# Patient Record
Sex: Female | Born: 1960 | Race: White | Hispanic: No | Smoking: Former smoker
Health system: Southern US, Community
[De-identification: ages and names within clinical notes are randomized; demographics above are authoritative.]

## PROBLEM LIST (undated history)

## (undated) DIAGNOSIS — M797 Fibromyalgia: Secondary | ICD-10-CM

## (undated) DIAGNOSIS — G4730 Sleep apnea, unspecified: Secondary | ICD-10-CM

## (undated) DIAGNOSIS — D649 Anemia, unspecified: Secondary | ICD-10-CM

## (undated) DIAGNOSIS — I2510 Atherosclerotic heart disease of native coronary artery without angina pectoris: Secondary | ICD-10-CM

## (undated) DIAGNOSIS — F419 Anxiety disorder, unspecified: Secondary | ICD-10-CM

## (undated) DIAGNOSIS — M1990 Unspecified osteoarthritis, unspecified site: Secondary | ICD-10-CM

## (undated) DIAGNOSIS — I10 Essential (primary) hypertension: Secondary | ICD-10-CM

## (undated) DIAGNOSIS — I509 Heart failure, unspecified: Secondary | ICD-10-CM

## (undated) DIAGNOSIS — K219 Gastro-esophageal reflux disease without esophagitis: Secondary | ICD-10-CM

## (undated) DIAGNOSIS — J449 Chronic obstructive pulmonary disease, unspecified: Secondary | ICD-10-CM

## (undated) DIAGNOSIS — E119 Type 2 diabetes mellitus without complications: Secondary | ICD-10-CM

## (undated) DIAGNOSIS — I639 Cerebral infarction, unspecified: Secondary | ICD-10-CM

## (undated) DIAGNOSIS — I739 Peripheral vascular disease, unspecified: Secondary | ICD-10-CM

## (undated) HISTORY — DX: Chronic obstructive pulmonary disease, unspecified: J44.9

## (undated) HISTORY — DX: Atherosclerotic heart disease of native coronary artery without angina pectoris: I25.10

## (undated) HISTORY — DX: Unspecified osteoarthritis, unspecified site: M19.90

## (undated) HISTORY — DX: Heart failure, unspecified: I50.9

## (undated) HISTORY — PX: DILATION AND CURETTAGE OF UTERUS: SHX78

## (undated) HISTORY — DX: Gastro-esophageal reflux disease without esophagitis: K21.9

## (undated) HISTORY — DX: Anxiety disorder, unspecified: F41.9

## (undated) HISTORY — DX: Cerebral infarction, unspecified: I63.9

## (undated) HISTORY — DX: Fibromyalgia: M79.7

## (undated) HISTORY — DX: Sleep apnea, unspecified: G47.30

## (undated) HISTORY — DX: Anemia, unspecified: D64.9

## (undated) HISTORY — DX: Essential (primary) hypertension: I10

## (undated) HISTORY — DX: Type 2 diabetes mellitus without complications: E11.9

## (undated) HISTORY — PX: TUBAL LIGATION: SHX77

## (undated) HISTORY — PX: CORONARY ANGIOPLASTY WITH STENT PLACEMENT: SHX49

## (undated) HISTORY — DX: Peripheral vascular disease, unspecified: I73.9

---

## 2004-08-08 ENCOUNTER — Other Ambulatory Visit: Payer: Self-pay

## 2004-08-08 ENCOUNTER — Inpatient Hospital Stay: Payer: Self-pay | Admitting: Internal Medicine

## 2004-08-09 ENCOUNTER — Other Ambulatory Visit: Payer: Self-pay

## 2004-11-10 ENCOUNTER — Ambulatory Visit: Payer: Self-pay

## 2007-02-10 ENCOUNTER — Emergency Department: Payer: Self-pay | Admitting: Emergency Medicine

## 2007-02-10 ENCOUNTER — Other Ambulatory Visit: Payer: Self-pay

## 2008-03-10 ENCOUNTER — Ambulatory Visit: Payer: Self-pay | Admitting: Gastroenterology

## 2008-08-13 ENCOUNTER — Emergency Department: Payer: Self-pay | Admitting: Internal Medicine

## 2008-10-06 ENCOUNTER — Inpatient Hospital Stay: Payer: Self-pay | Admitting: Internal Medicine

## 2008-12-04 ENCOUNTER — Ambulatory Visit: Payer: Self-pay | Admitting: Family Medicine

## 2010-07-21 ENCOUNTER — Ambulatory Visit: Payer: Self-pay | Admitting: Family Medicine

## 2014-01-24 ENCOUNTER — Emergency Department: Payer: Self-pay | Admitting: Emergency Medicine

## 2014-01-24 LAB — COMPREHENSIVE METABOLIC PANEL
ALT: 27 U/L (ref 12–78)
Albumin: 3.3 g/dL — ABNORMAL LOW (ref 3.4–5.0)
Alkaline Phosphatase: 94 U/L
Anion Gap: 9 (ref 7–16)
BUN: 14 mg/dL (ref 7–18)
Bilirubin,Total: 0.2 mg/dL (ref 0.2–1.0)
CO2: 27 mmol/L (ref 21–32)
Calcium, Total: 9.1 mg/dL (ref 8.5–10.1)
Chloride: 102 mmol/L (ref 98–107)
Creatinine: 0.71 mg/dL (ref 0.60–1.30)
EGFR (African American): >60
Glucose: 224 mg/dL — ABNORMAL HIGH (ref 65–99)
Osmolality: 283 (ref 275–301)
POTASSIUM: 3.9 mmol/L (ref 3.5–5.1)
SGOT(AST): 14 U/L — ABNORMAL LOW (ref 15–37)
Sodium: 138 mmol/L (ref 136–145)
TOTAL PROTEIN: 7.8 g/dL (ref 6.4–8.2)

## 2014-01-24 LAB — URINALYSIS, COMPLETE
BACTERIA: NONE SEEN
Bilirubin,UR: NEGATIVE
Blood: NEGATIVE
Glucose,UR: NEGATIVE mg/dL (ref 0–75)
Ketone: NEGATIVE
Leukocyte Esterase: NEGATIVE
Nitrite: NEGATIVE
Ph: 5 (ref 4.5–8.0)
Protein: NEGATIVE
RBC,UR: <1 /HPF (ref 0–5)
Specific Gravity: 1.006 (ref 1.003–1.030)
Squamous Epithelial: 1
WBC UR: 1 /HPF (ref 0–5)

## 2014-01-24 LAB — CBC
HCT: 36.8 % (ref 35.0–47.0)
HGB: 11.9 g/dL — ABNORMAL LOW (ref 12.0–16.0)
MCH: 26.3 pg (ref 26.0–34.0)
MCHC: 32.3 g/dL (ref 32.0–36.0)
MCV: 82 fL (ref 80–100)
Platelet: 180 10*3/uL (ref 150–440)
RBC: 4.5 10*6/uL (ref 3.80–5.20)
RDW: 16.4 % — ABNORMAL HIGH (ref 11.5–14.5)
WBC: 10.2 10*3/uL (ref 3.6–11.0)

## 2014-02-18 ENCOUNTER — Ambulatory Visit: Payer: Self-pay | Admitting: Primary Care

## 2014-03-17 ENCOUNTER — Ambulatory Visit: Payer: Self-pay | Admitting: Vascular Surgery

## 2014-03-17 LAB — BASIC METABOLIC PANEL
Anion Gap: 6 — ABNORMAL LOW (ref 7–16)
BUN: 32 mg/dL — AB (ref 7–18)
CALCIUM: 9.6 mg/dL (ref 8.5–10.1)
CHLORIDE: 103 mmol/L (ref 98–107)
Co2: 30 mmol/L (ref 21–32)
Creatinine: 1.61 mg/dL — ABNORMAL HIGH (ref 0.60–1.30)
EGFR (African American): 42 — ABNORMAL LOW
GFR CALC NON AF AMER: 36 — AB
Glucose: 202 mg/dL — ABNORMAL HIGH (ref 65–99)
Osmolality: 290 (ref 275–301)
Potassium: 4.1 mmol/L (ref 3.5–5.1)
Sodium: 139 mmol/L (ref 136–145)

## 2014-03-19 ENCOUNTER — Ambulatory Visit: Payer: Self-pay | Admitting: Vascular Surgery

## 2014-08-08 ENCOUNTER — Inpatient Hospital Stay: Payer: Self-pay | Admitting: Internal Medicine

## 2014-08-08 LAB — COMPREHENSIVE METABOLIC PANEL
ALBUMIN: 3.1 g/dL — AB (ref 3.4–5.0)
ALK PHOS: 87 U/L
ALT: 27 U/L
Anion Gap: 11 (ref 7–16)
BUN: 24 mg/dL — ABNORMAL HIGH (ref 7–18)
Bilirubin,Total: 0.2 mg/dL (ref 0.2–1.0)
CO2: 26 mmol/L (ref 21–32)
CREATININE: 1.22 mg/dL (ref 0.60–1.30)
Calcium, Total: 8.1 mg/dL — ABNORMAL LOW (ref 8.5–10.1)
Chloride: 100 mmol/L (ref 98–107)
EGFR (African American): 59 — ABNORMAL LOW
GFR CALC NON AF AMER: 49 — AB
GLUCOSE: 358 mg/dL — AB (ref 65–99)
Osmolality: 292 (ref 275–301)
POTASSIUM: 4.3 mmol/L (ref 3.5–5.1)
SGOT(AST): 21 U/L (ref 15–37)
SODIUM: 137 mmol/L (ref 136–145)
Total Protein: 6.9 g/dL (ref 6.4–8.2)

## 2014-08-08 LAB — PROTIME-INR
INR: 1.1
Prothrombin Time: 13.9 secs (ref 11.5–14.7)

## 2014-08-08 LAB — CBC
HCT: 33.6 % — AB (ref 35.0–47.0)
HGB: 10.8 g/dL — ABNORMAL LOW (ref 12.0–16.0)
MCH: 26.8 pg (ref 26.0–34.0)
MCHC: 32.1 g/dL (ref 32.0–36.0)
MCV: 84 fL (ref 80–100)
PLATELETS: 171 10*3/uL (ref 150–440)
RBC: 4.02 10*6/uL (ref 3.80–5.20)
RDW: 15.7 % — ABNORMAL HIGH (ref 11.5–14.5)
WBC: 9.7 10*3/uL (ref 3.6–11.0)

## 2014-08-09 LAB — BASIC METABOLIC PANEL
Anion Gap: 6 — ABNORMAL LOW (ref 7–16)
BUN: 18 mg/dL (ref 7–18)
CHLORIDE: 105 mmol/L (ref 98–107)
CO2: 29 mmol/L (ref 21–32)
CREATININE: 1.14 mg/dL (ref 0.60–1.30)
Calcium, Total: 8 mg/dL — ABNORMAL LOW (ref 8.5–10.1)
EGFR (Non-African Amer.): 53 — ABNORMAL LOW
GLUCOSE: 138 mg/dL — AB (ref 65–99)
Osmolality: 283 (ref 275–301)
Potassium: 3.7 mmol/L (ref 3.5–5.1)
SODIUM: 140 mmol/L (ref 136–145)

## 2014-08-09 LAB — HEMOGLOBIN
HGB: 10.6 g/dL — ABNORMAL LOW (ref 12.0–16.0)
HGB: 10.8 g/dL — AB (ref 12.0–16.0)

## 2014-08-09 LAB — MAGNESIUM: Magnesium: 1.2 mg/dL — ABNORMAL LOW

## 2014-12-01 ENCOUNTER — Ambulatory Visit: Payer: Self-pay | Admitting: Primary Care

## 2014-12-11 ENCOUNTER — Ambulatory Visit: Payer: Self-pay | Admitting: Primary Care

## 2014-12-29 ENCOUNTER — Ambulatory Visit
Admit: 2014-12-29 | Disposition: A | Discharge status: Home / Self Care | Payer: Self-pay | Attending: Vascular Surgery | Admitting: Vascular Surgery

## 2014-12-30 DIAGNOSIS — G4733 Obstructive sleep apnea (adult) (pediatric): Secondary | ICD-10-CM

## 2015-01-17 NOTE — Discharge Summary (Signed)
PATIENT NAME:  [name], VOORHIES MR#:  [id number] DATE OF BIRTH:  1960-10-13  DATE OF ADMISSION:  08/08/2014 DATE OF DISCHARGE:  08/09/2014  ADMITTING PHYSICIAN: Dr. Bridgett Larsson  DISCHARGING PHYSICIAN: Gladstone Lighter, MD  PRIMARY CARE PHYSICIAN: Ms. Freddy Finner  DISCHARGE DIAGNOSES:  1.  Rectal bleed, likely hemorrhoidal. 2.  Hypertension. 3.  Diabetes.  4.  Obstructive sleep apnea, on CPAP.  5.  Morbid obesity. 6.  Hypomagnesemia.  DISCHARGE HOME MEDICATIONS: 1.  Januvia 100 mg p.o. daily.  2.  Aspirin 81 mg p.o. daily.  3.  Lasix 40 mg p.o. b.i.d.  4.  Metformin 1000 mg p.o. b.i.d.  5.  Plavix 75 mg p.o. daily. 6.  Cetirizine 10 mg p.o. daily.  7.  Vitamin D3 1,000 international units 1 capsule daily. 8.  Enalapril 20 mg p.o. daily.  9.  Protonix 40 mg daily.  10.  Lantus 80 units subcutaneous once a day in the evening. 11.  Glipizide 10 mg p.o. b.i.d.  12.  Simvastatin 40 mg p.o. daily.  13.  Coreg 12.5 mg p.o. b.i.d.  14.  Sertraline 100 mg 1-1/2 tablets daily.  15.  Flexeril 10 mg p.o. at bedtime as needed for back pain.  16.  Trazodone 50 mg 1 to 2 tablets orally at bedtime as needed for sleep.  17.  Fluconazole 150 mg once a week on Tuesdays for 8 weeks. 18.  Colace 100 mg p.o. b.i.d. for constipation.   DISCHARGE DIET: Low-sodium, ADA 1800 calorie.  DISCHARGE ACTIVITY: As tolerated.    FOLLOWUP INSTRUCTIONS: 1.  Follow up with GI, Dr. Allen Norris, in 1 week.  2.  PCP follow-up in 1 to 2 weeks.   DIAGNOSTIC DATA: Prior to discharge:  Hemoglobin has remained stable at 10.8, hematocrit 33.6, WBC 9.7, platelet count 171,000. Sodium 140, potassium 3.7, chloride 105, bicarb 29, BUN 18, creatinine 1.14, glucose 158, and calcium 8.   ALT 27, AST 21, alk phos 87, total bili 0.2, albumin 3.1. INR 1.1.   BRIEF HOSPITAL COURSE:  1.  [name] is a 54 year old obese Caucasian female with past medical history significant for hypertension, diabetes, and sleep apnea who came to the  hospital secondary to 2 days of history of rectal bleeding. The patient says she noticed blood on the toilet paper and also blood around the stool, not mixed inside the stool. Quantifies the  amount as only small amounts of blood. Her last colonoscopy was in 2009 which showed colitis at the time and was treated. The patient was admitted. She has not had further bloody bowel movements. She also complains of constipation recently. She is on stool softeners at this time in the hospital. Discussed the case with Dr. Allen Norris who would like to see the patient as an outpatient in 1 week. The patient really wanted to go home as well too. Hemoglobin has remained stable so likely could have been hemorrhoidal bleed or diverticulosis, but since bleeding stopped, since hemoglobin is stable, she is being discharged with outpatient follow-up. Stool softener is recommended at the time of discharge. 2.  Diabetes mellitus. The patient is on Januvia. The patient is on Lantus and glipizide which will be continued without any changes.   3.  Hypertension. The patient is on Lasix, on enalapril, and Coreg. 4.  Fibromyalgia. Continued her on her home pain medications.  Her course has been otherwise uneventful in the hospital.   DISCHARGE CONDITION: Stable.   DISCHARGE DISPOSITION: Home.   TIME SPENT ON DISCHARGE: 40 minutes.  ____________________________ Gladstone Lighter, MD rk:sb D: 08/11/2014 12:35:57 ET T: 08/11/2014 13:51:09 ET JOB#: [id number]  cc: Gladstone Lighter, MD, <Dictator> Lucilla Lame, MD Freddy Finner, NP  Gladstone Lighter MD ELECTRONICALLY SIGNED 08/16/2014 15:02

## 2015-01-17 NOTE — H&P (Signed)
PATIENT NAME:  [name], [name] MR#:  [id number] DATE OF BIRTH:  12-May-1961  DATE OF ADMISSION:  08/08/2014  PRIMARY CARE PHYSICIAN: Dr. Seward Carolubio.   CHIEF COMPLAINT: Rectal bleeding for 7 to 10 days.   HISTORY OF PRESENT ILLNESS: A 54 year old Caucasian female with a history of hypertension, diabetes, obesity, came to ED for rectal bleeding for the past 7 to 10 days. The patient is alert, awake and oriented, in no acute distress. The patient said that for the past 7 to 10 days she has had bright bloody stool on and off. She denies any melena or hematuria. The patient feels weak, but denies any chest pain, palpitation. The patient is taking aspirin, Plavix, but denies any NSAID intake.   PAST MEDICAL HISTORY: Hypertension, diabetes, OSA on CPAP, morbid obesity.   SOCIAL HISTORY: Quit smoking this June. No alcohol drinking. No illicit drugs.   FAMILY HISTORY: Hypertension, diabetes.   ALLERGIES: None.   HOME MEDICATIONS: Aspirin 81 mg p.o. daily, Coreg 12.5 mg p.o. b.i.d., cetirizine 10 mg p.o. daily, Plavix 75 mg p.o. daily, cyclobenzaprine 10 mg p.o. at bedtime p.r.n. for back pain, diclofenac sodium 75 mg p.o. twice a day p.r.n. for pain, enalapril 20 mg p.o. daily, fluconazole 150 mg p.o. once a week on Tuesday for 8 weeks, Lasix 40 mg p.o. b.i.d., glipizide 10 mg p.o. b.i.d., Januvia 100 mg p.o. once a day, Lantus 80 units subcutaneous in the evening, metformin 1000 mg p.o. b.i.d., Protonix 40 mg p.o. daily, sertraline 100 mg p.o. 1.5 tablets once a day, Zocor 40 mg p.o. at bedtime, trazodone 50 mg p.o. 1 to 2 tablets at bedtime p.r.n. for sleep, vitamin D3 at 1000 international units 1 cap once a day.   REVIEW OF SYSTEMS:  CONSTITUTIONAL: The patient denies any fever or chills. No headache. No dizziness, but has generalized weakness.  EYES: No double vision or blurred vision.  ENT: No potential drip, slurred speech or dysphagia.  CARDIOVASCULAR: No chest pain, palpitation, orthopnea, nocturnal  dyspnea. No leg edema.  PULMONARY: No cough, sputum, shortness of breath, or hemoptysis.  GASTROINTESTINAL: No abdominal pain, nausea, vomiting, but has diarrhea and bloody stool. Denies any melena.  GENITOURINARY: No dysuria, hematuria, or incontinence.  SKIN: No rash or jaundice.  NEUROLOGIC: No syncope, loss of consciousness, or seizure.  ENDOCRINOLOGY: No polyuria, polydipsia, heat or cold intolerance.  HEMATOLOGY: No easy bruising or bleeding, but has rectal bleeding.    PHYSICAL EXAMINATION: VITAL SIGNS: Temperature 98.1, blood pressure 110/74, pulse 88, oxygen saturation 100% on room air.  GENERAL: The patient is alert, awake, oriented, in no acute distress, obese.  HEENT: Pupils round, equal, and reactive to light and accommodation.  NECK: Supple. No JVD or carotid bruit. No lymphadenopathy. No thyromegaly.  CARDIOVASCULAR: S1, S2 regular rate and rhythm. No murmurs or gallops.  PULMONARY: Bilateral air entry. No wheezing or rales. No use of accessory muscle to breathe.  ABDOMEN: Soft. No distention or tenderness. No organomegaly. Bowel sounds present. Obese.  EXTREMITIES: No edema, clubbing or cyanosis. No calf tenderness. Bilateral pedal pulses present.  SKIN: No rash or jaundice.  NEUROLOGIC: A and O x 3. No focal deficit. Power 5/5. Sensory intact.   LABORATORY DATA: WBC 9.7, hemoglobin 10.8 and was 11.9 in May 2015, platelets 171,000. Glucose 358, BUN 24, creatinine 1.22. Electrolytes normal. INR 1.1.   IMPRESSIONS:  1.  Rectal bleeding.  2.  Anemia.  3.  Hypertension.  4.  Diabetes.  5.  Morbid obesity.  6.  Obstructive sleep apnea, on CPAP.   PLAN OF TREATMENT:  1.  The patient will be admitted to the medical floor. We will hold aspirin, Plavix, and diclofenac. Monitor hemoglobin every 8 hours. We will start Protonix IV b.i.d. and follow up a GI consult for possible endoscopy.  2.  We will keep n.p.o. except the medications.  3.  For hypertension, continue the  patient's home p.o. medication.  4.  For diabetes, we will hold glipizide and Januvia, start a sliding scale, and give Levemir 40 units subcutaneous in the evening, which is a half dose of the patient's home Lantus dose. Will monitor blood sugar closely and start hypoglycemia protocol.  5.  IV fluid support.  6.  I discussed the patient's condition and plan of treatment with the patient.   TIME SPENT: About 32 minutes.    ____________________________ Shaune Pollack, MD qc:at D: 08/08/2014 20:39:05 ET T: 08/08/2014 21:12:44 ET JOB#: [id number]  cc: Shaune Pollack, MD, <Dictator> Shaune Pollack MD ELECTRONICALLY SIGNED 08/11/2014 23:35

## 2015-01-17 NOTE — Op Note (Signed)
PATIENT NAME:  [name], [name] MR#:  [id number] DATE OF BIRTH:  19-Feb-1961  DATE OF PROCEDURE:  03/19/2014  PREOPERATIVE DIAGNOSES:  1. Bilateral carotid artery stenosis.  2. 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  3. Chronic kidney disease.  4. Morbid obesity.  5. Hypertension.  6. Diabetes.   POSTOPERATIVE DIAGNOSES:  1. Bilateral carotid artery stenosis.  2. 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  3. Chronic kidney disease.  4. Morbid obesity.  5. Hypertension.  6. Diabetes.   PROCEDURES:  1. Ultrasound guidance for vascular access to right femoral artery.  2. Catheter placement into right subclavian artery, right common carotid artery, and left common carotid artery from right femoral approach.  3. Thoracic aortogram.  4. Selective left cervical and cerebral carotid angiogram.  5. Selective right cervical carotid angiogram.  6. Right subclavian angiogram.  7. Percutaneous transluminal angioplasty of proximal right subclavian artery for high-grade stenosis with 6, 7, and 8 mm diameter angioplasty balloons.  8. StarClose closure of  right femoral artery.   SURGEON: Annice Needy, MD    ANESTHESIA: Local with moderate conscious sedation.   BLOOD LOSS: 25 mL.   CONTRAST USED: 100 mL Visipaque.   FLUOROSCOPY TIME: 11.4 minutes.   INDICATION FOR PROCEDURE: This is a 54 year old female with the above-mentioned issues. She was referred with an ultrasound.  She has a CKD which precludes CT angiogram, and given her size and the subclavian lesion, this would be difficult to assess with CT angiogram. Catheter-based angiogram is performed for further evaluation. Risks and benefits were discussed. Informed consent was obtained.   DESCRIPTION OF PROCEDURE: The patient is brought to the vascular suite. Groins were shaved and prepped and a sterile surgical field was created. The right femoral artery was visualized with ultrasound. The right  femoral head was localized with fluoroscopy. I accessed the right femoral artery under direct ultrasound guidance without difficulty with a Seldinger needle and a permanent image was recorded. A 5-French sheath was then placed and the patient was given 4000 units of intravenous heparin. An additional 2000 units were given later in the procedure. Pigtail catheter was placed in the ascending aorta and an LAO projection thoracic aortogram was performed. This showed normal left subclavian artery with a small vertebral artery. Normal origin of the left common carotid artery. The innominate artery showed brisk flow into the carotid artery, but the right subclavian artery filled late and appeared to be through a retrograde filling of a small vertebral artery. I  started by cannulating the left common carotid artery with a Headhunter catheter and advancing into the mid left common carotid artery. Cervical and cerebral carotid angiogram was then performed. Multiple views were performed to help delineate the degree of stenosis and about a 70% to 75% left ICA stenosis just beyond its origin was seen. The intracranial filling was brisk to both the anterior and middle cerebral arteries without significant deficits. I then cannulated the innominate artery with Headhunter catheter. I advanced into the right common carotid artery and selective imaging was performed of the cervical carotid artery, which showed only about a 25% to 30% stenosis of the cervical internal carotid artery. The catheter was pulled back in the innominate artery. We were able to identify very high-grade stenosis of the right subclavian artery at its origin, just beyond the origin of the common carotid artery. This was greater than 90%. I was able to cross the lesion with a mild amount of difficulty with  a stiff-angled Glidewire and the Headhunter catheter and confirm intraluminal flow in the subclavian and axillary artery distal to the lesion.  I performed  pressure measurements of the right subclavian artery.  It had pressures only in the 50s to 60s systolic over 30 diastolic.  The pressures, when pulled back into the innominate artery and then when measured off a sheath after its placement, were in the 130s systolic over 60 diastolic range. There was a 60 mm to 70 mm mercury gradient. At this point, a 6-French 70 cm long sheath was placed into the innominate artery and a Terumo Advantage wire had been left across the lesion after catheter placement and removal. Through this sheath, I treated the lesion initially with a 6 mm diameter angioplasty balloon. There was still significant residual stenosis.  This was in a location that was suboptimal for stent placement. The lesion was just beyond the origin of the carotid artery and there was only about 2 cm until the origin of the vertebral artery. There was also poststenotic dilatation in the subclavian artery making size match difficult.  I proceeded to perform angioplasty with a 7 mm and then an 8 mm diameter angioplasty balloon, and at this point, there was markedly improved flow with only about a 30% residual stenosis seen. I was pleased with this result and elected not to place a stent. The sheath was then pulled back to the ipsilateral external iliac artery. An oblique arteriogram was performed. StarClose closure device was deployed in the usual fashion with excellent hemostatic result. The patient tolerated the procedure well and was taken to the recovery room in stable condition.    ____________________________ Annice NeedyJason S. Dew, MD jsd:dd/am D: 03/19/2014 15:34:09 ET T: 03/20/2014 02:05:07 ET JOB#: [id number]  cc: Annice NeedyJason S. Dew, MD, <Dictator> Sandrea HughsJessica Rubio, NP  Annice NeedyJASON S DEW MD ELECTRONICALLY SIGNED 03/31/2014 15:07

## 2015-01-22 ENCOUNTER — Ambulatory Visit: Payer: Self-pay

## 2015-01-22 ENCOUNTER — Ambulatory Visit
Admit: 2015-01-22 | Disposition: A | Discharge status: Home / Self Care | Payer: Self-pay | Attending: Vascular Surgery | Admitting: Vascular Surgery

## 2015-01-22 LAB — BASIC METABOLIC PANEL
Anion Gap: 7 (ref 7–16)
BUN: 12 mg/dL
CHLORIDE: 101 mmol/L
Calcium, Total: 9.2 mg/dL
Co2: 29 mmol/L
Creatinine: 0.81 mg/dL
EGFR (Non-African Amer.): >60
Glucose: 171 mg/dL — ABNORMAL HIGH
Potassium: 4.2 mmol/L
SODIUM: 137 mmol/L

## 2015-01-22 LAB — CBC
HCT: 31.9 % — ABNORMAL LOW (ref 35.0–47.0)
HGB: 10.1 g/dL — ABNORMAL LOW (ref 12.0–16.0)
MCH: 24.3 pg — AB (ref 26.0–34.0)
MCHC: 31.7 g/dL — ABNORMAL LOW (ref 32.0–36.0)
MCV: 77 fL — ABNORMAL LOW (ref 80–100)
PLATELETS: 191 10*3/uL (ref 150–440)
RBC: 4.16 10*6/uL (ref 3.80–5.20)
RDW: 16.1 % — ABNORMAL HIGH (ref 11.5–14.5)
WBC: 10.2 10*3/uL (ref 3.6–11.0)

## 2015-01-22 LAB — PROTIME-INR
INR: 1.1
Prothrombin Time: 13.9 secs

## 2015-01-22 LAB — APTT: Activated PTT: 27.6 secs (ref 23.6–35.9)

## 2015-01-23 ENCOUNTER — Other Ambulatory Visit: Payer: Self-pay

## 2015-01-29 ENCOUNTER — Inpatient Hospital Stay
Admission: RE | Admit: 2015-01-29 | Discharge: 2015-01-30 | DRG: 039 | Disposition: A | Discharge status: Home / Self Care | Payer: Medicaid Other | Source: Ambulatory Visit | Attending: Vascular Surgery | Admitting: Vascular Surgery

## 2015-01-29 ENCOUNTER — Other Ambulatory Visit: Payer: Self-pay | Admitting: Vascular Surgery

## 2015-01-29 ENCOUNTER — Inpatient Hospital Stay: Payer: Medicaid Other | Admitting: Anesthesiology

## 2015-01-29 ENCOUNTER — Encounter
Admission: RE | Disposition: A | Discharge status: Home / Self Care | Payer: Self-pay | Source: Ambulatory Visit | Attending: Vascular Surgery

## 2015-01-29 DIAGNOSIS — G4730 Sleep apnea, unspecified: Secondary | ICD-10-CM | POA: Diagnosis present

## 2015-01-29 DIAGNOSIS — Z9851 Tubal ligation status: Secondary | ICD-10-CM | POA: Diagnosis not present

## 2015-01-29 DIAGNOSIS — K219 Gastro-esophageal reflux disease without esophagitis: Secondary | ICD-10-CM | POA: Diagnosis present

## 2015-01-29 DIAGNOSIS — M797 Fibromyalgia: Secondary | ICD-10-CM | POA: Diagnosis present

## 2015-01-29 DIAGNOSIS — E669 Obesity, unspecified: Secondary | ICD-10-CM | POA: Diagnosis present

## 2015-01-29 DIAGNOSIS — J449 Chronic obstructive pulmonary disease, unspecified: Secondary | ICD-10-CM | POA: Diagnosis present

## 2015-01-29 DIAGNOSIS — I739 Peripheral vascular disease, unspecified: Secondary | ICD-10-CM | POA: Diagnosis present

## 2015-01-29 DIAGNOSIS — Z955 Presence of coronary angioplasty implant and graft: Secondary | ICD-10-CM | POA: Diagnosis not present

## 2015-01-29 DIAGNOSIS — I10 Essential (primary) hypertension: Secondary | ICD-10-CM | POA: Diagnosis present

## 2015-01-29 DIAGNOSIS — I509 Heart failure, unspecified: Secondary | ICD-10-CM | POA: Diagnosis present

## 2015-01-29 DIAGNOSIS — I2510 Atherosclerotic heart disease of native coronary artery without angina pectoris: Secondary | ICD-10-CM | POA: Diagnosis present

## 2015-01-29 DIAGNOSIS — Z8673 Personal history of transient ischemic attack (TIA), and cerebral infarction without residual deficits: Secondary | ICD-10-CM | POA: Diagnosis not present

## 2015-01-29 DIAGNOSIS — I6522 Occlusion and stenosis of left carotid artery: Principal | ICD-10-CM | POA: Diagnosis present

## 2015-01-29 DIAGNOSIS — E119 Type 2 diabetes mellitus without complications: Secondary | ICD-10-CM | POA: Diagnosis present

## 2015-01-29 DIAGNOSIS — M1990 Unspecified osteoarthritis, unspecified site: Secondary | ICD-10-CM | POA: Diagnosis present

## 2015-01-29 DIAGNOSIS — I6529 Occlusion and stenosis of unspecified carotid artery: Secondary | ICD-10-CM | POA: Diagnosis present

## 2015-01-29 HISTORY — PX: ENDARTERECTOMY: SHX5162

## 2015-01-29 LAB — GLUCOSE, CAPILLARY: GLUCOSE-CAPILLARY: 224 mg/dL — AB (ref 70–99)

## 2015-01-29 SURGERY — ENDARTERECTOMY, CAROTID
Anesthesia: General | Site: Neck | Laterality: Left | Wound class: Clean

## 2015-01-29 MED ORDER — ASPIRIN EC 81 MG PO TBEC
81.0000 mg | DELAYED_RELEASE_TABLET | Freq: Every day | ORAL | Status: DC
  Administered 2015-01-29 – 2015-01-30 (×2): 81 mg via ORAL
  Filled 2015-01-29 (×2): qty 1

## 2015-01-29 MED ORDER — FENTANYL CITRATE (PF) 100 MCG/2ML IJ SOLN
INTRAMUSCULAR | Status: DC | PRN
  Administered 2015-01-29: 100 ug via INTRAVENOUS

## 2015-01-29 MED ORDER — SODIUM CHLORIDE 0.9 % IV SOLN
INTRAVENOUS | Status: DC | PRN
  Administered 2015-01-29: 50 mL

## 2015-01-29 MED ORDER — FENTANYL CITRATE (PF) 100 MCG/2ML IJ SOLN
INTRAMUSCULAR | Status: AC
Start: 2015-01-29 — End: 2015-01-29
  Administered 2015-01-29: 25 ug via INTRAVENOUS
  Filled 2015-01-29: qty 2

## 2015-01-29 MED ORDER — LIDOCAINE HCL 1 % IJ SOLN
INTRAMUSCULAR | Status: DC | PRN
  Administered 2015-01-29: 10 mL via INTRADERMAL

## 2015-01-29 MED ORDER — DEXAMETHASONE SODIUM PHOSPHATE 4 MG/ML IJ SOLN
INTRAMUSCULAR | Status: DC | PRN
  Administered 2015-01-29: 5 mg via INTRAVENOUS

## 2015-01-29 MED ORDER — CEFAZOLIN SODIUM-DEXTROSE 2-3 GM-% IV SOLR
2.0000 g | Freq: Once | INTRAVENOUS | Status: AC
  Administered 2015-01-29: 2 g via INTRAVENOUS

## 2015-01-29 MED ORDER — FENTANYL CITRATE (PF) 100 MCG/2ML IJ SOLN
25.0000 ug | Freq: Once | INTRAMUSCULAR | Status: AC
  Administered 2015-01-29: 25 ug via INTRAVENOUS

## 2015-01-29 MED ORDER — LIDOCAINE HCL (CARDIAC) 20 MG/ML IV SOLN
INTRAVENOUS | Status: DC | PRN
  Administered 2015-01-29: 50 mg via INTRAVENOUS

## 2015-01-29 MED ORDER — SODIUM CHLORIDE 0.9 % IV SOLN
1000.0000 ug | INTRAVENOUS | Status: DC | PRN
  Administered 2015-01-29: 0.0125 ug/kg/min via INTRAVENOUS

## 2015-01-29 MED ORDER — SODIUM CHLORIDE 0.9 % IV SOLN: 500.0000 mL | Freq: Once | INTRAVENOUS | Status: AC | PRN

## 2015-01-29 MED ORDER — ROCURONIUM BROMIDE 100 MG/10ML IV SOLN
INTRAVENOUS | Status: DC | PRN
  Administered 2015-01-29: 20 mg via INTRAVENOUS
  Administered 2015-01-29: 10 mg via INTRAVENOUS
  Administered 2015-01-29: 20 mg via INTRAVENOUS

## 2015-01-29 MED ORDER — OXYCODONE-ACETAMINOPHEN 5-325 MG PO TABS
1.0000 | ORAL_TABLET | ORAL | Status: DC | PRN
  Administered 2015-01-29 (×2): 1 via ORAL
  Filled 2015-01-29 (×2): qty 1

## 2015-01-29 MED ORDER — HEPARIN SODIUM (PORCINE) 1000 UNIT/ML IJ SOLN
INTRAMUSCULAR | Status: AC
  Administered 2015-01-29: 6000 [IU] via INTRAVENOUS
  Filled 2015-01-29: qty 1

## 2015-01-29 MED ORDER — NEOSTIGMINE METHYLSULFATE 10 MG/10ML IV SOLN
INTRAVENOUS | Status: DC | PRN
  Administered 2015-01-29: 4 mg via INTRAVENOUS

## 2015-01-29 MED ORDER — PROPOFOL 10 MG/ML IV BOLUS
INTRAVENOUS | Status: DC | PRN
  Administered 2015-01-29: 200 mg via INTRAVENOUS

## 2015-01-29 MED ORDER — HYDRALAZINE HCL 20 MG/ML IJ SOLN: 5.0000 mg | INTRAMUSCULAR | Status: DC | PRN

## 2015-01-29 MED ORDER — MORPHINE SULFATE 2 MG/ML IJ SOLN
2.0000 mg | INTRAMUSCULAR | Status: DC | PRN
  Administered 2015-01-29 – 2015-01-30 (×2): 2 mg via INTRAVENOUS
  Filled 2015-01-29 (×2): qty 1

## 2015-01-29 MED ORDER — METOPROLOL TARTRATE 1 MG/ML IV SOLN: 2.0000 mg | INTRAVENOUS | Status: DC | PRN

## 2015-01-29 MED ORDER — ALUM & MAG HYDROXIDE-SIMETH 200-200-20 MG/5ML PO SUSP: 15.0000 mL | ORAL | Status: DC | PRN

## 2015-01-29 MED ORDER — SUCCINYLCHOLINE CHLORIDE 20 MG/ML IJ SOLN
INTRAMUSCULAR | Status: DC | PRN
  Administered 2015-01-29: 100 mg via INTRAVENOUS

## 2015-01-29 MED ORDER — MAGNESIUM SULFATE 2 GM/50ML IV SOLN
2.0000 g | Freq: Every day | INTRAVENOUS | Status: DC | PRN
  Filled 2015-01-29: qty 50

## 2015-01-29 MED ORDER — NITROGLYCERIN IN D5W 200-5 MCG/ML-% IV SOLN
INTRAVENOUS | Status: AC
  Filled 2015-01-29: qty 250

## 2015-01-29 MED ORDER — ESMOLOL HCL-SODIUM CHLORIDE 2500 MG/250ML IV SOLN
25.0000 ug/kg/min | INTRAVENOUS | Status: DC
  Filled 2015-01-29: qty 250

## 2015-01-29 MED ORDER — PHENOL 1.4 % MT LIQD
1.0000 | OROMUCOSAL | Status: DC | PRN
  Filled 2015-01-29: qty 177

## 2015-01-29 MED ORDER — ACETAMINOPHEN 325 MG PO TABS: 325.0000 mg | ORAL_TABLET | ORAL | Status: DC | PRN

## 2015-01-29 MED ORDER — ESMOLOL HCL-SODIUM CHLORIDE 2000 MG/100ML IV SOLN
25.0000 ug/kg/min | INTRAVENOUS | Status: DC
  Filled 2015-01-29: qty 100

## 2015-01-29 MED ORDER — GLYCOPYRROLATE 0.2 MG/ML IJ SOLN
INTRAMUSCULAR | Status: DC | PRN
  Administered 2015-01-29: 0.2 mg via INTRAVENOUS
  Administered 2015-01-29: 0.6 mg via INTRAVENOUS

## 2015-01-29 MED ORDER — EPHEDRINE SULFATE 50 MG/ML IJ SOLN
INTRAMUSCULAR | Status: DC | PRN
  Administered 2015-01-29: 5 mg via INTRAVENOUS
  Administered 2015-01-29 (×2): 10 mg via INTRAVENOUS

## 2015-01-29 MED ORDER — SODIUM CHLORIDE 0.9 % IV SOLN
INTRAVENOUS | Status: DC
  Administered 2015-01-29 (×2): via INTRAVENOUS

## 2015-01-29 MED ORDER — CLOPIDOGREL BISULFATE 75 MG PO TABS
75.0000 mg | ORAL_TABLET | Freq: Every day | ORAL | Status: DC
  Administered 2015-01-30: 75 mg via ORAL
  Filled 2015-01-29: qty 1

## 2015-01-29 MED ORDER — CEFAZOLIN SODIUM 1-5 GM-% IV SOLN
1.0000 g | Freq: Four times a day (QID) | INTRAVENOUS | Status: AC
  Administered 2015-01-29 – 2015-01-30 (×2): 1 g via INTRAVENOUS
  Filled 2015-01-29 (×2): qty 50

## 2015-01-29 MED ORDER — ONDANSETRON HCL 4 MG/2ML IJ SOLN: 4.0000 mg | Freq: Four times a day (QID) | INTRAMUSCULAR | Status: DC | PRN

## 2015-01-29 MED ORDER — NITROGLYCERIN IN D5W 200-5 MCG/ML-% IV SOLN: 5.0000 ug/min | INTRAVENOUS | Status: DC

## 2015-01-29 MED ORDER — ONDANSETRON HCL 4 MG/2ML IJ SOLN: 4.0000 mg | Freq: Once | INTRAMUSCULAR | Status: DC | PRN

## 2015-01-29 MED ORDER — LABETALOL HCL 5 MG/ML IV SOLN: 10.0000 mg | INTRAVENOUS | Status: DC | PRN

## 2015-01-29 MED ORDER — FENTANYL CITRATE (PF) 100 MCG/2ML IJ SOLN
25.0000 ug | INTRAMUSCULAR | Status: DC | PRN
  Administered 2015-01-29 (×2): 25 ug via INTRAVENOUS

## 2015-01-29 MED ORDER — FENTANYL CITRATE (PF) 100 MCG/2ML IJ SOLN: 25.0000 ug | INTRAMUSCULAR | Status: DC

## 2015-01-29 MED ORDER — SODIUM CHLORIDE 0.9 % IV SOLN
INTRAVENOUS | Status: DC
  Administered 2015-01-29 (×2): via INTRAVENOUS

## 2015-01-29 MED ORDER — DOCUSATE SODIUM 100 MG PO CAPS
100.0000 mg | ORAL_CAPSULE | Freq: Every day | ORAL | Status: DC
  Administered 2015-01-30: 100 mg via ORAL
  Filled 2015-01-29: qty 1

## 2015-01-29 MED ORDER — FENTANYL CITRATE (PF) 100 MCG/2ML IJ SOLN
25.0000 ug | INTRAMUSCULAR | Status: AC | PRN
  Administered 2015-01-29 (×4): 25 ug via INTRAVENOUS

## 2015-01-29 MED ORDER — GUAIFENESIN-DM 100-10 MG/5ML PO SYRP: 15.0000 mL | ORAL_SOLUTION | ORAL | Status: DC | PRN

## 2015-01-29 MED ORDER — POTASSIUM CHLORIDE CRYS ER 20 MEQ PO TBCR
20.0000 meq | EXTENDED_RELEASE_TABLET | Freq: Every day | ORAL | Status: DC | PRN
Start: 2015-01-29 — End: 2015-01-30

## 2015-01-29 MED ORDER — CEFAZOLIN SODIUM-DEXTROSE 2-3 GM-% IV SOLR
INTRAVENOUS | Status: AC
  Filled 2015-01-29: qty 50

## 2015-01-29 MED ORDER — EVICEL 2 ML EX KIT
PACK | CUTANEOUS | Status: DC | PRN
  Administered 2015-01-29 (×2): 2 mL via TOPICAL

## 2015-01-29 MED ORDER — CEFAZOLIN SODIUM 1 G IJ SOLR
INTRAMUSCULAR | Status: AC
  Filled 2015-01-29: qty 10

## 2015-01-29 MED ORDER — PANTOPRAZOLE SODIUM 40 MG PO TBEC
40.0000 mg | DELAYED_RELEASE_TABLET | Freq: Every day | ORAL | Status: DC
  Administered 2015-01-29 – 2015-01-30 (×2): 40 mg via ORAL
  Filled 2015-01-29 (×2): qty 1

## 2015-01-29 MED ORDER — LIDOCAINE HCL (PF) 1 % IJ SOLN
INTRAMUSCULAR | Status: AC
  Filled 2015-01-29: qty 30

## 2015-01-29 MED ORDER — LACTATED RINGERS IV SOLN: INTRAVENOUS | Status: DC | PRN

## 2015-01-29 MED ORDER — ACETAMINOPHEN 650 MG RE SUPP: 325.0000 mg | RECTAL | Status: DC | PRN

## 2015-01-29 MED ORDER — ONDANSETRON HCL 4 MG/2ML IJ SOLN
INTRAMUSCULAR | Status: DC | PRN
  Administered 2015-01-29: 4 mg via INTRAVENOUS

## 2015-01-29 SURGICAL SUPPLY — 59 items
BAG DECANTER STRL (MISCELLANEOUS) ×3 IMPLANT
BLADE SURG 15 STRL LF DISP TIS (BLADE) ×1 IMPLANT
BLADE SURG 15 STRL SS (BLADE) ×2
BLADE SURG SZ11 CARB STEEL (BLADE) ×3 IMPLANT
BOOT SUTURE AID YELLOW STND (SUTURE) ×3 IMPLANT
BRUSH SCRUB 4% CHG (MISCELLANEOUS) ×3 IMPLANT
CANISTER SUCT 1200ML W/VALVE (MISCELLANEOUS) ×3 IMPLANT
CATH TRAY 16F METER LATEX (MISCELLANEOUS) ×3 IMPLANT
DRAPE INCISE IOBAN 66X45 STRL (DRAPES) ×3 IMPLANT
DRAPE PED LAPAROTOMY (DRAPES) ×3 IMPLANT
DRAPE SHEET LG 3/4 BI-LAMINATE (DRAPES) IMPLANT
DRSG TEGADERM 4X4.75 (GAUZE/BANDAGES/DRESSINGS) IMPLANT
DRSG TELFA 3X8 NADH (GAUZE/BANDAGES/DRESSINGS) IMPLANT
DURAPREP 26ML APPLICATOR (WOUND CARE) ×3 IMPLANT
ELECT CAUTERY BLADE 6.4 (BLADE) ×3 IMPLANT
EVICEL 5ML SEALNT HUMAN B (Miscellaneous) ×3 IMPLANT
GLOVE BIO SURGEON STRL SZ7 (GLOVE) ×21 IMPLANT
GOWN STRL REUS W/ TWL LRG LVL3 (GOWN DISPOSABLE) ×3 IMPLANT
GOWN STRL REUS W/ TWL XL LVL3 (GOWN DISPOSABLE) ×1 IMPLANT
GOWN STRL REUS W/TWL LRG LVL3 (GOWN DISPOSABLE) ×6
GOWN STRL REUS W/TWL XL LVL3 (GOWN DISPOSABLE) ×2
HEMOSTAT SURGICEL 2X3 (HEMOSTASIS) ×3 IMPLANT
IV NS 250ML (IV SOLUTION) ×2
IV NS 250ML BAXH (IV SOLUTION) ×1 IMPLANT
KIT RM TURNOVER STRD PROC AR (KITS) ×3 IMPLANT
LABEL OR SOLS (LABEL) IMPLANT
LIQUID BAND (GAUZE/BANDAGES/DRESSINGS) ×3 IMPLANT
LOOP RED MAXI  1X406MM (MISCELLANEOUS) ×4
LOOP VESSEL MAXI 1X406 RED (MISCELLANEOUS) ×2 IMPLANT
LOOP VESSEL MINI 0.8X406 BLUE (MISCELLANEOUS) ×1 IMPLANT
LOOPS BLUE MINI 0.8X406MM (MISCELLANEOUS) ×2
NDL SAFETY 25GX1.5 (NEEDLE) ×3 IMPLANT
NEEDLE FILTER BLUNT 18X 1/2SAF (NEEDLE) ×2
NEEDLE FILTER BLUNT 18X1 1/2 (NEEDLE) ×1 IMPLANT
NS IRRIG 1000ML POUR BTL (IV SOLUTION) ×3 IMPLANT
PACK BASIN MAJOR ARMC (MISCELLANEOUS) ×3 IMPLANT
PAD GROUND ADULT SPLIT (MISCELLANEOUS) ×3 IMPLANT
PATCH CAROTID ECM VASC 1X10 (Prosthesis & Implant Heart) ×6 IMPLANT
PENCIL ELECTRO HAND CTR (MISCELLANEOUS) IMPLANT
SHUNT CAROTID PRUITT F3 T3103A (SHUNT) ×3 IMPLANT
SUT MNCRL 4-0 (SUTURE) ×2
SUT MNCRL 4-0 27XMFL (SUTURE) ×1
SUT PROLENE 6 0 BV (SUTURE) ×15 IMPLANT
SUT PROLENE BV 1 BLUE 7-0 30IN (SUTURE) ×6 IMPLANT
SUT SILK 2 0 (SUTURE) ×2
SUT SILK 2-0 18XBRD TIE 12 (SUTURE) ×1 IMPLANT
SUT SILK 3 0 (SUTURE) ×2
SUT SILK 3-0 18XBRD TIE 12 (SUTURE) ×1 IMPLANT
SUT SILK 4 0 (SUTURE) ×2
SUT SILK 4-0 18XBRD TIE 12 (SUTURE) ×1 IMPLANT
SUT VIC AB 3-0 SH 27 (SUTURE) ×4
SUT VIC AB 3-0 SH 27X BRD (SUTURE) ×2 IMPLANT
SUTURE MNCRL 4-0 27XMF (SUTURE) ×1 IMPLANT
SYR 20CC LL (SYRINGE) ×3 IMPLANT
SYR 3ML LL SCALE MARK (SYRINGE) ×3 IMPLANT
SYRINGE 10CC LL (SYRINGE) ×3 IMPLANT
TOWEL OR 17X26 4PK STRL BLUE (TOWEL DISPOSABLE) IMPLANT
TUBING CONNECTING 10 (TUBING) IMPLANT
TUBING CONNECTING 10' (TUBING)

## 2015-01-29 NOTE — OR Nursing (Signed)
Strong equal hand and foot strength bilaterally in preop.

## 2015-01-29 NOTE — Addendum Note (Signed)
Addendum  created 01/29/15 1604 by Mathews ArgyleBenjamin Yanelli Zapanta, CRNA   Modules edited: Charges VN

## 2015-01-29 NOTE — Transfer of Care (Signed)
Immediate Anesthesia Transfer of Care Note  Patient: [name]  Procedure(s) Performed: Procedure(s): ENDARTERECTOMY CAROTID (Left)  Patient Location: PACU  Anesthesia Type:General  Level of Consciousness: awake, patient cooperative and responds to stimulation  Airway & Oxygen Therapy: Patient Spontanous Breathing and Patient connected to face mask oxygen  Post-op Assessment: Report given to RN, Post -op Vital signs reviewed and stable, Patient moving all extremities X 4 and Patient able to stick tongue midline  Post vital signs: Reviewed and stable  Last Vitals:  Filed Vitals:   01/29/15 1502  BP: 114/90  Pulse: 83  Temp: 37.6 C  Resp: 17    Complications: No apparent anesthesia complications

## 2015-01-29 NOTE — Op Note (Signed)
VEIN AND VASCULAR SURGERY   OPERATIVE NOTE  PROCEDURE:   1.  left carotid endarterectomy with CorMatrix arterial patch reconstruction  PRE-OPERATIVE DIAGNOSIS: 1.  Symptomatic left carotid stenosis 2.diabetes 3. HTN 4. obesity  POST-OPERATIVE DIAGNOSIS: same as above   SURGEON: Festus BarrenJason Lark Langenfeld, MD  ASSISTANT(S): Raul DelKim Stegmayer, PA-C  ANESTHESIA: general  ESTIMATED BLOOD LOSS: 50 cc  FINDING(S): 1.  left carotid plaque.  SPECIMEN(S):  Carotid plaque (sent to Pathology)  INDICATIONS:   [name] is a 54 y.o. female who presents with a left carotid stenosis of 80%. She had right arm symptoms worrisome for TIA symptoms. I discussed with the patient the risks, benefits, and alternatives to carotid endarterectomy.  I discussed the differences between carotid stenting and carotid endarterectomy. I discussed the procedural details of carotid endarterectomy with the patient.  The patient is aware that the risks of carotid endarterectomy include but are not limited to: bleeding, infection, stroke, myocardial infarction, death, cranial nerve injuries both temporary and permanent, neck hematoma, possible airway compromise, labile blood pressure post-operatively, cerebral hyperperfusion syndrome, and possible need for additional interventions in the future. The patient is aware of the risks and agrees to proceed forward with the procedure.  DESCRIPTION: After full informed written consent was obtained from the patient, the patient was brought back to the operating room and placed supine upon the operating table.  Prior to induction, the patient received IV antibiotics.  After obtaining adequate anesthesia, the patient was placed into a modified beach chair position with a shoulder roll in place and the patient's neck slightly hyperextended and rotated away from the surgical site.  The patient was prepped in the standard fashion for a carotid endarterectomy.  I made an incision anterior to  the sternocleidomastoid muscle and dissected down through the subcutaneous tissue.  The platysmas was opened with electrocautery.  Then I dissected down to the internal jugular vein and facial vein. Given her very large size and deep neck, the dissection was tedious. The facial vein is ligated and divided between 2-0 silk ties.  This was dissected posteriorly until I obtained visualization of the common carotid artery.  This was dissected out and then a vessel loop was placed around the common carotid artery.  I then dissected in a periadventitial fashion along the common carotid artery up to the bifurcation.  I then identified the external carotid artery and the superior thyroid artery.  I placed a vessel loop around the superior thyroid artery, and I also dissected out the external carotid artery and placed a vessel loop around it. In the process of this dissection, the hypoglossal nerve was identified and protected from harm.  I then dissected out the internal carotid artery until I identified an area in the internal carotid artery clearly above the stenosis.  I dissected slightly distal to this area, and placed a vessel loop around the artery.  At this point, we gave the patient 6000 units of intravenous heparin.  After this was allowed to circulate for several minutes, I pulled up control on the vessel loops to clamp the internal carotid artery, external carotid artery, superior thyroid artery, and then the common carotid artery.  I then made an arteriotomy in the common carotid artery with a 11 blade, and extended the arteriotomy with a Potts scissor down into the common carotid artery, then I carried the arteriotomy through the bifurcation into the internal carotid artery until I reached an area that was not diseased.  At this point,  I took the Pru[place] shunt that previously been prepared and I inserted it into the internal carotid artery first, and then into the common carotid artery taking care to  flush and de-air prior to release of control. At this point, I started the endarterectomy in the common carotid artery with a Penfield elevator and carried this dissection down into the common carotid artery circumferentially.  Then I transected the plaque at a segment where it was adherent and transected the plaque with Potts scissors.  I then carried this dissection up into the external carotid artery.  The plaque was extracted by unclamping the external carotid artery and performing an eversion endarterectomy.  The dissection was then carried into the internal carotid artery where a nice feathered end point was created with gentle traction.  I passed the plaque off the field as a specimen. At this point I removed all loose flecks and remaining disease possible.  At this point, I was satisfied that the minimal remaining disease was densely adherent to the wall and wall integrity was intact. The distal endpoint was tacked down with three 7-0 Prolene sutures.  I then fashioned a CorMatrix arterial patch for the artery and sewed it in place with two running stitch of 6-0 Prolene.  I started at the distal endpoint and ran one half the length of the arteriotomy.  I then cut and beveled the patch to an appropriate length to match the arteriotomy.  I started the second 6-0 Prolene at the proximal end point.  The medial suture line was completed and the lateral suture line was run approximately one quarter the length of the arteriotomy.  Prior to completing this patch angioplasty, I removed the shunt first from the internal carotid artery, from which there was excellent backbleeding, and clamped it.  Then I removed the shunt from the common carotid artery, from which there was excellent antegrade bleeding, and then clamped it.  At this point, I allowed the external carotid artery to backbleed, which was excellent.  Then I instilled heparinized saline in this patched artery and then completed the patch angioplasty in the  usual fashion.  First, I released the clamp on the external carotid artery, then I released it on the common carotid artery.  After waiting a few seconds, I then released it on the internal carotid artery. Several minutes of pressure were held and 6-0 Prolene patch sutures were used as need for hemostasis.  At this point, I placed Surgicel and Evicel topical hemostatic agents.  There was no more active bleeding in the surgical site.  The sternocleidomastoid space was closed with three interrupted 3-0 Vicryl sutures. I then reapproximated the platysma muscle with a running stitch of 3-0 Vicryl.  The skin was then closed with a running subcuticular 4-0 Monocryl.  The skin was then cleaned, dried and Dermabond was used to reinforce the skin closure.  The patient awakened and was taken to the recovery room in stable condition, following commands and moving all four extremities without any apparent deficits.    COMPLICATIONS: none  CONDITION: stable  [name]  01/29/2015, 2:27 PM

## 2015-01-29 NOTE — Anesthesia Procedure Notes (Signed)
Procedure Name: Intubation Date/Time: 01/29/2015 12:31 PM Performed by: Mathews ArgyleLOGAN, [name] Pre-anesthesia Checklist: Patient identified, Emergency Drugs available, Suction available, Patient being monitored and Timeout performed Patient Re-evaluated:Patient Re-evaluated prior to inductionOxygen Delivery Method: Ambu bag and Circle system utilized Preoxygenation: Pre-oxygenation with 100% oxygen Intubation Type: IV induction Ventilation: Mask ventilation without difficulty Laryngoscope Size: Miller and 2 Grade View: Grade I Tube type: Oral Tube size: 7.0 mm Number of attempts: 1 Airway Equipment and Method: Patient positioned with wedge pillow Placement Confirmation: ETT inserted through vocal cords under direct vision,  positive ETCO2 and breath sounds checked- equal and bilateral Secured at: 20 cm Tube secured with: Tape Dental Injury: Teeth and Oropharynx as per pre-operative assessment

## 2015-01-29 NOTE — Progress Notes (Signed)
Clarified with Dr. Wyn Quakerew about patient diet. Patient able to start clear liquids now.

## 2015-01-29 NOTE — H&P (Signed)
[facility] History & Physical Update  The patient was interviewed and re-examined.  The patient's previous History and Physical has been reviewed and is unchanged.  There is no change in the plan of care.  DEW,JASON, MD  01/29/2015, 11:14 AM

## 2015-01-29 NOTE — Anesthesia Preprocedure Evaluation (Addendum)
Anesthesia Evaluation  Patient identified by MRN, date of birth, ID band Patient awake    History of Anesthesia Complications Negative for: history of anesthetic complications  Airway Mallampati: III       Dental  (+) Edentulous Upper, Edentulous Lower   Pulmonary sleep apnea , COPDCurrent Smoker,  + rhonchi   + decreased breath sounds      Cardiovascular hypertension, + CAD and + Peripheral Vascular Disease Normal cardiovascular examII+ Carotid Bruit    Neuro/Psych Anxiety TIA   GI/Hepatic Neg liver ROS, GERD-  ,  Endo/Other  diabetes, Poorly Controlled, Type obesity  Renal/GU negative Renal ROS  negative genitourinary   Musculoskeletal  (+) Arthritis -, Osteoarthritis,    Abdominal (+) + obese,   Peds negative pediatric ROS (+)  Hematology  (+) anemia ,   Anesthesia Other Findings   Reproductive/Obstetrics negative OB ROS                            Anesthesia Physical Anesthesia Plan  ASA: IV  Anesthesia Plan: General   Post-op Pain Management:    Induction: Intravenous  Airway Management Planned: Oral ETT  Additional Equipment: Arterial line  Intra-op Plan:   Post-operative Plan: Extubation in OR  Informed Consent: I have reviewed the patients History and Physical, chart, labs and discussed the procedure including the risks, benefits and alternatives for the proposed anesthesia with the patient or authorized representative who has indicated his/her understanding and acceptance.     Plan Discussed with: CRNA and Surgeon  Anesthesia Plan Comments:         Anesthesia Quick Evaluation

## 2015-01-29 NOTE — OR Nursing (Signed)
Clamp times: Clamped @ 1332 Opened @ 1334  Shunt in @ 1334 Shunt out @ 1405  Clamp times: Clamped @ 1405 Opened @ 1407

## 2015-01-29 NOTE — Anesthesia Postprocedure Evaluation (Signed)
  Anesthesia Post-op Note  Patient: [name]  Procedure(s) Performed: Procedure(s): ENDARTERECTOMY CAROTID (Left)  Anesthesia type:General  Patient location: PACU  Post pain: Pain level controlled  Post assessment: Post-op Vital signs reviewed, Patient's Cardiovascular Status Stable, Respiratory Function Stable, Patent Airway and No signs of Nausea or vomiting  Post vital signs: Reviewed and stable  Last Vitals:  Filed Vitals:   01/29/15 1507  BP:   Pulse: 90  Temp:   Resp: 15    Level of consciousness: awake, alert  and patient cooperative  Complications: No apparent anesthesia complications

## 2015-01-30 LAB — BASIC METABOLIC PANEL
Anion gap: 7 (ref 5–15)
BUN: 11 mg/dL (ref 6–20)
CO2: 27 mmol/L (ref 22–32)
Calcium: 8.5 mg/dL — ABNORMAL LOW (ref 8.9–10.3)
Chloride: 104 mmol/L (ref 101–111)
Creatinine, Ser: 0.67 mg/dL (ref 0.44–1.00)
GLUCOSE: 181 mg/dL — AB (ref 65–99)
POTASSIUM: 4.1 mmol/L (ref 3.5–5.1)
SODIUM: 138 mmol/L (ref 135–145)

## 2015-01-30 LAB — CBC
HEMATOCRIT: 28.3 % — AB (ref 35.0–47.0)
HEMOGLOBIN: 9 g/dL — AB (ref 12.0–16.0)
MCH: 24.2 pg — ABNORMAL LOW (ref 26.0–34.0)
MCHC: 31.7 g/dL — ABNORMAL LOW (ref 32.0–36.0)
MCV: 76.3 fL — AB (ref 80.0–100.0)
Platelets: 176 10*3/uL (ref 150–440)
RBC: 3.71 MIL/uL — ABNORMAL LOW (ref 3.80–5.20)
RDW: 15.6 % — ABNORMAL HIGH (ref 11.5–14.5)
WBC: 11.6 10*3/uL — AB (ref 3.6–11.0)

## 2015-01-30 LAB — GLUCOSE, CAPILLARY: Glucose-Capillary: 170 mg/dL — ABNORMAL HIGH (ref 70–99)

## 2015-01-30 NOTE — Progress Notes (Signed)
Patient doing well No events overnight Neuro status normal AF/VSS Minimal neck swelling Labs ok D/C home today

## 2015-01-30 NOTE — Progress Notes (Signed)
Discharged to home per wheelchair. Daughter and mother attending.

## 2015-01-30 NOTE — Progress Notes (Signed)
Pt verbalizes understanding of home meds, incision care, activity limits, return appt and symptoms to report.  Up in room without problems.  Appetite good.  Voids without problems

## 2015-02-02 LAB — SURGICAL PATHOLOGY

## 2015-02-03 ENCOUNTER — Encounter: Payer: Self-pay | Admitting: Vascular Surgery

## 2015-02-04 ENCOUNTER — Encounter: Payer: Self-pay | Admitting: Vascular Surgery

## 2015-02-04 NOTE — Op Note (Signed)
NAMMacie Burows:  Minetti, Gilberto                 ACCOUNT NO.:  [id number]  MEDICAL RECORD NO.:  [id number]  LOCATION:  IC07A                        FACILITY:  [facility]  PHYSICIAN:  Annice NeedyJason S Brinton Brandel, MD        DATE OF BIRTH:  1961/08/10  DATE OF PROCEDURE:  01/29/2015 DATE OF DISCHARGE:  01/30/2015                              OPERATIVE REPORT   PREOPERATIVE DIAGNOSIS: 1. High-grade left carotid artery stenosis. 2. Diabetes. 3. Hypertension. 4. Morbid obesity. 5. Need for invasive arterial monitoring during procedure.  POSTOPERATIVE DIAGNOSIS: 1. High-grade left carotid artery stenosis. 2. Diabetes. 3. Hypertension. 4. Morbid obesity. 5. Need for invasive arterial monitoring during procedure.  PROCEDURE PERFORMED: 1. Ultrasound guidance for vascular access to right radial artery. 2. Placement of right radial arterial line.  SURGEON:  Annice NeedyJason S Kaylyne Axton, MD  ANESTHESIA:  None.  BLOOD LOSS:  Minimal.  INDICATION FOR PROCEDURE:  This is a morbidly-obese female who we are getting ready to perform a carotid endarterectomy on.  Anesthesia has tried to place an arterial line without success and I am asked to place this.  She needs an arterial line for invasive hemodynamic monitoring during the surgery.  DESCRIPTION OF PROCEDURE:  The right wrist was sterilely prepped and draped.  The right radial artery was found to be patent.  It was visualized under ultrasound guidance and accessed with only a mild amount of difficulty with a micropuncture needle.  A micropuncture wire and sheath were then placed, and a micropuncture sheath was hooked up to monitoring for an arterial line.  It was flushed with heparinized saline and a sterile dressing was placed.          ______________________________ Annice NeedyJason S Samyria Rudie, MD     JSD/MEDQ  D:  02/04/2015  T:  02/04/2015  Job:  [id number]

## 2015-02-06 ENCOUNTER — Encounter: Payer: Self-pay | Admitting: Vascular Surgery

## 2015-05-26 ENCOUNTER — Other Ambulatory Visit: Payer: Self-pay | Admitting: Vascular Surgery

## 2015-05-27 ENCOUNTER — Ambulatory Visit
Admission: RE | Admit: 2015-05-27 | Discharge: 2015-05-27 | Disposition: A | Discharge status: Home / Self Care | Payer: Medicaid Other | Source: Ambulatory Visit | Attending: Vascular Surgery | Admitting: Vascular Surgery

## 2015-05-27 ENCOUNTER — Encounter: Payer: Self-pay | Admitting: *Deleted

## 2015-05-27 ENCOUNTER — Encounter
Admission: RE | Disposition: A | Discharge status: Home / Self Care | Payer: Self-pay | Source: Ambulatory Visit | Attending: Vascular Surgery

## 2015-05-27 DIAGNOSIS — J449 Chronic obstructive pulmonary disease, unspecified: Secondary | ICD-10-CM | POA: Insufficient documentation

## 2015-05-27 DIAGNOSIS — J439 Emphysema, unspecified: Secondary | ICD-10-CM | POA: Diagnosis not present

## 2015-05-27 DIAGNOSIS — I70213 Atherosclerosis of native arteries of extremities with intermittent claudication, bilateral legs: Secondary | ICD-10-CM | POA: Diagnosis present

## 2015-05-27 DIAGNOSIS — Z7982 Long term (current) use of aspirin: Secondary | ICD-10-CM | POA: Insufficient documentation

## 2015-05-27 DIAGNOSIS — I6529 Occlusion and stenosis of unspecified carotid artery: Secondary | ICD-10-CM | POA: Insufficient documentation

## 2015-05-27 DIAGNOSIS — I509 Heart failure, unspecified: Secondary | ICD-10-CM | POA: Insufficient documentation

## 2015-05-27 DIAGNOSIS — E119 Type 2 diabetes mellitus without complications: Secondary | ICD-10-CM | POA: Insufficient documentation

## 2015-05-27 DIAGNOSIS — I771 Stricture of artery: Secondary | ICD-10-CM | POA: Diagnosis not present

## 2015-05-27 DIAGNOSIS — G458 Other transient cerebral ischemic attacks and related syndromes: Secondary | ICD-10-CM | POA: Insufficient documentation

## 2015-05-27 DIAGNOSIS — I10 Essential (primary) hypertension: Secondary | ICD-10-CM | POA: Insufficient documentation

## 2015-05-27 DIAGNOSIS — E785 Hyperlipidemia, unspecified: Secondary | ICD-10-CM | POA: Diagnosis not present

## 2015-05-27 DIAGNOSIS — Z79899 Other long term (current) drug therapy: Secondary | ICD-10-CM | POA: Diagnosis not present

## 2015-05-27 HISTORY — PX: PERIPHERAL VASCULAR CATHETERIZATION: SHX172C

## 2015-05-27 LAB — GLUCOSE, CAPILLARY: Glucose-Capillary: 192 mg/dL — ABNORMAL HIGH (ref 65–99)

## 2015-05-27 LAB — CREATININE, SERUM
CREATININE: 0.74 mg/dL (ref 0.44–1.00)
GFR calc Af Amer: >60 mL/min (ref >60)

## 2015-05-27 LAB — BUN: BUN: 13 mg/dL (ref 6–20)

## 2015-05-27 SURGERY — LOWER EXTREMITY ANGIOGRAPHY
Anesthesia: Moderate Sedation | Laterality: Right

## 2015-05-27 MED ORDER — DIPHENHYDRAMINE HCL 50 MG/ML IJ SOLN
INTRAMUSCULAR | Status: AC
  Filled 2015-05-27: qty 1

## 2015-05-27 MED ORDER — ACETAMINOPHEN 325 MG RE SUPP: 325.0000 mg | RECTAL | Status: DC | PRN

## 2015-05-27 MED ORDER — FENTANYL CITRATE (PF) 100 MCG/2ML IJ SOLN
INTRAMUSCULAR | Status: AC
  Filled 2015-05-27: qty 2

## 2015-05-27 MED ORDER — DEXTROSE 5 % IV SOLN
1.5000 g | INTRAVENOUS | Status: DC
  Administered 2015-05-27: 1.5 g via INTRAVENOUS
  Filled 2015-05-27: qty 1.5

## 2015-05-27 MED ORDER — LIDOCAINE-EPINEPHRINE (PF) 1 %-1:200000 IJ SOLN
INTRAMUSCULAR | Status: DC | PRN
  Administered 2015-05-27: 10 mL via INTRADERMAL

## 2015-05-27 MED ORDER — HYDROMORPHONE HCL 1 MG/ML IJ SOLN: 0.5000 mg | INTRAMUSCULAR | Status: DC | PRN

## 2015-05-27 MED ORDER — OXYCODONE-ACETAMINOPHEN 5-325 MG PO TABS: 1.0000 | ORAL_TABLET | ORAL | Status: DC | PRN

## 2015-05-27 MED ORDER — FENTANYL CITRATE (PF) 100 MCG/2ML IJ SOLN
INTRAMUSCULAR | Status: DC | PRN
  Administered 2015-05-27: 100 ug via INTRAVENOUS
  Administered 2015-05-27: 50 ug via INTRAVENOUS

## 2015-05-27 MED ORDER — HEPARIN SODIUM (PORCINE) 1000 UNIT/ML IJ SOLN
INTRAMUSCULAR | Status: DC | PRN
  Administered 2015-05-27: 5000 [IU] via INTRAVENOUS

## 2015-05-27 MED ORDER — GUAIFENESIN-DM 100-10 MG/5ML PO SYRP
15.0000 mL | ORAL_SOLUTION | ORAL | Status: DC | PRN
Start: 2015-05-27 — End: 2015-05-27

## 2015-05-27 MED ORDER — HYDRALAZINE HCL 20 MG/ML IJ SOLN: 5.0000 mg | INTRAMUSCULAR | Status: DC | PRN

## 2015-05-27 MED ORDER — MIDAZOLAM HCL 5 MG/5ML IJ SOLN
INTRAMUSCULAR | Status: AC
  Filled 2015-05-27: qty 5

## 2015-05-27 MED ORDER — MIDAZOLAM HCL 2 MG/2ML IJ SOLN
INTRAMUSCULAR | Status: DC | PRN
Start: 2015-05-27 — End: 2015-05-27
  Administered 2015-05-27: 3 mg via INTRAVENOUS

## 2015-05-27 MED ORDER — ONDANSETRON HCL 4 MG/2ML IJ SOLN
4.0000 mg | Freq: Four times a day (QID) | INTRAMUSCULAR | Status: DC | PRN
Start: 2015-05-27 — End: 2015-05-27

## 2015-05-27 MED ORDER — INSULIN ASPART 100 UNIT/ML ~~LOC~~ SOLN: 0.0000 [IU] | SUBCUTANEOUS | Status: DC

## 2015-05-27 MED ORDER — CHLORHEXIDINE GLUCONATE CLOTH 2 % EX PADS: 6.0000 | MEDICATED_PAD | Freq: Once | CUTANEOUS | Status: DC

## 2015-05-27 MED ORDER — HEPARIN SODIUM (PORCINE) 1000 UNIT/ML IJ SOLN
INTRAMUSCULAR | Status: AC
  Filled 2015-05-27: qty 1

## 2015-05-27 MED ORDER — METOPROLOL TARTRATE 1 MG/ML IV SOLN: 2.0000 mg | INTRAVENOUS | Status: DC | PRN

## 2015-05-27 MED ORDER — CEFAZOLIN SODIUM 1-5 GM-% IV SOLN
1.0000 g | Freq: Once | INTRAVENOUS | Status: AC
Start: 2015-05-27 — End: 2015-05-27
  Administered 2015-05-27: 1 g via INTRAVENOUS

## 2015-05-27 MED ORDER — HEPARIN (PORCINE) IN NACL 2-0.9 UNIT/ML-% IJ SOLN
INTRAMUSCULAR | Status: AC
  Filled 2015-05-27: qty 1000

## 2015-05-27 MED ORDER — LABETALOL HCL 5 MG/ML IV SOLN: 10.0000 mg | INTRAVENOUS | Status: DC | PRN

## 2015-05-27 MED ORDER — ACETAMINOPHEN 325 MG PO TABS: 325.0000 mg | ORAL_TABLET | ORAL | Status: DC | PRN

## 2015-05-27 MED ORDER — CEFAZOLIN SODIUM 1-5 GM-% IV SOLN
INTRAVENOUS | Status: AC
  Filled 2015-05-27: qty 50

## 2015-05-27 MED ORDER — LIDOCAINE-EPINEPHRINE (PF) 1 %-1:200000 IJ SOLN
INTRAMUSCULAR | Status: AC
  Filled 2015-05-27: qty 30

## 2015-05-27 MED ORDER — SODIUM CHLORIDE 0.9 % IV SOLN
INTRAVENOUS | Status: DC
  Administered 2015-05-27 (×2): via INTRAVENOUS

## 2015-05-27 MED ORDER — IOHEXOL 300 MG/ML  SOLN
INTRAMUSCULAR | Status: DC | PRN
Start: 2015-05-27 — End: 2015-05-27
  Administered 2015-05-27: 55 mL via INTRA_ARTERIAL

## 2015-05-27 MED ORDER — DIPHENHYDRAMINE HCL 50 MG/ML IJ SOLN
INTRAMUSCULAR | Status: DC | PRN
  Administered 2015-05-27: 50 mg via INTRAVENOUS

## 2015-05-27 MED ORDER — PHENOL 1.4 % MT LIQD: 1.0000 | OROMUCOSAL | Status: DC | PRN

## 2015-05-27 SURGICAL SUPPLY — 14 items
BALLN LUTONIX 6X150X130 (BALLOONS) ×8
BALLOON LUTONIX 6X150X130 (BALLOONS) ×4 IMPLANT
CATH PIG 70CM (CATHETERS) ×4 IMPLANT
CATH VERT 100CM (CATHETERS) ×4 IMPLANT
DEVICE PRESTO INFLATION (MISCELLANEOUS) ×4 IMPLANT
DEVICE STARCLOSE SE CLOSURE (Vascular Products) ×4 IMPLANT
GLIDEWIRE ADV .035X260CM (WIRE) ×4 IMPLANT
LIFESTENT SOLO 6X200X135 (Permanent Stent) ×4 IMPLANT
PACK ANGIOGRAPHY (CUSTOM PROCEDURE TRAY) ×4 IMPLANT
SHEATH ANL2 6FRX45 HC (SHEATH) ×4 IMPLANT
SHEATH BRITE TIP 5FRX11 (SHEATH) ×4 IMPLANT
SYR MEDRAD MARK V 150ML (SYRINGE) ×4 IMPLANT
TUBING CONTRAST HIGH PRESS 72 (TUBING) ×4 IMPLANT
WIRE J 3MM .035X145CM (WIRE) ×4 IMPLANT

## 2015-05-27 NOTE — H&P (Signed)
  [facility] History & Physical Update  The patient was interviewed and re-examined.  The patient's previous History and Physical has been reviewed and is unchanged.  There is no change in the plan of care. We plan to proceed with the scheduled procedure.  DEW,JASON, MD  05/27/2015, 9:37 AM

## 2015-05-27 NOTE — Op Note (Signed)
[facility] Percutaneous Study/Intervention Procedural Note   Date of Surgery: 05/27/2015  Surgeon(s):Christino Mcglinchey   Assistants:none  Pre-operative Diagnosis: PAD with claudication bilateral lower extremities right greater than left  Post-operative diagnosis: Same  Procedure(s) Performed: 1. Ultrasound guidance for vascular access left femoral artery 2. Catheter placement into right popliteal artery from left femoral approach 3. Aortogram and selective right lower extremity angiogram 4. Percutaneous transluminal angioplasty of proximal and mid SFA with 6 mm diameter by 15 cm length Lutonix drug-coated angioplasty balloon 5. Percutaneous transluminal angioplasty of distal SFA and above-knee popliteal artery with 6 mm diameter by 15 cm length Lutonix drug-coated angioplasty balloon  6.  Self-expanding stent placement to the mid to distal SFA and proximal popliteal artery with 6 mm diameter by 20 cm length life stent for greater than 50% residual stenosis and dissection after angioplasty 7. StarClose closure device left femoral artery  EBL: Minimal  Indications: Patient is a 54 year old white female with claudication symptoms in the lower extremities, worse on the right. The patient has noninvasive study showing severely reduced right ABI of 0.59 and mild reduction in the left ABI. The patient is brought in for angiography for further evaluation and potential treatment. Risks and benefits are discussed and informed consent is obtained  Procedure: The patient was identified and appropriate procedural time out was performed. The patient was then placed supine on the table and prepped and draped in the usual sterile fashion. Ultrasound was used to evaluate the left common femoral artery. It was patent . A digital ultrasound image was acquired. A Seldinger needle was used to  access the left common femoral artery under direct ultrasound guidance and a permanent image was performed. A 0.035 J wire was advanced without resistance and a 5Fr sheath was placed. Pigtail catheter was placed into the aorta and an AP aortogram was performed. This demonstrated normal renal arteries although the right renal artery was much higher than the left and normal aorta and iliac segments without significant stenosis. I then crossed the aortic bifurcation and advanced to the right femoral head and into the proximal superficial femoral artery. Selective right lower extremity angiogram was then performed. This demonstrated a diffusely diseased right SFA with a 70% stenosis proximally, multiple areas of >60% stenosis in the proximal and mid SFA.  There was a near occlusive stenosis in the distal SFA.  The AK popliteal artery was then moderately diseased in the 60-70% range.  She then had a normal below knee popliteal artery and two vessel runoff distally. The patient was systemically heparinized and a 6 [place] Ansell sheath was then placed over the [employer] wire. I then used a Kumpe catheter and the advantage wire to navigate through the SFA and popliteal lesions and confirm intraluminal flow in the below-knee popliteal artery. At this point I elected to treat the lesions using two 6 mm diameter by 15 cm length Lutonix drug coated balloons. The first was started in the mid above-knee popliteal artery and taken up to the mid superficial femoral artery. The second was taken from the mid superficial femoral artery to the proximal superficial femoral artery. Each inflation was to about 6-8 atm and held for 1 minute. Completion angiogram showed significant residual stenosis of greater than 60% in the mid to distal SFA with a long segment dissection. I elected to cover this lesion with a self-expanding stent and selected a 6 mm diameter by 20 cm length self-expanding stent that was started in the mid SFA  and terminated in the above-knee popliteal artery treating the significant residual disease in the mid and distal SFA and the proximal portion of the popliteal artery. This was postdilated with a 6 mm balloon with an excellent angiographic completion result and less than 20% residual stenosis. At this point I felt we had improved circulation as much as possible. I elected to terminate the procedure. The sheath was removed and StarClose closure device was deployed in the left femoral artery with excellent hemostatic result. The patient was taken to the recovery room in stable condition having tolerated the procedure well.  Findings:  Aortogram: Normal aorta and iliac segments. Renal arteries patent bilaterally with right renal artery being much higher than the left. Right Lower Extremity: multiple areas of >60% stenosis throughout the SFA and nearly occlusive stenosis in the distal SFA with moderate disease in the AK popliteal artery.  Two vessel runoff distally   Disposition: Patient was taken to the recovery room in stable condition having tolerated the procedure well.  Complications: None  Eilyn Polack 05/27/2015 11:42 AM

## 2015-05-28 ENCOUNTER — Encounter: Payer: Self-pay | Admitting: Vascular Surgery

## 2015-12-14 IMAGING — US US BREAST*R* LIMITED INC AXILLA
1 series · 6 of 6 positions shown · non-contrast
Comparison: Previous mammograms.

CLINICAL DATA: Screening recall for a possible mass within the
lower slightly outer right breast.

EXAM:
DIGITAL DIAGNOSTIC RIGHT MAMMOGRAM WITH 3D TOMOSYNTHESIS WITH CAD
ULTRASOUND RIGHT BREAST

[Series 1: us breast*right* limited inc axilla · 0.08mm/px · 6 of 6 slices shown]
[im 1/6]
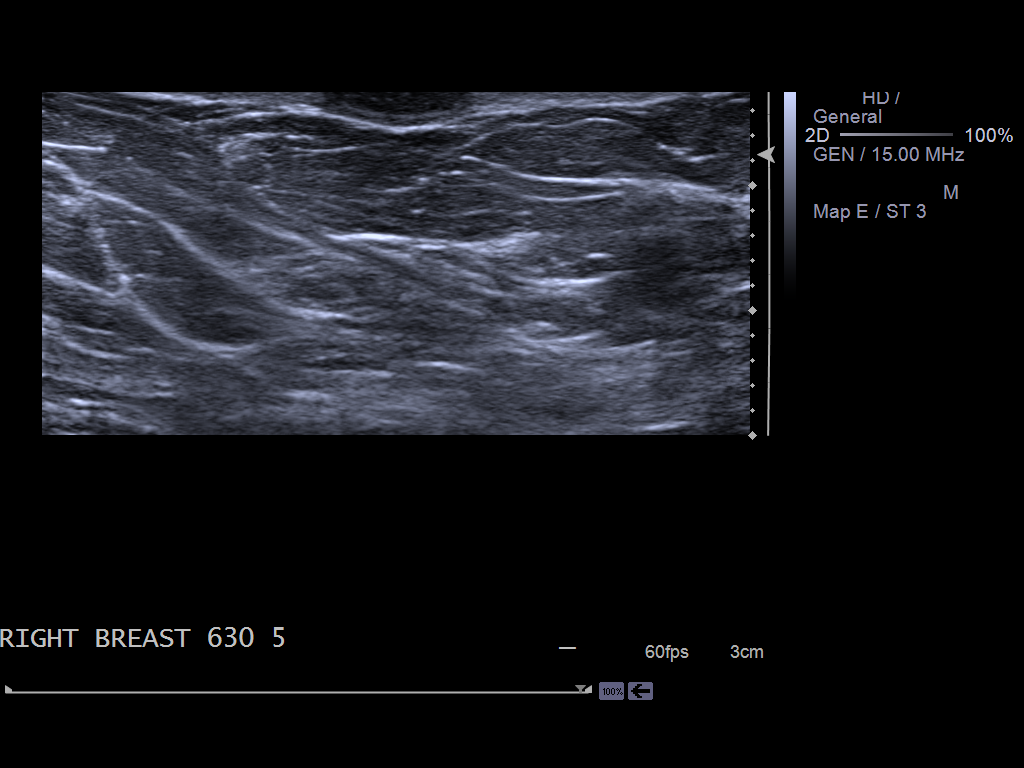
[im 2/6]
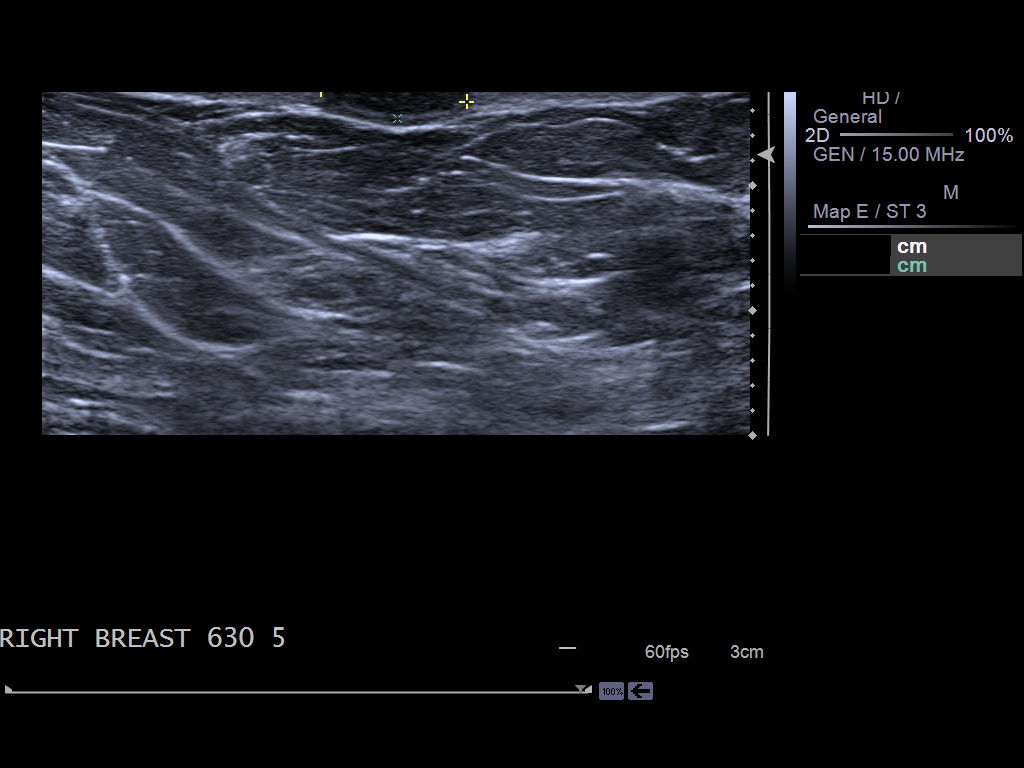
[im 3/6]
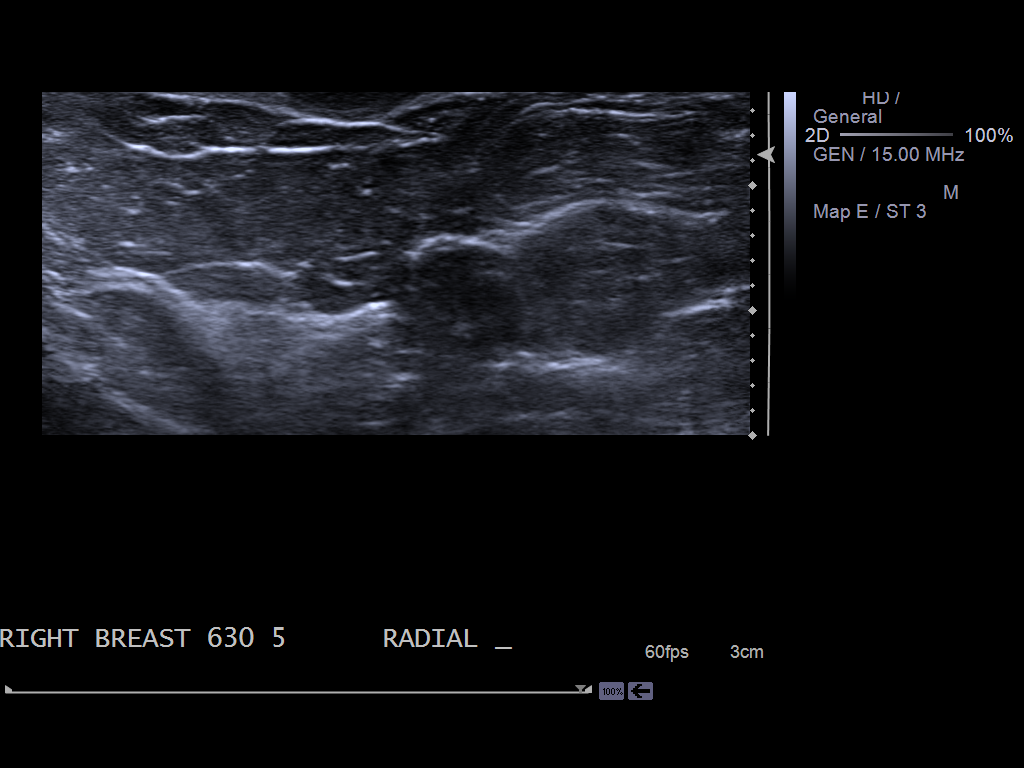
[im 4/6]
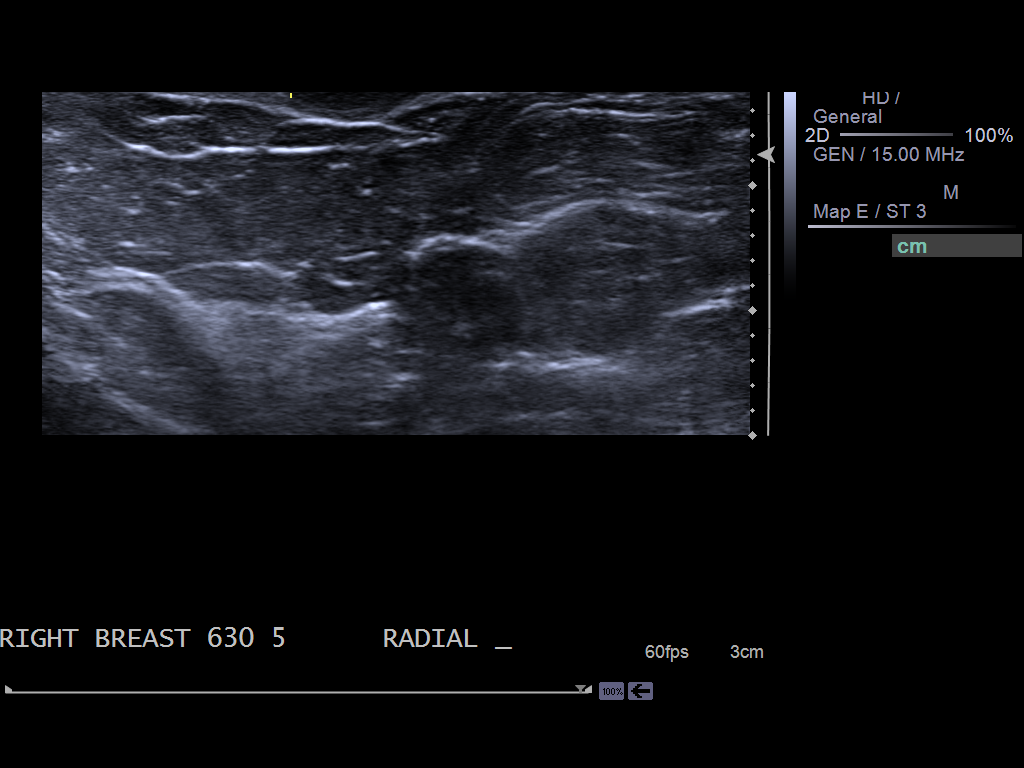
[im 5/6]
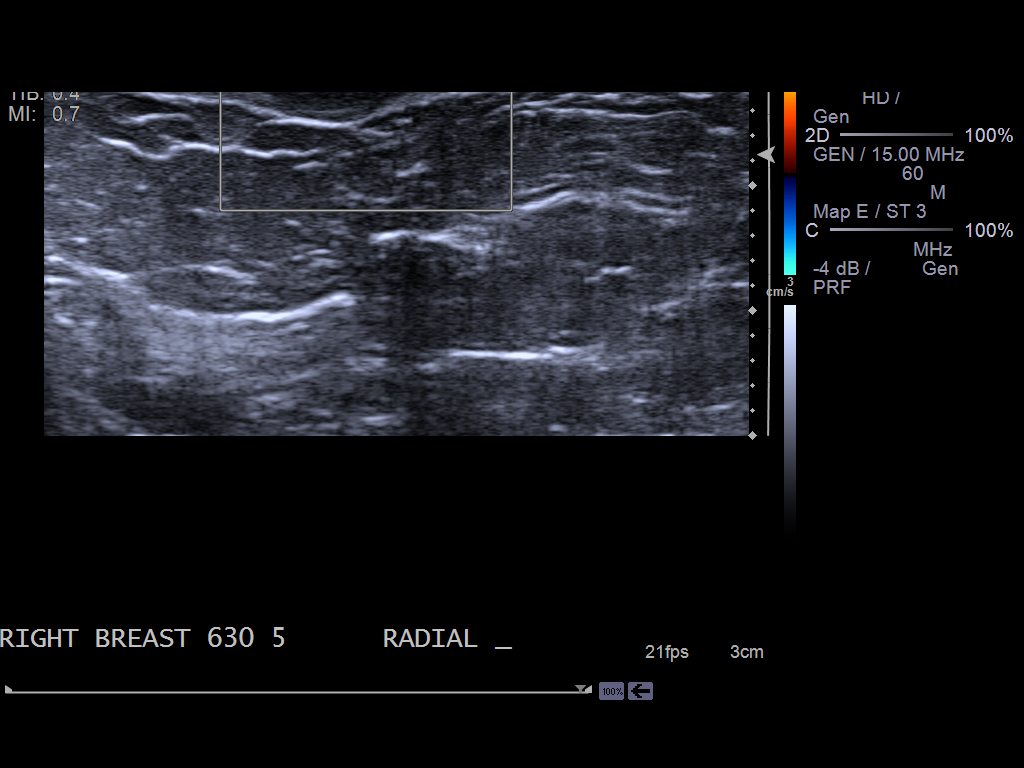
[im 6/6]
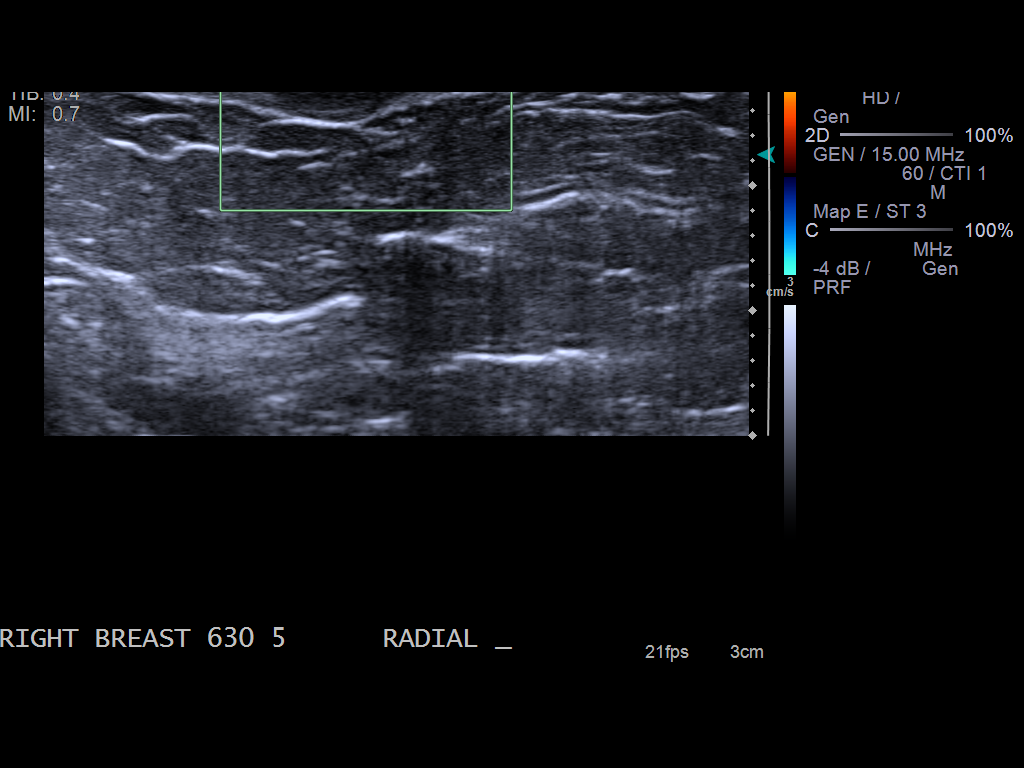

[6 of 6 positions shown; findings below may reference images not displayed]

ACR Breast Density Category b: There are scattered areas of
fibroglandular density.
FINDINGS: CC and MLO tomosynthesis views were performed of the right breast
with the initially questioned possible mass no longer visualized.
This is felt to represent a mass associated with the skin.

Mammographic images were processed with CAD.

Physical examination of the lower slightly outer right breast
reveals a firm nodule within the skin containing a tiny central pore
compatible with a sebaceous cyst.

Targeted ultrasound of the right breast was performed demonstrating
an oval well-circumscribed hypoechoic mass within the skin
compatible with a sebaceous cyst at 630 5 cm from the nipple
measuring 1.2 x 0.4 x 0.9 cm. This corresponds with mammography
findings.
IMPRESSION: Right breast sebaceous cyst. There is no mammographic evidence of
malignancy in the right breast.

RECOMMENDATION:
Screening mammogram in one year.(Code:68-V-X8W)

I have discussed the findings and recommendations with the patient.
Results were also provided in writing at the conclusion of the
visit. If applicable, a reminder letter will be sent to the patient
regarding the next appointment.

BI-RADS CATEGORY  2: Benign.

## 2016-02-26 ENCOUNTER — Encounter: Payer: Self-pay | Admitting: Vascular Surgery

## 2016-03-14 ENCOUNTER — Ambulatory Visit: Payer: Medicaid Other | Admitting: Dietician

## 2016-03-15 ENCOUNTER — Encounter: Payer: Self-pay | Admitting: Dietician

## 2016-03-15 ENCOUNTER — Encounter: Payer: Self-pay | Admitting: Primary Care

## 2016-07-15 ENCOUNTER — Other Ambulatory Visit: Payer: Self-pay | Admitting: Psychiatry

## 2016-08-02 ENCOUNTER — Emergency Department: Payer: Medicaid Other

## 2016-08-02 ENCOUNTER — Emergency Department
Admission: EM | Admit: 2016-08-02 | Discharge: 2016-08-02 | Disposition: A | Discharge status: Home / Self Care | Payer: Medicaid Other | Attending: Emergency Medicine | Admitting: Emergency Medicine

## 2016-08-02 DIAGNOSIS — Z794 Long term (current) use of insulin: Secondary | ICD-10-CM | POA: Insufficient documentation

## 2016-08-02 DIAGNOSIS — I509 Heart failure, unspecified: Secondary | ICD-10-CM | POA: Diagnosis not present

## 2016-08-02 DIAGNOSIS — I110 Hypertensive heart disease with heart failure: Secondary | ICD-10-CM | POA: Diagnosis not present

## 2016-08-02 DIAGNOSIS — Z7982 Long term (current) use of aspirin: Secondary | ICD-10-CM | POA: Insufficient documentation

## 2016-08-02 DIAGNOSIS — X58XXXD Exposure to other specified factors, subsequent encounter: Secondary | ICD-10-CM | POA: Diagnosis not present

## 2016-08-02 DIAGNOSIS — S81801D Unspecified open wound, right lower leg, subsequent encounter: Secondary | ICD-10-CM | POA: Insufficient documentation

## 2016-08-02 DIAGNOSIS — Z79899 Other long term (current) drug therapy: Secondary | ICD-10-CM | POA: Insufficient documentation

## 2016-08-02 DIAGNOSIS — M7989 Other specified soft tissue disorders: Secondary | ICD-10-CM | POA: Diagnosis present

## 2016-08-02 DIAGNOSIS — I2510 Atherosclerotic heart disease of native coronary artery without angina pectoris: Secondary | ICD-10-CM | POA: Diagnosis not present

## 2016-08-02 DIAGNOSIS — F17210 Nicotine dependence, cigarettes, uncomplicated: Secondary | ICD-10-CM | POA: Diagnosis not present

## 2016-08-02 DIAGNOSIS — E119 Type 2 diabetes mellitus without complications: Secondary | ICD-10-CM | POA: Diagnosis not present

## 2016-08-02 DIAGNOSIS — L089 Local infection of the skin and subcutaneous tissue, unspecified: Secondary | ICD-10-CM

## 2016-08-02 DIAGNOSIS — L0390 Cellulitis, unspecified: Secondary | ICD-10-CM

## 2016-08-02 DIAGNOSIS — J449 Chronic obstructive pulmonary disease, unspecified: Secondary | ICD-10-CM | POA: Insufficient documentation

## 2016-08-02 DIAGNOSIS — L03115 Cellulitis of right lower limb: Secondary | ICD-10-CM | POA: Diagnosis not present

## 2016-08-02 DIAGNOSIS — T148XXA Other injury of unspecified body region, initial encounter: Secondary | ICD-10-CM

## 2016-08-02 LAB — CBC WITH DIFFERENTIAL/PLATELET
Basophils Absolute: 0 10*3/uL (ref 0–0.1)
Basophils Relative: 0 %
Eosinophils Absolute: 0.3 10*3/uL (ref 0–0.7)
Eosinophils Relative: 3 %
HEMATOCRIT: 35.9 % (ref 35.0–47.0)
HEMOGLOBIN: 11.1 g/dL — AB (ref 12.0–16.0)
LYMPHS ABS: 1.1 10*3/uL (ref 1.0–3.6)
Lymphocytes Relative: 11 %
MCH: 23.8 pg — AB (ref 26.0–34.0)
MCHC: 31 g/dL — AB (ref 32.0–36.0)
MCV: 76.6 fL — AB (ref 80.0–100.0)
MONO ABS: 0.6 10*3/uL (ref 0.2–0.9)
MONOS PCT: 6 %
NEUTROS ABS: 7.8 10*3/uL — AB (ref 1.4–6.5)
NEUTROS PCT: 80 %
Platelets: 167 10*3/uL (ref 150–440)
RBC: 4.68 MIL/uL (ref 3.80–5.20)
RDW: 17.2 % — ABNORMAL HIGH (ref 11.5–14.5)
WBC: 9.8 10*3/uL (ref 3.6–11.0)

## 2016-08-02 LAB — COMPREHENSIVE METABOLIC PANEL
ALBUMIN: 3.7 g/dL (ref 3.5–5.0)
ALT: 25 U/L (ref 14–54)
ANION GAP: 9 (ref 5–15)
AST: 25 U/L (ref 15–41)
Alkaline Phosphatase: 93 U/L (ref 38–126)
BUN: 18 mg/dL (ref 6–20)
CHLORIDE: 102 mmol/L (ref 101–111)
CO2: 29 mmol/L (ref 22–32)
CREATININE: 1.01 mg/dL — AB (ref 0.44–1.00)
Calcium: 9.4 mg/dL (ref 8.9–10.3)
GFR calc non Af Amer: >60 mL/min (ref >60)
GLUCOSE: 219 mg/dL — AB (ref 65–99)
Potassium: 4 mmol/L (ref 3.5–5.1)
SODIUM: 140 mmol/L (ref 135–145)
Total Bilirubin: <0.1 mg/dL — ABNORMAL LOW (ref 0.3–1.2)
Total Protein: 7.5 g/dL (ref 6.5–8.1)

## 2016-08-02 MED ORDER — TRAMADOL HCL 50 MG PO TABS: 50.0000 mg | ORAL_TABLET | Freq: Four times a day (QID) | ORAL | 0 refills | Status: DC | PRN

## 2016-08-02 MED ORDER — CLINDAMYCIN PHOSPHATE 600 MG/50ML IV SOLN
600.0000 mg | Freq: Once | INTRAVENOUS | Status: AC
  Administered 2016-08-02: 600 mg via INTRAVENOUS
  Filled 2016-08-02: qty 50

## 2016-08-02 MED ORDER — CLINDAMYCIN HCL 300 MG PO CAPS: 300.0000 mg | ORAL_CAPSULE | Freq: Four times a day (QID) | ORAL | 0 refills | Status: DC

## 2016-08-02 MED ORDER — IOPAMIDOL (ISOVUE-300) INJECTION 61%
100.0000 mL | Freq: Once | INTRAVENOUS | Status: AC | PRN
  Administered 2016-08-02: 100 mL via INTRAVENOUS
  Filled 2016-08-02: qty 100

## 2016-08-02 MED ORDER — MORPHINE SULFATE (PF) 4 MG/ML IV SOLN
4.0000 mg | Freq: Once | INTRAVENOUS | Status: AC
  Administered 2016-08-02: 4 mg via INTRAVENOUS
  Filled 2016-08-02: qty 1

## 2016-08-02 MED ORDER — SODIUM CHLORIDE 0.9 % IV BOLUS (SEPSIS)
1000.0000 mL | Freq: Once | INTRAVENOUS | Status: AC
  Administered 2016-08-02: 1000 mL via INTRAVENOUS

## 2016-08-02 MED ORDER — ONDANSETRON HCL 4 MG/2ML IJ SOLN
4.0000 mg | Freq: Once | INTRAMUSCULAR | Status: AC
  Administered 2016-08-02: 4 mg via INTRAVENOUS
  Filled 2016-08-02: qty 2

## 2016-08-02 NOTE — ED Notes (Signed)
Pt with abscess posterior right calf. Area red, painful, with dime size open area. No drainage noted.

## 2016-08-02 NOTE — ED Provider Notes (Signed)
[facility] Emergency Department Provider Note  ____________________________________________  Time seen: Approximately 3:54 PM  I have reviewed the triage vital signs and the nursing notes.   HISTORY  Chief Complaint Abscess    HPI [name] is a 55 y.o. female who presents emergency department complaining of pain to the right posterior calf. Patient states that she has a chronic wound has been present 3 months. Patient states that it started as a "boil" her family tried to treat it at home but it worsened. She saw her primary care and has had an open wound to the posterior calf 3 months. Patient states that she has been on several rounds of antibiotics. She was supposed to follow up with primary care today but states that she was unable to make that appointment. Patient states that she has had increasing pain, redness, swelling to wound 3 days. Patient denies any fevers or chills, abdominal pain, nausea or vomiting. No recent antibiotic use. Patient denies any numbness or tingling distally. No chest pain or shortness of breath. Patient does have a significant history of CHF, COPD, CAD, diabetes, PVD.   Past Medical History:  Diagnosis Date  . Anemia   . Anxiety   . Arthritis   . CHF (congestive heart failure) (HCC)   . COPD (chronic obstructive pulmonary disease) (HCC)   . Coronary artery disease   . Diabetes mellitus without complication (HCC)   . Fibromyalgia   . GERD (gastroesophageal reflux disease)   . Hypertension   . Peripheral vascular disease (HCC)   . Sleep apnea   . Stroke [facility](HCC)     Patient Active Problem List   Diagnosis Date Noted  . Carotid artery obstruction 01/29/2015    Past Surgical History:  Procedure Laterality Date  . CESAREAN SECTION    . CORONARY ANGIOPLASTY WITH STENT PLACEMENT Left   . DILATION AND CURETTAGE OF UTERUS    . ENDARTERECTOMY Left 01/29/2015   Procedure: ENDARTERECTOMY CAROTID;  Surgeon: Annice NeedyJason S Dew, MD;   Location: [facility] ORS;  Service: Vascular;  Laterality: Left;  . PERIPHERAL VASCULAR CATHETERIZATION Right 05/27/2015   Procedure: Lower Extremity Angiography;  Surgeon: Annice NeedyJason S Dew, MD;  Location: [facility] INVASIVE CV LAB;  Service: Cardiovascular;  Laterality: Right;  . PERIPHERAL VASCULAR CATHETERIZATION  05/27/2015   Procedure: Lower Extremity Intervention;  Surgeon: Annice NeedyJason S Dew, MD;  Location: [facility] INVASIVE CV LAB;  Service: Cardiovascular;;  . TUBAL LIGATION      Prior to Admission medications   Medication Sig Start Date End Date Taking? Authorizing Provider  aspirin EC 81 MG tablet Take 81 mg by mouth daily.    Historical Provider, MD  carvedilol (COREG) 12.5 MG tablet Take 12.5 mg by mouth 2 (two) times daily with a meal.    Historical Provider, MD  cetirizine (ZYRTEC) 10 MG tablet Take 10 mg by mouth daily.    Historical Provider, MD  cholecalciferol (VITAMIN D) 1000 UNITS tablet Take 1,000 Units by mouth daily.    Historical Provider, MD  clindamycin (CLEOCIN) 300 MG capsule Take 1 capsule (300 mg total) by mouth 4 (four) times daily. 08/02/16   Delorise RoyalsJonathan D Cuthriell, PA-C  clopidogrel (PLAVIX) 75 MG tablet Take 75 mg by mouth daily.    Historical Provider, MD  cyclobenzaprine (FLEXERIL) 10 MG tablet Take 10 mg by mouth at bedtime.    Historical Provider, MD  docusate sodium (COLACE) 100 MG capsule Take 100 mg by mouth 2 (two) times daily.    Historical  Provider, MD  enalapril (VASOTEC) 20 MG tablet Take 20 mg by mouth daily.    Historical Provider, MD  furosemide (LASIX) 40 MG tablet Take 40 mg by mouth daily.    Historical Provider, MD  gabapentin (NEURONTIN) 300 MG capsule Take 300 mg by mouth daily as needed (for nerve pain).     Historical Provider, MD  glipiZIDE (GLUCOTROL) 10 MG tablet Take 10 mg by mouth 2 (two) times daily before a meal.    Historical Provider, MD  insulin glargine (LANTUS) 100 UNIT/ML injection Inject 50 Units into the skin daily.    Historical Provider, MD  metFORMIN  (GLUCOPHAGE) 1000 MG tablet Take 1,000 mg by mouth 2 (two) times daily with a meal.    Historical Provider, MD  pantoprazole (PROTONIX) 40 MG tablet Take 40 mg by mouth daily.    Historical Provider, MD  sertraline (ZOLOFT) 100 MG tablet Take 150 mg by mouth daily.     Historical Provider, MD  simvastatin (ZOCOR) 40 MG tablet Take 40 mg by mouth daily.     Historical Provider, MD  sitaGLIPtin (JANUVIA) 100 MG tablet Take 100 mg by mouth daily.    Historical Provider, MD  traMADol (ULTRAM) 50 MG tablet Take 1 tablet (50 mg total) by mouth every 6 (six) hours as needed. 08/02/16   Delorise Royals Cuthriell, PA-C  traZODone (DESYREL) 50 MG tablet Take 50 mg by mouth at bedtime.    Historical Provider, MD    Allergies Niacin and related  No family history on file.  Social History Social History  Substance Use Topics  . Smoking status: Current Some Day Smoker    Packs/day: 0.25    Years: 38.00    Types: Cigarettes  . Smokeless tobacco: Current User  . Alcohol use No     Review of Systems  Constitutional: No fever/chills Cardiovascular: no chest pain. Respiratory: no cough. No SOB. Gastrointestinal: No abdominal pain.  No nausea, no vomiting.  Musculoskeletal: Negative for musculoskeletal pain. Skin: Positive for increased pain, redness, swelling to chronic wound. Neurological: Negative for headaches, focal weakness or numbness. 10-point ROS otherwise negative.  ____________________________________________   PHYSICAL EXAM:  VITAL SIGNS: ED Triage Vitals  Enc Vitals Group     BP 08/02/16 1534 120/67     Pulse Rate 08/02/16 1532 95     Resp 08/02/16 1532 18     Temp 08/02/16 1532 98.4 F (36.9 C)     Temp Source 08/02/16 1532 Oral     SpO2 08/02/16 1532 97 %     Weight 08/02/16 1533 276 lb (125.2 kg)     Height 08/02/16 1533 5\' 5"  (1.651 m)     Head Circumference --      Peak Flow --      Pain Score 08/02/16 1533 10     Pain Loc --      Pain Edu? --      Excl. in GC? --       Constitutional: Alert and oriented. Well appearing and in no acute distress. Eyes: Conjunctivae are normal. PERRL. EOMI. Head: Atraumatic. Neck: No stridor.    Cardiovascular: Normal rate, regular rhythm. Normal S1 and S2.  Good peripheral circulation. Respiratory: Normal respiratory effort without tachypnea or retractions. Lungs CTAB. Good air entry to the bases with no decreased or absent breath sounds. Musculoskeletal: Full range of motion to all extremities. No gross deformities appreciated. Neurologic:  Normal speech and language. No gross focal neurologic deficits are appreciated.  Skin:  Skin is warm, dry and intact. No rash noted.Patient has open wound to posterior right calf. This is approximately 2.5 cm in diameter. Eschar-like tissue in the center of lesion. No drainage. Surrounding tissue is erythematous and edematous. This measures approximately 15 cm in diameter. Area is extremely tender to palpation. No induration or fluctuance is appreciated. Sensation and pulses intact distal to the wound. Psychiatric: Mood and affect are normal. Speech and behavior are normal. Patient exhibits appropriate insight and judgement.   ____________________________________________   LABS (all labs ordered are listed, but only abnormal results are displayed)  Labs Reviewed  COMPREHENSIVE METABOLIC PANEL - Abnormal; Notable for the following:       Result Value   Glucose, Bld 219 (*)    Creatinine, Ser 1.01 (*)    Total Bilirubin <0.1 (*)    All other components within normal limits  CBC WITH DIFFERENTIAL/PLATELET - Abnormal; Notable for the following:    Hemoglobin 11.1 (*)    MCV 76.6 (*)    MCH 23.8 (*)    MCHC 31.0 (*)    RDW 17.2 (*)    Neutro Abs 7.8 (*)    All other components within normal limits  CULTURE, BLOOD (ROUTINE X 2)  CULTURE, BLOOD (ROUTINE X 2)   ____________________________________________  EKG   ____________________________________________  RADIOLOGY Festus BarrenI,  Jonathan D Cuthriell, personally viewed and evaluated these images as part of my medical decision making, as well as reviewing the written report by the radiologist.  Ct Tibia Fibula Right W Contrast  Result Date: 08/02/2016 CLINICAL DATA:  Open wound for 3 months. Pain, swelling and redness. EXAM: CT OF THE RIGHT TIBIA AND FIBULA WITH CONTRAST TECHNIQUE: Standard CT imaging of the right lower extremity was performed after the infusion of contrast material. Coronal and sagittal reformatted images were performed. CONTRAST:  100mL ISOVUE-300 IOPAMIDOL (ISOVUE-300) INJECTION 61% COMPARISON:  None FINDINGS: Moderate degenerative changes are noted at the knee joint but no acute bony findings or joint effusion. A small Baker's cyst is noted. The ankle joint is maintained. No findings for septic arthritis. The bony structures are intact. No findings suspicious for osteomyelitis. There is subcutaneous soft tissue swelling/ edema/ fluid noted mainly posteriorly and laterally which is likely cellulitis. No focal rim enhancing fluid collection to suggest an abscess. No findings for myofasciitis or pyomyositis. No gas is seen in the soft tissues. Extensive subcutaneous calcifications possibly reflecting a collagen vascular disease such as dermatomyositis. IMPRESSION: Cellulitis without discrete drainable abscess. No findings for myofasciitis or pyomyositis. No findings for septic arthritis or osteomyelitis. Electronically Signed   By: Rudie MeyerP.  Gallerani M.D.   On: 08/02/2016 17:51    ____________________________________________    PROCEDURES  Procedure(s) performed:    Procedures    Medications  sodium chloride 0.9 % bolus 1,000 mL (1,000 mLs Intravenous New Bag/Given 08/02/16 1637)  clindamycin (CLEOCIN) IVPB 600 mg (0 mg Intravenous Stopped 08/02/16 1724)  ondansetron (ZOFRAN) injection 4 mg (4 mg Intravenous Given 08/02/16 1638)  morphine 4 MG/ML injection 4 mg (4 mg Intravenous Given 08/02/16 1638)   iopamidol (ISOVUE-300) 61 % injection 100 mL (100 mLs Intravenous Contrast Given 08/02/16 1720)     ____________________________________________   INITIAL IMPRESSION / ASSESSMENT AND PLAN / ED COURSE  Pertinent labs & imaging results that were available during my care of the patient were reviewed by me and considered in my medical decision making (see chart for details).  Review of the [place] CSRS was performed in accordance of the [facility] prior  to dispensing any controlled drugs.  Clinical Course     Patient's diagnosis is consistent with Cellulitis to the right lower leg. Patient presented to the emergency department with an open wound to the posterior right calf. According to the patient, she has had this for greater than 3 months. She has been seen by her primary care multiple times and has had multiple rounds of antibiotics without improvement. Patient reports that she has not been referred to the wound care center. She has significant comorbidities. Patient did have increased redness and swelling directly adjacent to open wound. As such, labs, imaging were undertaken. These returned with reassuring results for no acute abscess. Patient is given IV clindamycin in the emergency department. She will be discharged home with oral clindamycin, limited pain medication. Patient is to follow-up with wound care center and referral has been placed at this time..  Patient is given ED precautions to return to the ED for any worsening or new symptoms.     ____________________________________________  FINAL CLINICAL IMPRESSION(S) / ED DIAGNOSES  Final diagnoses:  Cellulitis  Infected open wound      NEW MEDICATIONS STARTED DURING THIS VISIT:  New Prescriptions   CLINDAMYCIN (CLEOCIN) 300 MG CAPSULE    Take 1 capsule (300 mg total) by mouth 4 (four) times daily.   TRAMADOL (ULTRAM) 50 MG TABLET    Take 1 tablet (50 mg total) by mouth every 6 (six) hours as needed.        This chart was  dictated using voice recognition software/Dragon. Despite best efforts to proofread, errors can occur which can change the meaning. Any change was purely unintentional.    Racheal Patches, PA-C 08/02/16 1906    Nita Sickle, MD 08/02/16 [id number]

## 2016-08-02 NOTE — ED Triage Notes (Signed)
Pt has been getting tx for small wound to the posterior right calf for the past 3 months, states she missed her apt today and they told her to come to the ed for eval due to the increased pain..Marland Kitchen

## 2016-08-07 LAB — CULTURE, BLOOD (ROUTINE X 2)
CULTURE: NO GROWTH
Culture: NO GROWTH

## 2016-08-09 ENCOUNTER — Ambulatory Visit: Payer: Medicaid Other | Admitting: Internal Medicine

## 2016-08-25 ENCOUNTER — Ambulatory Visit: Payer: Medicaid Other | Admitting: Nurse Practitioner

## 2016-09-05 ENCOUNTER — Encounter: Payer: Medicaid Other | Attending: Surgery | Admitting: Surgery

## 2016-09-05 DIAGNOSIS — I110 Hypertensive heart disease with heart failure: Secondary | ICD-10-CM | POA: Insufficient documentation

## 2016-09-05 DIAGNOSIS — E11622 Type 2 diabetes mellitus with other skin ulcer: Secondary | ICD-10-CM | POA: Diagnosis present

## 2016-09-05 DIAGNOSIS — Z8673 Personal history of transient ischemic attack (TIA), and cerebral infarction without residual deficits: Secondary | ICD-10-CM | POA: Insufficient documentation

## 2016-09-05 DIAGNOSIS — I509 Heart failure, unspecified: Secondary | ICD-10-CM | POA: Insufficient documentation

## 2016-09-05 DIAGNOSIS — Z6841 Body Mass Index (BMI) 40.0 and over, adult: Secondary | ICD-10-CM | POA: Diagnosis not present

## 2016-09-05 DIAGNOSIS — Z79899 Other long term (current) drug therapy: Secondary | ICD-10-CM | POA: Diagnosis not present

## 2016-09-05 DIAGNOSIS — F17218 Nicotine dependence, cigarettes, with other nicotine-induced disorders: Secondary | ICD-10-CM | POA: Diagnosis not present

## 2016-09-05 DIAGNOSIS — M797 Fibromyalgia: Secondary | ICD-10-CM | POA: Insufficient documentation

## 2016-09-05 DIAGNOSIS — I70232 Atherosclerosis of native arteries of right leg with ulceration of calf: Secondary | ICD-10-CM | POA: Diagnosis not present

## 2016-09-05 DIAGNOSIS — D649 Anemia, unspecified: Secondary | ICD-10-CM | POA: Diagnosis not present

## 2016-09-05 DIAGNOSIS — K219 Gastro-esophageal reflux disease without esophagitis: Secondary | ICD-10-CM | POA: Diagnosis not present

## 2016-09-05 DIAGNOSIS — G4730 Sleep apnea, unspecified: Secondary | ICD-10-CM | POA: Insufficient documentation

## 2016-09-05 DIAGNOSIS — M1990 Unspecified osteoarthritis, unspecified site: Secondary | ICD-10-CM | POA: Diagnosis not present

## 2016-09-05 DIAGNOSIS — J449 Chronic obstructive pulmonary disease, unspecified: Secondary | ICD-10-CM | POA: Diagnosis not present

## 2016-09-05 DIAGNOSIS — L97212 Non-pressure chronic ulcer of right calf with fat layer exposed: Secondary | ICD-10-CM | POA: Insufficient documentation

## 2016-09-05 DIAGNOSIS — E1140 Type 2 diabetes mellitus with diabetic neuropathy, unspecified: Secondary | ICD-10-CM | POA: Insufficient documentation

## 2016-09-05 DIAGNOSIS — Z794 Long term (current) use of insulin: Secondary | ICD-10-CM | POA: Insufficient documentation

## 2016-09-05 NOTE — Progress Notes (Addendum)
[name], Cynthia C. ([id number]) Visit Report for 09/05/2016 Chief Complaint Document Details Patient Name: [name], Cynthia C. Date of Service: 09/05/2016 9:30 AM Medical Record Number: [id number] Patient Account Number: [id number] Date of Birth/Sex: 01/26/1961 (55 y.o. 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 ([id number]) -------------------------------------------------------------------------------- HPI Details Patient Name: [name], Cynthia C. Date of Service: 09/05/2016 9:30 AM Medical Record Number: [id number] Patient Account Number: [id number] Date of Birth/Sex: 09/29/1960 ([age] y.o. Female) Treating RN: Ashok CordiaPinkerton, Debi Primary Care Physician: Sandrea HughsUBIO, JESSICA Other Clinician: Referring Physician: Nita SickleVERONESE, [place] Treating Physician/Extender: Rudene ReBritto, Schuyler Olden Weeks in Treatment: 0 History of Present Illness Location: right posterior calf Quality: Patient reports experiencing a sharp pain to affected area(s). Severity: Patient states wound are getting worse. Duration: Patient has had the wound for > 8 months prior to seeking treatment at the wound center Timing: Pain in wound is constant (hurts all the time) Context: The wound appeared gradually over time Modifying Factors: Other treatment(s) tried include:several courses of antibiotics have been tried including at the ER recently Associated Signs and Symptoms: Patient reports having increase swelling. HPI Description:  55 year old patient was seen about a month ago in the ER with a chronic wound to her right posterior calf which she had for 3 months. after workup a CT was also done and this showed cellulitis without a discrete drainable abscess and no evidence of mild fasciitis or pyomyositis. There Was also no septic arthritis or osteomyelitis. She was given injectable clindamycin and discharged home on oral clindamycin at that stage. Past medical history of diabetes mellitus, anemia, CHF, COPD, peripheral vascular disease and sleep apnea. She is known to have a carotid artery obstruction. she is status post coronary angioplasty with stents, carotid endarterectomy in 2016 on the left side by Dr. dew, right lower extremity angiography by Dr. dew in August 2016 with intervention. On review of the notes from her 05/27/2015 procedure by Dr. dew showed that she had peripheral artery disease with claudication bilateral lower extremity right greater than left. At that stage he did a right lower extremity angiogram with percutaneous angioplasty of the proximal and mid SFA, distal SFA and above- knee popliteal artery and placement of a stent in the mid to distal SFA and proximal popliteal artery. She smokes and has been doing this for several years. Electronic Signature(s) Signed: 09/05/2016 10:29:02 AM By: Evlyn KannerBritto, Ermel Verne MD, FACS Previous Signature: 09/05/2016 9:38:05 AM Version By: Evlyn KannerBritto, Geoffery Aultman MD, FACS Previous Signature: 09/05/2016 9:31:49 AM Version By: Evlyn KannerBritto, Ameliah Baskins MD, FACS Entered By: Evlyn KannerBritto, Varsha Knock on 09/05/2016 10:29:01 [name], Cynthia C. ([id number]) -------------------------------------------------------------------------------- Physical Exam Details Patient Name: [name], Cynthia C. Date of Service: 09/05/2016 9:30 AM Medical Record Number: [id number] Patient Account Number: [id number] Date of Birth/Sex: 07/28/1961 (55 y.o. Female) Treating RN: Ashok CordiaPinkerton, Debi Primary Care Physician: Sandrea HughsUBIO, JESSICA Other  Clinician: Referring Physician: Nita SickleVERONESE,  Treating Physician/Extender: Rudene ReBritto, Nazli Penn Weeks in Treatment: 0 Constitutional . Pulse regular. Respirations normal and unlabored. Afebrile. . Eyes Nonicteric. Reactive to light. Ears, Nose, Mouth, and Throat Lips, teeth, and gums WNL.Marland Kitchen. Moist mucosa without lesions. Neck supple and nontender. No palpable supraclavicular or  cervical adenopathy. Normal sized without goiter. Respiratory WNL. No retractions.. Cardiovascular Pedal Pulses WNL. ABI on the left was 1.0 on the right was 0.71. No clubbing, cyanosis or edema. Gastrointestinal (GI) Abdomen without masses or tenderness.. No liver or spleen enlargement or tenderness.. Lymphatic No adneopathy. No adenopathy. No adenopathy. Musculoskeletal Adexa without tenderness or enlargement.. Digits and nails w/o clubbing, cyanosis, infection, petechiae, ischemia, or inflammatory conditions.. Integumentary (Hair, Skin) No suspicious lesions. No crepitus or fluctuance. No peri-wound warmth or erythema. No masses.Marland Kitchen. Psychiatric Judgement and insight Intact.. No evidence of depression, anxiety, or agitation.. Notes she has got a area on the back of her right calf which is covered with slough and is extremely tender but there is no fluctuation or abscess. Debridement was not possible due to the tenderness Electronic Signature(s) Signed: 09/05/2016 10:47:57 AM By: Evlyn KannerBritto, Michaelah Credeur MD, FACS Entered By: Evlyn KannerBritto, Sylis Ketchum on 09/05/2016 10:47:56 Tedeschi, Barrie FolkVERNA C. ([id number]) -------------------------------------------------------------------------------- Physician Orders Details Patient Name: [name], Cynthia C. Date of Service: 09/05/2016 9:30 AM Medical Record Number: [id number] Patient Account Number: [id number] Date of Birth/Sex: 06/29/1961 (55 y.o. Female) Treating RN: Ashok CordiaPinkerton, Debi Primary Care Physician: Sandrea HughsUBIO, JESSICA Other Clinician: Referring Physician: Nita SickleVERONESE, [place] Treating  Physician/Extender: Rudene ReBritto, Cigi Bega Weeks in Treatment: 0 Verbal / Phone Orders: Yes Clinician: Pinkerton, Debi Read Back and Verified: Yes Diagnosis Coding Wound Cleansing Wound #1 Right,Posterior Lower Leg o Clean wound with Normal Saline. o Cleanse wound with mild soap and water Anesthetic Wound #1 Right,Posterior Lower Leg o Topical Lidocaine 4% cream applied to wound bed prior to debridement - for clinic use Skin Barriers/Peri-Wound Care Wound #1 Right,Posterior Lower Leg o Skin Prep Primary Wound Dressing Wound #1 Right,Posterior Lower Leg o Santyl Ointment o Medihoney gel - if pt is unable to get Santyl may use medihoney or manukahoney Secondary Dressing Wound #1 Right,Posterior Lower Leg o Dry Gauze o Non-adherent pad - telfa island Follow-up Appointments Wound #1 Right,Posterior Lower Leg o Return Appointment in 1 week. Edema Control Wound #1 Right,Posterior Lower Leg o Elevate legs to the level of the heart and pump ankles as often as possible Additional Orders / Instructions Wound #1 Right,Posterior Lower Leg o Stop Smoking o Increase protein intake. [name], Monserrath C. ([id number]) Medications-please add to medication list. Wound #1 Right,Posterior Lower Leg o Santyl Enzymatic Ointment o Other: - Vitamin C, Zinc, Multivitamin Consults o Vascular - Dr. Wyn Quakerew Patient Medications Allergies: niacin Notifications Medication Indication Start End Santyl 09/05/2016 DOSE topical 250 unit/gram ointment - ointment topical as directed Electronic Signature(s) Signed: 09/05/2016 3:08:15 PM By: Evlyn KannerBritto, Adelaido Nicklaus MD, FACS Signed: 09/06/2016 5:23:32 PM By: Alejandro MullingPinkerton, Debra Previous Signature: 09/05/2016 10:25:28 AM Version By: Evlyn KannerBritto, Rogue Rafalski MD, FACS Entered By: Alejandro MullingPinkerton, Debra on 09/05/2016 11:23:25 Stranahan, Barrie FolkVERNA C. ([id number]) -------------------------------------------------------------------------------- Problem List Details Patient Name: [name],  Cynthia C. Date of Service: 09/05/2016 9:30 AM Medical Record Number: [id number] Patient Account Number: [id number] Date of Birth/Sex: 08/14/1961 (55 y.o. 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 Date of Service: 09/05/2016 9:30 AM Medical Record Number: [id number] Patient Account Number: [id number] Date of Birth/Sex: 05-Jul-1961 (55 y.o. 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 Severity: Patient states wound are getting worse. Duration: Patient has had the wound for > 8 months prior to seeking treatment at the wound center Timing: Pain in wound is constant (hurts all the time) Context: The wound appeared gradually over time Modifying Factors: Other treatment(s) tried include:several courses of antibiotics have been tried including at the ER recently Associated Signs and Symptoms: Patient reports having increase swelling. 55 year old patient was seen about a month ago in the ER with a chronic wound to her right posterior calf which she had for 3 months. after workup a CT was also done and this showed cellulitis without a discrete drainable abscess and no evidence of mild fasciitis or pyomyositis. There Was also no septic arthritis or osteomyelitis. She was given injectable clindamycin and discharged home on oral clindamycin at that stage. Past medical history of diabetes mellitus, anemia, CHF, COPD, peripheral vascular disease and sleep apnea. She is known to have a carotid artery obstruction. she is status post coronary angioplasty with stents, carotid endarterectomy in 2016 on the left side by Dr. dew, right lower extremity angiography by Dr. dew in August 2016 with intervention. On review of the notes from her 05/27/2015 procedure by Dr. dew showed that she had peripheral artery disease with claudication bilateral lower extremity right greater than left. At that stage he did a right lower extremity angiogram with percutaneous angioplasty of the proximal and mid SFA, distal SFA and above- knee popliteal artery and placement of a stent in the mid to distal SFA and proximal popliteal artery. She smokes and has been doing this for several years. Wound History Patient presents with 1 open wound that has been present for approximately 8 months. Patient has been treating wound in the following manner: neopsorin. Laboratory  tests have not been performed in the last Julius, Urijah C. ([id number]) month. Patient reportedly has not tested positive for an antibiotic resistant organism. Patient reportedly has not tested positive for osteomyelitis. Patient reportedly has had testing performed to evaluate circulation in the legs. Patient experiences the following problems associated with their wounds: infection, swelling. Patient History Information obtained from Patient. Allergies niacin Family History Cancer - Paternal Grandparents, Maternal Grandparents, Diabetes - Father, Heart Disease - Father, Hypertension - Father, Lung Disease - Father, No family history of Hereditary Spherocytosis, Kidney Disease, Seizures, Stroke, Thyroid Problems, Tuberculosis. Social History Current every day smoker - 3/4 pack a day, Marital Status - Divorced, Alcohol Use - Never, Drug Use - No History, Caffeine Use - Daily. Medical History Hematologic/Lymphatic Patient has history of Anemia Respiratory Patient has history of Chronic Obstructive Pulmonary Disease (COPD), Sleep Apnea Cardiovascular Patient has history of Congestive Heart Failure, Coronary Artery Disease, Hypertension, Peripheral Venous Disease Endocrine Patient has  history of Type II Diabetes Musculoskeletal Patient has history of Osteoarthritis Neurologic Patient has history of Neuropathy Patient is treated with Insulin, Oral Agents. Blood sugar is not tested. Review of Systems (ROS) Constitutional Symptoms (General Health) The patient has no complaints or symptoms. Eyes Complains or has symptoms of Glasses / Contacts. Ear/Nose/Mouth/Throat The patient has no complaints or symptoms. Cardiovascular stroke Gastrointestinal GERD Genitourinary The patient has no complaints or symptoms. KELCY, LAIBLE ([id number]) Immunological The patient has no complaints or symptoms. Integumentary (Skin) Complains or has symptoms of  Wounds. Musculoskeletal fibromyalgia Oncologic The patient has no complaints or symptoms. Psychiatric Complains or has symptoms of Anxiety. Medications Santyl 250 unit/gram topical ointment topical ointment topical as directed Her medication lists includes carvedilol, insulin, enalapril, Clopidogrel, Lasix, Protonix, Januvia, metformin, glipizide, aspirin, trazodone, gabapentin, Colace, iron. Objective Constitutional Pulse regular. Respirations normal and unlabored. Afebrile. Vitals Time Taken: 9:26 AM, Height: 62 in, Source: Stated, Weight: 276 lbs, Source: Measured, BMI: 50.5, Temperature: 98.2 F, Pulse: 79 bpm, Respiratory Rate: 18 breaths/min, Blood Pressure: 119/62 mmHg. Eyes Nonicteric. Reactive to light. Ears, Nose, Mouth, and Throat Lips, teeth, and gums WNL.Marland Kitchen Moist mucosa without lesions. Neck supple and nontender. No palpable supraclavicular or cervical adenopathy. Normal sized without goiter. Respiratory WNL. No retractions.. Cardiovascular Pedal Pulses WNL. ABI on the left was 1.0 on the right was 0.71. No clubbing, cyanosis or edema. Gastrointestinal (GI) Polanco, [place] C. ([id number]) Abdomen without masses or tenderness.. No liver or spleen enlargement or tenderness.. Lymphatic No adneopathy. No adenopathy. No adenopathy. Musculoskeletal Adexa without tenderness or enlargement.. Digits and nails w/o clubbing, cyanosis, infection, petechiae, ischemia, or inflammatory conditions.Marland Kitchen Psychiatric Judgement and insight Intact.. No evidence of depression, anxiety, or agitation.. General Notes: she has got a area on the back of her right calf which is covered with slough and is extremely tender but there is no fluctuation or abscess. Debridement was not possible due to the tenderness Integumentary (Hair, Skin) No suspicious lesions. No crepitus or fluctuance. No peri-wound warmth or erythema. No masses.. Wound #1 status is Open. Original cause of wound was Gradually  Appeared. The wound is located on the Right,Posterior Lower Leg. The wound measures 0.9cm length x 1.5cm width x 0.1cm depth; 1.06cm^2 area and 0.106cm^3 volume. The wound is limited to skin breakdown. There is no tunneling or undermining noted. There is a large amount of serous drainage noted. The wound margin is distinct with the outline attached to the wound base. There is no granulation within the wound bed. There is a large (67-100%) amount of necrotic tissue within the wound bed including Adherent Slough. The periwound skin appearance exhibited: Localized Edema, Moist, Erythema. The surrounding wound skin color is noted with erythema which is circumferential. Periwound temperature was noted as No Abnormality. The periwound has tenderness on palpation. 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 ([id number]) 55 year old morbidly obese diabetic who is a smoker and has peripheral vascular disease and is rather noncompliant with her healthcare comes with a nonhealing wound of her right calf she's had for 8 months and has not seen her vascular surgeon in the near past. After review have recommended: 1. Santyl ointment to be applied to the wound with dressing  changes daily. 2. review by her vascular surgeons as soon as appointment is available 3. Good control of her diabetes mellitus with a recent A1c to be done 4. Spent 3 minutes discussing with her the need to completely give up smoking and I discussed the risks benefits and alternatives and methodology 5. regular visits the wound center Plan Wound Cleansing: Wound #1 Right,Posterior  Lower Leg: Clean wound with Normal Saline. Cleanse wound with mild soap and water Anesthetic: Wound #1 Right,Posterior Lower Leg: Topical Lidocaine 4% cream applied to wound bed prior to debridement - for clinic use Skin Barriers/Peri-Wound Care: Wound #1 Right,Posterior Lower Leg: Skin Prep Primary Wound Dressing: Wound #1 Right,Posterior Lower Leg: Santyl Ointment Medihoney gel - if pt is unable to get Santyl may use medihoney or manukahoney Secondary Dressing: Wound #1 Right,Posterior Lower Leg: Dry Gauze Non-adherent pad - telfa island Follow-up Appointments: Wound #1 Right,Posterior Lower Leg: Return Appointment in 1 week. Edema Control: Wound #1 Right,Posterior Lower Leg: Elevate legs to the level of the heart and pump ankles as often as possible Additional Orders / Instructions: Wound #1 Right,Posterior Lower Leg: Stop Smoking Increase protein intake. Medications-please add to medication list.: Wound #1 Right,Posterior Lower Leg: Santyl Enzymatic Ointment Other: - Vitamin C, Zinc, Multivitamin Consults ordered were: Vascular - Dr. Wyn Quaker The following medication(s) was prescribed: Santyl topical 250 unit/gram ointment ointment topical as directed starting 09/05/2016 CARDELIA, SASSANO ([id number]) 55 year old morbidly obese diabetic who is a smoker and has peripheral vascular disease and is rather noncompliant with her healthcare comes with a nonhealing wound of her right calf she's had for 8 months and has not seen her vascular surgeon in the near past. After review have recommended: 1. Santyl ointment to be applied to the wound with dressing changes daily. 2. review by her vascular surgeons as soon as appointment is available 3. Good control of her diabetes mellitus with a recent A1c to be done 4. Spent 3 minutes discussing with her the need to completely give up smoking and I discussed the risks benefits and alternatives and methodology 5. regular visits the wound  center Electronic Signature(s) Signed: 09/05/2016 3:10:13 PM By: Evlyn Kanner MD, FACS Previous Signature: 09/05/2016 10:51:09 AM Version By: Evlyn Kanner MD, FACS Entered By: Evlyn Kanner on 09/05/2016 15:10:13 Hedberg, Barrie Folk ([id number]) -------------------------------------------------------------------------------- ROS/PFSH Details Patient Name: [name] C. Date of Service: 09/05/2016 9:30 AM Medical Record Number: [id number] Patient Account Number: [id number] Date of Birth/Sex: 08-14-1961 (55 y.o. Female) Treating RN: Ashok Cordia, Debi Primary Care Physician: Sandrea Hughs Other Clinician: Referring Physician: Nita Sickle Treating Physician/Extender: Rudene Re in Treatment: 0 Information Obtained From Patient Wound History Do you currently have one or more open woundso Yes How many open wounds do you currently haveo 1 Approximately how long have you had your woundso 8 months How have you been treating your wound(s) until nowo neopsorin Has your wound(s) ever healed and then re-openedo No Have you had any lab work done in the past montho No Have you tested positive for an antibiotic resistant organism (MRSA, VRE)o No Have you tested positive for osteomyelitis (bone infection)o No Have you had any tests for circulation on your legso Yes Who ordered the testo Dr. Wyn Quaker Where was the test doneo [facility] Have you had other problems associated with your woundso Infection, Swelling Eyes Complaints and Symptoms: Positive for: Glasses / Contacts Integumentary (Skin) Complaints and Symptoms: Positive for: Wounds Psychiatric Complaints and Symptoms: Positive for: Anxiety Constitutional Symptoms (General Health) Complaints and Symptoms: No  Complaints or Symptoms Ear/Nose/Mouth/Throat Complaints and Symptoms: No Complaints or Symptoms Hematologic/Lymphatic Hatcher, Francine C. ([id number]) Medical History: Positive for: Anemia Respiratory Medical  History: Positive for: Chronic Obstructive Pulmonary Disease (COPD); Sleep Apnea Cardiovascular Complaints and Symptoms: Review of System Notes: stroke Medical History: Positive for: Congestive Heart Failure; Coronary Artery Disease; Hypertension; Peripheral Venous Disease Gastrointestinal Complaints and Symptoms: Review of System Notes: GERD Endocrine Medical History: Positive for: Type II Diabetes Time with diabetes: 15 years Treated with: Insulin, Oral agents Blood sugar tested every day: No Genitourinary Complaints and Symptoms: No Complaints or Symptoms Immunological Complaints and Symptoms: No Complaints or Symptoms Musculoskeletal Complaints and Symptoms: Review of System Notes: fibromyalgia Medical History: Positive for: Osteoarthritis [place], Pamella C. ([id number]) Neurologic Medical History: Positive for: Neuropathy Oncologic Complaints and Symptoms: No Complaints or Symptoms Immunizations Pneumococcal Vaccine: Received Pneumococcal Vaccination: No Family and Social History Cancer: Yes - Paternal Grandparents, Maternal Grandparents; Diabetes: Yes - Father; Heart Disease: Yes - Father; Hereditary Spherocytosis: No; Hypertension: Yes - Father; Kidney Disease: No; Lung Disease: Yes - Father; Seizures: No; Stroke: No; Thyroid Problems: No; Tuberculosis: No; Current every day smoker - 3/4 pack a day; Marital Status - Divorced; Alcohol Use: Never; Drug Use: No History; Caffeine Use: Daily; Financial Concerns: No; Food, Clothing or Shelter Needs: No; Support System Lacking: No; Transportation Concerns: No; Advanced Directives: No; Patient does not want information on Advanced Directives; Do not resuscitate: No; Living Will: No; Medical Power of Attorney: No Physician Affirmation I have reviewed and agree with the above information. Electronic Signature(s) Signed: 09/05/2016 3:08:15 PM By: Evlyn Kanner MD, FACS Signed: 09/06/2016 5:23:32 PM By: Alejandro Mulling Entered By: Evlyn Kanner on 09/05/2016 10:13:33 [place], Barrie Folk ([id number]) -------------------------------------------------------------------------------- SuperBill Details Patient Name: [name] C. Date of Service: 09/05/2016 Medical Record Number: [id number] Patient Account Number: [id number] Date of Birth/Sex: October 18, 1960 (55 y.o. 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 of calf F17.218 Nicotine dependence, cigarettes, with other nicotine-induced disorders E66.01 Morbid (severe) obesity due to excess calories Facility Procedures CPT4 Code Description: 62952841 99214 - WOUND CARE VISIT-LEV 4 EST PT Modifier: Quantity: 1 CPT4 Code Description: 32440102 99406-SMOKING CESSATION 3-10MINS ICD-10 Description Diagnosis E11.622 Type 2 diabetes mellitus with other skin ulcer L97.212 Non-pressure chronic ulcer of right calf with fat layer ex V25.366 Nicotine dependence,  cigarettes, with other nicotine-induc Modifier: posed ed disorder Quantity: 1 s Physician Procedures CPT4 Code Description: 4403474 99204 - WC PHYS LEVEL 4 - NEW PT ICD-10 Description Diagnosis E11.622 Type 2 diabetes mellitus with other skin ulcer L97.212 Non-pressure chronic ulcer of right calf with fat laye I70.232 Atherosclerosis of native arteries  of right leg with u E66.01 Morbid (severe) obesity due to excess calories Modifier: r exposed lceration of ca Quantity: 1 lf CPT4 Code Description: 99406 99406- SMOKING CESSATION 3-10 MINS ICD-10 Description Diagnosis E11.622 Type 2 diabetes mellitus with other skin  ulcer L97.212 Non-pressure chronic ulcer of right calf with fat laye Rasnic, Sanika C. ([id number]) Modifier: r exposed Quantity: 1 Electronic Signature(s) Signed: 09/05/2016 3:08:15 PM By: Evlyn Kanner MD, FACS Signed: 09/06/2016 5:23:32 PM By: Alejandro Mulling Previous Signature: 09/05/2016 10:51:37 AM Version By: Evlyn Kanner MD, FACS Entered By: Alejandro Mulling on 09/05/2016 11:29:31

## 2016-09-07 NOTE — Progress Notes (Signed)
[name], Cynthia C. ([id number]) Visit Report for 09/05/2016 Abuse/Suicide Risk Screen Details Patient Name: [name], Cynthia C. Date of Service: 09/05/2016 9:30 AM Medical Record Number: [id number] Patient Account Number: [id number] Date of Birth/Sex: 09/23/1961 (55 y.o. Female) Treating RN: Ashok CordiaPinkerton, Debi Primary Care Physician: Sandrea HughsUBIO, JESSICA Other Clinician: Referring Physician: Nita SickleVERONESE, [place] Treating Physician/Extender: Rudene ReBritto, Errol Weeks in Treatment: 0 Abuse/Suicide Risk Screen Items Answer ABUSE/SUICIDE RISK SCREEN: Has anyone close to you tried to hurt or harm you recentlyo No Do you feel uncomfortable with anyone in your familyo No Has anyone forced you do things that you didnot want to doo No Do you have any thoughts of harming yourselfo No Patient displays signs or symptoms of abuse and/or neglect. No Electronic Signature(s) Signed: 09/06/2016 5:23:32 PM By: Alejandro MullingPinkerton, Debra Entered By: Alejandro MullingPinkerton, Debra on 09/05/2016 09:47:03 Vester, Barrie FolkVERNA C. ([id number]) -------------------------------------------------------------------------------- Activities of Daily Living Details Patient Name: [name], [name] Date of Service: 09/05/2016 9:30 AM Medical Record Number: [id number] Patient Account Number: [id number] Date of Birth/Sex: 12/12/1960 (55 y.o. Female) Treating RN: Ashok CordiaPinkerton, Debi Primary Care Physician: Sandrea HughsUBIO, JESSICA Other Clinician: Referring Physician: Nita SickleVERONESE, Craig Treating Physician/Extender: Rudene ReBritto, Errol Weeks in Treatment: 0 Activities of Daily Living Items Answer Activities of Daily Living (Please select one for each item) Drive Automobile Completely Able Take Medications Completely Able Use Telephone Completely Able Care for Appearance Completely Able Use Toilet Completely Able Bath / Shower Completely Able Dress Self Completely Able Feed Self Completely Able Walk Completely Able Get In / Out Bed Completely Able Housework Completely Able Prepare Meals  Completely Able Handle Money Completely Able Shop for Self Completely Able Electronic Signature(s) Signed: 09/06/2016 5:23:32 PM By: Alejandro MullingPinkerton, Debra Entered By: Alejandro MullingPinkerton, Debra on 09/05/2016 09:47:22 Vuolo, Barrie FolkVERNA C. ([id number]) -------------------------------------------------------------------------------- Education Assessment Details Patient Name: Cynthia HeySUGGS, [name] Date of Service: 09/05/2016 9:30 AM Medical Record Number: [id number] Patient Account Number: [id number] Date of Birth/Sex: 05/04/1961 (55 y.o. Female) Treating RN: Ashok CordiaPinkerton, Debi Primary Care Physician: Sandrea HughsUBIO, JESSICA Other Clinician: Referring Physician: Nita SickleVERONESE, [place] Treating Physician/Extender: Rudene ReBritto, Errol Weeks in Treatment: 0 Primary Learner Assessed: Patient Learning Preferences/Education Level/Primary Language Learning Preference: Explanation, Printed Material Highest Education Level: High School Preferred Language: English Cognitive Barrier Assessment/Beliefs Language Barrier: No Translator Needed: No Memory Deficit: No Emotional Barrier: No Cultural/Religious Beliefs Affecting Medical No Care: Physical Barrier Assessment Impaired Vision: Yes Glasses Impaired Hearing: No Decreased Hand dexterity: No Knowledge/Comprehension Assessment Knowledge Level: High Comprehension Level: High Ability to understand written High instructions: Motivation Assessment Anxiety Level: Calm Cooperation: Cooperative Education Importance: Acknowledges Need Interest in Health Problems: Asks Questions Perception: Coherent Willingness to Engage in Self- High Management Activities: Readiness to Engage in Self- High Management Activities: Electronic Signature(s) Signed: 09/06/2016 5:23:32 PM By: San MorellePinkerton, Debra Uehara, Barrie FolkVERNA C. ([id number]) Entered By: Alejandro MullingPinkerton, Debra on 09/05/2016 09:47:43 Santillana, Barrie FolkVERNA C. ([id number]) -------------------------------------------------------------------------------- Fall Risk  Assessment Details Patient Name: [name], Cynthia C. Date of Service: 09/05/2016 9:30 AM Medical Record Number: [id number] Patient Account Number: [id number] Date of Birth/Sex: 09/03/1961 (55 y.o. Female) Treating RN: Ashok CordiaPinkerton, Debi Primary Care Physician: Sandrea HughsUBIO, JESSICA Other Clinician: Referring Physician: Nita SickleVERONESE,  Treating Physician/Extender: Rudene ReBritto, Errol Weeks in Treatment: 0 Fall Risk Assessment Items Have you had 2 or more falls in the last 12 monthso 0 No Have you had any fall that resulted in injury in the last 12 monthso 0 No FALL RISK ASSESSMENT: History of falling - immediate or within 3 months 25 Yes Secondary diagnosis 15 Yes Ambulatory aid None/bed rest/wheelchair/nurse 0 No Crutches/cane/walker 15 Yes Furniture 0 No IV Access/Saline Lock  0 No Gait/Training Normal/bed rest/immobile 0 No Weak 10 Yes Impaired 0 No Mental Status Oriented to own ability 0 Yes Electronic Signature(s) Signed: 09/06/2016 5:23:32 PM By: Alejandro MullingPinkerton, Debra Entered By: Alejandro MullingPinkerton, Debra on 09/05/2016 09:48:35 Gallaway, Barrie FolkVERNA C. ([id number]) -------------------------------------------------------------------------------- Foot Assessment Details Patient Name: Cynthia HockSUGGS, [name] Date of Service: 09/05/2016 9:30 AM Medical Record Number: [id number] Patient Account Number: [id number] Date of Birth/Sex: 08/05/1961 (55 y.o. Female) Treating RN: Ashok CordiaPinkerton, Debi Primary Care Physician: Sandrea HughsUBIO, JESSICA Other Clinician: Referring Physician: Nita SickleVERONESE, [place] Treating Physician/Extender: Rudene ReBritto, Errol Weeks in Treatment: 0 Foot Assessment Items Site Locations + = Sensation present, - = Sensation absent, C = Callus, U = Ulcer R = Redness, W = Warmth, M = Maceration, PU = Pre-ulcerative lesion F = Fissure, S = Swelling, D = Dryness Assessment Right: Left: Other Deformity: No No Prior Foot Ulcer: No No Prior Amputation: No No Charcot Joint: No No Ambulatory Status: Ambulatory With Help Assistance  Device: Cane Gait: Steady Electronic Signature(s) Signed: 09/06/2016 5:23:32 PM By: Alejandro MullingPinkerton, Debra Entered By: Alejandro MullingPinkerton, Debra on 09/05/2016 09:51:44 [place], Allessandra Salena Saner. ([id number]) -------------------------------------------------------------------------------- Nutrition Risk Assessment Details Patient Name: Holstein, Britteney C. Date of Service: 09/05/2016 9:30 AM Medical Record Number: [id number] Patient Account Number: [id number] Date of Birth/Sex: 09/13/1961 (55 y.o. Female) Treating RN: Ashok CordiaPinkerton, Debi Primary Care Physician: Sandrea HughsUBIO, JESSICA Other Clinician: Referring Physician: Nita SickleVERONESE, [place] Treating Physician/Extender: Rudene ReBritto, Errol Weeks in Treatment: 0 Height (in): 62 Weight (lbs): 276 Body Mass Index (BMI): 50.5 Nutrition Risk Assessment Items NUTRITION RISK SCREEN: I have an illness or condition that made me change the kind and/or 0 No amount of food I eat I eat fewer than two meals per day 0 No I eat few fruits and vegetables, or milk products 0 No I have three or more drinks of beer, liquor or wine almost every day 0 No I have tooth or mouth problems that make it hard for me to eat 0 No I don't always have enough money to buy the food I need 0 No I eat alone most of the time 0 No I take three or more different prescribed or over-the-counter drugs a 1 Yes day Without wanting to, I have lost or gained 10 pounds in the last six 2 Yes months I am not always physically able to shop, cook and/or feed myself 0 No Nutrition Protocols Good Risk Protocol Moderate Risk Protocol Electronic Signature(s) Signed: 09/06/2016 5:23:32 PM By: Alejandro MullingPinkerton, Debra Entered By: Alejandro MullingPinkerton, Debra on 09/05/2016 09:48:55

## 2016-09-07 NOTE — Progress Notes (Signed)
[name], [name] ([id number]) Visit Report for 09/05/2016 Allergy List Details Patient Name: [name], [name]. Date of Service: 09/05/2016 9:30 AM Medical Record Number: [id number] Patient Account Number: [id number] Date of Birth/Sex: 15-Jun-1961 (55 y.o. Female) Treating RN: Carolyne Fiscal, Debi Primary Care Physician: Freddy Finner Other Clinician: Referring Physician: Rudene Re Treating Physician/Extender: Frann Rider in Treatment: 0 Allergies Active Allergies niacin Allergy Notes Electronic Signature(s) Signed: 09/06/2016 5:23:32 PM By: Alric Quan Entered By: Alric Quan on 09/05/2016 09:30:36 [name], [name] ([id number]) -------------------------------------------------------------------------------- Arrival Information Details Patient Name: [name]. Date of Service: 09/05/2016 9:30 AM Medical Record Number: [id number] Patient Account Number: [id number] Date of Birth/Sex: November 29, 1960 (55 y.o. Female) Treating RN: Carolyne Fiscal, Debi Primary Care Physician: Freddy Finner Other Clinician: Referring Physician: Rudene Re Treating Physician/Extender: Frann Rider in Treatment: 0 Visit Information Patient Arrived: Wheel Chair Arrival Time: 09:24 Accompanied By: daughter Transfer Assistance: EasyPivot Patient Lift Patient Identification Verified: Yes Secondary Verification Process Yes Completed: Patient Requires Transmission- No Based Precautions: Patient Has Alerts: Yes Patient Alerts: Patient on Blood Thinner DM II Plavix Electronic Signature(s) Signed: 09/06/2016 5:23:32 PM By: Alric Quan Entered By: Alric Quan on 09/05/2016 09:25:44 [name], [name] ([id number]) -------------------------------------------------------------------------------- Clinic Level of Care Assessment Details Patient Name: [name], [name]. Date of Service: 09/05/2016 9:30 AM Medical Record Number: [id number] Patient Account Number: [id number] Date  of Birth/Sex: 1961-02-11 (55 y.o. Female) Treating RN: Carolyne Fiscal, Debi Primary Care Physician: Freddy Finner Other Clinician: Referring Physician: Rudene Re Treating Physician/Extender: Frann Rider in Treatment: 0 Clinic Level of Care Assessment Items TOOL 2 Quantity Score X - Use when only an EandM is performed on the INITIAL visit 1 0 ASSESSMENTS - Nursing Assessment / Reassessment X - General Physical Exam (combine w/ comprehensive assessment (listed just 1 20 below) when performed on new pt. evals) X - Comprehensive Assessment (HX, ROS, Risk Assessments, Wounds Hx, etc.) 1 25 ASSESSMENTS - Wound and Skin Assessment / Reassessment X - Simple Wound Assessment / Reassessment - one wound 1 5 []  - Complex Wound Assessment / Reassessment - multiple wounds 0 []  - Dermatologic / Skin Assessment (not related to wound area) 0 ASSESSMENTS - Ostomy and/or Continence Assessment and Care []  - Incontinence Assessment and Management 0 []  - Ostomy Care Assessment and Management (repouching, etc.) 0 PROCESS - Coordination of Care []  - Simple Patient / Family Education for ongoing care 0 X - Complex (extensive) Patient / Family Education for ongoing care 1 20 X - Staff obtains Programmer, systems, Records, Test Results / Process Orders 1 10 []  - Staff telephones HHA, Nursing Homes / Clarify orders / etc 0 []  - Routine Transfer to another Facility (non-emergent condition) 0 []  - Routine Hospital Admission (non-emergent condition) 0 X - New Admissions / Biomedical engineer / Ordering NPWT, Apligraf, etc. 1 15 []  - Emergency Hospital Admission (emergent condition) 0 X - Simple Discharge Coordination 1 10 [name], [name]. ([id number]) []  - Complex (extensive) Discharge Coordination 0 PROCESS - Special Needs []  - Pediatric / Minor Patient Management 0 []  - Isolation Patient Management 0 []  - Hearing / Language / Visual special needs 0 []  - Assessment of Community assistance (transportation,  D/Gray planning, etc.) 0 []  - Additional assistance / Altered mentation 0 []  - Support Surface(s) Assessment (bed, cushion, seat, etc.) 0 INTERVENTIONS - Wound Cleansing / Measurement X - Wound Imaging (photographs - any number of wounds) 1 5 []  - Wound Tracing (instead of photographs) 0 X - Simple Wound Measurement - one wound 1 5 []  -  Complex Wound Measurement - multiple wounds 0 X - Simple Wound Cleansing - one wound 1 5 []  - Complex Wound Cleansing - multiple wounds 0 INTERVENTIONS - Wound Dressings X - Small Wound Dressing one or multiple wounds 1 10 []  - Medium Wound Dressing one or multiple wounds 0 []  - Large Wound Dressing one or multiple wounds 0 []  - Application of Medications - injection 0 INTERVENTIONS - Miscellaneous []  - External ear exam 0 []  - Specimen Collection (cultures, biopsies, blood, body fluids, etc.) 0 []  - Specimen(s) / Culture(s) sent or taken to Lab for analysis 0 []  - Patient Transfer (multiple staff / Harrel Lemon Lift / Similar devices) 0 []  - Simple Staple / Suture removal (25 or less) 0 []  - Complex Staple / Suture removal (26 or more) 0 [name], [name]. ([id number]) []  - Hypo / Hyperglycemic Management (close monitor of Blood Glucose) 0 X - Ankle / Brachial Index (ABI) - do not check if billed separately 1 15 Has the patient been seen at the hospital within the last three years: Yes Total Score: 145 Level Of Care: New/Established - Level 4 Electronic Signature(s) Signed: 09/06/2016 5:23:32 PM By: Alric Quan Entered By: Alric Quan on 09/05/2016 11:29:17 [name], [name] ([id number]) -------------------------------------------------------------------------------- Encounter Discharge Information Details Patient Name: [name] Gray. Date of Service: 09/05/2016 9:30 AM Medical Record Number: [id number] Patient Account Number: [id number] Date of Birth/Sex: [birth year] (55 y.o. Female) Treating RN: Carolyne Fiscal, Debi Primary Care Physician: Freddy Finner Other Clinician: Referring Physician: Rudene Re Treating Physician/Extender: Frann Rider in Treatment: 0 Encounter Discharge Information Items Discharge Pain Level: 0 Discharge Condition: Stable Ambulatory Status: Wheelchair Discharge Destination: Home Transportation: Private Auto Accompanied By: daughter Schedule Follow-up Appointment: Yes Medication Reconciliation completed and provided to Patient/Care No Yulissa Needham: Provided on Clinical Summary of Care: 09/05/2016 Form Type Recipient Paper Patient VS Electronic Signature(s) Signed: 09/05/2016 10:37:02 AM By: Ruthine Dose Entered By: Ruthine Dose on 09/05/2016 10:37:02 [name], [name] ([id number]) -------------------------------------------------------------------------------- Lower Extremity Assessment Details Patient Name: [name], [name]. Date of Service: 09/05/2016 9:30 AM Medical Record Number: [id number] Patient Account Number: [id number] Date of Birth/Sex: [birth year] (55 y.o. Female) Treating RN: Carolyne Fiscal, Debi Primary Care Physician: Freddy Finner Other Clinician: Referring Physician: Rudene Re Treating Physician/Extender: Frann Rider in Treatment: 0 Edema Assessment Assessed: [Left: No] [Right: No] Edema: [Left: Yes] [Right: Yes] Calf Left: Right: Point of Measurement: 28 cm From Medial Instep 43.6 cm 44 cm Ankle Left: Right: Point of Measurement: 11 cm From Medial Instep 24.6 cm 24.4 cm Vascular Assessment Pulses: Posterior Tibial Palpable: [Left:No] [Right:No] Doppler: [Left:Multiphasic] [Right:Monophasic] Dorsalis Pedis Palpable: [Left:No] [Right:No] Doppler: [Left:Multiphasic] [Right:Monophasic] Extremity colors, hair growth, and conditions: Extremity Color: [Left:Normal] [Right:Normal] Temperature of Extremity: [Left:Warm] [Right:Warm] Capillary Refill: [Left:< 3 seconds] [Right:< 3 seconds] Blood Pressure: Brachial: [Left:102] [Right:102] Dorsalis  Pedis: 100 [Left:Dorsalis Pedis: 70] Ankle: Posterior Tibial: 102 [Left:Posterior Tibial: 72 1.00] [Right:0.71] Toe Nail Assessment Left: Right: Thick: Yes Yes Discolored: Yes Yes Deformed: Yes Yes Improper Length and Hygiene: No No Zimmers, Wylie Gray. ([id number]) Electronic Signature(s) Signed: 09/06/2016 5:23:32 PM By: Alric Quan Entered By: Alric Quan on 09/05/2016 10:04:46 Kemp, Amiri Gray. ([id number]) -------------------------------------------------------------------------------- Multi Wound Chart Details Patient Name: [name] Gray. Date of Service: 09/05/2016 9:30 AM Medical Record Number: [id number] Patient Account Number: [id number] Date of Birth/Sex: May 08, 1961 (55 y.o. Female) Treating RN: Carolyne Fiscal, Debi Primary Care Physician: Freddy Finner Other Clinician: Referring Physician: Rudene Re Treating Physician/Extender: Frann Rider in Treatment: 0 Vital Signs Height(in): 62 Pulse(bpm): 79  Weight(lbs): 276 Blood Pressure 119/62 (mmHg): Body Mass Index(BMI): 50 Temperature(F): 98.2 Respiratory Rate 18 (breaths/min): Photos: [1:No Photos] [N/A:N/A] Wound Location: [1:Right Lower Leg - Posterior] [N/A:N/A] Wounding Event: [1:Gradually Appeared] [N/A:N/A] Primary Etiology: [1:Diabetic Wound/Ulcer of N/A the Lower Extremity] Comorbid History: [1:Anemia, Chronic Obstructive Pulmonary Disease (COPD), Sleep Apnea, Congestive Heart Failure, Coronary Artery Disease, Hypertension, Peripheral Venous Disease, Type II Diabetes, Osteoarthritis, Neuropathy] [N/A:N/A] Date Acquired: [1:01/05/2016] [N/A:N/A] Weeks of Treatment: [1:0] [N/A:N/A] Wound Status: [1:Open] [N/A:N/A] Measurements L x W x D 0.9x1.5x0.1 [N/A:N/A] (cm) Area (cm) : [1:1.06] [N/A:N/A] Volume (cm) : [1:0.106] [N/A:N/A] % Reduction in Area: [1:0.00%] [N/A:N/A] % Reduction in Volume: 0.00% [N/A:N/A] Classification: [1:Grade 1] [N/A:N/A] Exudate Amount: [1:Large]  [N/A:N/A] Exudate Type: [1:Serous] [N/A:N/A] Exudate Color: [1:amber] [N/A:N/A] Wound Margin: [1:Distinct, outline attached N/A] Granulation Amount: [1:None Present (0%)] [N/A:N/A] Necrotic Amount: Large (67-100%) N/A N/A Exposed Structures: Fascia: No N/A N/A Fat: No Tendon: No Muscle: No Joint: No Bone: No Limited to Skin Breakdown Epithelialization: None N/A N/A Periwound Skin Texture: Edema: Yes N/A N/A Periwound Skin Moist: Yes N/A N/A Moisture: Periwound Skin Color: Erythema: Yes N/A N/A Erythema Location: Circumferential N/A N/A Temperature: No Abnormality N/A N/A Tenderness on Yes N/A N/A Palpation: Wound Preparation: Ulcer Cleansing: N/A N/A Rinsed/Irrigated with Saline Topical Anesthetic Applied: Other: lidocaine 4% Treatment Notes Electronic Signature(s) Signed: 09/06/2016 5:23:32 PM By: Alric Quan Entered By: Alric Quan on 09/05/2016 10:19:25 [name] ([id number]) -------------------------------------------------------------------------------- [place] Details Patient Name: [name]. Date of Service: 09/05/2016 9:30 AM Medical Record Number: [id number] Patient Account Number: [id number] Date of Birth/Sex: 1961-01-14 (55 y.o. Female) Treating RN: Carolyne Fiscal, Debi Primary Care Physician: Freddy Finner Other Clinician: Referring Physician: Rudene Re Treating Physician/Extender: Frann Rider in Treatment: 0 Active Inactive Abuse / Safety / Falls / Self Care Management Nursing Diagnoses: Potential for falls Goals: Patient will remain injury free Date Initiated: 09/05/2016 Goal Status: Active Interventions: Assess fall risk on admission and as needed Assess impairment of mobility on admission and as needed per policy Notes: Nutrition Nursing Diagnoses: Imbalanced nutrition Impaired glucose control: actual or potential Goals: Patient/caregiver agrees to and verbalizes understanding of need  to use nutritional supplements and/or vitamins as prescribed Date Initiated: 09/05/2016 Goal Status: Active Patient/caregiver verbalizes understanding of need to maintain therapeutic glucose control per primary care physician Date Initiated: 09/05/2016 Goal Status: Active Patient/caregiver will maintain therapeutic glucose control Date Initiated: 09/05/2016 Goal Status: Active Interventions: Assess patient nutrition upon admission and as needed per policy Mckendree, Lessie Gray. ([id number]) Provide education on elevated blood sugars and impact on wound healing Provide education on nutrition Notes: Orientation to the Wound Care Program Nursing Diagnoses: Knowledge deficit related to the wound healing center program Goals: Patient/caregiver will verbalize understanding of the [place] Program Date Initiated: 09/05/2016 Goal Status: Active Interventions: Provide education on orientation to the wound center Notes: Pain, Acute or Chronic Nursing Diagnoses: Pain, acute or chronic: actual or potential Potential alteration in comfort, pain Goals: Patient will verbalize adequate pain control and receive pain control interventions during procedures as needed Date Initiated: 09/05/2016 Goal Status: Active Patient/caregiver will verbalize adequate pain control between visits Date Initiated: 09/05/2016 Goal Status: Active Patient/caregiver will verbalize comfort level met Date Initiated: 09/05/2016 Goal Status: Active Interventions: Assess comfort goal upon admission Complete pain assessment as per visit requirements Notes: Wound/Skin Impairment Nursing Diagnoses: [name], FELLENZ. ([id number]) Impaired tissue integrity Knowledge deficit related to smoking impact on wound healing Knowledge deficit related to ulceration/compromised skin integrity Goals: Ulcer/skin breakdown  will have a volume reduction of 30% by week 4 Date Initiated: 09/05/2016 Goal Status:  Active Ulcer/skin breakdown will have a volume reduction of 50% by week 8 Date Initiated: 09/05/2016 Goal Status: Active Ulcer/skin breakdown will have a volume reduction of 80% by week 12 Date Initiated: 09/05/2016 Goal Status: Active Interventions: Assess patient/caregiver ability to perform ulcer/skin care regimen upon admission and as needed Assess ulceration(s) every visit Provide education on smoking Notes: Electronic Signature(s) Signed: 09/06/2016 5:23:32 PM By: Alric Quan Entered By: Alric Quan on 09/05/2016 10:18:44 Seder, [name] ([id number]) -------------------------------------------------------------------------------- Pain Assessment Details Patient Name: [name] Gray. Date of Service: 09/05/2016 9:30 AM Medical Record Number: [id number] Patient Account Number: [id number] Date of Birth/Sex: Jul 27, 1961 (55 y.o. Female) Treating RN: Carolyne Fiscal, Debi Primary Care Physician: Freddy Finner Other Clinician: Referring Physician: Rudene Re Treating Physician/Extender: Frann Rider in Treatment: 0 Active Problems Location of Pain Severity and Description of Pain Patient Has Paino Yes Site Locations Pain Location: Pain in Ulcers With Dressing Change: Yes Duration of the Pain. Constant / Intermittento Constant Rate the pain. Current Pain Level: 5 Worst Pain Level: 8 Least Pain Level: 2 Character of Pain Describe the Pain: Burning, Tender Pain Management and Medication Current Pain Management: Electronic Signature(s) Signed: 09/06/2016 5:23:32 PM By: Alric Quan Entered By: Alric Quan on 09/05/2016 09:26:35 Dehart, [name] ([id number]) -------------------------------------------------------------------------------- Patient/Caregiver Education Details Patient Name: [name]. Date of Service: 09/05/2016 9:30 AM Medical Record Number: [id number] Patient Account Number: [id number] Date of Birth/Gender: 1960/11/09 (55 y.o.  Female) Treating RN: Carolyne Fiscal, Debi Primary Care Physician: Freddy Finner Other Clinician: Referring Physician: Rudene Re Treating Physician/Extender: Frann Rider in Treatment: 0 Education Assessment Education Provided To: Patient Education Topics Provided Elevated Blood Sugar/ Impact on Healing: Handouts: Elevated Blood Sugars: How Do They Affect Wound Healing Methods: Explain/Verbal Responses: State content correctly Nutrition: Handouts: Elevated Blood Sugars: How Do They Affect Wound Healing Methods: Explain/Verbal Responses: State content correctly Smoking and Wound Healing: Handouts: Smoking and Wound Healing Methods: Explain/Verbal, Printed Responses: State content correctly Welcome To The [place]: Handouts: Welcome To The [place] Methods: Explain/Verbal, Printed Responses: State content correctly Wound/Skin Impairment: Handouts: Other: change dressing as ordered Methods: Demonstration, Explain/Verbal Responses: State content correctly Electronic Signature(s) Signed: 09/06/2016 5:23:32 PM By: Alric Quan Entered By: Alric Quan on 09/05/2016 10:20:35 Groner, [name] ([id number]) -------------------------------------------------------------------------------- Wound Assessment Details Patient Name: [name], [name]. Date of Service: 09/05/2016 9:30 AM Medical Record Number: [id number] Patient Account Number: [id number] Date of Birth/Sex: 16-Nov-1960 (55 y.o. Female) Treating RN: Carolyne Fiscal, Debi Primary Care Physician: Freddy Finner Other Clinician: Referring Physician: Rudene Re Treating Physician/Extender: Frann Rider in Treatment: 0 Wound Status Wound Number: 1 Primary Diabetic Wound/Ulcer of the Lower Etiology: Extremity Wound Location: Right Lower Leg - Posterior Wound Open Wounding Event: Gradually Appeared Status: Date Acquired: 01/05/2016 Comorbid Anemia, Chronic Obstructive  Pulmonary Weeks Of Treatment: 0 History: Disease (COPD), Sleep Apnea, Clustered Wound: No Congestive Heart Failure, Coronary Artery Disease, Hypertension, Peripheral Venous Disease, Type II Diabetes, Osteoarthritis, Neuropathy Photos Photo Uploaded By: Alric Quan on 09/05/2016 10:39:50 Wound Measurements Length: (cm) 0.9 Width: (cm) 1.5 Depth: (cm) 0.1 Area: (cm) 1.06 Volume: (cm) 0.106 % Reduction in Area: 0% % Reduction in Volume: 0% Epithelialization: None Tunneling: No Undermining: No Wound Description Classification: Grade 1 Foul Odor Aft Wound Margin: Distinct, outline attached Exudate Amount: Large Exudate Type: Serous Exudate Color: amber er Cleansing: No Wound Bed Granulation Amount: None Present (0%) Exposed Structure Koskela, Analyse Gray. (  [id number]) Necrotic Amount: Large (67-100%) Fascia Exposed: No Necrotic Quality: Adherent Slough Fat Layer Exposed: No Tendon Exposed: No Muscle Exposed: No Joint Exposed: No Bone Exposed: No Limited to Skin Breakdown Periwound Skin Texture Texture Color No Abnormalities Noted: No No Abnormalities Noted: No Localized Edema: Yes Erythema: Yes Erythema Location: Circumferential Moisture No Abnormalities Noted: No Temperature / Pain Moist: Yes Temperature: No Abnormality Tenderness on Palpation: Yes Wound Preparation Ulcer Cleansing: Rinsed/Irrigated with Saline Topical Anesthetic Applied: Other: lidocaine 4%, Treatment Notes Wound #1 (Right, Posterior Lower Leg) 1. Cleansed with: Clean wound with Normal Saline 2. Anesthetic Topical Lidocaine 4% cream to wound bed prior to debridement 4. Dressing Applied: Santyl Ointment 5. Secondary Dressing Applied Dry [place] Signature(s) Signed: 09/06/2016 5:23:32 PM By: Alric Quan Entered By: Alric Quan on 09/05/2016 10:10:20 Renwick, [name]  ([id number]) -------------------------------------------------------------------------------- [place] Details Patient Name: [name] Gray. Date of Service: 09/05/2016 9:30 AM Medical Record Number: [id number] Patient Account Number: [id number] Date of Birth/Sex: August 06, 1961 (55 y.o. Female) Treating RN: Carolyne Fiscal, Debi Primary Care Physician: Freddy Finner Other Clinician: Referring Physician: Rudene Re Treating Physician/Extender: Frann Rider in Treatment: 0 Vital Signs Time Taken: 09:26 Temperature (F): 98.2 Height (in): 62 Pulse (bpm): 79 Source: Stated Respiratory Rate (breaths/min): 18 Weight (lbs): 276 Blood Pressure (mmHg): 119/62 Source: Measured Reference Range: 80 - 120 mg / dl Body Mass Index (BMI): 50.5 Electronic Signature(s) Signed: 09/06/2016 5:23:32 PM By: Alric Quan Entered By: Alric Quan on 09/05/2016 18:36:72

## 2016-09-09 ENCOUNTER — Encounter (INDEPENDENT_AMBULATORY_CARE_PROVIDER_SITE_OTHER): Payer: Self-pay | Admitting: Vascular Surgery

## 2016-09-09 ENCOUNTER — Ambulatory Visit (INDEPENDENT_AMBULATORY_CARE_PROVIDER_SITE_OTHER): Payer: Medicaid Other | Admitting: Vascular Surgery

## 2016-09-09 VITALS — BP 109/72 | HR 84 | Resp 17 | Ht 62.0 in | Wt 268.0 lb

## 2016-09-09 DIAGNOSIS — E118 Type 2 diabetes mellitus with unspecified complications: Secondary | ICD-10-CM | POA: Diagnosis not present

## 2016-09-09 DIAGNOSIS — F17200 Nicotine dependence, unspecified, uncomplicated: Secondary | ICD-10-CM | POA: Diagnosis not present

## 2016-09-09 DIAGNOSIS — I6523 Occlusion and stenosis of bilateral carotid arteries: Secondary | ICD-10-CM | POA: Diagnosis not present

## 2016-09-09 DIAGNOSIS — I10 Essential (primary) hypertension: Secondary | ICD-10-CM

## 2016-09-09 DIAGNOSIS — I7025 Atherosclerosis of native arteries of other extremities with ulceration: Secondary | ICD-10-CM

## 2016-09-12 ENCOUNTER — Other Ambulatory Visit (INDEPENDENT_AMBULATORY_CARE_PROVIDER_SITE_OTHER): Payer: Self-pay | Admitting: Vascular Surgery

## 2016-09-12 ENCOUNTER — Encounter: Payer: Medicaid Other | Admitting: Surgery

## 2016-09-12 DIAGNOSIS — E11622 Type 2 diabetes mellitus with other skin ulcer: Secondary | ICD-10-CM | POA: Diagnosis not present

## 2016-09-12 DIAGNOSIS — I739 Peripheral vascular disease, unspecified: Secondary | ICD-10-CM

## 2016-09-12 NOTE — Progress Notes (Signed)
Cynthia, Gray ([id number]) Visit Report for 09/12/2016 Arrival Information Details Patient Name: [name], [name]. Date of Service: 09/12/2016 12:30 PM Medical Record Number: [id number] Patient Account Number: [id number] Date of Birth/Sex: Dec 08, 1960 (55 y.o. Female) Treating RN: Carolyne Fiscal, Debi Primary Care Physician: Freddy Finner Other Clinician: Referring Physician: Freddy Finner Treating Physician/Extender: Frann Rider in Treatment: 1 Visit Information History Since Last Visit All ordered tests and consults were completed: No Patient Arrived: Wheel Chair Added or deleted any medications: No Arrival Time: 12:42 Any new allergies or adverse reactions: No Accompanied By: daughter Had a fall or experienced change in No Transfer Assistance: EasyPivot Patient activities of daily living that may affect Lift risk of falls: Patient Identification Verified: Yes Signs or symptoms of abuse/neglect since last No Secondary Verification Process Yes visito Completed: Hospitalized since last visit: No Patient Requires Transmission- No Has Dressing in Place as Prescribed: Yes Based Precautions: Pain Present Now: No Patient Has Alerts: Yes Patient Alerts: Patient on Blood Thinner DM II Plavix Electronic Signature(s) Signed: 09/12/2016 4:07:29 PM By: Alric Quan Entered By: Alric Quan on 09/12/2016 12:43:46 Mcdowell, Damita Dunnings ([id number]) -------------------------------------------------------------------------------- Encounter Discharge Information Details Patient Name: Cynthia Aquas C. Date of Service: 09/12/2016 12:30 PM Medical Record Number: [id number] Patient Account Number: [id number] Date of Birth/Sex: August 19, 1961 (55 y.o. Female) Treating RN: Carolyne Fiscal, Debi Primary Care Physician: Freddy Finner Other Clinician: Referring Physician: Freddy Finner Treating Physician/Extender: Frann Rider in Treatment: 1 Encounter Discharge Information  Items Discharge Pain Level: 0 Discharge Condition: Stable Ambulatory Status: Wheelchair Discharge Destination: Home Transportation: Private Auto Accompanied By: daughter Schedule Follow-up Appointment: Yes Medication Reconciliation completed and provided to Patient/Care Yes Vidal Lampkins: Provided on Clinical Summary of Care: 09/12/2016 Form Type Recipient Paper Patient VS Electronic Signature(s) Signed: 09/12/2016 1:16:02 PM By: Ruthine Dose Entered By: Ruthine Dose on 09/12/2016 13:16:02 Bailey, Damita Dunnings ([id number]) -------------------------------------------------------------------------------- Lower Extremity Assessment Details Patient Name: Cynthia Doe, [name] Date of Service: 09/12/2016 12:30 PM Medical Record Number: [id number] Patient Account Number: [id number] Date of Birth/Sex: 1961/03/21 (55 y.o. Female) Treating RN: Carolyne Fiscal, Debi Primary Care Physician: Freddy Finner Other Clinician: Referring Physician: Freddy Finner Treating Physician/Extender: Frann Rider in Treatment: 1 Vascular Assessment Pulses: Dorsalis Pedis Palpable: [Right:No] Posterior Tibial Extremity colors, hair growth, and conditions: Extremity Color: [Right:Normal] Temperature of Extremity: [Right:Warm] Capillary Refill: [Right:< 3 seconds] Toe Nail Assessment Left: Right: Thick: Yes Discolored: Yes Deformed: Yes Improper Length and Hygiene: No Electronic Signature(s) Signed: 09/12/2016 4:07:29 PM By: Alric Quan Entered By: Alric Quan on 09/12/2016 12:47:21 Catanzaro, Casee C. ([id number]) -------------------------------------------------------------------------------- Multi Wound Chart Details Patient Name: Cynthia Aquas C. Date of Service: 09/12/2016 12:30 PM Medical Record Number: [id number] Patient Account Number: [id number] Date of Birth/Sex: [birth year] (55 y.o. Female) Treating RN: Carolyne Fiscal, Debi Primary Care Physician: Freddy Finner Other Clinician: Referring  Physician: Freddy Finner Treating Physician/Extender: Frann Rider in Treatment: 1 Vital Signs Height(in): 62 Pulse(bpm): 86 Weight(lbs): 276 Blood Pressure 113/72 (mmHg): Body Mass Index(BMI): 50 Temperature(F): 98.3 Respiratory Rate 18 (breaths/min): Photos: [1:No Photos] [N/A:N/A] Wound Location: [1:Right Lower Leg - Posterior] [N/A:N/A] Wounding Event: [1:Gradually Appeared] [N/A:N/A] Primary Etiology: [1:Diabetic Wound/Ulcer of N/A the Lower Extremity] Comorbid History: [1:Anemia, Chronic Obstructive Pulmonary Disease (COPD), Sleep Apnea, Congestive Heart Failure, Coronary Artery Disease, Hypertension, Peripheral Venous Disease, Type II Diabetes, Osteoarthritis, Neuropathy] [N/A:N/A] Date Acquired: [1:01/05/2016] [N/A:N/A] Weeks of Treatment: [1:1] [N/A:N/A] Wound Status: [1:Open] [N/A:N/A] Measurements L x W x D 0.8x1.4x0.2 [N/A:N/A] (cm) Area (cm) : [1:0.88] [N/A:N/A] Volume (cm) : [1:0.176] [N/A:N/A] % Reduction in Area: [1:17.00%] [  N/A:N/A] % Reduction in Volume: -66.00% [N/A:N/A] Classification: [1:Grade 1] [N/A:N/A] Exudate Amount: [1:Large] [N/A:N/A] Exudate Type: [1:Serous] [N/A:N/A] Exudate Color: [1:amber] [N/A:N/A] Wound Margin: [1:Distinct, outline attached N/A] Granulation Amount: [1:None Present (0%)] [N/A:N/A] Necrotic Amount: Large (67-100%) N/A N/A Exposed Structures: Fascia: No N/A N/A Fat: No Tendon: No Muscle: No Joint: No Bone: No Limited to Skin Breakdown Epithelialization: None N/A N/A Periwound Skin Texture: Edema: Yes N/A N/A Periwound Skin Moist: Yes N/A N/A Moisture: Periwound Skin Color: Erythema: Yes N/A N/A Erythema Location: Circumferential N/A N/A Temperature: No Abnormality N/A N/A Tenderness on Yes N/A N/A Palpation: Wound Preparation: Ulcer Cleansing: N/A N/A Rinsed/Irrigated with Saline Topical Anesthetic Applied: Other: lidocaine 4% Treatment Notes Electronic Signature(s) Signed: 09/12/2016 4:07:29 PM  By: Alric Quan Entered By: Alric Quan on 09/12/2016 12:53:20 Mazzocco, Damita Dunnings ([id number]) -------------------------------------------------------------------------------- [place] Details Patient Name: [name]. Date of Service: 09/12/2016 12:30 PM Medical Record Number: [id number] Patient Account Number: [id number] Date of Birth/Sex: 1960-12-02 (55 y.o. Female) Treating RN: Carolyne Fiscal, Debi Primary Care Physician: Freddy Finner Other Clinician: Referring Physician: Freddy Finner Treating Physician/Extender: Frann Rider in Treatment: 1 Active Inactive Abuse / Safety / Falls / Self Care Management Nursing Diagnoses: Potential for falls Goals: Patient will remain injury free Date Initiated: 09/05/2016 Goal Status: Active Interventions: Assess fall risk on admission and as needed Assess impairment of mobility on admission and as needed per policy Notes: Nutrition Nursing Diagnoses: Imbalanced nutrition Impaired glucose control: actual or potential Goals: Patient/caregiver agrees to and verbalizes understanding of need to use nutritional supplements and/or vitamins as prescribed Date Initiated: 09/05/2016 Goal Status: Active Patient/caregiver verbalizes understanding of need to maintain therapeutic glucose control per primary care physician Date Initiated: 09/05/2016 Goal Status: Active Patient/caregiver will maintain therapeutic glucose control Date Initiated: 09/05/2016 Goal Status: Active Interventions: Assess patient nutrition upon admission and as needed per policy Grant, Naziah C. ([id number]) Provide education on elevated blood sugars and impact on wound healing Provide education on nutrition Treatment Activities: Education provided on Nutrition : 09/05/2016 Notes: Orientation to the Wound Care Program Nursing Diagnoses: Knowledge deficit related to the wound healing center program Goals: Patient/caregiver will  verbalize understanding of the [place] Program Date Initiated: 09/05/2016 Goal Status: Active Interventions: Provide education on orientation to the wound center Notes: Pain, Acute or Chronic Nursing Diagnoses: Pain, acute or chronic: actual or potential Potential alteration in comfort, pain Goals: Patient will verbalize adequate pain control and receive pain control interventions during procedures as needed Date Initiated: 09/05/2016 Goal Status: Active Patient/caregiver will verbalize adequate pain control between visits Date Initiated: 09/05/2016 Goal Status: Active Patient/caregiver will verbalize comfort level met Date Initiated: 09/05/2016 Goal Status: Active Interventions: Assess comfort goal upon admission Complete pain assessment as per visit requirements Notes: MELENI, DELAHUNT ([id number]) Wound/Skin Impairment Nursing Diagnoses: Impaired tissue integrity Knowledge deficit related to smoking impact on wound healing Knowledge deficit related to ulceration/compromised skin integrity Goals: Ulcer/skin breakdown will have a volume reduction of 30% by week 4 Date Initiated: 09/05/2016 Goal Status: Active Ulcer/skin breakdown will have a volume reduction of 50% by week 8 Date Initiated: 09/05/2016 Goal Status: Active Ulcer/skin breakdown will have a volume reduction of 80% by week 12 Date Initiated: 09/05/2016 Goal Status: Active Interventions: Assess patient/caregiver ability to perform ulcer/skin care regimen upon admission and as needed Assess ulceration(s) every visit Provide education on smoking Notes: Electronic Signature(s) Signed: 09/12/2016 4:07:29 PM By: Alric Quan Entered By: Alric Quan on 09/12/2016 12:52:44 Sievers, Letonia C. ([id number]) -------------------------------------------------------------------------------- Pain  Assessment Details Patient Name: DENORA, WYSOCKI. Date of Service: 09/12/2016 12:30 PM Medical Record  Number: [id number] Patient Account Number: [id number] Date of Birth/Sex: [birth year] (55 y.o. Female) Treating RN: Carolyne Fiscal, Debi Primary Care Physician: Freddy Finner Other Clinician: Referring Physician: Freddy Finner Treating Physician/Extender: Frann Rider in Treatment: 1 Active Problems Location of Pain Severity and Description of Pain Patient Has Paino No Site Locations With Dressing Change: No Pain Management and Medication Current Pain Management: Electronic Signature(s) Signed: 09/12/2016 4:07:29 PM By: Alric Quan Entered By: Alric Quan on 09/12/2016 12:44:06 Shinall, Damita Dunnings ([id number]) -------------------------------------------------------------------------------- Patient/Caregiver Education Details Patient Name: [name]. Date of Service: 09/12/2016 12:30 PM Medical Record Number: [id number] Patient Account Number: [id number] Date of Birth/Gender: 02/18/1961 (55 y.o. Female) Treating RN: Carolyne Fiscal, Debi Primary Care Physician: Freddy Finner Other Clinician: Referring Physician: Freddy Finner Treating Physician/Extender: Frann Rider in Treatment: 1 Education Assessment Education Provided To: Patient Education Topics Provided Wound/Skin Impairment: Handouts: Other: change dressing as ordered Methods: Demonstration, Explain/Verbal Responses: State content correctly Electronic Signature(s) Signed: 09/12/2016 4:07:29 PM By: Alric Quan Entered By: Alric Quan on 09/12/2016 13:12:48 Beigel, Mashayla Loletha Grayer ([id number]) -------------------------------------------------------------------------------- Wound Assessment Details Patient Name: Cynthia Doe, Aleyda C. Date of Service: 09/12/2016 12:30 PM Medical Record Number: [id number] Patient Account Number: [id number] Date of Birth/Sex: 08/06/1961 (55 y.o. Female) Treating RN: Carolyne Fiscal, Debi Primary Care Physician: Freddy Finner Other Clinician: Referring Physician: Freddy Finner Treating Physician/Extender: Frann Rider in Treatment: 1 Wound Status Wound Number: 1 Primary Diabetic Wound/Ulcer of the Lower Etiology: Extremity Wound Location: Right Lower Leg - Posterior Wound Open Wounding Event: Gradually Appeared Status: Date Acquired: 01/05/2016 Comorbid Anemia, Chronic Obstructive Pulmonary Weeks Of Treatment: 1 History: Disease (COPD), Sleep Apnea, Clustered Wound: No Congestive Heart Failure, Coronary Artery Disease, Hypertension, Peripheral Venous Disease, Type II Diabetes, Osteoarthritis, Neuropathy Photos Photo Uploaded By: Alric Quan on 09/12/2016 12:57:26 Wound Measurements Length: (cm) 0.8 Width: (cm) 1.4 Depth: (cm) 0.2 Area: (cm) 0.88 Volume: (cm) 0.176 % Reduction in Area: 17% % Reduction in Volume: -66% Epithelialization: None Tunneling: No Undermining: No Wound Description Classification: Grade 1 Foul Odor Aft Wound Margin: Distinct, outline attached Exudate Amount: Large Exudate Type: Serous Exudate Color: amber er Cleansing: No Wound Bed Granulation Amount: None Present (0%) Exposed Structure Tutton, Elinore C. ([id number]) Necrotic Amount: Large (67-100%) Fascia Exposed: No Necrotic Quality: Adherent Slough Fat Layer Exposed: No Tendon Exposed: No Muscle Exposed: No Joint Exposed: No Bone Exposed: No Limited to Skin Breakdown Periwound Skin Texture Texture Color No Abnormalities Noted: No No Abnormalities Noted: No Localized Edema: Yes Erythema: Yes Erythema Location: Circumferential Moisture No Abnormalities Noted: No Temperature / Pain Moist: Yes Temperature: No Abnormality Tenderness on Palpation: Yes Wound Preparation Ulcer Cleansing: Rinsed/Irrigated with Saline Topical Anesthetic Applied: Other: lidocaine 4%, Treatment Notes Wound #1 (Right, Posterior Lower Leg) 1. Cleansed with: Clean wound with Normal Saline 2. Anesthetic Topical Lidocaine 4% cream to wound bed prior to  debridement 4. Dressing Applied: Santyl Ointment 5. Secondary Dressing Applied ABD Pad Dry Gauze Kerlix/Conform 7. Secured with Tape Notes netting Electronic Signature(s) Signed: 09/12/2016 4:07:29 PM By: Alric Quan Entered By: Alric Quan on 09/12/2016 12:50:38 Kohan, Damita Dunnings ([id number]) -------------------------------------------------------------------------------- [place] Details Patient Name: Cynthia Aquas C. Date of Service: 09/12/2016 12:30 PM Medical Record Number: [id number] Patient Account Number: [id number] Date of Birth/Sex: 03-13-1961 (55 y.o. Female) Treating RN: Carolyne Fiscal, Debi Primary Care Physician: Freddy Finner Other Clinician: Referring Physician: Freddy Finner Treating Physician/Extender: Frann Rider in Treatment: 1 Vital Signs Time Taken: 12:44  Temperature (F): 98.3 Height (in): 62 Pulse (bpm): 86 Weight (lbs): 276 Respiratory Rate (breaths/min): 18 Body Mass Index (BMI): 50.5 Blood Pressure (mmHg): 113/72 Reference Range: 80 - 120 mg / dl Electronic Signature(s) Signed: 09/12/2016 4:07:29 PM By: Alric Quan Entered By: Alric Quan on 09/12/2016 12:44:47

## 2016-09-12 NOTE — Progress Notes (Addendum)
[name] ([id number]) Visit Report for 09/12/2016 Chief Complaint Document Details Patient Name: [name] 09/12/2016 12:30 Date of Service: PM Medical Record [id number] Number: Patient Account Number: [id number] 09-15-1961 (55 y.o. 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 09/12/2016 12:30 Date of Service: PM Medical Record [id number] Number: Patient Account Number: [id number] 1961/06/28 (55 y.o. Treating RN: Phillis Haggis Date of Birth/Sex: Female) Other Clinician: Primary Care Physician: RUBIO, JESSICA Treating Lynnet Hefley Referring Physician: Sandrea Hughs Physician/Extender: Tania Ade in Treatment: 1 Debridement Performed for Wound #1 Right,Posterior Lower Leg Assessment: Performed By: Physician Evlyn Kanner, MD Debridement: Debridement Pre-procedure Yes - 12:58 Verification/Time Out Taken: Start Time: 12:59 Pain Control: Lidocaine 4% Topical Solution Level: Skin/Subcutaneous Tissue Total Area Debrided (L x 0.8 (cm) x 1.4 (cm) = 1.12 (cm) W): Tissue and other Viable, Non-Viable, Exudate, Fibrin/Slough, Subcutaneous material debrided: Instrument: Curette Bleeding: Minimum Hemostasis Achieved: Pressure End Time: 13:02 Procedural Pain: 0 Post Procedural Pain: 3 Response to  Treatment: Procedure was tolerated well Post Debridement Measurements of Total Wound Length: (cm) 0.8 Width: (cm) 1.4 Depth: (cm) 0.2 Volume: (cm) 0.176 Character of Wound/Ulcer Post Requires Further Debridement Debridement: Severity of Tissue Post Debridement: Fat layer exposed Post Procedure Diagnosis Same as Pre-procedure Electronic Signature(s) Signed: 09/12/2016 1:05:51 PM By: Evlyn Kanner MD, FACS Signed: 09/12/2016 4:07:29 PM By: Ricky Stabs ([id number]) Previous Signature: 09/12/2016 1:05:42 PM Version By: Evlyn Kanner MD, FACS Entered By: Evlyn Kanner on 09/12/2016 13:05:51 [name] ([id number]) -------------------------------------------------------------------------------- HPI Details Patient Name: [name]. 09/12/2016 12:30 Date of Service: PM Medical Record [id number] Number: Patient Account Number: [id number] 24-Jan-1961 (55 y.o. Treating RN: Phillis Haggis Date of Birth/Sex: Female) Other Clinician: Primary Care Physician: RUBIO, JESSICA Treating Amariana Mirando Referring Physician: Sandrea Hughs Physician/Extender: Weeks in Treatment: 1 History of Present Illness Location: right posterior calf Quality: Patient reports experiencing a sharp pain to affected area(s). Severity: Patient states wound are getting worse. Duration: Patient has had the wound for > 8 months prior to seeking treatment at the wound center Timing: Pain in wound is constant (hurts all the time) Context: The wound appeared gradually over time Modifying Factors: Other treatment(s) tried include:several courses of antibiotics have been tried including at the ER recently Associated Signs and Symptoms: Patient reports having increase swelling. HPI Description: 55 year old patient was seen about a month ago in the ER with a chronic wound to her right posterior calf which she had for 3 months. after workup a CT was also done and this showed cellulitis without  a discrete drainable abscess and no evidence of mild fasciitis or pyomyositis. There Was also no septic arthritis or osteomyelitis. She was given injectable clindamycin and discharged home on oral clindamycin at that stage. Past medical history of diabetes mellitus, anemia, CHF, COPD, peripheral vascular disease and sleep apnea. She is known to have a carotid artery obstruction. she is status post coronary angioplasty with stents, carotid endarterectomy in 2016 on the left side by Dr. dew, right lower extremity angiography by  Dr. dew in August 2016 with intervention. On review of the notes from her 05/27/2015 procedure by Dr. dew showed that she had peripheral artery disease with claudication bilateral lower extremity right greater than left. At that stage he did a right lower extremity angiogram with percutaneous angioplasty of the proximal and mid SFA, distal SFA and above- knee popliteal artery and placement of a stent in the mid to distal SFA and proximal popliteal artery. She smokes and has been doing this for several years. Electronic Signature(s) Signed: 09/12/2016 1:06:01 PM By: Evlyn Kanner MD, FACS Entered By: Evlyn Kanner on 09/12/2016 13:06:00 [name] ([id number]) -------------------------------------------------------------------------------- Physical Exam Details Patient Name: [name] 09/12/2016 12:30 Date of Service: PM Medical Record [id number] Number: Patient Account Number: [id number] 1961-06-21 (55 y.o. Treating RN: Phillis Haggis Date of Birth/Sex: Female) Other Clinician: Primary Care Physician: RUBIO, JESSICA Treating Micco Bourbeau Referring Physician: Sandrea Hughs Physician/Extender: Weeks in Treatment: 1 Constitutional . Pulse regular. Respirations normal and unlabored. Afebrile. . Eyes Nonicteric. Reactive to light. Ears, Nose, Mouth, and Throat Lips, teeth, and gums WNL.Marland Kitchen Moist mucosa without lesions. Neck supple and nontender. No  palpable supraclavicular or cervical adenopathy. Normal sized without goiter. Respiratory WNL. No retractions.. Cardiovascular Pedal Pulses WNL. No clubbing, cyanosis or edema. Lymphatic No adneopathy. No adenopathy. No adenopathy. Musculoskeletal Adexa without tenderness or enlargement.. Digits and nails w/o clubbing, cyanosis, infection, petechiae, ischemia, or inflammatory conditions.. Integumentary (Hair, Skin) No suspicious lesions. No crepitus or fluctuance. No peri-wound warmth or erythema. No masses.Marland Kitchen Psychiatric Judgement and insight Intact.. No evidence of depression, anxiety, or agitation.. Notes the wound continues to have some subcutaneous debris and using a #3 curet I have sharply remove some of it and bleeding controlled with pressure Electronic Signature(s) Signed: 09/12/2016 1:07:58 PM By: Evlyn Kanner MD, FACS Entered By: Evlyn Kanner on 09/12/2016 13:07:58 [name] ([id number]) -------------------------------------------------------------------------------- Physician Orders Details Patient Name: [name]. 09/12/2016 12:30 Date of Service: PM Medical Record [id number] Number: Patient Account Number: [id number] [birth year] (55 y.o. Treating RN: Phillis Haggis Date of Birth/Sex: Female) Other Clinician: Primary Care Physician: RUBIO, JESSICA Treating Labib Cwynar Referring Physician: Sandrea Hughs Physician/Extender: Tania Ade in Treatment: 1 Verbal / Phone Orders: Yes Clinician: Pinkerton, Debi Read Back and Verified: Yes Diagnosis Coding Wound Cleansing Wound #1 Right,Posterior Lower Leg o Clean wound with Normal Saline. o Cleanse wound with mild soap and water Anesthetic Wound #1 Right,Posterior Lower Leg o Topical Lidocaine 4% cream applied to wound bed prior to debridement - for clinic use Skin Barriers/Peri-Wound Care Wound #1 Right,Posterior Lower Leg o Skin Prep Primary Wound Dressing Wound #1 Right,Posterior Lower Leg o  Santyl Ointment o Medihoney gel - if pt is unable to get Santyl may use medihoney or manukahoney Secondary Dressing Wound #1 Right,Posterior Lower Leg o ABD pad o Dry Gauze o Kerlix and Coban o Other - stretch netting #5 Follow-up Appointments Wound #1 Right,Posterior Lower Leg o Return Appointment in 1 week. Edema Control Wound #1 Right,Posterior Lower Leg o Elevate legs to the level of the heart and pump ankles as often as possible Morford, Mahrosh C. ([id number]) Additional Orders / Instructions Wound #1 Right,Posterior Lower Leg o Stop Smoking o Increase protein intake. Medications-please add to medication list. Wound #1 Right,Posterior Lower Leg o Santyl Enzymatic Ointment o Other: - Vitamin C, Zinc, Multivitamin Electronic Signature(s) Signed: 09/12/2016 4:07:29 PM By: Alejandro Mulling Signed: 09/12/2016 4:08:26 PM By: Evlyn Kanner MD, FACS Entered By: Alejandro Mulling on 09/12/2016 13:12:04 Cayabyab, Keasia C. ([id number]) --------------------------------------------------------------------------------  Problem List Details Patient Name: [name], Cynthia C. 09/12/2016 12:30 Date of Service: PM Medical Record [id number] Number: Patient Account Number: [id number] 02/07/1961 (55 y.o. 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 ([id number]) -------------------------------------------------------------------------------- Progress Note Details Patient Name: [name], Cynthia C. 09/12/2016 12:30 Date of Service: PM Medical Record [id number] Number: Patient Account Number: [id number] 06/08/1961 (55 y.o. 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 Severity: Patient states wound are getting worse. Duration: Patient has had the wound for > 8 months prior to seeking treatment at the wound center Timing: Pain in wound is constant (hurts all the time) Context: The wound appeared gradually over time Modifying Factors: Other treatment(s) tried include:several courses of antibiotics have been tried including at the ER recently Associated Signs and Symptoms: Patient reports having increase swelling. 55 year old patient was seen about a month ago in the ER with a chronic wound to her right posterior calf which she had for 3 months. after workup a CT was also done and this showed cellulitis without a discrete drainable abscess and no evidence of mild fasciitis or pyomyositis. There Was also no septic arthritis  or osteomyelitis. She was given injectable clindamycin and discharged home on oral clindamycin at that stage. Past medical history of diabetes mellitus, anemia, CHF, COPD, peripheral vascular disease and sleep apnea. She is known to have a carotid artery obstruction. she is status post coronary angioplasty with stents, carotid endarterectomy in 2016 on the left side by Dr. dew, right lower extremity angiography by Dr. dew in August 2016 with intervention. On review of the notes from her 05/27/2015 procedure by Dr. dew showed that she had peripheral artery disease with claudication bilateral lower extremity right greater than left. At that stage he did a right lower extremity angiogram with percutaneous angioplasty of the proximal and mid SFA, distal SFA and above- knee popliteal artery and placement of a stent in the mid to distal SFA and proximal popliteal artery. She smokes and has been doing this for several years. Beals, Yolandra C. ([id number]) Objective Constitutional Pulse regular. Respirations normal and unlabored. Afebrile. Vitals Time Taken: 12:44 PM, Height: 62 in, Weight: 276 lbs, BMI: 50.5, Temperature: 98.3 F, Pulse: 86 bpm, Respiratory Rate: 18 breaths/min, Blood Pressure: 113/72 mmHg. Eyes Nonicteric. Reactive to light. Ears, Nose, Mouth, and Throat Lips, teeth, and gums WNL.Marland Kitchen. Moist mucosa without lesions. Neck  supple and nontender. No palpable supraclavicular or cervical adenopathy. Normal sized without goiter. Respiratory WNL. No retractions.. Cardiovascular Pedal Pulses WNL. No clubbing, cyanosis or edema. Lymphatic No adneopathy. No adenopathy. No adenopathy. Musculoskeletal Adexa without tenderness or enlargement.. Digits and nails w/o clubbing, cyanosis, infection, petechiae, ischemia, or inflammatory conditions.Marland Kitchen Psychiatric Judgement and insight Intact.. No evidence of depression, anxiety, or agitation.. General Notes: the wound continues to have some  subcutaneous debris and using a #3 curet I have sharply remove some of it and bleeding controlled with pressure Integumentary (Hair, Skin) No suspicious lesions. No crepitus or fluctuance. No peri-wound warmth or erythema. No masses.. Wound #1 status is Open. Original cause of wound was Gradually Appeared. The wound is located on the Right,Posterior Lower Leg. The wound measures 0.8cm length x 1.4cm width x 0.2cm depth; 0.88cm^2 area and 0.176cm^3 volume. The wound is limited to skin breakdown. There is no tunneling or undermining noted. There is a large amount of serous drainage noted. The wound margin is distinct with the outline attached to the wound base. There is no granulation within the wound bed. There is a large (67-100%) amount of necrotic tissue within the wound bed including Adherent Slough. The periwound skin appearance [name], Cynthia C. ([id number]) exhibited: Localized Edema, Moist, Erythema. The surrounding wound skin color is noted with erythema which is circumferential. Periwound temperature was noted as No Abnormality. The periwound has tenderness on palpation. 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 There was a Skin/Subcutaneous Tissue Debridement ([id number]) debridement with total area of 1.12 sq cm performed by Evlyn Kanner, MD. with the following instrument(s): Curette to remove Viable and Non- Viable tissue/material including Exudate, Fibrin/Slough, and Subcutaneous after achieving pain control using  Lidocaine 4% Topical Solution. A time out was conducted at 12:58, prior to the start of the procedure. A Minimum amount of bleeding was controlled with Pressure. The procedure was tolerated well with a pain level of 0 throughout and a pain level of 3 following the procedure. Post Debridement Measurements: 0.8cm length x 1.4cm width x 0.2cm depth; 0.176cm^3 volume. Character of Wound/Ulcer Post Debridement requires further debridement. Severity of Tissue Post Debridement is: Fat layer exposed. Post procedure Diagnosis Wound #1: Same as Pre-Procedure Plan Wound Cleansing: Wound #1 Right,Posterior Lower Leg: Clean wound with Normal Saline. Cleanse wound with mild soap and water Prestwood, Cynthia C. ([id number]) Anesthetic: Wound #1 Right,Posterior Lower Leg: Topical Lidocaine 4% cream applied to wound bed prior to debridement - for clinic use Skin Barriers/Peri-Wound Care: Wound #1 Right,Posterior Lower Leg: Skin Prep Primary Wound Dressing: Wound #1 Right,Posterior Lower Leg: Santyl Ointment Medihoney gel - if pt is unable to get Santyl may use medihoney or manukahoney Secondary Dressing: Wound #1 Right,Posterior Lower Leg: ABD pad Dry Gauze Kerlix and Coban Other - stretch netting #5 Follow-up Appointments: Wound #1 Right,Posterior Lower Leg: Return Appointment in 1 week. Edema Control: Wound #1 Right,Posterior Lower Leg: Elevate legs to the level of the heart and pump ankles as often as possible Additional Orders / Instructions: Wound #1 Right,Posterior Lower Leg: Stop Smoking Increase protein intake. Medications-please add to medication  list.: Wound #1 Right,Posterior Lower Leg: Santyl Enzymatic Ointment Other: - Vitamin C, Zinc, Multivitamin After review have recommended: 1. Santyl ointment to be applied to the wound with dressing changes daily. 2. review by her vascular surgeons as soon as appointment is available -- pending 3. Good control of her diabetes mellitus with a  recent A1c to be done 4. her the need to completely give up smoking and I discussed the risks benefits and alternatives and methodology -- she has given up smoking 5. regular visits the wound center Electronic Signature(s) Signed: 09/12/2016 4:10:55 PM By: Evlyn KannerBritto, Gracey Tolle MD, FACS Previous Signature: 09/12/2016 1:09:08 PM Version By: Evlyn KannerBritto, Shriley Joffe MD, FACS Entered By: Evlyn KannerBritto, Edmon Magid on 09/12/2016 16:10:55 [name], Cynthia C. ([id number]) Sturges, Barrie FolkVERNA C. ([id number]) -------------------------------------------------------------------------------- SuperBill Details Patient Name: [name], Emree C. Date of Service: 09/12/2016 Medical Record Number: [id number] Patient Account Number: [id number] Date of Birth/Sex: 03/12/1961 (55 y.o. 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 F17.218 Nicotine dependence, cigarettes, with other nicotine-induced disorders E66.01 Morbid (severe) obesity due to excess calories Facility Procedures CPT4 Code Description: [id number] 11042 - DEB SUBQ TISSUE 20 SQ CM/< ICD-10 Description Diagnosis E11.622 Type 2 diabetes mellitus with other skin ulcer L97.212 Non-pressure chronic ulcer of right calf with fat lay I70.232 Atherosclerosis of native  arteries of right leg with E66.01 Morbid (severe) obesity due to excess calories Modifier: er exposed ulceration of c Quantity: 1 alf Physician Procedures CPT4 Code Description: [id number] 11042 - WC PHYS SUBQ TISS 20 SQ CM ICD-10 Description Diagnosis E11.622 Type 2 diabetes mellitus with other skin ulcer L97.212 Non-pressure chronic ulcer  of right calf with fat lay I70.232 Atherosclerosis of native arteries  of right leg with E66.01 Morbid (severe) obesity due to excess calories Modifier: er exposed ulceration of c Quantity: 1 alf Electronic Signature(s) Signed: 09/12/2016 4:11:02 PM By: Evlyn KannerBritto, Nyazia Canevari MD, FACS Previous Signature: 09/12/2016 1:09:18 PM Version By: Evlyn KannerBritto, Kasper Mudrick MD, FACS Entered By: Evlyn KannerBritto, Reed Dady on 09/12/2016 16:11:02

## 2016-09-13 ENCOUNTER — Encounter (INDEPENDENT_AMBULATORY_CARE_PROVIDER_SITE_OTHER): Payer: Self-pay | Admitting: Vascular Surgery

## 2016-09-13 ENCOUNTER — Other Ambulatory Visit (INDEPENDENT_AMBULATORY_CARE_PROVIDER_SITE_OTHER): Payer: Self-pay | Admitting: Vascular Surgery

## 2016-09-13 ENCOUNTER — Ambulatory Visit (INDEPENDENT_AMBULATORY_CARE_PROVIDER_SITE_OTHER): Payer: Medicaid Other | Admitting: Vascular Surgery

## 2016-09-13 ENCOUNTER — Ambulatory Visit (INDEPENDENT_AMBULATORY_CARE_PROVIDER_SITE_OTHER): Payer: Medicaid Other

## 2016-09-13 ENCOUNTER — Encounter (INDEPENDENT_AMBULATORY_CARE_PROVIDER_SITE_OTHER): Payer: Self-pay

## 2016-09-13 VITALS — BP 114/78 | HR 82 | Resp 16 | Wt 267.0 lb

## 2016-09-13 DIAGNOSIS — I7025 Atherosclerosis of native arteries of other extremities with ulceration: Secondary | ICD-10-CM | POA: Diagnosis not present

## 2016-09-13 DIAGNOSIS — I739 Peripheral vascular disease, unspecified: Secondary | ICD-10-CM | POA: Diagnosis not present

## 2016-09-13 DIAGNOSIS — F17200 Nicotine dependence, unspecified, uncomplicated: Secondary | ICD-10-CM | POA: Insufficient documentation

## 2016-09-13 DIAGNOSIS — E118 Type 2 diabetes mellitus with unspecified complications: Secondary | ICD-10-CM | POA: Diagnosis not present

## 2016-09-13 DIAGNOSIS — I10 Essential (primary) hypertension: Secondary | ICD-10-CM | POA: Insufficient documentation

## 2016-09-13 DIAGNOSIS — E119 Type 2 diabetes mellitus without complications: Secondary | ICD-10-CM | POA: Insufficient documentation

## 2016-09-13 NOTE — Assessment & Plan Note (Signed)
The patient has a nonhealing Ulceration on the right lower extremity with a known history of peripheral arterial disease. She is massively obese, has diabetes, hypertension, and a litany of vascular risk factors. At this point, I think proceeding with an angiogram would be the most prudent option in her care. She reports the wound center seeing her and she is getting excellent local wound care but the wound persists. I discussed the risks and benefits of angiography with the patient. She is agreeable to proceed with this.

## 2016-09-13 NOTE — Progress Notes (Signed)
MRN : [id number]  [name] is a 55 y.o. ([birth year]) female who presents with chief complaint of  Chief Complaint  Patient presents with  . Re-evaluation    Non healing wound, right leg  .  History of Present Illness: Patient returns today On request from the wound care center for evaluation of a nonhealing ulceration in the right leg. This is now been present for about 8 months. She has a long vascular history and has previously undergone carotid endarterectomy for high-grade stenosis. She is also undergone lower extremity revascularization in the past. There is no clear inciting event or causative factor that started the wound. It has not healed despite optimal wound care. She denies fever or chills or signs of systemic infection. She has a litany of vascular risk factors as described below history of previous vascular issues as described as well.  Current Outpatient Prescriptions  Medication Sig Dispense Refill  . aspirin EC 81 MG tablet Take 81 mg by mouth daily.    Marland Kitchen buPROPion (WELLBUTRIN SR) 150 MG 12 hr tablet TAKE 1 TABLET BY MOUTH 3 TIMES A DAY FOR 3 DAYS THEN 1 TABLET TWICE A DAY AFTER  6  . BYDUREON 2 MG PEN INJECT SUBCUTANEOUSLY ONCE WEEKLY FOR HIGH BLOOD SUGAR  6  . carvedilol (COREG) 12.5 MG tablet Take 12.5 mg by mouth 2 (two) times daily with a meal.    . cetirizine (ZYRTEC) 10 MG tablet Take 10 mg by mouth daily.    . cholecalciferol (VITAMIN D) 1000 UNITS tablet Take 1,000 Units by mouth daily.    . clindamycin (CLEOCIN) 300 MG capsule Take 1 capsule (300 mg total) by mouth 4 (four) times daily. 28 capsule 0  . clopidogrel (PLAVIX) 75 MG tablet Take 75 mg by mouth daily.    . cyclobenzaprine (FLEXERIL) 10 MG tablet Take 10 mg by mouth at bedtime.    . docusate sodium (COLACE) 100 MG capsule Take 100 mg by mouth 2 (two) times daily.    . enalapril (VASOTEC) 10 MG tablet 1 BY MOUTH DAILY FOR HIGH BLOOD PRESSURE    . enalapril (VASOTEC) 20 MG tablet Take 20 mg by mouth  daily.    . fexofenadine (ALLEGRA) 180 MG tablet Take by mouth.    . furosemide (LASIX) 40 MG tablet Take 40 mg by mouth daily.    Marland Kitchen gabapentin (NEURONTIN) 300 MG capsule Take 300 mg by mouth daily as needed (for nerve pain).     Marland Kitchen glipiZIDE (GLUCOTROL) 10 MG tablet Take 10 mg by mouth 2 (two) times daily before a meal.    . insulin glargine (LANTUS) 100 UNIT/ML injection Inject 50 Units into the skin daily.    . metFORMIN (GLUCOPHAGE) 1000 MG tablet Take 1,000 mg by mouth 2 (two) times daily with a meal.    . pantoprazole (PROTONIX) 40 MG tablet Take 40 mg by mouth daily.    . sertraline (ZOLOFT) 100 MG tablet Take 150 mg by mouth daily.     . simvastatin (ZOCOR) 40 MG tablet Take 40 mg by mouth daily.     . sitaGLIPtin (JANUVIA) 100 MG tablet Take 100 mg by mouth daily.    . traMADol (ULTRAM) 50 MG tablet Take 1 tablet (50 mg total) by mouth every 6 (six) hours as needed. 10 tablet 0  . traZODone (DESYREL) 50 MG tablet Take 50 mg by mouth at bedtime.     No current facility-administered medications for this visit.  Past Medical History:  Diagnosis Date  . Anemia   . Anxiety   . Arthritis   . CHF (congestive heart failure) ([place])   . COPD (chronic obstructive pulmonary disease) ([place])   . Coronary artery disease   . Diabetes mellitus without complication ([place])   . Fibromyalgia   . GERD (gastroesophageal reflux disease)   . Hypertension   . Peripheral vascular disease ([place])   . Sleep apnea   . Stroke [facility])     Past Surgical History:  Procedure Laterality Date  . CESAREAN SECTION    . CORONARY ANGIOPLASTY WITH STENT PLACEMENT Left   . DILATION AND CURETTAGE OF UTERUS    . ENDARTERECTOMY Left 01/29/2015   Procedure: ENDARTERECTOMY CAROTID;  Surgeon: Algernon Huxley, MD;  Location: [facility] ORS;  Service: Vascular;  Laterality: Left;  . PERIPHERAL VASCULAR CATHETERIZATION Right 05/27/2015   Procedure: Lower Extremity Angiography;  Surgeon: Algernon Huxley, MD;  Location: [place] [facility];  Service: Cardiovascular;  Laterality: Right;  . PERIPHERAL VASCULAR CATHETERIZATION  05/27/2015   Procedure: Lower Extremity Intervention;  Surgeon: Algernon Huxley, MD;  Location: [place] [facility];  Service: Cardiovascular;;  . TUBAL LIGATION      Social History Social History  Substance Use Topics  . Smoking status: Current Some Day Smoker    Packs/day: 0.25    Years: 38.00    Types: Cigarettes  . Smokeless tobacco: Current User  . Alcohol use No  No IV drug use  Family History No bleeding disorders, clotting disorders, autoimmune diseases, or aneurysms  Allergies  Allergen Reactions  . Niacin And Related Nausea And Vomiting     REVIEW OF SYSTEMS (Negative unless checked)  Constitutional: [] Weight loss  [] Fever  [] Chills Cardiac: [] Chest pain   [] Chest pressure   [] Palpitations   [] Shortness of breath when laying flat   [] Shortness of breath at rest   [x] Shortness of breath with exertion. Vascular:  [] Pain in legs with walking   [] Pain in legs at rest   [] Pain in legs when laying flat   [] Claudication   [] Pain in feet when walking  [] Pain in feet at rest  [] Pain in feet when laying flat   [] History of DVT   [] Phlebitis   [x] Swelling in legs   [] Varicose veins   [x] Non-healing ulcers Pulmonary:   [] Uses home oxygen   [] Productive cough   [] Hemoptysis   [] Wheeze  [x] COPD   [] Asthma Neurologic:  [] Dizziness  [] Blackouts   [] Seizures   [] History of stroke   [] History of TIA  [] Aphasia   [] Temporary blindness   [] Dysphagia   [] Weakness or numbness in arms   [] Weakness or numbness in legs Musculoskeletal:  [x] Arthritis   [] Joint swelling   [x] Joint pain   [] Low back pain Hematologic:  [] Easy bruising  [] Easy bleeding   [] Hypercoagulable state   [] Anemic   Gastrointestinal:  [] Blood in stool   [] Vomiting blood  [] Gastroesophageal reflux/heartburn   [] Abdominal pain Genitourinary:  [] Chronic kidney disease   [] Difficult urination  [] Frequent urination  [] Burning with urination    [] Hematuria Skin:  [] Rashes   [x] Ulcers   [x] Wounds Psychological:  [] History of anxiety   []  History of major depression.  Physical Examination  BP 109/72 (BP Location: Right Arm)   Pulse 84   Resp 17   Ht 5' 2"  (1.575 m)   Wt 268 lb (121.6 kg)   BMI 49.02 kg/m  Gen:  WD/WN, massive obese WF. Head: [place]/AT, No temporalis wasting. Ear/Nose/Throat: Hearing  grossly intact, nares w/o erythema or drainage, trachea midline Eyes: Conjunctiva clear. Sclera non-icteric Neck: Supple.  No JVD.  Pulmonary:  Good air movement, no use of accessory muscles.  Cardiac: RRR, normal S1, S2 Vascular:  Vessel Right Left  Radial Palpable Palpable  Ulnar Palpable Palpable  Brachial Palpable Palpable  Carotid Palpable, without bruit Palpable, without bruit  Aorta Not palpable N/A  Femoral Palpable Palpable  Popliteal Not Palpable 1+ Palpable  PT Not Palpable Not Palpable  DP Trace Palpable 1+ Palpable   Gastrointestinal: soft, non-tender/non-distended. No guarding/reflex.  Musculoskeletal: M/S 5/5 throughout.  No deformity or atrophy. Pale ulceration on the right posterior calf with poor granulation tissue and some fibrinous exudate. Sensation grossly intact in extremities.  Symmetrical.  Speech is fluent.  Psychiatric: Judgment intact, Mood & affect appropriate for pt's clinical situation. Dermatologic: Right calf ulceration as described above Lymphatic : No Cervical, Axillary, or Inguinal lymphadenopathy.     Labs Recent Results (from the past 2160 hour(s))  Comprehensive metabolic panel     Status: Abnormal   Collection Time: 08/02/16  4:21 PM  Result Value Ref Range   Sodium 140 135 - 145 mmol/L   Potassium 4.0 3.5 - 5.1 mmol/L   Chloride 102 101 - 111 mmol/L   CO2 29 22 - 32 mmol/L   Glucose, Bld 219 (H) 65 - 99 mg/dL   BUN 18 6 - 20 mg/dL   Creatinine, Ser 1.01 (H) 0.44 - 1.00 mg/dL   Calcium 9.4 8.9 - 10.3 mg/dL   Total Protein 7.5 6.5 - 8.1 g/dL   Albumin 3.7 3.5 - 5.0 g/dL    AST 25 15 - 41 U/L   ALT 25 14 - 54 U/L   Alkaline Phosphatase 93 38 - 126 U/L   Total Bilirubin <0.1 (L) 0.3 - 1.2 mg/dL   GFR calc non Af Amer >60 >60 mL/min   GFR calc Af Amer >60 >60 mL/min    Comment: (NOTE) The eGFR has been calculated using the CKD EPI equation. This calculation has not been validated in all clinical situations. eGFR's persistently <60 mL/min signify possible Chronic Kidney Disease.    Anion gap 9 5 - 15  CBC with Differential     Status: Abnormal   Collection Time: 08/02/16  4:21 PM  Result Value Ref Range   WBC 9.8 3.6 - 11.0 K/uL   RBC 4.68 3.80 - 5.20 MIL/uL   Hemoglobin 11.1 (L) 12.0 - 16.0 g/dL   HCT 35.9 35.0 - 47.0 %   MCV 76.6 (L) 80.0 - 100.0 fL   MCH 23.8 (L) 26.0 - 34.0 pg   MCHC 31.0 (L) 32.0 - 36.0 g/dL   RDW 17.2 (H) 11.5 - 14.5 %   Platelets 167 150 - 440 K/uL   Neutrophils Relative % 80 %   Neutro Abs 7.8 (H) 1.4 - 6.5 K/uL   Lymphocytes Relative 11 %   Lymphs Abs 1.1 1.0 - 3.6 K/uL   Monocytes Relative 6 %   Monocytes Absolute 0.6 0.2 - 0.9 K/uL   Eosinophils Relative 3 %   Eosinophils Absolute 0.3 0 - 0.7 K/uL   Basophils Relative 0 %   Basophils Absolute 0.0 0 - 0.1 K/uL  Culture, blood (routine x 2)     Status: None   Collection Time: 08/02/16  4:22 PM  Result Value Ref Range   Specimen Description BLOOD RIGHT HAND    Special Requests BOTTLES DRAWN AEROBIC AND ANAEROBIC [place]    Culture  NO GROWTH 5 DAYS    Report Status 08/07/2016 FINAL   Culture, blood (routine x 2)     Status: None   Collection Time: 08/02/16  4:22 PM  Result Value Ref Range   Specimen Description BLOOD LH    Special Requests      BOTTLES DRAWN AEROBIC AND ANAEROBIC AER 10CC ANA 5CC   Culture NO GROWTH 5 DAYS    Report Status 08/07/2016 FINAL     Radiology No results found.   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 Checked earlier this year and doing well. To be checked again next year.  Diabetes  ([place]) blood glucose control important in reducing the progression of atherosclerotic disease. Also, involved in wound healing. On appropriate medications.   Essential hypertension, benign blood pressure control important in reducing the progression of atherosclerotic disease. On appropriate oral medications.   Atherosclerosis of native arteries of the extremities with ulceration ([place]) The patient has a nonhealing Ulceration on the right lower extremity with a known history of peripheral arterial disease. She is massively obese, has diabetes, hypertension, and a litany of vascular risk factors. At this point, I think proceeding with an angiogram would be the most prudent option in her care. She reports the wound center seeing her and she is getting excellent local wound care but the wound persists. I discussed the risks and benefits of angiography with the patient. She is agreeable to proceed with this.  Tobacco use disorder We had a discussion for approximately 3-4 minutes regarding the absolute need for smoking cessation due to the deleterious nature of tobacco on the vascular system. We discussed the tobacco use would diminish patency of any intervention, and likely significantly worsen progressio of disease. We discussed multiple agents for quitting including replacement therapy or medications to reduce cravings such as Chantix. The patient voices their understanding of the importance of smoking cessation.     Leotis Pain, MD  09/13/2016 1:26 PM    This note was created with Dragon medical transcription system.  Any errors from dictation are purely unintentional

## 2016-09-13 NOTE — Assessment & Plan Note (Signed)
blood glucose control important in reducing the progression of atherosclerotic disease. Also, involved in wound healing. On appropriate medications.  

## 2016-09-13 NOTE — Assessment & Plan Note (Signed)
We had a discussion for approximately 3-4 minutes regarding the absolute need for smoking cessation due to the deleterious nature of tobacco on the vascular system. We discussed the tobacco use would diminish patency of any intervention, and likely significantly worsen progressio of disease. We discussed multiple agents for quitting including replacement therapy or medications to reduce cravings such as Chantix. The patient voices their understanding of the importance of smoking cessation.  

## 2016-09-13 NOTE — Progress Notes (Signed)
Subjective:    Patient ID: [name], female    DOB: [birth year], 55 y.o.   MRN: [id number] Chief Complaint  Patient presents with  . Follow-up   HPI Patient present to review vascular studies. She was last seen on 09/09/16 referred by the wound care center for evaluation of a nonhealing ulceration in the right leg. This is now been present for about 8 months. She has a long vascular history and has previously undergone carotid endarterectomy for high-grade stenosis. She is also undergone lower extremity revascularization in the past. There is no clear inciting event or causative factor that started the wound. It has not healed despite optimal wound care. She denies fever or chills or signs of systemic infection. She has a litany of vascular risk factors as described below history of previous vascular issues as described as well. The patient underwent an ABI which showed Right ABI: 1.02 and Left 1.05 (on 10/20/15, Right ABI: 1.09 and Left 1.13).  Review of Systems Constitutional: [] Weight loss  [] Fever  [] Chills Cardiac: [] Chest pain   [] Chest pressure   [] Palpitations   [] Shortness of breath when laying flat   [] Shortness of breath at rest   [x] Shortness of breath with exertion. Vascular:  [] Pain in legs with walking   [] Pain in legs at rest   [] Pain in legs when laying flat   [] Claudication   [] Pain in feet when walking  [] Pain in feet at rest  [] Pain in feet when laying flat   [] History of DVT   [] Phlebitis   [x] Swelling in legs   [] Varicose veins   [x] Non-healing ulcers Pulmonary:   [] Uses home oxygen   [] Productive cough   [] Hemoptysis   [] Wheeze  [x] COPD   [] Asthma Neurologic:  [] Dizziness  [] Blackouts   [] Seizures   [] History of stroke   [] History of TIA  [] Aphasia   [] Temporary blindness   [] Dysphagia   [] Weakness or numbness in arms   [] Weakness or numbness in legs Musculoskeletal:  [x] Arthritis   [] Joint swelling   [x] Joint pain   [] Low back pain Hematologic:  [] Easy bruising  [] Easy  bleeding   [] Hypercoagulable state   [] Anemic   Gastrointestinal:  [] Blood in stool   [] Vomiting blood  [] Gastroesophageal reflux/heartburn   [] Abdominal pain Genitourinary:  [] Chronic kidney disease   [] Difficult urination  [] Frequent urination  [] Burning with urination   [] Hematuria Skin:  [] Rashes   [x] Ulcers   [x] Wounds Psychological:  [] History of anxiety   []  History of major depression.    Objective:   Physical Exam Gen:  WD/WN, Morbidly Obese. Head: [place]/AT, No temporalis wasting. Ear/Nose/Throat: Hearing grossly intact, nares w/o erythema or drainage, trachea midline Eyes: Conjunctiva clear. Sclera non-icteric Neck: Supple.  No JVD.  Pulmonary:  Good air movement, no use of accessory muscles.  Cardiac: RRR, normal S1, S2 Vascular:  Vessel Right Left  Radial Palpable Palpable  Ulnar Palpable Palpable  Brachial Palpable Palpable  Carotid Palpable, without bruit Palpable, without bruit  Aorta Not palpable N/A  Femoral Palpable Palpable  Popliteal Not Palpable 1+ Palpable  PT Not Palpable Not Palpable  DP Trace Palpable 1+ Palpable   Gastrointestinal: soft, non-tender/non-distended. No guarding/reflex.  Musculoskeletal: M/S 5/5 throughout.  No deformity or atrophy. Pale ulceration on the right posterior calf with poor granulation tissue and some fibrinous exudate. Sensation grossly intact in extremities.  Symmetrical.  Speech is fluent.  Psychiatric: Judgment intact, Mood & affect appropriate for pt's clinical situation. Dermatologic: Right calf ulceration as described above Lymphatic :  No Cervical, Axillary, or Inguinal lymphadenopathy.  BP 114/78   Pulse 82   Resp 16   Wt 267 lb (121.1 kg)   BMI 48.83 kg/m   Past Medical History:  Diagnosis Date  . Anemia   . Anxiety   . Arthritis   . CHF (congestive heart failure) ([facility])   . COPD (chronic obstructive pulmonary disease) ([facility])   . Coronary artery disease   . Diabetes mellitus without complication ([facility])   .  Fibromyalgia   . GERD (gastroesophageal reflux disease)   . Hypertension   . Peripheral vascular disease ([facility])   . Sleep apnea   . Stroke [facility])    Social History   Social History  . Marital status: Divorced    Spouse name: N/A  . Number of children: N/A  . Years of education: N/A   Occupational History  . Not on file.   Social History Main Topics  . Smoking status: Current Some Day Smoker    Packs/day: 0.25    Years: 38.00    Types: Cigarettes  . Smokeless tobacco: Current User  . Alcohol use No  . Drug use: No  . Sexual activity: Not on file   Other Topics Concern  . Not on file   Social History Narrative  . No narrative on file   Past Surgical History:  Procedure Laterality Date  . CESAREAN SECTION    . CORONARY ANGIOPLASTY WITH STENT PLACEMENT Left   . DILATION AND CURETTAGE OF UTERUS    . ENDARTERECTOMY Left 01/29/2015   Procedure: ENDARTERECTOMY CAROTID;  Surgeon: Annice NeedyJason S Dew, MD;  Location: [facility] ORS;  Service: Vascular;  Laterality: Left;  . PERIPHERAL VASCULAR CATHETERIZATION Right 05/27/2015   Procedure: Lower Extremity Angiography;  Surgeon: Annice NeedyJason S Dew, MD;  Location: [facility] IN[facility];  Service: Cardiovascular;  Laterality: Right;  . PERIPHERAL VASCULAR CATHETERIZATION  05/27/2015   Procedure: Lower Extremity Intervention;  Surgeon: Annice NeedyJason S Dew, MD;  Location: [facility] IN[facility];  Service: Cardiovascular;;  . TUBAL LIGATION     No family history on file.  Allergies  Allergen Reactions  . Niacin And Related Nausea And Vomiting       Assessment & Plan:  Patient present to review vascular studies. She was last seen on 09/09/16 referred by the wound care center for evaluation of a nonhealing ulceration in the right leg. This is now been present for about 8 months. She has a long vascular history and has previously undergone carotid endarterectomy for high-grade stenosis. She is also undergone lower extremity revascularization in the past. There is no  clear inciting event or causative factor that started the wound. It has not healed despite optimal wound care. She denies fever or chills or signs of systemic infection. She has a litany of vascular risk factors as described below history of previous vascular issues as described as well. The patient underwent an ABI which showed Right ABI: 1.02 and Left 1.05 (on 10/20/15, Right ABI: 1.09 and Left 1.13).  1. Atherosclerosis of native arteries of the extremities with ulceration (HCC) Patient with normal ABI's however non-healing ulceration present for eight months despite wound care. Recommend right lower extremity angiogram to assess for any contributing PAD. Procedure, risks and benefits explained to patient. All questions answered. Patient wishes to proceed.   2. Type 2 diabetes mellitus with complication, unspecified long term insulin use status (HCC) - Stable Encouraged good control as its slows the progression of atherosclerotic disease  3. Tobacco use disorder -  Stable I have discussed (approximately 5 minutes) with the patient the role of tobacco in the pathogenesis of atherosclerosis and its effect on the progression of the disease, impact on the durability of interventions and its limitations on the formation of collateral pathways. I have recommended absolute tobacco cessation. I have discussed various options available for assistance with tobacco cessation including over the counter methods (Nicotine gum, patch and lozenges). We also discussed prescription options (Chantix, Nicotine Inhaler / Nasal Spray). The patient is not interested in pursuing any prescription tobacco cessation options at this time. The patient voices their understanding.   Current Outpatient Prescriptions on File Prior to Visit  Medication Sig Dispense Refill  . aspirin EC 81 MG tablet Take 81 mg by mouth daily.    Marland Kitchen. buPROPion (WELLBUTRIN SR) 150 MG 12 hr tablet TAKE 1 TABLET BY MOUTH 3 TIMES A DAY FOR 3 DAYS THEN 1  TABLET TWICE A DAY AFTER  6  . BYDUREON 2 MG PEN INJECT SUBCUTANEOUSLY ONCE WEEKLY FOR HIGH BLOOD SUGAR  6  . carvedilol (COREG) 12.5 MG tablet Take 12.5 mg by mouth 2 (two) times daily with a meal.    . cetirizine (ZYRTEC) 10 MG tablet Take 10 mg by mouth daily.    . cholecalciferol (VITAMIN D) 1000 UNITS tablet Take 1,000 Units by mouth daily.    . clindamycin (CLEOCIN) 300 MG capsule Take 1 capsule (300 mg total) by mouth 4 (four) times daily. 28 capsule 0  . clopidogrel (PLAVIX) 75 MG tablet Take 75 mg by mouth daily.    . cyclobenzaprine (FLEXERIL) 10 MG tablet Take 10 mg by mouth at bedtime.    . docusate sodium (COLACE) 100 MG capsule Take 100 mg by mouth 2 (two) times daily.    . enalapril (VASOTEC) 10 MG tablet 1 BY MOUTH DAILY FOR HIGH BLOOD PRESSURE    . enalapril (VASOTEC) 20 MG tablet Take 20 mg by mouth daily.    . fexofenadine (ALLEGRA) 180 MG tablet Take by mouth.    . furosemide (LASIX) 40 MG tablet Take 40 mg by mouth daily.    Marland Kitchen. gabapentin (NEURONTIN) 300 MG capsule Take 300 mg by mouth daily as needed (for nerve pain).     Marland Kitchen. glipiZIDE (GLUCOTROL) 10 MG tablet Take 10 mg by mouth 2 (two) times daily before a meal.    . insulin glargine (LANTUS) 100 UNIT/ML injection Inject 50 Units into the skin daily.    . metFORMIN (GLUCOPHAGE) 1000 MG tablet Take 1,000 mg by mouth 2 (two) times daily with a meal.    . pantoprazole (PROTONIX) 40 MG tablet Take 40 mg by mouth daily.    . sertraline (ZOLOFT) 100 MG tablet Take 150 mg by mouth daily.     . simvastatin (ZOCOR) 40 MG tablet Take 40 mg by mouth daily.     . sitaGLIPtin (JANUVIA) 100 MG tablet Take 100 mg by mouth daily.    . traMADol (ULTRAM) 50 MG tablet Take 1 tablet (50 mg total) by mouth every 6 (six) hours as needed. 10 tablet 0  . traZODone (DESYREL) 50 MG tablet Take 50 mg by mouth at bedtime.     No current facility-administered medications on file prior to visit.     There are no Patient Instructions on file for  this visit. No Follow-up on file.   Millenia Waldvogel A Fusaye Wachtel, PA-C

## 2016-09-13 NOTE — Assessment & Plan Note (Signed)
blood pressure control important in reducing the progression of atherosclerotic disease. On appropriate oral medications.  

## 2016-09-13 NOTE — Assessment & Plan Note (Signed)
Status post left CEA previously. Checked earlier this year and doing well. To be checked again next year.

## 2016-09-13 NOTE — Patient Instructions (Signed)

## 2016-09-20 ENCOUNTER — Encounter
Admission: RE | Admit: 2016-09-20 | Discharge: 2016-09-20 | Disposition: A | Discharge status: Home / Self Care | Payer: Medicaid Other | Source: Ambulatory Visit | Attending: Vascular Surgery | Admitting: Vascular Surgery

## 2016-09-20 DIAGNOSIS — Z01812 Encounter for preprocedural laboratory examination: Secondary | ICD-10-CM | POA: Diagnosis present

## 2016-09-20 LAB — CREATININE, SERUM
CREATININE: 0.88 mg/dL (ref 0.44–1.00)
GFR calc Af Amer: >60 mL/min (ref >60)

## 2016-09-20 LAB — BUN: BUN: 18 mg/dL (ref 6–20)

## 2016-09-21 MED ORDER — DEXTROSE 5 % IV SOLN
1.5000 g | INTRAVENOUS | Status: DC
Start: 2016-09-21 — End: 2016-09-22

## 2016-09-22 ENCOUNTER — Ambulatory Visit
Admission: RE | Admit: 2016-09-22 | Discharge: 2016-09-22 | Disposition: A | Discharge status: Home / Self Care | Payer: Medicaid Other | Source: Ambulatory Visit | Attending: Vascular Surgery | Admitting: Vascular Surgery

## 2016-09-22 ENCOUNTER — Ambulatory Visit: Payer: Medicaid Other | Admitting: Surgery

## 2016-09-22 ENCOUNTER — Encounter
Admission: RE | Disposition: A | Discharge status: Home / Self Care | Payer: Self-pay | Source: Ambulatory Visit | Attending: Vascular Surgery

## 2016-09-22 DIAGNOSIS — I70238 Atherosclerosis of native arteries of right leg with ulceration of other part of lower right leg: Secondary | ICD-10-CM | POA: Diagnosis not present

## 2016-09-22 DIAGNOSIS — I509 Heart failure, unspecified: Secondary | ICD-10-CM | POA: Diagnosis not present

## 2016-09-22 DIAGNOSIS — L97819 Non-pressure chronic ulcer of other part of right lower leg with unspecified severity: Secondary | ICD-10-CM | POA: Diagnosis not present

## 2016-09-22 DIAGNOSIS — Z79899 Other long term (current) drug therapy: Secondary | ICD-10-CM | POA: Insufficient documentation

## 2016-09-22 DIAGNOSIS — Z7982 Long term (current) use of aspirin: Secondary | ICD-10-CM | POA: Diagnosis not present

## 2016-09-22 DIAGNOSIS — I6529 Occlusion and stenosis of unspecified carotid artery: Secondary | ICD-10-CM | POA: Insufficient documentation

## 2016-09-22 DIAGNOSIS — Z794 Long term (current) use of insulin: Secondary | ICD-10-CM | POA: Insufficient documentation

## 2016-09-22 DIAGNOSIS — F17210 Nicotine dependence, cigarettes, uncomplicated: Secondary | ICD-10-CM | POA: Diagnosis not present

## 2016-09-22 DIAGNOSIS — E119 Type 2 diabetes mellitus without complications: Secondary | ICD-10-CM | POA: Diagnosis not present

## 2016-09-22 DIAGNOSIS — K219 Gastro-esophageal reflux disease without esophagitis: Secondary | ICD-10-CM | POA: Insufficient documentation

## 2016-09-22 DIAGNOSIS — I110 Hypertensive heart disease with heart failure: Secondary | ICD-10-CM | POA: Diagnosis not present

## 2016-09-22 DIAGNOSIS — Z9889 Other specified postprocedural states: Secondary | ICD-10-CM | POA: Insufficient documentation

## 2016-09-22 HISTORY — PX: PERIPHERAL VASCULAR CATHETERIZATION: SHX172C

## 2016-09-22 LAB — GLUCOSE, CAPILLARY: GLUCOSE-CAPILLARY: 216 mg/dL — AB (ref 65–99)

## 2016-09-22 SURGERY — LOWER EXTREMITY ANGIOGRAPHY
Anesthesia: Moderate Sedation | Site: Leg Lower | Laterality: Right

## 2016-09-22 MED ORDER — HYDRALAZINE HCL 20 MG/ML IJ SOLN: 5.0000 mg | INTRAMUSCULAR | Status: DC | PRN

## 2016-09-22 MED ORDER — SODIUM CHLORIDE 0.9 % IV SOLN
INTRAVENOUS | Status: DC
  Administered 2016-09-22: 50 mL/h via INTRAVENOUS

## 2016-09-22 MED ORDER — MIDAZOLAM HCL 2 MG/2ML IJ SOLN
INTRAMUSCULAR | Status: DC | PRN
Start: 2016-09-22 — End: 2016-09-22
  Administered 2016-09-22 (×2): 2 mg via INTRAVENOUS

## 2016-09-22 MED ORDER — LABETALOL HCL 5 MG/ML IV SOLN: 10.0000 mg | INTRAVENOUS | Status: DC | PRN

## 2016-09-22 MED ORDER — HEPARIN SODIUM (PORCINE) 1000 UNIT/ML IJ SOLN
INTRAMUSCULAR | Status: AC
  Filled 2016-09-22: qty 1

## 2016-09-22 MED ORDER — ONDANSETRON HCL 4 MG/2ML IJ SOLN: 4.0000 mg | Freq: Four times a day (QID) | INTRAMUSCULAR | Status: DC | PRN

## 2016-09-22 MED ORDER — LIDOCAINE HCL (PF) 1 % IJ SOLN
INTRAMUSCULAR | Status: AC
  Filled 2016-09-22: qty 30

## 2016-09-22 MED ORDER — FENTANYL CITRATE (PF) 100 MCG/2ML IJ SOLN
INTRAMUSCULAR | Status: DC | PRN
  Administered 2016-09-22 (×2): 50 ug via INTRAVENOUS

## 2016-09-22 MED ORDER — HEPARIN (PORCINE) IN NACL 2-0.9 UNIT/ML-% IJ SOLN
INTRAMUSCULAR | Status: AC
  Filled 2016-09-22: qty 1000

## 2016-09-22 MED ORDER — HEPARIN SODIUM (PORCINE) 1000 UNIT/ML IJ SOLN
INTRAMUSCULAR | Status: DC | PRN
  Administered 2016-09-22: 5000 [IU] via INTRAVENOUS

## 2016-09-22 MED ORDER — IOPAMIDOL (ISOVUE-300) INJECTION 61%
INTRAVENOUS | Status: DC | PRN
  Administered 2016-09-22: 55 mL via INTRA_ARTERIAL

## 2016-09-22 MED ORDER — SODIUM CHLORIDE 0.9 % IV SOLN: 500.0000 mL | Freq: Once | INTRAVENOUS | Status: DC | PRN

## 2016-09-22 MED ORDER — HYDROMORPHONE HCL 1 MG/ML IJ SOLN: 1.0000 mg | Freq: Once | INTRAMUSCULAR | Status: DC

## 2016-09-22 MED ORDER — GUAIFENESIN-DM 100-10 MG/5ML PO SYRP: 15.0000 mL | ORAL_SOLUTION | ORAL | Status: DC | PRN

## 2016-09-22 MED ORDER — FENTANYL CITRATE (PF) 100 MCG/2ML IJ SOLN
INTRAMUSCULAR | Status: AC
  Filled 2016-09-22: qty 2

## 2016-09-22 MED ORDER — OXYCODONE-ACETAMINOPHEN 5-325 MG PO TABS: 1.0000 | ORAL_TABLET | ORAL | Status: DC | PRN

## 2016-09-22 MED ORDER — METOPROLOL TARTRATE 5 MG/5ML IV SOLN: 2.0000 mg | INTRAVENOUS | Status: DC | PRN

## 2016-09-22 MED ORDER — METHYLPREDNISOLONE SODIUM SUCC 125 MG IJ SOLR: 125.0000 mg | INTRAMUSCULAR | Status: DC | PRN

## 2016-09-22 MED ORDER — CEFAZOLIN IN D5W 1 GM/50ML IV SOLN
1.0000 g | Freq: Once | INTRAVENOUS | Status: AC
  Administered 2016-09-22: 1 g via INTRAVENOUS

## 2016-09-22 MED ORDER — FAMOTIDINE 20 MG PO TABS: 40.0000 mg | ORAL_TABLET | ORAL | Status: DC | PRN

## 2016-09-22 MED ORDER — HYDROMORPHONE HCL 1 MG/ML IJ SOLN: 0.5000 mg | INTRAMUSCULAR | Status: DC | PRN

## 2016-09-22 MED ORDER — ACETAMINOPHEN 325 MG PO TABS: 325.0000 mg | ORAL_TABLET | ORAL | Status: DC | PRN

## 2016-09-22 MED ORDER — MIDAZOLAM HCL 2 MG/2ML IJ SOLN
INTRAMUSCULAR | Status: AC
  Filled 2016-09-22: qty 4

## 2016-09-22 MED ORDER — MIDAZOLAM HCL 2 MG/2ML IJ SOLN
INTRAMUSCULAR | Status: AC
  Filled 2016-09-22: qty 2

## 2016-09-22 MED ORDER — PHENOL 1.4 % MT LIQD
1.0000 | OROMUCOSAL | Status: DC | PRN
  Filled 2016-09-22: qty 177

## 2016-09-22 MED ORDER — ACETAMINOPHEN 325 MG RE SUPP: 325.0000 mg | RECTAL | Status: DC | PRN

## 2016-09-22 SURGICAL SUPPLY — 16 items
BALLN LUTONIX 6X150X130 (BALLOONS) ×6
BALLOON LUTONIX 6X150X130 (BALLOONS) ×2 IMPLANT
CANNULA 5F STIFF (CANNULA) ×3 IMPLANT
CATH PIG 70CM (CATHETERS) ×3 IMPLANT
CATH VERT 100CM (CATHETERS) ×3 IMPLANT
DEVICE PRESTO INFLATION (MISCELLANEOUS) ×3 IMPLANT
DEVICE STARCLOSE SE CLOSURE (Vascular Products) ×3 IMPLANT
GLIDEWIRE ADV .035X260CM (WIRE) ×3 IMPLANT
GUIDEWIRE SUPER STIFF .035X180 (WIRE) ×3 IMPLANT
PACK ANGIOGRAPHY (CUSTOM PROCEDURE TRAY) ×3 IMPLANT
SHEATH ANL2 6FRX45 HC (SHEATH) ×3 IMPLANT
SHEATH BRITE TIP 4FRX11 (SHEATH) ×3 IMPLANT
SHEATH BRITE TIP 5FRX11 (SHEATH) ×3 IMPLANT
SYR MEDRAD MARK V 150ML (SYRINGE) ×3 IMPLANT
TUBING CONTRAST HIGH PRESS 72 (TUBING) ×3 IMPLANT
WIRE J 3MM .035X145CM (WIRE) ×3 IMPLANT

## 2016-09-22 NOTE — Op Note (Signed)
[facility] Percutaneous Study/Intervention Procedural Note   Date of Surgery: 09/22/2016  Surgeon(s):Parsa Rickett   Assistants:none  Pre-operative Diagnosis: PAD with ulceration right lower extremity  Post-operative diagnosis: Same  Procedure(s) Performed: 1. Ultrasound guidance for vascular access left femoral artery 2. Catheter placement into right popliteal artery from left femoral approach 3. Aortogram and selective right lower extremity angiogram 4. Percutaneous transluminal angioplasty of the majority of the superficial femoral artery with in and just above and below the previously placed stents with two 6 mm diameter by 15 cm length Lutonix drug-coated angioplasty balloons 5. StarClose closure device left femoral artery  EBL: 30 cc  Contrast: 55 cc  Fluoro Time: 5 minutes  Moderate Conscious Sedation Time: approximately 30 minutes using 4 mg of Versed and 100 mcg of Fentanyl  Indications: Patient is a 55 y.o.female with peripheral arterial disease history and a previous intervention now with a nonhealing ulceration on the leg with previous intervention. The patient is brought in for angiography for further evaluation and potential treatment. Risks and benefits are discussed and informed consent is obtained  Procedure: The patient was identified and appropriate procedural time out was performed. The patient was then placed supine on the table and prepped and draped in the usual sterile fashion.Moderate conscious sedation was administered during a face to face encounter with the patient throughout the procedure with my supervision of the RN administering medicines and monitoring the patient's vital signs, pulse oximetry, telemetry and mental status throughout from the start of the procedure until the patient was taken to the recovery room. Ultrasound was used to evaluate the  left common femoral artery. It was patent . A digital ultrasound image was acquired. A Seldinger needle was used to access the left common femoral artery under direct ultrasound guidance and a permanent image was performed. A 0.035 J wire was advanced without resistance and a 5Fr sheath was placed. Pigtail catheter was placed into the aorta and an AP aortogram was performed. This demonstrated normal renal arteries and normal aorta and iliac segments without significant stenosis. I then crossed the aortic bifurcation and advanced to the right femoral head. Selective right lower extremity angiogram was then performed. This demonstrated diffuse intimal hyperplasia within the previously placed SFA stent with an area in the mid segment that was nearly occlusive in its stenosis as well as multiple areas of greater than 50% stenosis with and and just beyond just proximal to the previously placed stent. Popliteal artery was mildly irregular but had less than 30% stenosis. There was then two-vessel runoff distally with an anterior tibial artery and peroneal artery without focal stenosis. The patient was systemically heparinized and a 6 [place] Ansell sheath was then placed over the [employer] wire. I then used a Kumpe catheter and the advantage wire to navigate down the SFA through the stenoses and confirm intraluminal flow in the popliteal artery. I then replaced the wire and proceeded with treatment. The area was long enough that I needed two 15 cm length Lutonix drug-coated angioplasty balloons to treat the entire area. I selected 6 mm balloons for both. Each inflation was from 8-10 atm for 1 minute. Completion angiogram showed markedly improved flow with no greater than 20% residual stenosis identified in any of the areas treated. I elected to terminate the procedure. The sheath was removed and StarClose closure device was deployed in the left femoral artery with excellent hemostatic result. The patient was  taken to the recovery room in stable condition having  tolerated the procedure well.  Findings:  Aortogram: normal renal arteries, normal aorta and iliac arteries Right Lower Extremity: There was diffuse intimal hyperplasia within the previously placed SFA stent with an area in the mid segment that was nearly occlusive in its stenosis as well as multiple areas of greater than 50% stenosis with and and just beyond just proximal to the previously placed stent. Popliteal artery was mildly irregular but had less than 30% stenosis. There was then two-vessel runoff distally with an anterior tibial artery and peroneal artery without focal stenosis.   Disposition: Patient was taken to the recovery room in stable condition having tolerated the procedure well.  Complications: None  Leotis Pain 09/22/2016 10:12 AM   This note was created with Dragon Medical transcription system. Any errors in dictation are purely unintentional.

## 2016-09-22 NOTE — H&P (Signed)
[facility] History & Physical Update  The patient was interviewed and re-examined.  The patient's previous History and Physical has been reviewed and is unchanged.  There is no change in the plan of care. We plan to proceed with the scheduled procedure.  Festus BarrenJason Dew, MD  09/22/2016, 9:16 AM

## 2016-09-23 ENCOUNTER — Encounter: Payer: Self-pay | Admitting: Vascular Surgery

## 2016-09-29 ENCOUNTER — Ambulatory Visit: Payer: Medicaid Other | Admitting: Surgery

## 2016-10-03 ENCOUNTER — Encounter: Payer: Medicaid Other | Attending: Surgery | Admitting: Surgery

## 2016-10-03 DIAGNOSIS — D649 Anemia, unspecified: Secondary | ICD-10-CM | POA: Insufficient documentation

## 2016-10-03 DIAGNOSIS — I70232 Atherosclerosis of native arteries of right leg with ulceration of calf: Secondary | ICD-10-CM | POA: Diagnosis not present

## 2016-10-03 DIAGNOSIS — Z6841 Body Mass Index (BMI) 40.0 and over, adult: Secondary | ICD-10-CM | POA: Diagnosis not present

## 2016-10-03 DIAGNOSIS — F17218 Nicotine dependence, cigarettes, with other nicotine-induced disorders: Secondary | ICD-10-CM | POA: Diagnosis not present

## 2016-10-03 DIAGNOSIS — K219 Gastro-esophageal reflux disease without esophagitis: Secondary | ICD-10-CM | POA: Insufficient documentation

## 2016-10-03 DIAGNOSIS — M1990 Unspecified osteoarthritis, unspecified site: Secondary | ICD-10-CM | POA: Insufficient documentation

## 2016-10-03 DIAGNOSIS — E1140 Type 2 diabetes mellitus with diabetic neuropathy, unspecified: Secondary | ICD-10-CM | POA: Insufficient documentation

## 2016-10-03 DIAGNOSIS — E11622 Type 2 diabetes mellitus with other skin ulcer: Secondary | ICD-10-CM | POA: Insufficient documentation

## 2016-10-03 DIAGNOSIS — I509 Heart failure, unspecified: Secondary | ICD-10-CM | POA: Insufficient documentation

## 2016-10-03 DIAGNOSIS — M797 Fibromyalgia: Secondary | ICD-10-CM | POA: Diagnosis not present

## 2016-10-03 DIAGNOSIS — J449 Chronic obstructive pulmonary disease, unspecified: Secondary | ICD-10-CM | POA: Diagnosis not present

## 2016-10-03 DIAGNOSIS — L97212 Non-pressure chronic ulcer of right calf with fat layer exposed: Secondary | ICD-10-CM | POA: Insufficient documentation

## 2016-10-03 DIAGNOSIS — I110 Hypertensive heart disease with heart failure: Secondary | ICD-10-CM | POA: Diagnosis not present

## 2016-10-03 DIAGNOSIS — Z794 Long term (current) use of insulin: Secondary | ICD-10-CM | POA: Diagnosis not present

## 2016-10-03 DIAGNOSIS — Z79899 Other long term (current) drug therapy: Secondary | ICD-10-CM | POA: Insufficient documentation

## 2016-10-03 DIAGNOSIS — G4730 Sleep apnea, unspecified: Secondary | ICD-10-CM | POA: Diagnosis not present

## 2016-10-03 DIAGNOSIS — Z8673 Personal history of transient ischemic attack (TIA), and cerebral infarction without residual deficits: Secondary | ICD-10-CM | POA: Diagnosis not present

## 2016-10-03 NOTE — Progress Notes (Signed)
SHAMIR, SEDLAR ([id number]) Visit Report for 10/03/2016 Arrival Information Details Patient Name: [name], [name]. Date of Service: 10/03/2016 1:15 PM Medical Record Number: [id number] Patient Account Number: [id number] Date of Birth/Sex: 1960/11/17 (56 y.o. Female) Treating RN: Montey Hora Primary Care Physician: Freddy Finner Other Clinician: Referring Physician: Freddy Finner Treating Physician/Extender: Frann Rider in Treatment: 4 Visit Information History Since Last Visit Added or deleted any medications: No Patient Arrived: Wheel Chair Any new allergies or adverse reactions: No Arrival Time: 13:32 Had a fall or experienced change in No Accompanied By: dtr activities of daily living that may affect Transfer Assistance: None risk of falls: Patient Identification Verified: Yes Signs or symptoms of abuse/neglect since last No Secondary Verification Process Yes visito Completed: Hospitalized since last visit: No Patient Requires Transmission- No Has Dressing in Place as Prescribed: No Based Precautions: Pain Present Now: Yes Patient Has Alerts: Yes Patient Alerts: Patient on Blood Thinner DM II Plavix Electronic Signature(s) Signed: 10/03/2016 2:43:43 PM By: Montey Hora Entered By: Montey Hora on 10/03/2016 13:34:08 [place], [name] ([id number]) -------------------------------------------------------------------------------- Clinic Level of Care Assessment Details Patient Name: [name]. Date of Service: 10/03/2016 1:15 PM Medical Record Number: [id number] Patient Account Number: [id number] Date of Birth/Sex: 05-Jun-1961 (56 y.o. Female) Treating RN: Montey Hora Primary Care Physician: Freddy Finner Other Clinician: Referring Physician: Freddy Finner Treating Physician/Extender: Frann Rider in Treatment: 4 Clinic Level of Care Assessment Items TOOL 4 Quantity Score []  - Use when only an EandM is performed on FOLLOW-UP visit 0 ASSESSMENTS -  Nursing Assessment / Reassessment X - Reassessment of Co-morbidities (includes updates in patient status) 1 10 X - Reassessment of Adherence to Treatment Plan 1 5 ASSESSMENTS - Wound and Skin Assessment / Reassessment X - Simple Wound Assessment / Reassessment - one wound 1 5 []  - Complex Wound Assessment / Reassessment - multiple wounds 0 []  - Dermatologic / Skin Assessment (not related to wound area) 0 ASSESSMENTS - Focused Assessment []  - Circumferential Edema Measurements - multi extremities 0 []  - Nutritional Assessment / Counseling / Intervention 0 X - Lower Extremity Assessment (monofilament, tuning fork, pulses) 1 5 []  - Peripheral Arterial Disease Assessment (using hand held doppler) 0 ASSESSMENTS - Ostomy and/or Continence Assessment and Care []  - Incontinence Assessment and Management 0 []  - Ostomy Care Assessment and Management (repouching, etc.) 0 PROCESS - Coordination of Care X - Simple Patient / Family Education for ongoing care 1 15 []  - Complex (extensive) Patient / Family Education for ongoing care 0 []  - Staff obtains Programmer, systems, Records, Test Results / Process Orders 0 []  - Staff telephones HHA, Nursing Homes / Clarify orders / etc 0 []  - Routine Transfer to another Facility (non-emergent condition) 0 [name], [name]. ([id number]) []  - Routine Hospital Admission (non-emergent condition) 0 []  - New Admissions / Biomedical engineer / Ordering NPWT, Apligraf, etc. 0 []  - Emergency Hospital Admission (emergent condition) 0 X - Simple Discharge Coordination 1 10 []  - Complex (extensive) Discharge Coordination 0 PROCESS - Special Needs []  - Pediatric / Minor Patient Management 0 []  - Isolation Patient Management 0 []  - Hearing / Language / Visual special needs 0 []  - Assessment of Community assistance (transportation, D/Gray planning, etc.) 0 []  - Additional assistance / Altered mentation 0 []  - Support Surface(s) Assessment (bed, cushion, seat, etc.) 0 INTERVENTIONS -  Wound Cleansing / Measurement X - Simple Wound Cleansing - one wound 1 5 []  - Complex Wound Cleansing - multiple wounds 0 X - Wound  Imaging (photographs - any number of wounds) 1 5 []  - Wound Tracing (instead of photographs) 0 X - Simple Wound Measurement - one wound 1 5 []  - Complex Wound Measurement - multiple wounds 0 INTERVENTIONS - Wound Dressings X - Small Wound Dressing one or multiple wounds 1 10 []  - Medium Wound Dressing one or multiple wounds 0 []  - Large Wound Dressing one or multiple wounds 0 X - Application of Medications - topical 1 5 []  - Application of Medications - injection 0 INTERVENTIONS - Miscellaneous []  - External ear exam 0 Foot, [place] Gray. ([id number]) []  - Specimen Collection (cultures, biopsies, blood, body fluids, etc.) 0 []  - Specimen(s) / Culture(s) sent or taken to Lab for analysis 0 []  - Patient Transfer (multiple staff / Harrel Lemon Lift / Similar devices) 0 []  - Simple Staple / Suture removal (25 or less) 0 []  - Complex Staple / Suture removal (26 or more) 0 []  - Hypo / Hyperglycemic Management (close monitor of Blood Glucose) 0 []  - Ankle / Brachial Index (ABI) - do not check if billed separately 0 X - Vital Signs 1 5 Has the patient been seen at the hospital within the last three years: Yes Total Score: 85 Level Of Care: New/Established - Level 3 Electronic Signature(s) Signed: 10/03/2016 2:43:43 PM By: Montey Hora Entered By: Montey Hora on 10/03/2016 14:37:43 [name], [name] ([id number]) -------------------------------------------------------------------------------- Encounter Discharge Information Details Patient Name: [name]. Date of Service: 10/03/2016 1:15 PM Medical Record Number: [id number] Patient Account Number: [id number] Date of Birth/Sex: [birth year] (56 y.o. Female) Treating RN: Montey Hora Primary Care Physician: Freddy Finner Other Clinician: Referring Physician: Freddy Finner Treating Physician/Extender: Frann Rider in Treatment: 4 Encounter Discharge Information Items Discharge Pain Level: 0 Discharge Condition: Stable Ambulatory Status: Wheelchair Discharge Destination: Home Transportation: Private Auto Accompanied By: dtr Schedule Follow-up Appointment: Yes Medication Reconciliation completed No and provided to Patient/Care Glenyce Randle: Provided on Clinical Summary of Care: 10/03/2016 Form Type Recipient Paper Patient VS Electronic Signature(s) Signed: 10/03/2016 2:09:57 PM By: Ruthine Dose Entered By: Ruthine Dose on 10/03/2016 14:09:57 Gens, Caira Loletha Gray ([id number]) -------------------------------------------------------------------------------- Lower Extremity Assessment Details Patient Name: [name] Doe, Ahniyah Gray. Date of Service: 10/03/2016 1:15 PM Medical Record Number: [id number] Patient Account Number: [id number] Date of Birth/Sex: 1961/03/06 (56 y.o. Female) Treating RN: Montey Hora Primary Care Physician: Freddy Finner Other Clinician: Referring Physician: Freddy Finner Treating Physician/Extender: Frann Rider in Treatment: 4 Edema Assessment Assessed: [Left: No] [Right: No] Edema: [Left: Ye] [Right: s] Vascular Assessment Pulses: Dorsalis Pedis Palpable: [Right:Yes] Posterior Tibial Extremity colors, hair growth, and conditions: Extremity Color: [Right:Normal] Hair Growth on Extremity: [Right:No] Temperature of Extremity: [Right:Warm] Capillary Refill: [Right:< 3 seconds] Electronic Signature(s) Signed: 10/03/2016 2:43:43 PM By: Montey Hora Entered By: Montey Hora on 10/03/2016 13:42:17 Janvier, Cyndee CMarland Kitchen ([id number]) -------------------------------------------------------------------------------- Multi Wound Chart Details Patient Name: [name]. Date of Service: 10/03/2016 1:15 PM Medical Record Number: [id number] Patient Account Number: [id number] Date of Birth/Sex: Oct 26, 1960 (56 y.o. Female) Treating RN: Montey Hora Primary Care Physician:  Freddy Finner Other Clinician: Referring Physician: Freddy Finner Treating Physician/Extender: Frann Rider in Treatment: 4 Vital Signs Height(in): 62 Pulse(bpm): 92 Weight(lbs): 276 Blood Pressure 118/61 (mmHg): Body Mass Index(BMI): 50 Temperature(F): 98.3 Respiratory Rate 18 (breaths/min): Photos: [N/A:N/A] Wound Location: Right Lower Leg - N/A N/A Posterior Wounding Event: Gradually Appeared N/A N/A Primary Etiology: Diabetic Wound/Ulcer of N/A N/A the Lower Extremity Comorbid History: Anemia, Chronic N/A N/A Obstructive Pulmonary Disease (COPD), Sleep Apnea, Congestive Heart Failure, Coronary Artery  Disease, Hypertension, Peripheral Venous Disease, Type II Diabetes, Osteoarthritis, Neuropathy Date Acquired: 01/05/2016 N/A N/A Weeks of Treatment: 4 N/A N/A Wound Status: Open N/A N/A Measurements L x W x D 0.9x1.6x0.2 N/A N/A (cm) Area (cm) : 1.131 N/A N/A Volume (cm) : 0.226 N/A N/A % Reduction in Area: -6.70% N/A N/A % Reduction in Volume: -113.20% N/A N/A [name], [name]. ([id number]) Classification: Grade 1 N/A N/A Exudate Amount: Large N/A N/A Exudate Type: Serous N/A N/A Exudate Color: amber N/A N/A Wound Margin: Distinct, outline attached N/A N/A Granulation Amount: None Present (0%) N/A N/A Necrotic Amount: Large (67-100%) N/A N/A Exposed Structures: Fascia: No N/A N/A Fat: No Tendon: No Muscle: No Joint: No Bone: No Limited to Skin Breakdown Epithelialization: None N/A N/A Periwound Skin Texture: Edema: Yes N/A N/A Periwound Skin Moist: Yes N/A N/A Moisture: Periwound Skin Color: Erythema: Yes N/A N/A Erythema Location: Circumferential N/A N/A Temperature: No Abnormality N/A N/A Tenderness on Yes N/A N/A Palpation: Wound Preparation: Ulcer Cleansing: N/A N/A Rinsed/Irrigated with Saline Topical Anesthetic Applied: Other: lidocaine 4% Treatment Notes Electronic Signature(s) Signed: 10/03/2016 1:58:39 PM By: Christin Fudge  MD, FACS Entered By: Christin Fudge on 10/03/2016 13:58:39 [name], [name] ([id number]) -------------------------------------------------------------------------------- [place] Details Patient Name: [name]. Date of Service: 10/03/2016 1:15 PM Medical Record Number: [id number] Patient Account Number: [id number] Date of Birth/Sex: 01/09/1961 (56 y.o. Female) Treating RN: Montey Hora Primary Care Physician: Freddy Finner Other Clinician: Referring Physician: Freddy Finner Treating Physician/Extender: Frann Rider in Treatment: 4 Active Inactive Abuse / Safety / Falls / Self Care Management Nursing Diagnoses: Potential for falls Goals: Patient will remain injury free Date Initiated: 09/05/2016 Goal Status: Active Interventions: Assess fall risk on admission and as needed Assess impairment of mobility on admission and as needed per policy Notes: Nutrition Nursing Diagnoses: Imbalanced nutrition Impaired glucose control: actual or potential Goals: Patient/caregiver agrees to and verbalizes understanding of need to use nutritional supplements and/or vitamins as prescribed Date Initiated: 09/05/2016 Goal Status: Active Patient/caregiver verbalizes understanding of need to maintain therapeutic glucose control per primary care physician Date Initiated: 09/05/2016 Goal Status: Active Patient/caregiver will maintain therapeutic glucose control Date Initiated: 09/05/2016 Goal Status: Active Interventions: Assess patient nutrition upon admission and as needed per policy [name], [name]. ([id number]) Provide education on elevated blood sugars and impact on wound healing Provide education on nutrition Treatment Activities: Education provided on Nutrition : 09/05/2016 Notes: Orientation to the Wound Care Program Nursing Diagnoses: Knowledge deficit related to the wound healing center program Goals: Patient/caregiver will verbalize understanding of  the [place] Program Date Initiated: 09/05/2016 Goal Status: Active Interventions: Provide education on orientation to the wound center Notes: Pain, Acute or Chronic Nursing Diagnoses: Pain, acute or chronic: actual or potential Potential alteration in comfort, pain Goals: Patient will verbalize adequate pain control and receive pain control interventions during procedures as needed Date Initiated: 09/05/2016 Goal Status: Active Patient/caregiver will verbalize adequate pain control between visits Date Initiated: 09/05/2016 Goal Status: Active Patient/caregiver will verbalize comfort level met Date Initiated: 09/05/2016 Goal Status: Active Interventions: Assess comfort goal upon admission Complete pain assessment as per visit requirements Notes: [name], MATTERA. ([id number]) Wound/Skin Impairment Nursing Diagnoses: Impaired tissue integrity Knowledge deficit related to smoking impact on wound healing Knowledge deficit related to ulceration/compromised skin integrity Goals: Ulcer/skin breakdown will have a volume reduction of 30% by week 4 Date Initiated: 09/05/2016 Goal Status: Active Ulcer/skin breakdown will have a volume reduction of 50% by week 8 Date Initiated:  09/05/2016 Goal Status: Active Ulcer/skin breakdown will have a volume reduction of 80% by week 12 Date Initiated: 09/05/2016 Goal Status: Active Interventions: Assess patient/caregiver ability to perform ulcer/skin care regimen upon admission and as needed Assess ulceration(s) every visit Provide education on smoking Notes: Electronic Signature(s) Signed: 10/03/2016 2:43:43 PM By: Montey Hora Entered By: Montey Hora on 10/03/2016 13:42:39 [name], [name] ([id number]) -------------------------------------------------------------------------------- Pain Assessment Details Patient Name: [name]. Date of Service: 10/03/2016 1:15 PM Medical Record Number: [id number] Patient Account  Number: [id number] Date of Birth/Sex: 10-11-1960 (56 y.o. Female) Treating RN: Montey Hora Primary Care Physician: Freddy Finner Other Clinician: Referring Physician: Freddy Finner Treating Physician/Extender: Frann Rider in Treatment: 4 Active Problems Location of Pain Severity and Description of Pain Patient Has Paino Yes Site Locations Pain Location: Pain in Ulcers With Dressing Change: Yes Duration of the Pain. Constant / Intermittento Constant Pain Management and Medication Current Pain Management: Notes Topical or injectable lidocaine is offered to patient for acute pain when surgical debridement is performed. If needed, Patient is instructed to use over the counter pain medication for the following 24-48 hours after debridement. Wound care MDs do not prescribed pain medications. Patient has chronic pain or uncontrolled pain. Patient has been instructed to make an appointment with their Primary Care Physician for pain management. Electronic Signature(s) Signed: 10/03/2016 2:43:43 PM By: Montey Hora Entered By: Montey Hora on 10/03/2016 13:35:22 [name], [name] ([id number]) -------------------------------------------------------------------------------- Patient/Caregiver Education Details Patient Name: [name] Date of Service: 10/03/2016 1:15 PM Medical Record Number: [id number] Patient Account Number: [id number] Date of Birth/Gender: 1961-02-08 (56 y.o. Female) Treating RN: Montey Hora Primary Care Physician: Freddy Finner Other Clinician: Referring Physician: Freddy Finner Treating Physician/Extender: Frann Rider in Treatment: 4 Education Assessment Education Provided To: Patient and Caregiver Education Topics Provided Wound/Skin Impairment: Handouts: Other: wound care as ordered Methods: Demonstration, Explain/Verbal Responses: State content correctly Electronic Signature(s) Signed: 10/03/2016 2:43:43 PM By: Montey Hora Entered  By: Montey Hora on 10/03/2016 14:38:49 [name], [name] ([id number]) -------------------------------------------------------------------------------- Wound Assessment Details Patient Name: [name] Doe, Nevin Gray. Date of Service: 10/03/2016 1:15 PM Medical Record Number: [id number] Patient Account Number: [id number] Date of Birth/Sex: Jan 27, 1961 (56 y.o. Female) Treating RN: Montey Hora Primary Care Physician: Freddy Finner Other Clinician: Referring Physician: Freddy Finner Treating Physician/Extender: Frann Rider in Treatment: 4 Wound Status Wound Number: 1 Primary Diabetic Wound/Ulcer of the Lower Etiology: Extremity Wound Location: Right Lower Leg - Posterior Wound Open Wounding Event: Gradually Appeared Status: Date Acquired: 01/05/2016 Comorbid Anemia, Chronic Obstructive Pulmonary Weeks Of Treatment: 4 History: Disease (COPD), Sleep Apnea, Clustered Wound: No Congestive Heart Failure, Coronary Artery Disease, Hypertension, Peripheral Venous Disease, Type II Diabetes, Osteoarthritis, Neuropathy Photos Wound Measurements Length: (cm) 0.9 Width: (cm) 1.6 Depth: (cm) 0.2 Area: (cm) 1.131 Volume: (cm) 0.226 % Reduction in Area: -6.7% % Reduction in Volume: -113.2% Epithelialization: None Tunneling: No Undermining: No Wound Description Classification: Grade 1 Foul Odor Aft Wound Margin: Distinct, outline attached Exudate Amount: Large Exudate Type: Serous Exudate Color: amber er Cleansing: No Wound Bed Granulation Amount: None Present (0%) Exposed Structure Necrotic Amount: Large (67-100%) Fascia Exposed: No Lorenzen, Raghad Gray. ([id number]) Necrotic Quality: Adherent Slough Fat Layer Exposed: No Tendon Exposed: No Muscle Exposed: No Joint Exposed: No Bone Exposed: No Limited to Skin Breakdown Periwound Skin Texture Texture Color No Abnormalities Noted: No No Abnormalities Noted: No Localized Edema: Yes Erythema: Yes Erythema Location:  Circumferential Moisture No Abnormalities Noted: No Temperature / Pain Moist: Yes Temperature: No Abnormality Tenderness  on Palpation: Yes Wound Preparation Ulcer Cleansing: Rinsed/Irrigated with Saline Topical Anesthetic Applied: Other: lidocaine 4%, Treatment Notes Wound #1 (Right, Posterior Lower Leg) 1. Cleansed with: Clean wound with Normal Saline 2. Anesthetic Topical Lidocaine 4% cream to wound bed prior to debridement 4. Dressing Applied: Santyl Ointment 5. Secondary Dressing Applied Dry [place] Signature(s) Signed: 10/03/2016 2:43:43 PM By: Montey Hora Entered By: Montey Hora on 10/03/2016 13:41:48 [place], [name] ([id number]) -------------------------------------------------------------------------------- [place] Details Patient Name: [name]. Date of Service: 10/03/2016 1:15 PM Medical Record Number: [id number] Patient Account Number: [id number] Date of Birth/Sex: 25-Jul-1961 (56 y.o. Female) Treating RN: Montey Hora Primary Care Physician: Freddy Finner Other Clinician: Referring Physician: Freddy Finner Treating Physician/Extender: Frann Rider in Treatment: 4 Vital Signs Time Taken: 13:35 Temperature (F): 98.3 Height (in): 62 Pulse (bpm): 92 Weight (lbs): 276 Respiratory Rate (breaths/min): 18 Body Mass Index (BMI): 50.5 Blood Pressure (mmHg): 118/61 Reference Range: 80 - 120 mg / dl Electronic Signature(s) Signed: 10/03/2016 2:43:43 PM By: Montey Hora Entered By: Montey Hora on 10/03/2016 13:35:45

## 2016-10-03 NOTE — Progress Notes (Addendum)
Cynthia, Gray ([id number]) Visit Report for 10/03/2016 Chief Complaint Document Details Patient Name: [name], [name]. Date of Service: 10/03/2016 1:15 PM Medical Record Number: [id number] Patient Account Number: [id number] Date of Birth/Sex: 1961/07/28 (56 y.o. 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 Date of Service: 10/03/2016 1:15 PM Medical Record Number: [id number] Patient Account Number: [id number] Date of Birth/Sex: Jul 12, 1961 (56 y.o. Female) Treating RN: Curtis Sites Primary Care Physician: Sandrea Hughs Other Clinician: Referring Physician: Sandrea Hughs Treating Physician/Extender: Rudene Re in Treatment: 4 History of Present Illness Location: right posterior calf Quality: Patient reports experiencing a sharp pain to affected area(s). Severity: Patient states wound are getting worse. Duration: Patient has had the wound for > 8 months prior to seeking treatment at the wound center Timing: Pain in wound is constant (hurts all the time) Context: The wound appeared gradually over time Modifying Factors: Other treatment(s) tried include:several courses of antibiotics have been tried including at the ER recently Associated Signs and Symptoms: Patient reports having increase swelling. HPI Description: 56 year old patient was  seen about a month ago in the ER with a chronic wound to her right posterior calf which she had for 3 months. after workup a CT was also done and this showed cellulitis without a discrete drainable abscess and no evidence of mild fasciitis or pyomyositis. There Was also no septic arthritis or osteomyelitis. She was given injectable clindamycin and discharged home on oral clindamycin at that stage. Past medical history of diabetes mellitus, anemia, CHF, COPD, peripheral vascular disease and sleep apnea. She is known to have a carotid artery obstruction. she is status post coronary angioplasty with stents, carotid endarterectomy in 2016 on the left side by Dr. dew, right lower extremity angiography by Dr. dew in August 2016 with intervention. On review of the notes from her 05/27/2015 procedure by Dr. dew showed that she had peripheral artery disease with claudication bilateral lower extremity right greater than left. At that stage he did a right lower extremity angiogram with percutaneous angioplasty of the proximal and mid SFA, distal SFA and above- knee popliteal artery and placement of a stent in the mid to distal SFA and proximal popliteal artery. She smokes and has been doing this for several years. 10/03/2016 -- on 09/22/2016 and she had a procedure done by Dr. Wyn Quaker with aortogram and selective right lower extremity angiogram and placement of a stent after an angioplasty of the superficial femoral artery. There was near occlusion of the stenosis in the previously placed SFA stent. The popliteal artery is less than 30% stenosis. There was a two-vessel runoff distally with an anterior tibial artery and peroneal artery without stenosis. Electronic Signature(s) Signed: 10/03/2016 1:59:08 PM By: Evlyn Kanner MD, FACS Previous Signature: 10/03/2016 1:57:59 PM Version By: Evlyn Kanner MD, FACS Previous Signature: 10/03/2016 1:50:39 PM Version By: Evlyn Kanner MD, FACS Entered By: Evlyn Kanner on  10/03/2016 13:59:08 MAKHAYLA, MCMURRY ([id number]) Bennison, Barrie Folk ([id number]) -------------------------------------------------------------------------------- Physical Exam Details Patient Name: [name], [name] Date of Service: 10/03/2016 1:15 PM  Medical Record Number: [id number] Patient Account Number: [id number] Date of Birth/Sex: 03/16/1961 (56 y.o. Female) Treating RN: Curtis Sitesorthy, Joanna Primary Care Physician: Sandrea HughsUBIO, JESSICA Other Clinician: Referring Physician: Sandrea HughsUBIO, JESSICA Treating Physician/Extender: Rudene ReBritto, Caroll Cunnington Weeks in Treatment: 4 Constitutional . Pulse regular. Respirations normal and unlabored. Afebrile. . Eyes Nonicteric. Reactive to light. Ears, Nose, Mouth, and Throat Lips, teeth, and gums WNL.Marland Kitchen. Moist mucosa without lesions. Neck supple and nontender. No palpable supraclavicular or cervical adenopathy. Normal sized without goiter. Respiratory WNL. No retractions.. Breath sounds WNL, No rubs, rales, rhonchi, or wheeze.. Cardiovascular Heart rhythm and rate regular, no murmur or gallop.. Pedal Pulses WNL. No clubbing, cyanosis or edema. Chest Breasts symmetical and no nipple discharge.. Breast tissue WNL, no masses, lumps, or tenderness.. Lymphatic No adneopathy. No adenopathy. No adenopathy. Musculoskeletal Adexa without tenderness or enlargement.. Digits and nails w/o clubbing, cyanosis, infection, petechiae, ischemia, or inflammatory conditions.. Integumentary (Hair, Skin) No suspicious lesions. No crepitus or fluctuance. No peri-wound warmth or erythema. No masses.Marland Kitchen. Psychiatric Judgement and insight Intact.. No evidence of depression, anxiety, or agitation.. Notes the right calf wound continues to have a lot of slough and this cannot be debrided today as it is very tender. Will continue with Santyl ointment locally Electronic Signature(s) Signed: 10/03/2016 2:00:00 PM By: Evlyn KannerBritto, Karston Hyland MD, FACS Entered By: Evlyn KannerBritto, Izmael Duross on 10/03/2016 13:59:58 Treasa SchoolSUGGS, Eiliana C.  ([id number]) -------------------------------------------------------------------------------- Physician Orders Details Patient Name: Cynthia HeySUGGS, Kenda C. Date of Service: 10/03/2016 1:15 PM Medical Record Number: [id number] Patient Account Number: [id number] Date of Birth/Sex: 05/04/1961 (56 y.o. 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 o Cleanse wound with mild soap and water Anesthetic Wound #1 Right,Posterior Lower Leg o Topical Lidocaine 4% cream applied to wound bed prior to debridement - for clinic use Skin Barriers/Peri-Wound Care Wound #1 Right,Posterior Lower Leg o Skin Prep Primary Wound Dressing Wound #1 Right,Posterior Lower Leg o Santyl Ointment Secondary Dressing Wound #1 Right,Posterior Lower Leg o Dry Gauze o Other - [place] Follow-up Appointments Wound #1 Right,Posterior Lower Leg o Return Appointment in 1 week. Treasa SchoolSUGGS, Loralee C. ([id number]) Edema Control Wound #1 Right,Posterior Lower Leg o Elevate legs to the level of the heart and pump ankles as often as possible Additional Orders / Instructions Wound #1 Right,Posterior Lower Leg o Stop Smoking o Increase protein intake. Medications-please add to medication list. Wound #1 Right,Posterior  Lower Leg o Santyl Enzymatic Ointment o Other: - Vitamin C, Zinc, Multivitamin Electronic Signature(s) Signed: 10/03/2016 2:35:57 PM By: Evlyn KannerBritto, Eriberto Felch MD, FACS Signed: 10/03/2016 2:43:43 PM By: Curtis Sitesorthy, Joanna Entered By: Curtis Sitesorthy, Joanna on 10/03/2016 14:08:27 Falwell, Barrie FolkVERNA C. ([id number]) -------------------------------------------------------------------------------- Problem List Details Patient Name: Brede, Delsy C. Date of Service: 10/03/2016 1:15 PM Medical Record Number: [id number] Patient Account Number: [id number] Date of Birth/Sex: 05/14/1961 (56 y.o. Female) Treating RN: Curtis Sitesorthy, Joanna Primary Care Physician: Sandrea HughsUBIO, JESSICA Other Clinician: Referring Physician: Sandrea HughsUBIO, JESSICA Treating Physician/Extender: Rudene ReBritto, Morganna Styles Weeks in Treatment: 4 Active Problems ICD-10 Encounter Code Description Active  Date Diagnosis E11.622 Type 2 diabetes mellitus with other skin ulcer 09/05/2016 Yes L97.212 Non-pressure chronic ulcer of right calf with fat layer 09/05/2016 Yes exposed I70.232 Atherosclerosis of native arteries of right leg with 09/05/2016 Yes ulceration of calf F17.218 Nicotine dependence, cigarettes, with other nicotine- 09/05/2016 Yes induced disorders E66.01 Morbid (severe) obesity due to excess calories 09/05/2016 Yes Inactive Problems Resolved Problems Electronic Signature(s) Signed: 10/03/2016 1:58:35 PM By: Evlyn Kanner MD, FACS Entered By: Evlyn Kanner on 10/03/2016 13:58:35 Pandya, Barrie Folk ([id number]) -------------------------------------------------------------------------------- Progress Note Details Patient Name: Carin Hock, Louiza C. Date of Service: 10/03/2016 1:15 PM Medical Record Number: [id number] Patient Account Number: [id number] Date of Birth/Sex: October 18, 1960 (56 y.o. 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 Severity: Patient states wound are getting worse. Duration: Patient has had the wound for > 8 months prior to seeking treatment at the wound center Timing: Pain in wound is constant (hurts all the time) Context: The wound appeared gradually over time Modifying Factors: Other treatment(s) tried include:several courses of antibiotics have been tried including at the ER recently Associated Signs and Symptoms: Patient reports having increase swelling. 56 year old patient was seen about a month ago in the ER with a chronic wound to her right posterior calf which she had for 3 months. after workup a CT was also done and this showed cellulitis without a discrete drainable abscess and no evidence of mild fasciitis or pyomyositis. There Was also no septic arthritis or osteomyelitis. She was given injectable clindamycin and discharged home on oral clindamycin at that stage. Past medical history of diabetes mellitus, anemia, CHF, COPD, peripheral vascular disease and sleep apnea. She is known to have a carotid artery obstruction. she is status post coronary angioplasty with stents, carotid endarterectomy in 2016 on the left side by Dr. dew, right lower extremity angiography by Dr. dew in August 2016 with intervention. On review of the notes from her 05/27/2015 procedure by Dr. dew showed that she had peripheral artery disease with claudication bilateral lower extremity right greater than left. At that stage he did a right lower extremity angiogram with percutaneous  angioplasty of the proximal and mid SFA, distal SFA and above- knee popliteal artery and placement of a stent in the mid to distal SFA and proximal popliteal artery. She smokes and has been doing this for several years. 10/03/2016 -- on 09/22/2016 and she had a procedure done by Dr. Wyn Quaker with aortogram and selective right lower extremity angiogram and placement of a stent after an angioplasty of the superficial femoral artery. There was near occlusion of the stenosis in the previously placed SFA stent. The popliteal artery is less than 30% stenosis. There was a two-vessel runoff distally with an anterior tibial artery and peroneal artery Marion, Lucielle C. ([id number]) without stenosis. Objective Constitutional Pulse regular. Respirations normal and unlabored. Afebrile. Vitals Time Taken: 1:35 PM, Height: 62 in, Weight: 276 lbs, BMI: 50.5, Temperature: 98.3 F, Pulse: 92 bpm, Respiratory Rate: 18 breaths/min, Blood Pressure: 118/61 mmHg. Eyes Nonicteric. Reactive to light. Ears, Nose, Mouth,  and Throat Lips, teeth, and gums WNL.Marland Kitchen Moist mucosa without lesions. Neck supple and nontender. No palpable supraclavicular or cervical adenopathy. Normal sized without goiter. Respiratory WNL. No retractions.. Breath sounds WNL, No rubs, rales, rhonchi, or wheeze.. Cardiovascular Heart rhythm and rate regular, no murmur or gallop.. Pedal Pulses WNL. No clubbing, cyanosis or edema. Chest Breasts symmetical and no nipple discharge.. Breast tissue WNL, no masses, lumps, or tenderness.. Lymphatic No adneopathy. No adenopathy. No adenopathy. Musculoskeletal Adexa without tenderness or enlargement.. Digits and nails w/o clubbing, cyanosis, infection, petechiae, ischemia, or inflammatory conditions.Marland Kitchen Psychiatric Judgement and insight Intact.. No evidence of depression, anxiety, or agitation.. General Notes: the right calf wound continues to have a lot of slough and this cannot be debrided today as it is  very tender. Will continue with Santyl ointment locally Integumentary (Hair, Skin) No suspicious lesions. No crepitus or fluctuance. No peri-wound warmth or erythema. No masses.Carin Hock, Barrie Folk ([id number]) Wound #1 status is Open. Original cause of wound was Gradually Appeared. The wound is located on the Right,Posterior Lower Leg. The wound measures 0.9cm length x 1.6cm width x 0.2cm depth; 1.131cm^2 area and 0.226cm^3 volume. The wound is limited to skin breakdown. There is no tunneling or undermining noted. There is a large amount of serous drainage noted. The wound margin is distinct with the outline attached to the wound base. There is no granulation within the wound bed. There is a large (67-100%) amount of necrotic tissue within the wound bed including Adherent Slough. The periwound skin appearance exhibited: Localized Edema, Moist, Erythema. The surrounding wound skin color is noted with erythema which is circumferential. Periwound temperature was noted as No Abnormality. The periwound has tenderness on palpation. 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 dew for a significant arterial stenosis of a right lower extremity. After review have recommended: 1. Santyl ointment to be applied to the wound with dressing changes daily. 2. review by her vascular surgeons as her follow-up appointment is pending 3. Good control of her diabetes mellitus with a recent A1c to be done 4. her the need to completely give up smoking and I'm pleased to learn that she is still off smoking 5.  regular visits the wound center Plan Wound Cleansing: Wound #1 Right,Posterior Lower Leg: Clean wound with Normal Saline. Cleanse wound with mild soap and water Anesthetic: Wound #1 Right,Posterior Lower Leg: Mancinas, Simar C. ([id number]) Topical Lidocaine 4% cream applied to wound bed prior to debridement - for clinic use Skin Barriers/Peri-Wound Care: Wound #1 Right,Posterior Lower Leg: Skin Prep Primary Wound Dressing: Wound #1 Right,Posterior Lower Leg: Santyl Ointment Secondary Dressing: Wound #1 Right,Posterior Lower Leg: Dry Gauze Other - [place] Follow-up Appointments: Wound #1 Right,Posterior Lower Leg: Return Appointment in 1 week. Edema Control: Wound #1 Right,Posterior Lower Leg: Elevate legs to the level of the heart and pump ankles as often as possible Additional Orders / Instructions: Wound #1 Right,Posterior Lower Leg: Stop Smoking Increase protein intake. Medications-please add to medication list.: Wound #1 Right,Posterior Lower Leg: Santyl Enzymatic Ointment Other: - Vitamin C, Zinc, Multivitamin Since I saw the patient last she has had a vascular  procedure done by Dr. dew for a significant arterial stenosis of a right lower extremity. After review have recommended: 1. Santyl ointment to be applied to the wound with dressing changes daily. 2. review by her vascular surgeons as her follow-up appointment is pending 3. Good control of her diabetes mellitus with a recent A1c to be done 4. her the need to completely give up smoking and I'm pleased to learn that she is still off smoking 5. regular visits the wound center Electronic Signature(s) Signed: 10/04/2016 8:22:33 AM By: Evlyn Kanner MD, FACS Previous Signature: 10/03/2016 2:01:36 PM Version By: Evlyn Kanner MD, FACS Entered By: Evlyn Kanner on 10/04/2016 08:22:33 Degen, Barrie Folk ([id number]) -------------------------------------------------------------------------------- SuperBill Details Patient  Name: Cynthia Hey C. Date of Service: 10/03/2016 Medical Record Number: [id number] Patient Account Number: [id number] Date of Birth/Sex: 1960/10/31 (56 y.o. 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 F17.218 Nicotine dependence, cigarettes, with other nicotine-induced disorders E66.01 Morbid (severe) obesity due to excess calories Facility Procedures CPT4 Code: [id number] Description: 99213 - WOUND CARE VISIT-LEV 3 EST PT Modifier: Quantity: 1 Physician Procedures CPT4 Code Description: [id number] 99213 - WC PHYS LEVEL 3 - EST PT ICD-10 Description Diagnosis E11.622 Type 2 diabetes mellitus with other skin ulcer 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

## 2016-10-04 ENCOUNTER — Emergency Department
Admission: EM | Admit: 2016-10-04 | Discharge: 2016-10-04 | Disposition: A | Discharge status: Home / Self Care | Payer: Medicaid Other | Attending: Emergency Medicine | Admitting: Emergency Medicine

## 2016-10-04 DIAGNOSIS — F17290 Nicotine dependence, other tobacco product, uncomplicated: Secondary | ICD-10-CM | POA: Diagnosis not present

## 2016-10-04 DIAGNOSIS — Z955 Presence of coronary angioplasty implant and graft: Secondary | ICD-10-CM | POA: Insufficient documentation

## 2016-10-04 DIAGNOSIS — M7989 Other specified soft tissue disorders: Secondary | ICD-10-CM | POA: Diagnosis not present

## 2016-10-04 DIAGNOSIS — Z794 Long term (current) use of insulin: Secondary | ICD-10-CM | POA: Diagnosis not present

## 2016-10-04 DIAGNOSIS — F17210 Nicotine dependence, cigarettes, uncomplicated: Secondary | ICD-10-CM | POA: Diagnosis not present

## 2016-10-04 DIAGNOSIS — Z5321 Procedure and treatment not carried out due to patient leaving prior to being seen by health care provider: Secondary | ICD-10-CM | POA: Insufficient documentation

## 2016-10-04 DIAGNOSIS — Z7982 Long term (current) use of aspirin: Secondary | ICD-10-CM | POA: Insufficient documentation

## 2016-10-04 DIAGNOSIS — Z4800 Encounter for change or removal of nonsurgical wound dressing: Secondary | ICD-10-CM | POA: Diagnosis not present

## 2016-10-04 DIAGNOSIS — Z79899 Other long term (current) drug therapy: Secondary | ICD-10-CM | POA: Insufficient documentation

## 2016-10-04 DIAGNOSIS — I2510 Atherosclerotic heart disease of native coronary artery without angina pectoris: Secondary | ICD-10-CM | POA: Diagnosis not present

## 2016-10-04 DIAGNOSIS — I110 Hypertensive heart disease with heart failure: Secondary | ICD-10-CM | POA: Diagnosis not present

## 2016-10-04 DIAGNOSIS — E119 Type 2 diabetes mellitus without complications: Secondary | ICD-10-CM | POA: Insufficient documentation

## 2016-10-04 DIAGNOSIS — I509 Heart failure, unspecified: Secondary | ICD-10-CM | POA: Insufficient documentation

## 2016-10-04 DIAGNOSIS — J449 Chronic obstructive pulmonary disease, unspecified: Secondary | ICD-10-CM | POA: Diagnosis not present

## 2016-10-04 LAB — GLUCOSE, CAPILLARY: Glucose-Capillary: 241 mg/dL — ABNORMAL HIGH (ref 65–99)

## 2016-10-04 NOTE — ED Triage Notes (Signed)
Patient presents to the ED with pain and swelling to her big toe of her right foot.  Patient [facility] and patient states she missed her follow up appointment.  Patient has diabetes and goes to the wound clinic for a wound to the back of her right lower leg.  Patient is in no obvious distress at this time.  Patient states she doesn't check her blood sugar every day.

## 2016-10-10 ENCOUNTER — Encounter: Payer: Medicaid Other | Admitting: Surgery

## 2016-10-10 DIAGNOSIS — E11622 Type 2 diabetes mellitus with other skin ulcer: Secondary | ICD-10-CM | POA: Diagnosis not present

## 2016-10-10 NOTE — Progress Notes (Signed)
LOUAN, BASE ([id number]) Visit Report for 10/10/2016 Arrival Information Details Patient Name: [name], [name]. Date of Service: 10/10/2016 11:00 AM Medical Record Number: [id number] Patient Account Number: [id number] Date of Birth/Sex: Jun 25, 1961 (56 y.o. Female) Treating RN: Montey Hora Primary Care Physician: Freddy Finner Other Clinician: Referring Physician: Freddy Finner Treating Physician/Extender: Frann Rider in Treatment: 5 Visit Information History Since Last Visit Added or deleted any medications: No Patient Arrived: Wheel Chair Any new allergies or adverse reactions: No Arrival Time: 11:22 Had a fall or experienced change in No Accompanied By: dtr activities of daily living that may affect Transfer Assistance: None risk of falls: Patient Identification Verified: Yes Signs or symptoms of abuse/neglect since last No Secondary Verification Process Yes visito Completed: Hospitalized since last visit: No Patient Requires Transmission- No Pain Present Now: Yes Based Precautions: Patient Has Alerts: Yes Patient Alerts: Patient on Blood Thinner DM II Plavix Electronic Signature(s) Signed: 10/10/2016 3:52:38 PM By: Montey Hora Entered By: Montey Hora on 10/10/2016 11:23:02 Titus, [name] ([id number]) -------------------------------------------------------------------------------- Clinic Level of Care Assessment Details Patient Name: Cynthia Aquas C. Date of Service: 10/10/2016 11:00 AM Medical Record Number: [id number] Patient Account Number: [id number] Date of Birth/Sex: 07/23/1961 (56 y.o. Female) Treating RN: Montey Hora Primary Care Physician: Freddy Finner Other Clinician: Referring Physician: Freddy Finner Treating Physician/Extender: Frann Rider in Treatment: 5 Clinic Level of Care Assessment Items TOOL 4 Quantity Score []  - Use when only an EandM is performed on FOLLOW-UP visit 0 ASSESSMENTS - Nursing Assessment /  Reassessment X - Reassessment of Co-morbidities (includes updates in patient status) 1 10 X - Reassessment of Adherence to Treatment Plan 1 5 ASSESSMENTS - Wound and Skin Assessment / Reassessment []  - Simple Wound Assessment / Reassessment - one wound 0 X - Complex Wound Assessment / Reassessment - multiple wounds 2 5 []  - Dermatologic / Skin Assessment (not related to wound area) 0 ASSESSMENTS - Focused Assessment []  - Circumferential Edema Measurements - multi extremities 0 []  - Nutritional Assessment / Counseling / Intervention 0 X - Lower Extremity Assessment (monofilament, tuning fork, pulses) 1 5 []  - Peripheral Arterial Disease Assessment (using hand held doppler) 0 ASSESSMENTS - Ostomy and/or Continence Assessment and Care []  - Incontinence Assessment and Management 0 []  - Ostomy Care Assessment and Management (repouching, etc.) 0 PROCESS - Coordination of Care X - Simple Patient / Family Education for ongoing care 1 15 []  - Complex (extensive) Patient / Family Education for ongoing care 0 []  - Staff obtains Programmer, systems, Records, Test Results / Process Orders 0 []  - Staff telephones HHA, Nursing Homes / Clarify orders / etc 0 []  - Routine Transfer to another Facility (non-emergent condition) 0 Stiger, Cynthia C. ([id number]) []  - Routine Hospital Admission (non-emergent condition) 0 []  - New Admissions / Biomedical engineer / Ordering NPWT, Apligraf, etc. 0 []  - Emergency Hospital Admission (emergent condition) 0 X - Simple Discharge Coordination 1 10 []  - Complex (extensive) Discharge Coordination 0 PROCESS - Special Needs []  - Pediatric / Minor Patient Management 0 []  - Isolation Patient Management 0 []  - Hearing / Language / Visual special needs 0 []  - Assessment of Community assistance (transportation, D/C planning, etc.) 0 []  - Additional assistance / Altered mentation 0 []  - Support Surface(s) Assessment (bed, cushion, seat, etc.) 0 INTERVENTIONS - Wound Cleansing /  Measurement []  - Simple Wound Cleansing - one wound 0 X - Complex Wound Cleansing - multiple wounds 2 5 X - Wound Imaging (photographs - any number of wounds)  1 5 []  - Wound Tracing (instead of photographs) 0 []  - Simple Wound Measurement - one wound 0 X - Complex Wound Measurement - multiple wounds 2 5 INTERVENTIONS - Wound Dressings X - Small Wound Dressing one or multiple wounds 2 10 []  - Medium Wound Dressing one or multiple wounds 0 []  - Large Wound Dressing one or multiple wounds 0 []  - Application of Medications - topical 0 []  - Application of Medications - injection 0 INTERVENTIONS - Miscellaneous []  - External ear exam 0 Armistead, Tomi C. ([id number]) []  - Specimen Collection (cultures, biopsies, blood, body fluids, etc.) 0 []  - Specimen(s) / Culture(s) sent or taken to Lab for analysis 0 []  - Patient Transfer (multiple staff / Harrel Lemon Lift / Similar devices) 0 []  - Simple Staple / Suture removal (25 or less) 0 []  - Complex Staple / Suture removal (26 or more) 0 []  - Hypo / Hyperglycemic Management (close monitor of Blood Glucose) 0 []  - Ankle / Brachial Index (ABI) - do not check if billed separately 0 X - Vital Signs 1 5 Has the patient been seen at the hospital within the last three years: Yes Total Score: 105 Level Of Care: New/Established - Level 3 Electronic Signature(s) Signed: 10/10/2016 3:52:38 PM By: Montey Hora Entered By: Montey Hora on 10/10/2016 11:39:51 Rhinehart, [name] ([id number]) -------------------------------------------------------------------------------- Encounter Discharge Information Details Patient Name: Cynthia Aquas C. Date of Service: 10/10/2016 11:00 AM Medical Record Number: [id number] Patient Account Number: [id number] Date of Birth/Sex: 10/30/1960 (55 y.o. Female) Treating RN: Montey Hora Primary Care Physician: Freddy Finner Other Clinician: Referring Physician: Freddy Finner Treating Physician/Extender: Frann Rider in  Treatment: 5 Encounter Discharge Information Items Discharge Pain Level: 0 Discharge Condition: Stable Ambulatory Status: Wheelchair Discharge Destination: Home Transportation: Private Auto Accompanied By: dtr Schedule Follow-up Appointment: Yes Medication Reconciliation completed No and provided to Patient/Care Elohim Brune: Patient Clinical Summary of Care: Declined Electronic Signature(s) Signed: 10/10/2016 11:51:44 AM By: Sharon Mt Entered By: Sharon Mt on 10/10/2016 11:51:44 Bertram, [name] ([id number]) -------------------------------------------------------------------------------- Lower Extremity Assessment Details Patient Name: Ciliberto, Cynthia C. Date of Service: 10/10/2016 11:00 AM Medical Record Number: [id number] Patient Account Number: [id number] Date of Birth/Sex: 1961-01-16 (56 y.o. Female) Treating RN: Montey Hora Primary Care Physician: Freddy Finner Other Clinician: Referring Physician: Freddy Finner Treating Physician/Extender: Frann Rider in Treatment: 5 Edema Assessment Assessed: [Left: No] [Right: No] Edema: [Left: Ye] [Right: s] Vascular Assessment Pulses: Dorsalis Pedis Palpable: [Right:Yes] Posterior Tibial Extremity colors, hair growth, and conditions: Extremity Color: [Right:Normal] Hair Growth on Extremity: [Right:No] Temperature of Extremity: [Right:Warm] Capillary Refill: [Right:< 3 seconds] Electronic Signature(s) Signed: 10/10/2016 3:52:38 PM By: Montey Hora Entered By: Montey Hora on 10/10/2016 11:36:45 Potts, [name] Kitchen ([id number]) -------------------------------------------------------------------------------- Multi Wound Chart Details Patient Name: Cynthia Aquas C. Date of Service: 10/10/2016 11:00 AM Medical Record Number: [id number] Patient Account Number: [id number] Date of Birth/Sex: 10-Aug-1961 (56 y.o. 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 Comorbid History: Anemia, Chronic Anemia, Chronic N/A Obstructive Pulmonary Obstructive Pulmonary Disease (COPD), Sleep Disease (COPD), Sleep Apnea, Congestive Heart Apnea, Congestive Heart Failure, Coronary  Artery Failure, Coronary Artery Disease, Hypertension, Disease, Hypertension, Peripheral Venous Peripheral Venous Disease, Type II Diabetes, Disease, Type II Diabetes, Osteoarthritis, Neuropathy Osteoarthritis, Neuropathy Date Acquired: 01/05/2016 10/05/2016 N/A Weeks of Treatment: 5 0 N/A Wound Status: Open Open N/A Measurements L x W x D 0.9x2.2x0.2 1.1x0.3x0.1 N/A (cm) Area (cm) : 1.555 0.259 N/A Volume (cm) : 0.311 0.026 N/A % Reduction in Area: -46.70% 0.00% N/A % Reduction in Volume: -193.40% 0.00% N/A Sipe, Cynthia C. ([id number]) Classification: Grade 1 Full Thickness Without N/A Exposed Support Structures HBO Classification: N/A Grade 1 N/A Exudate Amount: Large Large N/A Exudate Type: Serous Serous N/A Exudate Color: amber amber N/A Wound Margin: Distinct, outline attached Flat and Intact N/A Granulation Amount: None Present (0%) None Present (0%) N/A Necrotic Amount: Large (67-100%) Large (67-100%) N/A Necrotic Tissue: Adherent Slough Eschar, Adherent Slough N/A Exposed Structures: Fascia: No Fascia: No N/A Fat: No Fat: No Tendon: No Tendon: No Muscle: No Muscle: No Joint: No Joint: No Bone: No Bone: No Limited to Skin Limited to Skin Breakdown  Breakdown Epithelialization: None None N/A Periwound Skin Texture: Edema: Yes Edema: No N/A Excoriation: No Induration: No Callus: No Crepitus: No Fluctuance: No Friable: No Rash: No Scarring: No Periwound Skin Moist: Yes Moist: Yes N/A Moisture: Maceration: No Dry/Scaly: No Periwound Skin Color: Erythema: Yes Atrophie Blanche: No N/A Cyanosis: No Ecchymosis: No Erythema: No Hemosiderin Staining: No Mottled: No Pallor: No Rubor: No Erythema Location: Circumferential N/A N/A Temperature: No Abnormality No Abnormality N/A Tenderness on Yes Yes N/A Palpation: Wound Preparation: Ulcer Cleansing: Ulcer Cleansing: N/A Rinsed/Irrigated with Rinsed/Irrigated with Saline Saline Topical Anesthetic Topical Anesthetic Korpela, Cynthia C. ([id number]) Applied: Other: lidocaine Applied: Other: lidocaine 4% 4% Treatment Notes Wound #1 (Right, Posterior Lower Leg) 1. Cleansed with: Clean wound with Normal Saline 2. Anesthetic Topical Lidocaine 4% cream to wound bed prior to debridement 4. Dressing Applied: Santyl Ointment 5. Secondary [place] Wound #2 (Right, Distal, Posterior Lower Leg) 1. Cleansed with: Clean wound with Normal Saline 2. Anesthetic Topical Lidocaine 4% cream to wound bed prior to debridement 4. Dressing Applied: Santyl Ointment 5. Secondary Dressing Applied Dry [place] Signature(s) Signed: 10/10/2016 11:43:53 AM By: Christin Fudge MD, FACS Entered By: Christin Fudge on 10/10/2016 11:43:53 [name] ([id number]) -------------------------------------------------------------------------------- [place] Details Patient Name: [name]. Date of Service: 10/10/2016 11:00 AM Medical Record Number: [id number] Patient Account Number: [id number] Date of Birth/Sex: June 14, 1961 (56 y.o. Female) Treating RN: Montey Hora Primary Care Physician: Freddy Finner Other Clinician: Referring  Physician: Freddy Finner Treating Physician/Extender: Frann Rider in Treatment: 5 Active Inactive Abuse / Safety / Falls / Self Care Management Nursing Diagnoses: Potential for falls Goals: Patient will remain injury free Date Initiated: 09/05/2016 Goal Status: Active Interventions: Assess fall risk on admission and as needed Assess impairment of mobility on admission and as needed per policy Notes: Nutrition Nursing Diagnoses: Imbalanced nutrition Impaired glucose control: actual or potential Goals: Patient/caregiver agrees to and verbalizes understanding of need to use nutritional supplements and/or vitamins as prescribed Date Initiated: 09/05/2016 Goal Status: Active Patient/caregiver verbalizes understanding of need to maintain therapeutic glucose control per primary care physician Date Initiated: 09/05/2016 Goal Status: Active Patient/caregiver will maintain therapeutic glucose control Date Initiated: 09/05/2016 Goal Status: Active Interventions: Assess patient nutrition upon admission and as needed per policy Passage, Cynthia C. ([id number]) Provide education on elevated blood sugars and impact on wound healing Provide education on nutrition Treatment Activities: Education provided on Nutrition :  09/05/2016 Notes: Orientation to the Wound Care Program Nursing Diagnoses: Knowledge deficit related to the wound healing center program Goals: Patient/caregiver will verbalize understanding of the [facility] Program Date Initiated: 09/05/2016 Goal Status: Active Interventions: Provide education on orientation to the wound center Notes: Pain, Acute or Chronic Nursing Diagnoses: Pain, acute or chronic: actual or potential Potential alteration in comfort, pain Goals: Patient will verbalize adequate pain control and receive pain control interventions during procedures as needed Date Initiated: 09/05/2016 Goal Status: Active Patient/caregiver will  verbalize adequate pain control between visits Date Initiated: 09/05/2016 Goal Status: Active Patient/caregiver will verbalize comfort level met Date Initiated: 09/05/2016 Goal Status: Active Interventions: Assess comfort goal upon admission Complete pain assessment as per visit requirements Notes: [name], [name] ([id number]) Wound/Skin Impairment Nursing Diagnoses: Impaired tissue integrity Knowledge deficit related to smoking impact on wound healing Knowledge deficit related to ulceration/compromised skin integrity Goals: Ulcer/skin breakdown will have a volume reduction of 30% by week 4 Date Initiated: 09/05/2016 Goal Status: Active Ulcer/skin breakdown will have a volume reduction of 50% by week 8 Date Initiated: 09/05/2016 Goal Status: Active Ulcer/skin breakdown will have a volume reduction of 80% by week 12 Date Initiated: 09/05/2016 Goal Status: Active Interventions: Assess patient/caregiver ability to perform ulcer/skin care regimen upon admission and as needed Assess ulceration(s) every visit Provide education on smoking Notes: Electronic Signature(s) Signed: 10/10/2016 3:52:38 PM By: Montey Hora Entered By: Montey Hora on 10/10/2016 11:36:51 [place], [name] Kitchen ([id number]) -------------------------------------------------------------------------------- Pain Assessment Details Patient Name: Cynthia Aquas C. Date of Service: 10/10/2016 11:00 AM Medical Record Number: [id number] Patient Account Number: [id number] Date of Birth/Sex: 06/05/1961 (56 y.o. Female) Treating RN: Montey Hora Primary Care Physician: Freddy Finner Other Clinician: Referring Physician: Freddy Finner Treating Physician/Extender: Frann Rider in Treatment: 5 Active Problems Location of Pain Severity and Description of Pain Patient Has Paino Yes Site Locations Pain Location: Pain in Ulcers With Dressing Change: Yes Duration of the Pain. Constant / Intermittento Constant Pain  Management and Medication Current Pain Management: Notes Topical or injectable lidocaine is offered to patient for acute pain when surgical debridement is performed. If needed, Patient is instructed to use over the counter pain medication for the following 24-48 hours after debridement. Wound care MDs do not prescribed pain medications. Patient has chronic pain or uncontrolled pain. Patient has been instructed to make an appointment with their Primary Care Physician for pain management. Electronic Signature(s) Signed: 10/10/2016 3:52:38 PM By: Montey Hora Entered By: Montey Hora on 10/10/2016 11:23:17 Sem, [name] ([id number]) -------------------------------------------------------------------------------- Patient/Caregiver Education Details Patient Name: [name] Date of Service: 10/10/2016 11:00 AM Medical Record Number: [id number] Patient Account Number: [id number] Date of Birth/Gender: 1961/04/03 (56 y.o. Female) Treating RN: Montey Hora Primary Care Physician: Freddy Finner Other Clinician: Referring Physician: Freddy Finner Treating Physician/Extender: Frann Rider in Treatment: 5 Education Assessment Education Provided To: Patient and Caregiver Education Topics Provided Wound/Skin Impairment: Handouts: Other: wound care as ordered Methods: Demonstration, Explain/Verbal Responses: State content correctly Electronic Signature(s) Signed: 10/10/2016 3:52:38 PM By: Montey Hora Entered By: Montey Hora on 10/10/2016 11:40:32 Karpf, Myishia Gray Kitchen ([id number]) -------------------------------------------------------------------------------- Wound Assessment Details Patient Name: [name], Ashtyn C. Date of Service: 10/10/2016 11:00 AM Medical Record Number: [id number] Patient Account Number: [id number] Date of Birth/Sex: 1961/01/11 (56 y.o. Female) Treating RN: Montey Hora Primary Care Physician: Freddy Finner Other Clinician: Referring Physician: Freddy Finner Treating Physician/Extender: Frann Rider in Treatment: 5 Wound Status Wound Number: 1 Primary Diabetic Wound/Ulcer of the Lower Etiology: Extremity Wound  Location: Right Lower Leg - Posterior Wound Open Wounding Event: Gradually Appeared Status: Date Acquired: 01/05/2016 Comorbid Anemia, Chronic Obstructive Pulmonary Weeks Of Treatment: 5 History: Disease (COPD), Sleep Apnea, Clustered Wound: No Congestive Heart Failure, Coronary Artery Disease, Hypertension, Peripheral Venous Disease, Type II Diabetes, Osteoarthritis, Neuropathy Photos Wound Measurements Length: (cm) 0.9 Width: (cm) 2.2 Depth: (cm) 0.2 Area: (cm) 1.555 Volume: (cm) 0.311 % Reduction in Area: -46.7% % Reduction in Volume: -193.4% Epithelialization: None Tunneling: No Undermining: No Wound Description Classification: Grade 1 Foul Odor Aft Wound Margin: Distinct, outline attached Exudate Amount: Large Exudate Type: Serous Exudate Color: amber er Cleansing: No Wound Bed Granulation Amount: None Present (0%) Exposed Structure Necrotic Amount: Large (67-100%) Fascia Exposed: No Syverson, Cynthia C. ([id number]) Necrotic Quality: Adherent Slough Fat Layer Exposed: No Tendon Exposed: No Muscle Exposed: No Joint Exposed: No Bone Exposed: No Limited to Skin Breakdown Periwound Skin Texture Texture Color No Abnormalities Noted: No No Abnormalities Noted: No Localized Edema: Yes Erythema: Yes Erythema Location: Circumferential Moisture No Abnormalities Noted: No Temperature / Pain Moist: Yes Temperature: No Abnormality Tenderness on Palpation: Yes Wound Preparation Ulcer Cleansing: Rinsed/Irrigated with Saline Topical Anesthetic Applied: Other: lidocaine 4%, Treatment Notes Wound #1 (Right, Posterior Lower Leg) 1. Cleansed with: Clean wound with Normal Saline 2. Anesthetic Topical Lidocaine 4% cream to wound bed prior to debridement 4. Dressing Applied: Santyl Ointment 5.  Secondary Dressing Applied Dry [place] Signature(s) Signed: 10/10/2016 3:52:38 PM By: Montey Hora Entered By: Montey Hora on 10/10/2016 11:35:42 Ezra, [name] ([id number]) -------------------------------------------------------------------------------- Wound Assessment Details Patient Name: [name], Cynthia C. Date of Service: 10/10/2016 11:00 AM Medical Record Number: [id number] Patient Account Number: [id number] Date of Birth/Sex: 10-02-1960 (56 y.o. Female) Treating RN: Montey Hora Primary Care Physician: Freddy Finner Other Clinician: Referring Physician: Freddy Finner Treating Physician/Extender: Frann Rider in Treatment: 5 Wound Status Wound Number: 2 Primary Venous Leg Ulcer Etiology: Wound Location: Right Lower Leg - Posterior, Distal Wound Open Status: Wounding Event: Trauma Comorbid Anemia, Chronic Obstructive Pulmonary Date Acquired: 10/05/2016 History: Disease (COPD), Sleep Apnea, Weeks Of Treatment: 0 Congestive Heart Failure, Coronary Clustered Wound: No Artery Disease, Hypertension, Peripheral Venous Disease, Type II Diabetes, Osteoarthritis, Neuropathy Photos Wound Measurements Length: (cm) 1.1 Width: (cm) 0.3 Depth: (cm) 0.1 Area: (cm) 0.259 Volume: (cm) 0.026 % Reduction in Area: 0% % Reduction in Volume: 0% Epithelialization: None Tunneling: No Undermining: No Wound Description Full Thickness Without Classification: Exposed Support Structures Diabetic Severity Grade 1 ([place]): Wound Margin: Flat and Intact Exudate Amount: Large Exudate Type: Serous Exudate Color: amber Cornette, Cynthia C. ([id number]) Foul Odor After Cleansing: No Wound Bed Granulation Amount: None Present (0%) Exposed Structure Necrotic Amount: Large (67-100%) Fascia Exposed: No Necrotic Quality: Eschar, Adherent Slough Fat Layer Exposed: No Tendon Exposed: No Muscle Exposed: No Joint Exposed: No Bone Exposed: No Limited to Skin  Breakdown Periwound Skin Texture Texture Color No Abnormalities Noted: No No Abnormalities Noted: No Callus: No Atrophie Blanche: No Crepitus: No Cyanosis: No Excoriation: No Ecchymosis: No Fluctuance: No Erythema: No Friable: No Hemosiderin Staining: No Induration: No Mottled: No Localized Edema: No Pallor: No Rash: No Rubor: No Scarring: No Temperature / Pain Moisture Temperature: No Abnormality No Abnormalities Noted: No Tenderness on Palpation: Yes Dry / Scaly: No Maceration: No Moist: Yes Wound Preparation Ulcer Cleansing: Rinsed/Irrigated with Saline Topical Anesthetic Applied: Other: lidocaine 4%, Treatment Notes Wound #2 (Right, Distal, Posterior Lower Leg) 1. Cleansed with: Clean wound with Normal Saline 2. Anesthetic Topical Lidocaine 4% cream to wound  bed prior to debridement 4. Dressing Applied: Santyl Ointment 5. Secondary Dressing Applied Dry [place] Signature(s) Signed: 10/10/2016 3:52:38 PM By: Benjamine Sprague, [name] ([id number]) Entered By: Montey Hora on 10/10/2016 11:36:05 Hosek, [name] ([id number]) -------------------------------------------------------------------------------- Vitals Details Patient Name: Cynthia Aquas C. Date of Service: 10/10/2016 11:00 AM Medical Record Number: [id number] Patient Account Number: [id number] Date of Birth/Sex: 1960/11/07 (56 y.o. Female) Treating RN: Montey Hora Primary Care Physician: Freddy Finner Other Clinician: Referring Physician: Freddy Finner Treating Physician/Extender: Frann Rider in Treatment: 5 Vital Signs Time Taken: 11:23 Temperature (F): 98.1 Height (in): 62 Pulse (bpm): 81 Weight (lbs): 276 Respiratory Rate (breaths/min): 18 Body Mass Index (BMI): 50.5 Blood Pressure (mmHg): 125/71 Reference Range: 80 - 120 mg / dl Electronic Signature(s) Signed: 10/10/2016 3:52:38 PM By: Montey Hora Entered By: Montey Hora on 10/10/2016  11:25:27

## 2016-10-10 NOTE — Progress Notes (Signed)
[name], Cynthia C. ([id number]) Visit Report for 10/10/2016 Chief Complaint Document Details Patient Name: [name], Cynthia C. Date of Service: 10/10/2016 11:00 AM Medical Record Number: [id number] Patient Account Number: [id number] Date of Birth/Sex: 11/19/1960 (56 y.o. 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 ([id number]) -------------------------------------------------------------------------------- HPI Details Patient Name: Cynthia HeySUGGS, Ashea C. Date of Service: 10/10/2016 11:00 AM Medical Record Number: [id number] Patient Account Number: [id number] Date of Birth/Sex: 05/04/1961 (56 y.o. Female) Treating RN: Curtis Sitesorthy, Joanna Primary Care Physician: Sandrea HughsUBIO, JESSICA Other Clinician: Referring Physician: Sandrea HughsUBIO, JESSICA Treating Physician/Extender: Rudene ReBritto, Randa Riss Weeks in Treatment: 5 History of Present Illness Location: right posterior calf Quality: Patient reports experiencing a sharp pain to affected area(s). Severity: Patient states wound are getting worse. Duration: Patient has had the wound for > 8 months prior to seeking treatment at the wound center Timing: Pain in wound is constant (hurts all the time) Context: The wound appeared gradually over time Modifying Factors: Other treatment(s) tried include:several courses of antibiotics have been tried including at the ER recently Associated Signs and Symptoms: Patient reports having increase swelling. HPI Description: 56 year old  patient was seen about a month ago in the ER with a chronic wound to her right posterior calf which she had for 3 months. after workup a CT was also done and this showed cellulitis without a discrete drainable abscess and no evidence of mild fasciitis or pyomyositis. There Was also no septic arthritis or osteomyelitis. She was given injectable clindamycin and discharged home on oral clindamycin at that stage. Past medical history of diabetes mellitus, anemia, CHF, COPD, peripheral vascular disease and sleep apnea. She is known to have a carotid artery obstruction. she is status post coronary angioplasty with stents, carotid endarterectomy in 2016 on the left side by Dr. dew, right lower extremity angiography by Dr. dew in August 2016 with intervention. On review of the notes from her 05/27/2015 procedure by Dr. dew showed that she had peripheral artery disease with claudication bilateral lower extremity right greater than left. At that stage he did a right lower extremity angiogram with percutaneous angioplasty of the proximal and mid SFA, distal SFA and above- knee popliteal artery and placement of a stent in the mid to distal SFA and proximal popliteal artery. She smokes and has been doing this for several years. 10/03/2016 -- on 09/22/2016 and she had a procedure done by Dr. Wyn Quakerew with aortogram and selective right lower extremity angiogram and placement of a stent after an angioplasty of the superficial femoral artery. There was near occlusion of the stenosis in the previously placed SFA stent. The popliteal artery is less than 30% stenosis. There was a two-vessel runoff distally with an anterior tibial artery and peroneal artery without stenosis. 10/10/2016 -- the patient was [facility], on 10/04/2016, for a right toe pain and was treated for cellulitis of the right big toe. X-ray done was normal and was asked to see the podiatrist and the patient was  discharged home on Augmentin. x-ray of the right toes -- FINDINGS: Marginal osteophytosis noted at the IP and  DIP joints. Joint spaces are preserved. No acute fracture or dislocation. No radiopaque foreign body. No erosive change, evidence of soft tissue defect, or dissecting subcutaneous gas. The patient was also seen by Dr. Gwyneth Revels on 10/06/2016 [name] ([id number]) Electronic Signature(s) Signed: 10/10/2016 11:48:29 AM By: Evlyn Kanner MD, FACS Entered By: Evlyn Kanner on 10/10/2016 11:48:28 [name] ([id number]) -------------------------------------------------------------------------------- Physical Exam Details Patient Name: [name], Cynthia C. Date of Service: 10/10/2016 11:00 AM Medical Record Number: [id number] Patient Account Number: [id number] Date of Birth/Sex: 11-17-1960 (56 y.o. Female) Treating RN: Curtis Sites Primary Care Physician: Sandrea Hughs Other Clinician: Referring Physician: Sandrea Hughs Treating Physician/Extender: Rudene Gray in Treatment: 5 Constitutional . Pulse regular. Respirations normal and unlabored. Afebrile. . Eyes Nonicteric. Reactive to light. Ears, Nose, Mouth, and Throat Lips, teeth, and gums WNL.Marland Kitchen Moist mucosa without lesions. Neck supple and nontender. No palpable supraclavicular or cervical adenopathy. Normal sized without goiter. Respiratory WNL. No retractions.. Breath sounds WNL, No rubs, rales, rhonchi, or wheeze.. Cardiovascular Heart rhythm and rate regular, no murmur or gallop.. Pedal Pulses WNL. No clubbing, cyanosis or edema. Chest Breasts symmetical and no nipple discharge.. Breast tissue WNL, no masses, lumps, or tenderness.. Lymphatic No adneopathy. No adenopathy. No adenopathy. Musculoskeletal Adexa without tenderness or enlargement.. Digits and nails w/o clubbing, cyanosis, infection, petechiae, ischemia, or inflammatory conditions.. Integumentary (Hair, Skin) No suspicious lesions. No  crepitus or fluctuance. No peri-wound warmth or erythema. No masses.Marland Kitchen Psychiatric Judgement and insight Intact.. No evidence of depression, anxiety, or agitation.. Notes The right calf wound has slough but she is extremely tender and I cannot debrided sharply. We will continue to use Santyl ointment locally. The right big toe does not have any open ulcerations and there is mild swelling which could be in keeping with gout Electronic Signature(s) Signed: 10/10/2016 11:49:16 AM By: Evlyn Kanner MD, FACS Entered By: Evlyn Kanner on 10/10/2016 11:49:15 [name], [name] ([id number]) -------------------------------------------------------------------------------- Physician Orders Details Patient Name: Cynthia Hey C. Date of Service: 10/10/2016 11:00 AM Medical Record Number: [id number] Patient Account Number: [id number] Date of Birth/Sex: [birth year] (56 y.o. Female) Treating RN: Curtis Sites Primary Care Physician: Sandrea Hughs Other Clinician: Referring Physician: Sandrea Hughs Treating Physician/Extender: Rudene Gray in Treatment: 5 Verbal / Phone Orders: Yes Clinician: Curtis Sites Read Back and Verified: Yes Diagnosis Coding Wound Cleansing Wound #1 Right,Posterior Lower Leg o Clean wound with Normal Saline. o Cleanse wound with mild soap and water Wound #2 Right,Distal,Posterior Lower Leg o Clean wound with Normal Saline. o Cleanse wound with mild soap and water Anesthetic Wound #1 Right,Posterior Lower Leg o Topical Lidocaine 4% cream applied to wound bed prior to debridement - for clinic use Wound #2 Right,Distal,Posterior Lower Leg o Topical Lidocaine 4% cream applied to wound bed prior to debridement - for clinic use Skin Barriers/Peri-Wound Care Wound #1 Right,Posterior Lower Leg o Skin Prep Wound #2 Right,Distal,Posterior Lower Leg o Skin Prep Primary Wound Dressing Wound #1 Right,Posterior Lower Leg o Santyl Ointment Wound #2  Right,Distal,Posterior Lower Leg o Santyl Ointment Secondary Dressing Wound #1 Right,Posterior Lower Leg o Dry Gauze o Other - telfa island Wound #2 Right,Distal,Posterior Lower Leg Hand, Katheleen C. ([id number]) o Dry Gauze o Other - telfa island Dressing Change Frequency Wound #1 Right,Posterior Lower Leg o Change dressing every day. Wound #2 Right,Distal,Posterior Lower Leg o Change dressing every day. Follow-up Appointments Wound #1 Right,Posterior Lower Leg o Return Appointment in 1 week. Wound #2 Right,Distal,Posterior Lower Leg o Return Appointment in 1 week. Edema  Control Wound #1 Right,Posterior Lower Leg o Elevate legs to the level of the heart and pump ankles as often as possible Wound #2 Right,Distal,Posterior Lower Leg o Elevate legs to the level of the heart and pump ankles as often as possible Additional Orders / Instructions Wound #1 Right,Posterior Lower Leg o Stop Smoking o Increase protein intake. Wound #2 Right,Distal,Posterior Lower Leg o Stop Smoking o Increase protein intake. Medications-please add to medication list. Wound #1 Right,Posterior Lower Leg o Santyl Enzymatic Ointment o Other: - Vitamin C, Zinc, Multivitamin Wound #2 Right,Distal,Posterior Lower Leg o Santyl Enzymatic Ointment o Other: - Vitamin C, Zinc, Multivitamin Electronic Signature(s) Signed: 10/10/2016 3:40:08 PM By: Evlyn Kanner MD, FACS Signed: 10/10/2016 3:52:38 PM By: Trish Fountain, [name] ([id number]) Entered By: Curtis Sites on 10/10/2016 11:39:11 Cowell, [name] ([id number]) -------------------------------------------------------------------------------- Problem List Details Patient Name: Kivi, Tangelia C. Date of Service: 10/10/2016 11:00 AM Medical Record Number: [id number] Patient Account Number: [id number] Date of Birth/Sex: 10-16-1960 (56 y.o. 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 Date of Service: 10/10/2016 11:00 AM Medical Record Number: [id number] Patient Account Number: [id number] Date of Birth/Sex: May 16, 1961 (56 y.o. 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 Severity: Patient states wound are getting worse. Duration: Patient has had the wound for > 8 months prior to seeking treatment at the wound center Timing: Pain in wound is constant (hurts all the  time) Context: The wound appeared gradually over time Modifying Factors: Other treatment(s) tried include:several courses of antibiotics have been tried including at the ER recently Associated Signs and Symptoms: Patient reports having increase swelling. 56 year old patient was seen about a month ago in the ER with a chronic wound to her right posterior calf which she had for 3 months. after workup a CT was also done and this showed cellulitis without a discrete drainable abscess and no evidence of mild fasciitis or pyomyositis. There Was also no septic arthritis or osteomyelitis. She was given injectable clindamycin and discharged home on oral clindamycin at that stage. Past medical history of diabetes mellitus, anemia, CHF, COPD, peripheral vascular disease and sleep apnea. She is known to have a carotid artery obstruction. she is status post coronary angioplasty with stents, carotid endarterectomy in 2016 on the left side by Dr. dew, right lower extremity angiography by Dr. dew in August  2016 with intervention. On review of the notes from her 05/27/2015 procedure by Dr. dew showed that she had peripheral artery disease with claudication bilateral lower extremity right greater than left. At that stage he did a right lower extremity angiogram with percutaneous angioplasty of the proximal and mid SFA, distal SFA and above- knee popliteal artery and placement of a stent in the mid to distal SFA and proximal popliteal artery. She smokes and has been doing this for several years. 10/03/2016 -- on 09/22/2016 and she had a procedure done by Dr. Wyn Quaker with aortogram and selective right lower extremity angiogram and placement of a stent after an angioplasty of the superficial femoral artery. There was near occlusion of the stenosis in the previously placed SFA stent. The popliteal artery is less than 30% stenosis. There was a two-vessel runoff distally with an anterior tibial artery and peroneal  artery [name], Cynthia C. ([id number]) without stenosis. 10/10/2016 -- the patient was seen in the emergency department at [facility], on 10/04/2016, for a right toe pain and was treated for cellulitis of the right big toe. X-ray done was normal and was asked to see the podiatrist and the patient was discharged home on Augmentin. x-ray of the right toes -- FINDINGS: Marginal osteophytosis noted at the IP and DIP joints. Joint spaces are preserved. No acute fracture or dislocation. No radiopaque foreign body. No erosive change, evidence of soft tissue defect, or dissecting subcutaneous gas. The patient was also seen by Dr. Gwyneth Revels on 10/06/2016 Objective Constitutional Pulse regular. Respirations normal and unlabored. Afebrile. Vitals Time Taken: 11:23 AM, Height: 62 in, Weight: 276 lbs, BMI: 50.5, Temperature: 98.1 F, Pulse: 81 bpm, Respiratory Rate: 18 breaths/min, Blood Pressure: 125/71 mmHg. Eyes Nonicteric. Reactive to light. Ears, Nose, Mouth, and Throat Lips, teeth, and gums WNL.Marland Kitchen Moist mucosa without lesions. Neck supple and nontender. No palpable supraclavicular or cervical adenopathy. Normal sized without goiter. Respiratory WNL. No retractions.. Breath sounds WNL, No rubs, rales, rhonchi, or wheeze.. Cardiovascular Heart rhythm and rate regular, no murmur or gallop.. Pedal Pulses WNL. No clubbing, cyanosis or edema. Chest Breasts symmetical and no nipple discharge.. Breast tissue WNL, no masses, lumps, or tenderness.. Lymphatic No adneopathy. No adenopathy. No adenopathy. Musculoskeletal Adexa without tenderness or enlargement.. Digits and nails w/o clubbing, cyanosis, infection, petechiae, ischemia, or inflammatory conditions.Marland Kitchen Psychiatric [name], Cynthia C. ([id number]) Judgement and insight Intact.. No evidence of depression, anxiety, or agitation.. General Notes: The right calf wound has slough but she is extremely tender and I cannot debrided sharply. We will  continue to use Santyl ointment locally. The right big toe does not have any open ulcerations and there is mild swelling which could be in keeping with gout Integumentary (Hair, Skin) No suspicious lesions. No crepitus or fluctuance. No peri-wound warmth or erythema. No masses.. Wound #1 status is Open. Original cause of wound was Gradually Appeared. The wound is located on the Right,Posterior Lower Leg. The wound measures 0.9cm length x 2.2cm width x 0.2cm depth; 1.555cm^2 area and 0.311cm^3 volume. The wound is limited to skin breakdown. There is no tunneling or undermining noted. There is a large amount of serous drainage noted. The wound margin is distinct with the outline attached to the wound base. There is no granulation within the wound bed. There is a large (67-100%) amount of necrotic tissue within the wound bed including Adherent Slough. The periwound skin appearance exhibited: Localized Edema, Moist, Erythema. The surrounding wound skin color is noted with erythema which is circumferential. Periwound  temperature was noted as No Abnormality. The periwound has tenderness on palpation. Wound #2 status is Open. Original cause of wound was Trauma. The wound is located on the Right,Distal,Posterior Lower Leg. The wound measures 1.1cm length x 0.3cm width x 0.1cm depth; 0.259cm^2 area and 0.026cm^3 volume. The wound is limited to skin breakdown. There is no tunneling or undermining noted. There is a large amount of serous drainage noted. The wound margin is flat and intact. There is no granulation within the wound bed. There is a large (67-100%) amount of necrotic tissue within the wound bed including Eschar and Adherent Slough. The periwound skin appearance exhibited: Moist. The periwound skin appearance did not exhibit: Callus, Crepitus, Excoriation, Fluctuance, Friable, Induration, Localized Edema, Rash, Scarring, Dry/Scaly, Maceration, Atrophie Blanche, Cyanosis, Ecchymosis, Hemosiderin  Staining, Mottled, Pallor, Rubor, Erythema. Periwound temperature was noted as No Abnormality. The periwound has tenderness on palpation. 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 ([id number]) Plan Wound Cleansing: Wound #1 Right,Posterior Lower Leg: Clean wound with Normal Saline. Cleanse wound with mild soap and water Wound #2 Right,Distal,Posterior Lower Leg: Clean wound with Normal Saline. Cleanse wound with mild soap and water Anesthetic: Wound #1 Right,Posterior Lower Leg: Topical Lidocaine 4% cream applied to wound bed prior to debridement - for clinic use Wound #2 Right,Distal,Posterior Lower Leg: Topical Lidocaine 4% cream applied to wound bed prior to debridement - for clinic use Skin Barriers/Peri-Wound Care: Wound #1 Right,Posterior Lower Leg: Skin Prep Wound #2 Right,Distal,Posterior Lower Leg: Skin Prep Primary Wound Dressing: Wound #1 Right,Posterior Lower Leg: Santyl Ointment Wound #2 Right,Distal,Posterior Lower Leg: Santyl Ointment Secondary Dressing: Wound #1 Right,Posterior Lower Leg: Dry Gauze Other - telfa island Wound #2 Right,Distal,Posterior Lower Leg: Dry Gauze Other - telfa island Dressing Change Frequency: Wound #1 Right,Posterior Lower Leg: Change dressing every day. Wound #2 Right,Distal,Posterior Lower Leg: Change dressing every day. Follow-up Appointments: Wound #1 Right,Posterior Lower Leg: Return Appointment in 1 week. Wound #2 Right,Distal,Posterior Lower Leg: Return Appointment in 1 week. Edema Control: Wound #1 Right,Posterior Lower Leg: Elevate legs to the level of the heart  and pump ankles as often as possible Wound #2 Right,Distal,Posterior Lower Leg: Elevate legs to the level of the heart and pump ankles as often as possible Additional Orders / Instructions: Wound #1 Right,Posterior Lower Leg: Stop Smoking [name], Cynthia C. ([id number]) Increase protein intake. Wound #2 Right,Distal,Posterior Lower Leg: Stop Smoking Increase protein intake. Medications-please add to medication list.: Wound #1 Right,Posterior Lower Leg: Santyl Enzymatic Ointment Other: - Vitamin C, Zinc, Multivitamin Wound #2 Right,Distal,Posterior Lower Leg: Santyl Enzymatic Ointment Other: - Vitamin C, Zinc, Multivitamin After review have recommended: 1. Santyl ointment to be applied to the wound with dressing changes daily. 2. review by her vascular surgeons as her follow-up appointment is pending 3. she may also see her PCP regarding the possibility of this being gout 4. Good control of her diabetes mellitus with a recent A1c to be done 5. I'm pleased to learn that she is still off smoking 6. regular visits the wound center Electronic Signature(s) Signed: 10/10/2016 11:50:40 AM By: Evlyn Kanner MD, FACS Entered By: Evlyn Kanner on 10/10/2016 11:50:40 Panameno, [name] ([id number]) -------------------------------------------------------------------------------- SuperBill Details Patient Name: Cynthia Hey  C. Date of Service: 10/10/2016 Medical Record Number: [id number] Patient Account Number: [id number] Date of Birth/Sex: 18-Sep-1961 (56 y.o. Female) Treating RN: Curtis Sites Primary Care Physician: Sandrea Hughs Other Clinician: Referring Physician: Sandrea Hughs Treating Physician/Extender: Rudene Gray in Treatment: 5 Diagnosis Coding ICD-10 Codes Code Description E11.622 Type 2 diabetes mellitus with other skin ulcer L97.212 Non-pressure chronic ulcer of right calf with fat layer exposed I70.232 Atherosclerosis of native arteries of right leg with ulceration of  calf F17.218 Nicotine dependence, cigarettes, with other nicotine-induced disorders E66.01 Morbid (severe) obesity due to excess calories Facility Procedures CPT4 Code: [id number] Description: 99213 - WOUND CARE VISIT-LEV 3 EST PT Modifier: Quantity: 1 Physician Procedures CPT4 Code Description: [id number] 99213 - WC PHYS LEVEL 3 - EST PT ICD-10 Description Diagnosis E11.622 Type 2 diabetes mellitus with other skin ulcer L97.212 Non-pressure chronic ulcer of right calf with fat lay I70.232 Atherosclerosis of native arteries  of right leg with Modifier: er exposed ulceration of c Quantity: 1 alf Electronic Signature(s) Signed: 10/10/2016 11:50:54 AM By: Evlyn Kanner MD, FACS Entered By: Evlyn Kanner on 10/10/2016 11:50:54

## 2016-10-17 ENCOUNTER — Encounter: Payer: Medicaid Other | Admitting: Surgery

## 2016-10-17 DIAGNOSIS — E11622 Type 2 diabetes mellitus with other skin ulcer: Secondary | ICD-10-CM | POA: Diagnosis not present

## 2016-10-18 NOTE — Progress Notes (Signed)
[name] ([id number]) Visit Report for 10/17/2016 Chief Complaint Document Details Patient Name: [name], [name]. Date of Service: 10/17/2016 1:30 PM Medical Record Number: [id number] Patient Account Number: [id number] Date of Birth/Sex: [birth year] (56 y.o. 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 Date of Service: 10/17/2016 1:30 PM Medical Record Number: [id number] Patient Account Number: [id number] Date of Birth/Sex: Feb 27, 1961 (56 y.o. Female) Treating RN: Curtis Sites Primary Care Provider: Sandrea Hughs Other Clinician: Referring Provider: Sandrea Hughs Treating Provider/Extender: Rudene Re in Treatment: 6 History of Present Illness Location: right posterior calf Quality: Patient reports experiencing a sharp pain to affected area(s). Severity: Patient states wound are getting worse. Duration: Patient has had the wound for > 8 months prior to seeking treatment at the wound center Timing: Pain in wound is constant (hurts all the time) Context: The wound appeared gradually over time Modifying Factors: Other treatment(s) tried include:several courses of antibiotics have been tried including at the ER recently Associated Signs and Symptoms: Patient reports having increase swelling. HPI Description: 56 year old patient was  seen about a month ago in the ER with a chronic wound to her right posterior calf which she had for 3 months. after workup a CT was also done and this showed cellulitis without a discrete drainable abscess and no evidence of mild fasciitis or pyomyositis. There Was also no septic arthritis or osteomyelitis. She was given injectable clindamycin and discharged home on oral clindamycin at that stage. Past medical history of diabetes mellitus, anemia, CHF, COPD, peripheral vascular disease and sleep apnea. She is known to have a carotid artery obstruction. she is status post coronary angioplasty with stents, carotid endarterectomy in 2016 on the left side by Dr. dew, right lower extremity angiography by Dr. dew in August 2016 with intervention. On review of the notes from her 05/27/2015 procedure by Dr. Wyn Quaker showed that she had peripheral artery disease with claudication bilateral lower extremity right greater than left. At that stage he did a right lower extremity angiogram with percutaneous angioplasty of the proximal and mid SFA, distal SFA and above- knee popliteal artery and placement of a stent in the mid to distal SFA and proximal popliteal artery. She smokes and has been doing this for several years. 10/03/2016 -- on 09/22/2016 and she had a procedure done by Dr. Wyn Quaker with aortogram and selective right lower extremity angiogram and placement of a stent after an angioplasty of the superficial femoral artery. There was near occlusion of the stenosis in the previously placed SFA stent. The popliteal artery is less than 30% stenosis. There was a two-vessel runoff distally with an anterior tibial artery and peroneal artery without stenosis. 10/10/2016 -- the patient was seen in the emergency department at [facility], on 10/04/2016, for a right toe pain and was treated for cellulitis of the right big toe. X-ray done was normal and was asked to see the podiatrist and the patient was discharged  home on Augmentin. x-ray of the right toes -- FINDINGS: Marginal osteophytosis noted at the IP and  DIP joints. Joint spaces are preserved. No acute fracture or dislocation. No radiopaque foreign body. No erosive change, evidence of soft tissue defect, or dissecting subcutaneous gas. The patient was also seen by Dr. Gwyneth Revels on 10/06/2016 [name] ([id number]) Electronic Signature(s) Signed: 10/17/2016 2:13:27 PM By: Evlyn Kanner MD, FACS Entered By: Evlyn Kanner on 10/17/2016 14:13:26 Austad, Barrie Folk ([id number]) -------------------------------------------------------------------------------- Physical Exam Details Patient Name: Cynthia Hey C. Date of Service: 10/17/2016 1:30 PM Medical Record Number: [id number] Patient Account Number: [id number] Date of Birth/Sex: [birth year] (56 y.o. Female) Treating RN: Curtis Sites Primary Care Provider: Sandrea Hughs Other Clinician: Referring Provider: Sandrea Hughs Treating Provider/Extender: Rudene Re in Treatment: 6 Constitutional . Pulse regular. Respirations normal and unlabored. Afebrile. . Eyes Nonicteric. Reactive to light. Ears, Nose, Mouth, and Throat Lips, teeth, and gums WNL.Marland Kitchen Moist mucosa without lesions. Neck supple and nontender. No palpable supraclavicular or cervical adenopathy. Normal sized without goiter. Respiratory WNL. No retractions.. Breath sounds WNL, No rubs, rales, rhonchi, or wheeze.. Cardiovascular Heart rhythm and rate regular, no murmur or gallop.. Pedal Pulses WNL. No clubbing, cyanosis or edema. Lymphatic No adneopathy. No adenopathy. No adenopathy. Musculoskeletal Adexa without tenderness or enlargement.. Digits and nails w/o clubbing, cyanosis, infection, petechiae, ischemia, or inflammatory conditions.. Integumentary (Hair, Skin) No suspicious lesions. No crepitus or fluctuance. No peri-wound warmth or erythema. No masses.Marland Kitchen Psychiatric Judgement and insight Intact.. No evidence of  depression, anxiety, or agitation.. Notes the right calf has significant amount of slough but she will not consent to debridement as it is too tender. The right big toe again clinically has gout but she has not seen her PCP regarding this. Electronic Signature(s) Signed: 10/17/2016 2:14:07 PM By: Evlyn Kanner MD, FACS Entered By: Evlyn Kanner on 10/17/2016 14:14:06 [name] ([id number]) -------------------------------------------------------------------------------- Physician Orders Details Patient Name: [name]. Date of Service: 10/17/2016 1:30 PM Medical Record Number: [id number] Patient Account Number: [id number] Date of Birth/Sex: 04-11-1961 (56 y.o. Female) Treating RN: Curtis Sites Primary Care Provider: Sandrea Hughs Other Clinician: Referring Provider: Sandrea Hughs Treating Provider/Extender: Rudene Re in Treatment: 6 Verbal / Phone Orders: Yes Clinician: Curtis Sites Read Back and Verified: Yes Diagnosis Coding Wound Cleansing Wound #1 Right,Posterior Lower Leg o Clean wound with Normal Saline. o Cleanse wound with mild soap and water Wound #2 Right,Distal,Posterior Lower Leg o Clean wound with Normal Saline. o Cleanse wound with mild soap and water Anesthetic Wound #1 Right,Posterior Lower Leg o Topical Lidocaine 4% cream applied to wound bed prior to debridement - for clinic use Wound #2 Right,Distal,Posterior Lower Leg o Topical Lidocaine 4% cream applied to wound bed prior to debridement - for clinic use Skin Barriers/Peri-Wound Care Wound #1 Right,Posterior Lower Leg o Skin Prep Wound #2 Right,Distal,Posterior Lower Leg o Skin Prep Primary Wound Dressing Wound #1 Right,Posterior Lower Leg o Santyl Ointment Wound #2 Right,Distal,Posterior Lower Leg o Santyl Ointment Secondary Dressing Wound #1 Right,Posterior Lower Leg o Dry Gauze o Other - telfa island Wound #2 Right,Distal,Posterior Lower Leg Frosch,  Brenlynn C. ([id number]) o Dry Gauze o Other - telfa island Dressing Change Frequency Wound #1 Right,Posterior Lower Leg o Change dressing every day. Wound #2 Right,Distal,Posterior Lower Leg o Change dressing every day. Follow-up Appointments Wound #1 Right,Posterior Lower Leg o Return Appointment in 1 week. Wound #2 Right,Distal,Posterior Lower Leg o Return Appointment in 1 week. Edema Control Wound #1 Right,Posterior Lower Leg o Elevate legs to the level of the heart and pump ankles as often as possible Wound #2  Right,Distal,Posterior Lower Leg o Elevate legs to the level of the heart and pump ankles as often as possible Additional Orders / Instructions Wound #1 Right,Posterior Lower Leg o Stop Smoking o Increase protein intake. Wound #2 Right,Distal,Posterior Lower Leg o Stop Smoking o Increase protein intake. Medications-please add to medication list. Wound #1 Right,Posterior Lower Leg o Santyl Enzymatic Ointment o Other: - Vitamin C, Zinc, Multivitamin Wound #2 Right,Distal,Posterior Lower Leg o Santyl Enzymatic Ointment o Other: - Vitamin C, Zinc, Multivitamin Electronic Signature(s) Signed: 10/17/2016 4:24:55 PM By: Evlyn Kanner MD, FACS Signed: 10/17/2016 4:53:16 PM By: Trish Fountain, Barrie Folk ([id number]) Entered By: Curtis Sites on 10/17/2016 14:02:56 Koopman, Barrie Folk ([id number]) -------------------------------------------------------------------------------- Problem List Details Patient Name: Bilger, Kamil C. Date of Service: 10/17/2016 1:30 PM Medical Record Number: [id number] Patient Account Number: [id number] Date of Birth/Sex: Jan 25, 1961 (56 y.o. 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 Date of Service: 10/17/2016 1:30 PM Medical Record Number: [id number] Patient Account Number: [id number] Date of Birth/Sex: 07/14/1961 (56 y.o. 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 Severity: Patient states wound are getting worse. Duration: Patient has had the wound for > 8 months prior to seeking treatment at the wound center Timing: Pain in wound is constant (hurts all the time) Context: The wound appeared gradually over time Modifying Factors: Other treatment(s) tried include:several courses of antibiotics have been tried including at the ER recently Associated Signs and Symptoms:  Patient reports having increase swelling. 56 year old patient was seen about a month ago in the ER with a chronic wound to her right posterior calf which she had for 3 months. after workup a CT was also done and this showed cellulitis without a discrete drainable abscess and no evidence of mild fasciitis or pyomyositis. There Was also no septic arthritis or osteomyelitis. She was given injectable clindamycin and discharged home on oral clindamycin at that stage. Past medical history of diabetes mellitus, anemia, CHF, COPD, peripheral vascular disease and sleep apnea. She is known to have a carotid artery obstruction. she is status post coronary angioplasty with stents, carotid endarterectomy in 2016 on the left side by Dr. dew, right lower extremity angiography by Dr. dew in August 2016 with intervention. On review of the notes from her 05/27/2015 procedure by Dr. Wyn Quaker showed that she had peripheral artery disease with claudication  bilateral lower extremity right greater than left. At that stage he did a right lower extremity angiogram with percutaneous angioplasty of the proximal and mid SFA, distal SFA and above- knee popliteal artery and placement of a stent in the mid to distal SFA and proximal popliteal artery. She smokes and has been doing this for several years. 10/03/2016 -- on 09/22/2016 and she had a procedure done by Dr. Wyn Quaker with aortogram and selective right lower extremity angiogram and placement of a stent after an angioplasty of the superficial femoral artery. There was near occlusion of the stenosis in the previously placed SFA stent. The popliteal artery is less than 30% stenosis. There was a two-vessel runoff distally with an anterior tibial artery and peroneal artery Siverling, Maybell C. ([id number]) without stenosis. 10/10/2016 -- the patient was seen in the emergency department at [facility], on 10/04/2016, for a right toe pain and was treated for cellulitis of the right big  toe. X-ray done was normal and was asked to see the podiatrist and the patient was discharged home on Augmentin. x-ray of the right toes -- FINDINGS: Marginal osteophytosis noted at the IP and DIP joints. Joint spaces are preserved. No acute fracture or dislocation. No radiopaque foreign body. No erosive change, evidence of soft tissue defect, or dissecting subcutaneous gas. The patient was also seen by Dr. Gwyneth Revels on 10/06/2016 Objective Constitutional Pulse regular. Respirations normal and unlabored. Afebrile. Vitals Time Taken: 1:49 PM, Height: 62 in, Weight: 276 lbs, BMI: 50.5, Temperature: 98.2 F, Pulse: 90 bpm, Respiratory Rate: 18 breaths/min, Blood Pressure: 129/73 mmHg. Eyes Nonicteric. Reactive to light. Ears, Nose, Mouth, and Throat Lips, teeth, and gums WNL.Marland Kitchen Moist mucosa without lesions. Neck supple and nontender. No palpable supraclavicular or cervical adenopathy. Normal sized without goiter. Respiratory WNL. No retractions.. Breath sounds WNL, No rubs, rales, rhonchi, or wheeze.. Cardiovascular Heart rhythm and rate regular, no murmur or gallop.. Pedal Pulses WNL. No clubbing, cyanosis or edema. Lymphatic No adneopathy. No adenopathy. No adenopathy. Musculoskeletal Adexa without tenderness or enlargement.. Digits and nails w/o clubbing, cyanosis, infection, petechiae, ischemia, or inflammatory conditions.Marland Kitchen Psychiatric Judgement and insight Intact.. No evidence of depression, anxiety, or agitation.Carin Hock, Barrie Folk ([id number]) General Notes: the right calf has significant amount of slough but she will not consent to debridement as it is too tender. The right big toe again clinically has gout but she has not seen her PCP regarding this. Integumentary (Hair, Skin) No suspicious lesions. No crepitus or fluctuance. No peri-wound warmth or erythema. No masses.. Wound #1 status is Open. Original cause of wound was Gradually Appeared. The wound is located on  the Right,Posterior Lower Leg. The wound measures 0.9cm length x 2.9cm width x 0.3cm depth; 2.05cm^2 area and 0.615cm^3 volume. The wound is limited to skin breakdown. There is no tunneling or undermining noted. There is a large amount of serous drainage noted. The wound margin is distinct with the outline attached to the wound base. There is small (1-33%) pink granulation within the wound bed. There is a large (67-100%) amount of necrotic tissue within the wound bed including Adherent Slough. The periwound skin appearance exhibited: Erythema. The periwound skin appearance did not exhibit: Callus, Crepitus, Excoriation, Induration, Rash, Scarring, Dry/Scaly, Maceration, Atrophie Blanche, Cyanosis, Ecchymosis, Hemosiderin Staining, Mottled, Pallor, Rubor. The surrounding wound skin color is noted with erythema which is circumferential. Periwound temperature was noted as No Abnormality. The periwound has tenderness on palpation. Wound #2 status is Open. Original cause of wound was Trauma. The wound  is located on the Right,Distal,Posterior Lower Leg. The wound measures 1cm length x 0.3cm width x 0.1cm depth; 0.236cm^2 area and 0.024cm^3 volume. The wound is limited to skin breakdown. There is no tunneling or undermining noted. There is a large amount of serous drainage noted. The wound margin is flat and intact. There is no granulation within the wound bed. There is a large (67-100%) amount of necrotic tissue within the wound bed including Eschar and Adherent Slough. The periwound skin appearance did not exhibit: Callus, Crepitus, Excoriation, Induration, Rash, Scarring, Dry/Scaly, Maceration, Atrophie Blanche, Cyanosis, Ecchymosis, Hemosiderin Staining, Mottled, Pallor, Rubor, Erythema. Periwound temperature was noted as No Abnormality. The periwound has tenderness on palpation. 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 ([id number]) Wound #1 Right,Posterior Lower Leg: Clean wound with Normal Saline. Cleanse wound with mild soap and water Wound #2 Right,Distal,Posterior Lower Leg: Clean wound with Normal Saline. Cleanse wound with mild soap and water Anesthetic: Wound #1 Right,Posterior Lower Leg: Topical Lidocaine 4% cream applied to wound bed prior to debridement - for clinic use Wound #2 Right,Distal,Posterior Lower Leg: Topical Lidocaine 4% cream applied to wound bed prior to debridement - for clinic use Skin Barriers/Peri-Wound Care: Wound #1 Right,Posterior Lower Leg: Skin Prep Wound #2 Right,Distal,Posterior Lower Leg: Skin Prep Primary Wound Dressing: Wound #1 Right,Posterior Lower Leg: Santyl Ointment Wound #2 Right,Distal,Posterior Lower Leg: Santyl Ointment Secondary Dressing: Wound #1 Right,Posterior Lower Leg: Dry Gauze Other - telfa island Wound #2 Right,Distal,Posterior Lower Leg: Dry Gauze Other - telfa island Dressing Change Frequency: Wound #1 Right,Posterior Lower Leg: Change dressing every day. Wound #2 Right,Distal,Posterior Lower Leg: Change dressing every day. Follow-up Appointments: Wound #1 Right,Posterior Lower Leg: Return Appointment in 1 week. Wound #2 Right,Distal,Posterior Lower Leg: Return Appointment in 1 week. Edema Control: Wound #1 Right,Posterior Lower Leg: Elevate legs to the level of the heart and pump ankles as often as possible Wound #2 Right,Distal,Posterior Lower Leg: Elevate legs to the level of the heart and pump ankles as often as possible Additional Orders / Instructions: Wound #1 Right,Posterior Lower Leg: Stop Smoking Increase  protein intake. Wound #2 Right,Distal,Posterior Lower Leg: Stop Smoking Increase protein intake. Medications-please add to medication list.: Treasa SchoolSUGGS, Tamanika C. ([id number]) Wound #1 Right,Posterior Lower Leg: Santyl Enzymatic Ointment Other: - Vitamin C, Zinc, Multivitamin Wound #2 Right,Distal,Posterior Lower Leg: Santyl Enzymatic Ointment Other: - Vitamin C, Zinc, Multivitamin After review have recommended: 1. Santyl ointment to be applied to the wound with dressing changes daily. 2. review by her vascular surgeons as her follow-up appointment is pending 3. she may also see her PCP regarding the possibility of this being gout 4. Good control of her diabetes mellitus with a recent A1c to be done 5. I'm pleased to learn that she is still off smoking 6. regular visits the wound center Electronic Signature(s) Signed: 10/17/2016 2:14:43 PM By: Evlyn KannerBritto, Tsering Leaman MD, FACS Entered By: Evlyn KannerBritto, Dimas Scheck on 10/17/2016 14:14:42 Sowash, Barrie FolkVERNA C. ([id number]) -------------------------------------------------------------------------------- SuperBill Details Patient Name: Cynthia HeySUGGS, Dejana C. Date of Service: 10/17/2016 Medical Record Number: [id number] Patient Account Number: [id number] Date of Birth/Sex: 04/30/1961 (56 y.o. 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 F17.218 Nicotine dependence, cigarettes, with other nicotine-induced disorders E66.01 Morbid (severe) obesity due to excess calories Facility Procedures CPT4 Code: 16109604 Description: 99213 - WOUND CARE VISIT-LEV 3 EST PT Modifier: Quantity:  1 Physician Procedures CPT4 Code Description: [id number] 99213 - WC PHYS LEVEL 3 - EST PT ICD-10 Description Diagnosis E11.622 Type 2 diabetes mellitus with other skin ulcer L97.212 Non-pressure chronic ulcer of right calf with fat lay I70.232 Atherosclerosis of native arteries  of right leg with E66.01 Morbid (severe) obesity due to excess calories Modifier: er exposed ulceration of c Quantity: 1 alf Electronic Signature(s) Signed: 10/17/2016 2:18:54 PM By: Evlyn Kanner MD, FACS Entered By: Evlyn Kanner on 10/17/2016 14:18:54

## 2016-10-18 NOTE — Progress Notes (Signed)
ARADIA, ESTEY ([id number]) Visit Report for 10/17/2016 Arrival Information Details Patient Name: [name], [name]. Date of Service: 10/17/2016 1:30 PM Medical Record Number: [id number] Patient Account Number: [id number] Date of Birth/Sex: Dec 16, 1960 (56 y.o. Female) Treating RN: Montey Hora Primary Care Dorthia Tout: Freddy Finner Other Clinician: Referring Leif Loflin: Freddy Finner Treating Jerzy Roepke/Extender: Frann Rider in Treatment: 6 Visit Information History Since Last Visit Added or deleted any medications: No Patient Arrived: Ambulatory Any new allergies or adverse reactions: No Arrival Time: 13:47 Had a fall or experienced change in No Accompanied By: dtr activities of daily living that may affect Transfer Assistance: None risk of falls: Patient Identification Verified: Yes Signs or symptoms of abuse/neglect since last No Secondary Verification Process Yes visito Completed: Hospitalized since last visit: No Patient Requires Transmission- No Has Dressing in Place as Prescribed: Yes Based Precautions: Pain Present Now: Yes Patient Has Alerts: Yes Patient Alerts: Patient on Blood Thinner DM II Plavix Electronic Signature(s) Signed: 10/17/2016 4:53:16 PM By: Montey Hora Entered By: Montey Hora on 10/17/2016 13:48:04 [name], [name] ([id number]) -------------------------------------------------------------------------------- Clinic Level of Care Assessment Details Patient Name: Cynthia Aquas Gray. Date of Service: 10/17/2016 1:30 PM Medical Record Number: [id number] Patient Account Number: [id number] Date of Birth/Sex: 01/31/1961 (56 y.o. Female) Treating RN: Montey Hora Primary Care Lamichael Youkhana: Freddy Finner Other Clinician: Referring Allanna Bresee: Freddy Finner Treating Horald Birky/Extender: Frann Rider in Treatment: 6 Clinic Level of Care Assessment Items TOOL 4 Quantity Score _0  - Use when only an EandM is performed on FOLLOW-UP visit 0 ASSESSMENTS -  Nursing Assessment / Reassessment X - Reassessment of Co-morbidities (includes updates in patient status) 1 10 X - Reassessment of Adherence to Treatment Plan 1 5 ASSESSMENTS - Wound and Skin Assessment / Reassessment _1  - Simple Wound Assessment / Reassessment - one wound 0 X - Complex Wound Assessment / Reassessment - multiple wounds 2 5 _2  - Dermatologic / Skin Assessment (not related to wound area) 0 ASSESSMENTS - Focused Assessment _3  - Circumferential Edema Measurements - multi extremities 0 _4  - Nutritional Assessment / Counseling / Intervention 0 X - Lower Extremity Assessment (monofilament, tuning fork, pulses) 1 5 _5  - Peripheral Arterial Disease Assessment (using hand held doppler) 0 ASSESSMENTS - Ostomy and/or Continence Assessment and Care _6  - Incontinence Assessment and Management 0 _7  - Ostomy Care Assessment and Management (repouching, etc.) 0 PROCESS - Coordination of Care X - Simple Patient / Family Education for ongoing care 1 15 _8  - Complex (extensive) Patient / Family Education for ongoing care 0 _9  - Staff obtains Programmer, systems, Records, Test Results / Process Orders 0 _10  - Staff telephones HHA, Nursing Homes / Clarify orders / etc 0 _11  - Routine Transfer to another Facility (non-emergent condition) 0 [name], [name]. ([id number]) _12  - Routine Hospital Admission (non-emergent condition) 0 _13  - New Admissions / Biomedical engineer / Ordering NPWT, Apligraf, etc. 0 _14  - Emergency Hospital Admission (emergent condition) 0 X - Simple Discharge Coordination 1 10 _15  - Complex (extensive) Discharge Coordination 0 PROCESS - Special Needs _16  - Pediatric / Minor Patient Management 0 _17  - Isolation Patient Management 0 _18  - Hearing / Language / Visual special needs 0 _19  - Assessment of Community assistance (transportation, D/Gray planning, etc.) 0 _20  - Additional assistance / Altered mentation 0 _21  - Support Surface(s) Assessment (bed, cushion, seat, etc.) 0 INTERVENTIONS -  Wound Cleansing / Measurement _22  - Simple Wound Cleansing - one wound 0 X - Complex Wound Cleansing - multiple wounds 2 5 X - Wound Imaging (  photographs - any number of wounds) 1 5 _0  - Wound Tracing (instead of photographs) 0 _1  - Simple Wound Measurement - one wound 0 X - Complex Wound Measurement - multiple wounds 2 5 INTERVENTIONS - Wound Dressings X - Small Wound Dressing one or multiple wounds 2 10 _2  - Medium Wound Dressing one or multiple wounds 0 _3  - Large Wound Dressing one or multiple wounds 0 X - Application of Medications - topical 1 5 <[id number]>_1<\/[id number]>_4  - Application of Medications - injection 0 INTERVENTIONS - Miscellaneous _5  - External ear exam 0 [name], [name]. ([id number]) _6  - Specimen Collection (cultures, biopsies, blood, body fluids, etc.) 0 _7  - Specimen(s) / Culture(s) sent or taken to Lab for analysis 0 _8  - Patient Transfer (multiple staff / Harrel Lemon Lift / Similar devices) 0 _9  - Simple Staple / Suture removal (25 or less) 0 _10  - Complex Staple / Suture removal (26 or more) 0 _11  - Hypo / Hyperglycemic Management (close monitor of Blood Glucose) 0 _12  - Ankle / Brachial Index (ABI) - do not check if billed separately 0 X - Vital Signs 1 5 Has the patient been seen at the hospital within the last three years: Yes Total Score: 110 Level Of Care: New/Established - Level 3 Electronic Signature(s) Signed: 10/17/2016 4:53:16 PM By: Montey Hora Entered By: Montey Hora on 10/17/2016 14:03:35 [name], [name] ([id number]) -------------------------------------------------------------------------------- Encounter Discharge Information Details Patient Name: Cynthia Aquas Gray. Date of Service: 10/17/2016 1:30 PM Medical Record Number: [id number] Patient Account Number: [id number] Date of Birth/Sex: 10/07/1960 (56 y.o. Female) Treating RN: Montey Hora Primary Care Brylea Pita: Freddy Finner Other Clinician: Referring Keymani Glynn: Freddy Finner Treating Charlton Boule/Extender: Frann Rider in Treatment: 6 Encounter Discharge Information Items Discharge Pain Level: 0 Discharge Condition: Stable Ambulatory Status: Ambulatory Discharge Destination: Home Transportation: Private Auto Accompanied By: dtr Schedule Follow-up Appointment: Yes Medication Reconciliation completed and provided to Patient/Care No Gailene Youkhana: Provided on Clinical Summary of Care: 10/17/2016 Form Type Recipient Paper Patient VS Electronic Signature(s) Signed: 10/17/2016 2:17:16 PM By: Ruthine Dose Entered By: Ruthine Dose on 10/17/2016 14:17:16 Penza, [name] ([id number]) -------------------------------------------------------------------------------- Lower Extremity Assessment Details Patient Name: [name], Gayla Gray. Date of Service: 10/17/2016 1:30 PM Medical Record Number: [id number] Patient Account Number: [id number] Date of Birth/Sex: 1961-05-16 (56 y.o. Female) Treating RN: Montey Hora Primary Care Sherita Decoste: Freddy Finner Other Clinician: Referring Blimie Vaness: Freddy Finner Treating Atalia Litzinger/Extender: Frann Rider in Treatment: 6 Edema Assessment Assessed: [Left: No] [Right: No] Edema: [Left: N] [Right: o] Vascular Assessment Pulses: Dorsalis Pedis Palpable: [Right:Yes] Posterior Tibial Extremity colors, hair growth, and conditions: Extremity Color: [Right:Normal] Hair Growth on Extremity: [Right:No] Temperature of Extremity: [Right:Warm] Capillary Refill: [Right:< 3 seconds] Electronic Signature(s) Signed: 10/17/2016 4:53:16 PM By: Montey Hora Entered By: Montey Hora on 10/17/2016 13:57:10 [name], [name]. ([id number]) -------------------------------------------------------------------------------- Multi Wound Chart Details Patient Name: Cynthia Aquas Gray. Date of Service: 10/17/2016 1:30 PM Medical Record Number: [id number] Patient Account Number: [id number] Date of Birth/Sex: Jun 04, 1961 (56 y.o. 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 Comorbid History: Anemia, Chronic Anemia, Chronic N/A Obstructive Pulmonary Obstructive Pulmonary  Disease (COPD), Sleep Disease (COPD), Sleep Apnea, Congestive Heart Apnea, Congestive Heart Failure, Coronary Artery Failure, Coronary Artery Disease, Hypertension, Disease, Hypertension, Peripheral Venous Peripheral Venous Disease, Type II Diabetes, Disease, Type II Diabetes, Osteoarthritis, Neuropathy Osteoarthritis, Neuropathy Date Acquired: 01/05/2016 10/05/2016 N/A Weeks of Treatment: 6 1 N/A Wound Status: Open Open N/A Measurements L x W x D 0.9x2.9x0.3 1x0.3x0.1 N/A (cm) Area (cm) : 2.05 0.236 N/A Volume (cm) : 0.615 0.024 N/A % Reduction in Area: -93.40% 8.90% N/A % Reduction in Volume: -480.20% 7.70% N/A [name], [name]. ([id number]) Classification: Grade 1 Full Thickness Without N/A Exposed Support Structures HBO Classification: N/A Grade 1 N/A Exudate Amount: Large Large N/A Exudate Type: Serous Serous N/A Exudate Color: amber amber N/A Wound Margin: Distinct, outline attached Flat and Intact N/A Granulation Amount: Small (1-33%) None Present (0%) N/A Granulation Quality: Pink N/A N/A Necrotic Amount: Large (67-100%) Large (67-100%) N/A Necrotic Tissue: Adherent Slough Eschar, Adherent Slough N/A Exposed Structures: Fascia: No Fascia: No N/A Fat Layer (Subcutaneous Fat Layer (Subcutaneous Tissue) Exposed: No Tissue) Exposed:  No Tendon: No Tendon: No Muscle: No Muscle: No Joint: No Joint: No Bone: No Bone: No Limited to Skin Limited to Skin Breakdown Breakdown Epithelialization: None None N/A Periwound Skin Texture: Excoriation: No Excoriation: No N/A Induration: No Induration: No Callus: No Callus: No Crepitus: No Crepitus: No Rash: No Rash: No Scarring: No Scarring: No Periwound Skin Maceration: No Maceration: No N/A Moisture: Dry/Scaly: No Dry/Scaly: No Periwound Skin Color: Erythema: Yes Atrophie Blanche: No N/A Atrophie Blanche: No Cyanosis: No Cyanosis: No Ecchymosis: No Ecchymosis: No Erythema: No Hemosiderin Staining: No Hemosiderin Staining: No Mottled: No Mottled: No Pallor: No Pallor: No Rubor: No Rubor: No Erythema Location: Circumferential N/A N/A Temperature: No Abnormality No Abnormality N/A Tenderness on Yes Yes N/A Palpation: Wound Preparation: Ulcer Cleansing: Ulcer Cleansing: N/A Rinsed/Irrigated with Rinsed/Irrigated with Saline Saline Topical Anesthetic Topical Anesthetic Applied: Other: lidocaine Applied: Other: lidocaine 4% 4% [name], [name]. ([id number]) Treatment Notes Wound #1 (Right, Posterior Lower Leg) 1. Cleansed with: Clean wound with Normal Saline 2. Anesthetic Topical Lidocaine 4% cream to wound bed prior to debridement 4. Dressing Applied: Santyl Ointment 5. Secondary [place] Wound #2 (Right, Distal, Posterior Lower Leg) 1. Cleansed with: Clean wound with Normal Saline 2. Anesthetic Topical Lidocaine 4% cream to wound bed prior to debridement 4. Dressing Applied: Santyl Ointment 5. Secondary Dressing Applied Dry [place] Signature(s) Signed: 10/17/2016 2:12:36 PM By: Christin Fudge MD, FACS Entered By: Christin Fudge on 10/17/2016 14:12:36 [name] ([id number]) -------------------------------------------------------------------------------- [place]  Details Patient Name: [name]. Date of Service: 10/17/2016 1:30 PM Medical Record Number: [id number] Patient Account Number: [id number] Date of Birth/Sex: 1961-04-28 (56 y.o. Female) Treating RN: Montey Hora Primary Care Alphonse Asbridge: Freddy Finner Other Clinician: Referring Lida Berkery: Freddy Finner Treating Jacobs Golab/Extender: Frann Rider in Treatment: 6 Active Inactive ` Abuse / Safety / Falls / Self Care Management Nursing Diagnoses: Potential for falls Goals: Patient will remain injury free Date Initiated: 09/05/2016 Target Resolution Date: 10/24/2016 Goal Status: Active Interventions: Assess fall risk on admission and as needed Assess impairment of mobility on admission and as needed per policy Notes: ` Nutrition Nursing Diagnoses: Imbalanced nutrition Impaired glucose control: actual or potential Goals: Patient/caregiver agrees to and verbalizes understanding of need to use nutritional supplements and/or vitamins as prescribed Date Initiated: 09/05/2016 Target Resolution Date: 10/24/2016 Goal Status: Active Patient/caregiver verbalizes understanding of need to maintain therapeutic glucose control per primary care  physician Date Initiated: 09/05/2016 Target Resolution Date: 10/24/2016 Goal Status: Active Patient/caregiver will maintain therapeutic glucose control Date Initiated: 09/05/2016 Target Resolution Date: 10/24/2016 Goal Status: Active CERISSA, ZEIGER ([id number]) Interventions: Assess patient nutrition upon admission and as needed per policy Provide education on elevated blood sugars and impact on wound healing Provide education on nutrition Treatment Activities: Education provided on Nutrition : 09/05/2016 Notes: ` Orientation to the Wound Care Program Nursing Diagnoses: Knowledge deficit related to the wound healing center program Goals: Patient/caregiver will verbalize understanding of the [place] Date Initiated:  09/05/2016 Target Resolution Date: 10/24/2016 Goal Status: Active Interventions: Provide education on orientation to the wound center Notes: ` Pain, Acute or Chronic Nursing Diagnoses: Pain, acute or chronic: actual or potential Potential alteration in comfort, pain Goals: Patient will verbalize adequate pain control and receive pain control interventions during procedures as needed Date Initiated: 09/05/2016 Target Resolution Date: 10/24/2016 Goal Status: Active Patient/caregiver will verbalize adequate pain control between visits Date Initiated: 09/05/2016 Target Resolution Date: 10/24/2016 Goal Status: Active Patient/caregiver will verbalize comfort level met Date Initiated: 09/05/2016 Target Resolution Date: 10/24/2016 Goal Status: Active Interventions: Haring, Assata Gray. ([id number]) Assess comfort goal upon admission Complete pain assessment as per visit requirements Notes: ` Wound/Skin Impairment Nursing Diagnoses: Impaired tissue integrity Knowledge deficit related to smoking impact on wound healing Knowledge deficit related to ulceration/compromised skin integrity Goals: Ulcer/skin breakdown will have a volume reduction of 30% by week 4 Date Initiated: 09/05/2016 Target Resolution Date: 10/24/2016 Goal Status: Active Ulcer/skin breakdown will have a volume reduction of 50% by week 8 Date Initiated: 09/05/2016 Target Resolution Date: 10/24/2016 Goal Status: Active Ulcer/skin breakdown will have a volume reduction of 80% by week 12 Date Initiated: 09/05/2016 Target Resolution Date: 10/24/2016 Goal Status: Active Interventions: Assess patient/caregiver ability to perform ulcer/skin care regimen upon admission and as needed Assess ulceration(s) every visit Provide education on smoking Notes: Electronic Signature(s) Signed: 10/17/2016 4:53:16 PM By: Montey Hora Entered By: Montey Hora on 10/17/2016 14:02:10 [name], [name]  ([id number]) -------------------------------------------------------------------------------- Pain Assessment Details Patient Name: Cynthia Aquas Gray. Date of Service: 10/17/2016 1:30 PM Medical Record Number: [id number] Patient Account Number: [id number] Date of Birth/Sex: 1961-02-01 (56 y.o. Female) Treating RN: Montey Hora Primary Care Deliah Strehlow: Freddy Finner Other Clinician: Referring Thaddeaus Monica: Freddy Finner Treating Kyeshia Zinn/Extender: Frann Rider in Treatment: 6 Active Problems Location of Pain Severity and Description of Pain Patient Has Paino Yes Site Locations Pain Location: Pain in Ulcers With Dressing Change: Yes Duration of the Pain. Constant / Intermittento Constant Pain Management and Medication Current Pain Management: Notes Topical or injectable lidocaine is offered to patient for acute pain when surgical debridement is performed. If needed, Patient is instructed to use over the counter pain medication for the following 24-48 hours after debridement. Wound care MDs do not prescribed pain medications. Patient has chronic pain or uncontrolled pain. Patient has been instructed to make an appointment with their Primary Care Physician for pain management. Electronic Signature(s) Signed: 10/17/2016 4:53:16 PM By: Montey Hora Entered By: Montey Hora on 10/17/2016 13:48:57 [name], [name] ([id number]) -------------------------------------------------------------------------------- Patient/Caregiver Education Details Patient Name: [name] Date of Service: 10/17/2016 1:30 PM Medical Record Number: [id number] Patient Account Number: [id number] Date of Birth/Gender: 1961-04-08 (56 y.o. Female) Treating RN: Montey Hora Primary Care Physician: Freddy Finner Other Clinician: Referring Physician: Freddy Finner Treating Physician/Extender: Frann Rider in Treatment: 6 Education Assessment Education Provided To: Patient and Caregiver Education  Topics Provided Wound/Skin Impairment: Handouts: Other: wound care as  ordered Methods: Demonstration, Explain/Verbal Responses: State content correctly Electronic Signature(s) Signed: 10/17/2016 4:53:16 PM By: Montey Hora Entered By: Montey Hora on 10/17/2016 14:04:37 [name], Cynthia CMarland Kitchen ([id number]) -------------------------------------------------------------------------------- Wound Assessment Details Patient Name: [name], Kassie Gray. Date of Service: 10/17/2016 1:30 PM Medical Record Number: [id number] Patient Account Number: [id number] Date of Birth/Sex: February 28, 1961 (56 y.o. Female) Treating RN: Montey Hora Primary Care Leen Tworek: Freddy Finner Other Clinician: Referring Kenyada Dosch: Freddy Finner Treating Kacey Dysert/Extender: Frann Rider in Treatment: 6 Wound Status Wound Number: 1 Primary Diabetic Wound/Ulcer of the Lower Etiology: Extremity Wound Location: Right Lower Leg - Posterior Wound Open Wounding Event: Gradually Appeared Status: Date Acquired: 01/05/2016 Comorbid Anemia, Chronic Obstructive Pulmonary Weeks Of Treatment: 6 History: Disease (COPD), Sleep Apnea, Clustered Wound: No Congestive Heart Failure, Coronary Artery Disease, Hypertension, Peripheral Venous Disease, Type II Diabetes, Osteoarthritis, Neuropathy Photos Wound Measurements Length: (cm) 0.9 Width: (cm) 2.9 Depth: (cm) 0.3 Area: (cm) 2.05 Volume: (cm) 0.615 % Reduction in Area: -93.4% % Reduction in Volume: -480.2% Epithelialization: None Tunneling: No Undermining: No Wound Description Classification: Grade 1 Foul Odor Aft Wound Margin: Distinct, outline attached Exudate Amount: Large Exudate Type: Serous Exudate Color: amber er Cleansing: No Wound Bed Granulation Amount: Small (1-33%) Exposed Structure Granulation Quality: Pink Fascia Exposed: No Caylor, Brittnye Gray. ([id number]) Necrotic Amount: Large (67-100%) Fat Layer (Subcutaneous Tissue) Exposed: No Necrotic Quality:  Adherent Slough Tendon Exposed: No Muscle Exposed: No Joint Exposed: No Bone Exposed: No Limited to Skin Breakdown Periwound Skin Texture Texture Color No Abnormalities Noted: No No Abnormalities Noted: No Callus: No Atrophie Blanche: No Crepitus: No Cyanosis: No Excoriation: No Ecchymosis: No Induration: No Erythema: Yes Rash: No Erythema Location: Circumferential Scarring: No Hemosiderin Staining: No Mottled: No Moisture Pallor: No No Abnormalities Noted: No Rubor: No Dry / Scaly: No Maceration: No Temperature / Pain Temperature: No Abnormality Tenderness on Palpation: Yes Wound Preparation Ulcer Cleansing: Rinsed/Irrigated with Saline Topical Anesthetic Applied: Other: lidocaine 4%, Treatment Notes Wound #1 (Right, Posterior Lower Leg) 1. Cleansed with: Clean wound with Normal Saline 2. Anesthetic Topical Lidocaine 4% cream to wound bed prior to debridement 4. Dressing Applied: Santyl Ointment 5. Secondary Dressing Applied Dry [place] Signature(s) Signed: 10/17/2016 4:53:16 PM By: Montey Hora Entered By: Montey Hora on 10/17/2016 14:00:34 Torti, [name] ([id number]) -------------------------------------------------------------------------------- Wound Assessment Details Patient Name: [name], Ahjanae Gray. Date of Service: 10/17/2016 1:30 PM Medical Record Number: [id number] Patient Account Number: [id number] Date of Birth/Sex: [birth year] (56 y.o. Female) Treating RN: Montey Hora Primary Care Cybil Senegal: Freddy Finner Other Clinician: Referring Kaniyah Lisby: Freddy Finner Treating Ida Milbrath/Extender: Frann Rider in Treatment: 6 Wound Status Wound Number: 2 Primary Venous Leg Ulcer Etiology: Wound Location: Right Lower Leg - Posterior, Distal Wound Open Status: Wounding Event: Trauma Comorbid Anemia, Chronic Obstructive Pulmonary Date Acquired: 10/05/2016 History: Disease (COPD), Sleep Apnea, Weeks Of Treatment:  1 Congestive Heart Failure, Coronary Clustered Wound: No Artery Disease, Hypertension, Peripheral Venous Disease, Type II Diabetes, Osteoarthritis, Neuropathy Photos Wound Measurements Length: (cm) 1 Width: (cm) 0.3 Depth: (cm) 0.1 Area: (cm) 0.236 Volume: (cm) 0.024 % Reduction in Area: 8.9% % Reduction in Volume: 7.7% Epithelialization: None Tunneling: No Undermining: No Wound Description Full Thickness Without Foul Odor Afte Classification: Exposed Support Structures Diabetic Severity Grade 1 (Wagner): Wound Margin: Flat and Intact Exudate Amount: Large Exudate Type: Serous Exudate Color: amber [place], Charlestine Gray. ([id number]) r Cleansing: No Wound Bed Granulation Amount: None Present (0%) Exposed Structure Necrotic Amount: Large (67-100%) Fascia Exposed: No Necrotic Quality: Eschar, Adherent Slough Fat Layer (  Subcutaneous Tissue) Exposed: No Tendon Exposed: No Muscle Exposed: No Joint Exposed: No Bone Exposed: No Limited to Skin Breakdown Periwound Skin Texture Texture Color No Abnormalities Noted: No No Abnormalities Noted: No Callus: No Atrophie Blanche: No Crepitus: No Cyanosis: No Excoriation: No Ecchymosis: No Induration: No Erythema: No Rash: No Hemosiderin Staining: No Scarring: No Mottled: No Pallor: No Moisture Rubor: No No Abnormalities Noted: No Dry / Scaly: No Temperature / Pain Maceration: No Temperature: No Abnormality Tenderness on Palpation: Yes Wound Preparation Ulcer Cleansing: Rinsed/Irrigated with Saline Topical Anesthetic Applied: Other: lidocaine 4%, Treatment Notes Wound #2 (Right, Distal, Posterior Lower Leg) 1. Cleansed with: Clean wound with Normal Saline 2. Anesthetic Topical Lidocaine 4% cream to wound bed prior to debridement 4. Dressing Applied: Santyl Ointment 5. Secondary Dressing Applied Dry [place] Signature(s) Signed: 10/17/2016 4:53:16 PM By: Montey Hora Entered By:  Montey Hora on 10/17/2016 14:01:01 Channing, [name] ([id number]) -------------------------------------------------------------------------------- [place] Details Patient Name: Cynthia Aquas Gray. Date of Service: 10/17/2016 1:30 PM Medical Record Number: [id number] Patient Account Number: [id number] Date of Birth/Sex: 07/10/1961 (56 y.o. Female) Treating RN: Montey Hora Primary Care Chike Farrington: Freddy Finner Other Clinician: Referring Hailyn Zarr: Freddy Finner Treating Sherrina Zaugg/Extender: Frann Rider in Treatment: 6 Vital Signs Time Taken: 13:49 Temperature (F): 98.2 Height (in): 62 Pulse (bpm): 90 Weight (lbs): 276 Respiratory Rate (breaths/min): 18 Body Mass Index (BMI): 50.5 Blood Pressure (mmHg): 129/73 Reference Range: 80 - 120 mg / dl Electronic Signature(s) Signed: 10/17/2016 4:53:16 PM By: Montey Hora Entered By: Montey Hora on 10/17/2016 13:51:05

## 2016-10-20 ENCOUNTER — Ambulatory Visit (INDEPENDENT_AMBULATORY_CARE_PROVIDER_SITE_OTHER): Payer: Medicaid Other | Admitting: Vascular Surgery

## 2016-10-20 ENCOUNTER — Other Ambulatory Visit (INDEPENDENT_AMBULATORY_CARE_PROVIDER_SITE_OTHER): Payer: Self-pay | Admitting: Vascular Surgery

## 2016-10-20 ENCOUNTER — Encounter (INDEPENDENT_AMBULATORY_CARE_PROVIDER_SITE_OTHER): Payer: Self-pay | Admitting: Vascular Surgery

## 2016-10-20 ENCOUNTER — Other Ambulatory Visit (INDEPENDENT_AMBULATORY_CARE_PROVIDER_SITE_OTHER): Payer: Medicaid Other

## 2016-10-20 VITALS — BP 118/75 | HR 84 | Resp 17 | Ht 62.0 in | Wt 273.0 lb

## 2016-10-20 DIAGNOSIS — F17200 Nicotine dependence, unspecified, uncomplicated: Secondary | ICD-10-CM | POA: Diagnosis not present

## 2016-10-20 DIAGNOSIS — E118 Type 2 diabetes mellitus with unspecified complications: Secondary | ICD-10-CM | POA: Diagnosis not present

## 2016-10-20 DIAGNOSIS — Z48812 Encounter for surgical aftercare following surgery on the circulatory system: Secondary | ICD-10-CM

## 2016-10-20 DIAGNOSIS — I739 Peripheral vascular disease, unspecified: Secondary | ICD-10-CM | POA: Insufficient documentation

## 2016-10-20 DIAGNOSIS — I7025 Atherosclerosis of native arteries of other extremities with ulceration: Secondary | ICD-10-CM | POA: Diagnosis not present

## 2016-10-20 NOTE — Progress Notes (Signed)
Subjective:    Patient ID: [name], female    DOB: Jan 01, 1961, 56 y.o.   MRN: [id number] Chief Complaint  Patient presents with  . Re-evaluation    Ultrasound follow up   Patient presents for her first post-procedure follow up. She is s/p RLE angiogram on 09/22/16. She is receiving wound care for an ulceration to her right calf by Dr. Meyer Russel. Patient is complaining of a painful right big toe. She is scheduled to see her PCP this afternoon. The patient underwent an ABI which show bilateral ankle-brachial indices that suggest no significant lower extremity arterial disease. The patient denies any claudication like symptoms, rest pain or ulcers to her feet / toes.    Review of Systems  Constitutional: Negative.   HENT: Negative.   Eyes: Negative.   Respiratory: Negative.   Cardiovascular: Negative.   Gastrointestinal: Negative.   Endocrine: Negative.   Genitourinary: Negative.   Musculoskeletal: Negative.   Skin:       Right calf wound.  Allergic/Immunologic: Negative.   Neurological: Negative.   Hematological: Negative.   Psychiatric/Behavioral: Negative.       Objective:   Physical Exam  BP 118/75 (BP Location: Right Arm)   Pulse 84   Resp 17   Ht 5\' 2"  (1.575 m)   Wt 273 lb (123.8 kg)   BMI 49.93 kg/m   Past Medical History:  Diagnosis Date  . Anemia   . Anxiety   . Arthritis   . CHF (congestive heart failure) (HCC)   . COPD (chronic obstructive pulmonary disease) (HCC)   . Coronary artery disease   . Diabetes mellitus without complication (HCC)   . Fibromyalgia   . GERD (gastroesophageal reflux disease)   . Hypertension   . Peripheral vascular disease (HCC)   . Sleep apnea   . Stroke [facility])    Social History   Social History  . Marital status: Divorced    Spouse name: N/A  . Number of children: N/A  . Years of education: N/A   Occupational History  . Not on file.   Social History Main Topics  . Smoking status: Current Some Day Smoker   Packs/day: 0.25    Years: 38.00    Types: Cigarettes  . Smokeless tobacco: Current User  . Alcohol use No  . Drug use: No  . Sexual activity: Not on file   Other Topics Concern  . Not on file   Social History Narrative  . No narrative on file   Past Surgical History:  Procedure Laterality Date  . CESAREAN SECTION    . CORONARY ANGIOPLASTY WITH STENT PLACEMENT Left   . DILATION AND CURETTAGE OF UTERUS    . ENDARTERECTOMY Left 01/29/2015   Procedure: ENDARTERECTOMY CAROTID;  Surgeon: Annice Needy, MD;  Location: [facility] ORS;  Service: Vascular;  Laterality: Left;  . PERIPHERAL VASCULAR CATHETERIZATION Right 05/27/2015   Procedure: Lower Extremity Angiography;  Surgeon: Annice Needy, MD;  Location: [facility] INVASIVE CV [facility];  Service: Cardiovascular;  Laterality: Right;  . PERIPHERAL VASCULAR CATHETERIZATION  05/27/2015   Procedure: Lower Extremity Intervention;  Surgeon: Annice Needy, MD;  Location: [facility] INV[facility] CV [facility];  Service: Cardiovascular;;  . PERIPHERAL VASCULAR CATHETERIZATION Right 09/22/2016   Procedure: Lower Extremity Angiography;  Surgeon: Annice Needy, MD;  Location: [facility] IN[facility] CV [facility];  Service: Cardiovascular;  Laterality: Right;  . TUBAL LIGATION     No family history on file.  Allergies  Allergen Reactions  . Niacin And  Related Nausea And Vomiting      Assessment & Plan:  Patient presents for her first post-procedure follow up. She is s/p RLE angiogram on 09/22/16. She is receiving wound care for an ulceration to her right calf by Dr. Meyer Russel. Patient is complaining of a painful right big toe. She is scheduled to see her PCP this afternoon. The patient underwent an ABI which show bilateral ankle-brachial indices that suggest no significant lower extremity arterial disease. The patient denies any claudication like symptoms, rest pain or ulcers to her feet / toes.   1. PAD (peripheral artery disease) (HCC) -Improvement Improvement in symptoms. Improvement in ABI. Patient to  follow up in three months. Patient to continue medical optimization with ASA and dyslipidemia medication. I have discussed with the patient at length the risk factors for and pathogenesis of atherosclerotic disease and encouraged a healthy diet, regular exercise regimen and blood pressure / glucose control.  The patient was encouraged to call the office in the interim if she experiences any claudication like symptoms, rest pain or new ulcers to her feet / toes.  - VAS [place] ABI WITH/WO TBI; Future - VAS [place] LOWER EXTREMITY ARTERIAL DUPLEX; Future  2. Tobacco use disorder - Stable I have discussed (approximately 5 minutes) with the patient the role of tobacco in the pathogenesis of atherosclerosis and its effect on the progression of the disease, impact on the durability of interventions and its limitations on the formation of collateral pathways. I have recommended absolute tobacco cessation. I have discussed various options available for assistance with tobacco cessation including over the counter methods (Nicotine gum, patch and lozenges). We also discussed prescription options (Chantix, Nicotine Inhaler / Nasal Spray). The patient is not interested in pursuing any prescription tobacco cessation options at this time. The patient voices their understanding.   3. Type 2 diabetes mellitus with complication, unspecified long term insulin use status (HCC) - Stable Encouraged good control as its slows the progression of atherosclerotic disease  Current Outpatient Prescriptions on File Prior to Visit  Medication Sig Dispense Refill  . aspirin EC 81 MG tablet Take 81 mg by mouth daily.    Marland Kitchen buPROPion (WELLBUTRIN SR) 150 MG 12 hr tablet TAKE 1 TABLET BY MOUTH 3 TIMES A DAY FOR 3 DAYS THEN 1 TABLET TWICE A DAY AFTER  6  . carvedilol (COREG) 3.125 MG tablet Take 3.125 mg by mouth 2 (two) times daily with a meal.     . clopidogrel (PLAVIX) 75 MG tablet Take 75 mg by mouth daily.    . enalapril (VASOTEC) 10 MG  tablet 1 BY MOUTH DAILY FOR HIGH BLOOD PRESSURE    . fexofenadine (ALLEGRA) 180 MG tablet Take by mouth.    . furosemide (LASIX) 40 MG tablet Take 40 mg by mouth daily.    Marland Kitchen gabapentin (NEURONTIN) 300 MG capsule Take 300 mg by mouth daily as needed (for nerve pain).     Marland Kitchen ibuprofen (ADVIL,MOTRIN) 200 MG tablet Take 200 mg by mouth every 6 (six) hours as needed.    . insulin regular (NOVOLIN R,HUMULIN R) 250 units/2.76mL (100 units/mL) injection Inject 50-100 Units into the skin 2 (two) times daily before a meal. Inject 100 units before breakfast and 50 units before supper.    . metFORMIN (GLUCOPHAGE) 1000 MG tablet Take 1,000 mg by mouth 2 (two) times daily with a meal.    . pantoprazole (PROTONIX) 40 MG tablet Take 40 mg by mouth daily.    . sertraline (ZOLOFT)  100 MG tablet Take 150 mg by mouth daily.     . simvastatin (ZOCOR) 40 MG tablet Take 40 mg by mouth daily.     . traMADol (ULTRAM) 50 MG tablet Take 1 tablet (50 mg total) by mouth every 6 (six) hours as needed. 10 tablet 0  . traZODone (DESYREL) 50 MG tablet Take 50 mg by mouth at bedtime.     No current facility-administered medications on file prior to visit.     There are no Patient Instructions on file for this visit. No Follow-up on file.   Leonid Manus A Briellah Baik, PA-C

## 2016-10-24 ENCOUNTER — Encounter: Payer: Medicaid Other | Admitting: Surgery

## 2016-10-24 DIAGNOSIS — E11622 Type 2 diabetes mellitus with other skin ulcer: Secondary | ICD-10-CM | POA: Diagnosis not present

## 2016-10-25 NOTE — Progress Notes (Signed)
[name], [name] ([id number]) Visit Report for 10/24/2016 Arrival Information Details Patient Name: [name], [name]. Date of Service: 10/24/2016 12:30 PM Medical Record Number: [id number] Patient Account Number: [id number] Date of Birth/Sex: 1961/04/17 (56 y.o. Female) Treating RN: Montey Hora Primary Care Sheily Lineman: Freddy Finner Other Clinician: Referring Viktoriya Glaspy: Freddy Finner Treating Claudie Brickhouse/Extender: Frann Rider in Treatment: 7 Visit Information History Since Last Visit Added or deleted any medications: No Patient Arrived: Ambulatory Any new allergies or adverse reactions: No Arrival Time: 12:34 Had a fall or experienced change in No Accompanied By: dtr activities of daily living that may affect Transfer Assistance: None risk of falls: Patient Identification Verified: Yes Signs or symptoms of abuse/neglect since last No Secondary Verification Process Yes visito Completed: Hospitalized since last visit: No Patient Requires Transmission- No Has Dressing in Place as Prescribed: Yes Based Precautions: Pain Present Now: Yes Patient Has Alerts: Yes Patient Alerts: Patient on Blood Thinner DM II Plavix Electronic Signature(s) Signed: 10/24/2016 4:46:17 PM By: Montey Hora Entered By: Montey Hora on 10/24/2016 12:36:01 Mascaro, [name] ([id number]) -------------------------------------------------------------------------------- Clinic Level of Care Assessment Details Patient Name: Cynthia Aquas C. Date of Service: 10/24/2016 12:30 PM Medical Record Number: [id number] Patient Account Number: [id number] Date of Birth/Sex: March 19, 1961 (56 y.o. Female) Treating RN: Montey Hora Primary Care Marlen [name]: Freddy Finner Other Clinician: Referring Darrik Richman: Freddy Finner Treating Radek Carnero/Extender: Frann Rider in Treatment: 7 Clinic Level of Care Assessment Items TOOL 4 Quantity Score _0  - Use when only an EandM is performed on FOLLOW-UP visit 0 ASSESSMENTS -  Nursing Assessment / Reassessment X - Reassessment of Co-morbidities (includes updates in patient status) 1 10 X - Reassessment of Adherence to Treatment Plan 1 5 ASSESSMENTS - Wound and Skin Assessment / Reassessment _1  - Simple Wound Assessment / Reassessment - one wound 0 X - Complex Wound Assessment / Reassessment - multiple wounds 2 5 _2  - Dermatologic / Skin Assessment (not related to wound area) 0 ASSESSMENTS - Focused Assessment _3  - Circumferential Edema Measurements - multi extremities 0 _4  - Nutritional Assessment / Counseling / Intervention 0 X - Lower Extremity Assessment (monofilament, tuning fork, pulses) 1 5 _5  - Peripheral Arterial Disease Assessment (using hand held doppler) 0 ASSESSMENTS - Ostomy and/or Continence Assessment and Care _6  - Incontinence Assessment and Management 0 _7  - Ostomy Care Assessment and Management (repouching, etc.) 0 PROCESS - Coordination of Care X - Simple Patient / Family Education for ongoing care 1 15 _8  - Complex (extensive) Patient / Family Education for ongoing care 0 _9  - Staff obtains Programmer, systems, Records, Test Results / Process Orders 0 _10  - Staff telephones HHA, Nursing Homes / Clarify orders / etc 0 _11  - Routine Transfer to another Facility (non-emergent condition) 0 [name], Cynthia C. ([id number]) _12  - Routine Hospital Admission (non-emergent condition) 0 _13  - New Admissions / Biomedical engineer / Ordering NPWT, Apligraf, etc. 0 _14  - Emergency Hospital Admission (emergent condition) 0 X - Simple Discharge Coordination 1 10 _15  - Complex (extensive) Discharge Coordination 0 PROCESS - Special Needs _16  - Pediatric / Minor Patient Management 0 _17  - Isolation Patient Management 0 _18  - Hearing / Language / Visual special needs 0 _19  - Assessment of Community assistance (transportation, D/C planning, etc.) 0 _20  - Additional assistance / Altered mentation 0 _21  - Support Surface(s) Assessment (bed, cushion, seat, etc.) 0 INTERVENTIONS -  Wound Cleansing / Measurement _22  - Simple Wound Cleansing - one wound 0 X - Complex Wound Cleansing - multiple wounds 2 5 X - Wound Imaging (  photographs - any number of wounds) 1 5 _0  - Wound Tracing (instead of photographs) 0 _1  - Simple Wound Measurement - one wound 0 X - Complex Wound Measurement - multiple wounds 2 5 INTERVENTIONS - Wound Dressings X - Small Wound Dressing one or multiple wounds 2 10 _2  - Medium Wound Dressing one or multiple wounds 0 _3  - Large Wound Dressing one or multiple wounds 0 X - Application of Medications - topical 1 5 <[id number]>_7<\/[id number]>_5  - Application of Medications - injection 0 INTERVENTIONS - Miscellaneous _5  - External ear exam 0 [name], Cynthia C. ([id number]) _6  - Specimen Collection (cultures, biopsies, blood, body fluids, etc.) 0 _7  - Specimen(s) / Culture(s) sent or taken to Lab for analysis 0 _8  - Patient Transfer (multiple staff / Harrel Lemon Lift / Similar devices) 0 _9  - Simple Staple / Suture removal (25 or less) 0 _10  - Complex Staple / Suture removal (26 or more) 0 _11  - Hypo / Hyperglycemic Management (close monitor of Blood Glucose) 0 _12  - Ankle / Brachial Index (ABI) - do not check if billed separately 0 X - Vital Signs 1 5 Has the patient been seen at the hospital within the last three years: Yes Total Score: 110 Level Of Care: New/Established - Level 3 Electronic Signature(s) Signed: 10/24/2016 4:46:17 PM By: Montey Hora Entered By: Montey Hora on 10/24/2016 13:18:21 [name], [name] ([id number]) -------------------------------------------------------------------------------- Encounter Discharge Information Details Patient Name: Cynthia Aquas C. Date of Service: 10/24/2016 12:30 PM Medical Record Number: [id number] Patient Account Number: [id number] Date of Birth/Sex: [birth year] (56 y.o. Female) Treating RN: Montey Hora Primary Care Anshu Wehner: Freddy Finner Other Clinician: Referring Kehinde Bowdish: Freddy Finner Treating Aleks Nawrot/Extender: Frann Rider in Treatment: 7 Encounter Discharge Information Items Discharge Pain Level: 0 Discharge Condition: Stable Ambulatory Status: Ambulatory Discharge Destination: Home Transportation: Private Auto Accompanied By: dtr Schedule Follow-up Appointment: Yes Medication Reconciliation completed and provided to Patient/Care No Female Minish: Provided on Clinical Summary of Care: 10/24/2016 Form Type Recipient Paper Patient VS Electronic Signature(s) Signed: 10/24/2016 1:31:26 PM By: Ruthine Dose Entered By: Ruthine Dose on 10/24/2016 13:31:26 [name], [name] Kitchen ([id number]) -------------------------------------------------------------------------------- Lower Extremity Assessment Details Patient Name: [name], Cynthia C. Date of Service: 10/24/2016 12:30 PM Medical Record Number: [id number] Patient Account Number: [id number] Date of Birth/Sex: 18-Nov-1960 (56 y.o. Female) Treating RN: Montey Hora Primary Care Manvir Thorson: Freddy Finner Other Clinician: Referring Harve Spradley: Freddy Finner Treating Jayren Cease/Extender: Frann Rider in Treatment: 7 Vascular Assessment Pulses: Dorsalis Pedis Palpable: [Right:Yes] Posterior Tibial Extremity colors, hair growth, and conditions: Extremity Color: [Right:Normal] Hair Growth on Extremity: [Right:No] Temperature of Extremity: [Right:Warm] Capillary Refill: [Right:< 3 seconds] Electronic Signature(s) Signed: 10/24/2016 4:46:17 PM By: Montey Hora Entered By: Montey Hora on 10/24/2016 13:13:21 [name], Cynthia C. ([id number]) -------------------------------------------------------------------------------- Multi Wound Chart Details Patient Name: Cynthia Aquas C. Date of Service: 10/24/2016 12:30 PM Medical Record Number: [id number] Patient Account Number: [id number] Date of Birth/Sex: [birth year] (56 y.o. 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 Comorbid History: Anemia, Chronic Anemia, Chronic N/A Obstructive Pulmonary Obstructive Pulmonary Disease (COPD), Sleep Disease (COPD), Sleep Apnea, Congestive Heart Apnea, Congestive Heart  Failure, Coronary Artery Failure, Coronary Artery Disease, Hypertension, Disease, Hypertension, Peripheral Venous Peripheral Venous Disease, Type II Diabetes, Disease, Type II Diabetes, Osteoarthritis, Neuropathy Osteoarthritis, Neuropathy Date Acquired: 01/05/2016 10/05/2016 N/A Weeks of Treatment: 7 2 N/A Wound Status: Open Open N/A Measurements L x W x D 1x2.7x0.2 1.2x0.4x0.1 N/A (cm) Area (cm) : 2.121 0.377 N/A Volume (cm) : 0.424 0.038 N/A % Reduction in Area: -100.10% -45.60% N/A % Reduction in Volume: -300.00% -46.20% N/A [place], Jordain C. ([id number]) Classification: Grade 1 Full Thickness Without N/A Exposed Support Structures HBO Classification: N/A Grade 1 N/A Exudate Amount: Large Large N/A Exudate Type: Serous Serous N/A Exudate Color: amber amber N/A Wound Margin: Distinct, outline attached Flat and Intact N/A Granulation Amount: Small (1-33%) None Present (0%) N/A Granulation Quality: Pink N/A N/A Necrotic Amount: Large (67-100%) Large (67-100%) N/A Necrotic Tissue: Adherent Slough Eschar, Adherent Slough N/A Exposed Structures: Fascia: No Fascia: No N/A Fat Layer (Subcutaneous Fat Layer (Subcutaneous Tissue) Exposed: No Tissue) Exposed: No Tendon: No Tendon: No Muscle: No Muscle: No Joint: No Joint:  No Bone: No Bone: No Limited to Skin Limited to Skin Breakdown Breakdown Epithelialization: None None N/A Periwound Skin Texture: Excoriation: No Excoriation: No N/A Induration: No Induration: No Callus: No Callus: No Crepitus: No Crepitus: No Rash: No Rash: No Scarring: No Scarring: No Periwound Skin Maceration: No Maceration: No N/A Moisture: Dry/Scaly: No Dry/Scaly: No Periwound Skin Color: Erythema: Yes Atrophie Blanche: No N/A Atrophie Blanche: No Cyanosis: No Cyanosis: No Ecchymosis: No Ecchymosis: No Erythema: No Hemosiderin Staining: No Hemosiderin Staining: No Mottled: No Mottled: No Pallor: No Pallor: No Rubor: No Rubor: No Erythema Location: Circumferential N/A N/A Temperature: No Abnormality No Abnormality N/A Tenderness on Yes Yes N/A Palpation: Wound Preparation: Ulcer Cleansing: Ulcer Cleansing: N/A Rinsed/Irrigated with Rinsed/Irrigated with Saline Saline Topical Anesthetic Topical Anesthetic Applied: Other: lidocaine Applied: Other: lidocaine 4% 4% [name], Cynthia C. ([id number]) Treatment Notes Wound #1 (Right, Posterior Lower Leg) 1. Cleansed with: Clean wound with Normal Saline 2. Anesthetic Topical Lidocaine 4% cream to wound bed prior to debridement 4. Dressing Applied: Santyl Ointment 5. Secondary [place] Wound #2 (Right, Distal, Posterior Lower Leg) 1. Cleansed with: Clean wound with Normal Saline 2. Anesthetic Topical Lidocaine 4% cream to wound bed prior to debridement 4. Dressing Applied: Santyl Ointment 5. Secondary Dressing Applied Dry [place] Signature(s) Signed: 10/24/2016 1:27:17 PM By: Christin Fudge MD, FACS Entered By: Christin Fudge on 10/24/2016 13:27:17 [name] ([id number]) -------------------------------------------------------------------------------- [place] Details Patient Name: [name]. Date of Service: 10/24/2016  12:30 PM Medical Record Number: [id number] Patient Account Number: [id number] Date of Birth/Sex: 07/10/1961 (56 y.o. Female) Treating RN: Montey Hora Primary Care Javious Hallisey: Freddy Finner Other Clinician: Referring Delorise Hunkele: Freddy Finner Treating Eilee Schader/Extender: Frann Rider in Treatment: 7 Active Inactive ` Abuse / Safety / Falls / Self Care Management Nursing Diagnoses: Potential for falls Goals: Patient will remain injury free Date Initiated: 09/05/2016 Target Resolution Date: 10/24/2016 Goal Status: Active Interventions: Assess fall risk on admission and as needed Assess impairment of mobility on admission and as needed per policy Notes: ` Nutrition Nursing Diagnoses: Imbalanced nutrition Impaired glucose control: actual or potential Goals: Patient/caregiver agrees to and verbalizes understanding of need to use nutritional supplements and/or vitamins as prescribed Date Initiated: 09/05/2016 Target Resolution Date: 10/24/2016 Goal Status: Active Patient/caregiver verbalizes understanding of need to maintain therapeutic glucose control per primary care physician Date Initiated: 09/05/2016 Target Resolution Date: 10/24/2016 Goal Status: Active Patient/caregiver  will maintain therapeutic glucose control Date Initiated: 09/05/2016 Target Resolution Date: 10/24/2016 Goal Status: Active AUBRYANA, VITTORIO ([id number]) Interventions: Assess patient nutrition upon admission and as needed per policy Provide education on elevated blood sugars and impact on wound healing Provide education on nutrition Treatment Activities: Education provided on Nutrition : 09/05/2016 Notes: ` Orientation to the Wound Care Program Nursing Diagnoses: Knowledge deficit related to the wound healing center program Goals: Patient/caregiver will verbalize understanding of the [facility] Program Date Initiated: 09/05/2016 Target Resolution Date: 10/24/2016 Goal Status:  Active Interventions: Provide education on orientation to the wound center Notes: ` Pain, Acute or Chronic Nursing Diagnoses: Pain, acute or chronic: actual or potential Potential alteration in comfort, pain Goals: Patient will verbalize adequate pain control and receive pain control interventions during procedures as needed Date Initiated: 09/05/2016 Target Resolution Date: 10/24/2016 Goal Status: Active Patient/caregiver will verbalize adequate pain control between visits Date Initiated: 09/05/2016 Target Resolution Date: 10/24/2016 Goal Status: Active Patient/caregiver will verbalize comfort level met Date Initiated: 09/05/2016 Target Resolution Date: 10/24/2016 Goal Status: Active Interventions: Franks, Joclyn C. ([id number]) Assess comfort goal upon admission Complete pain assessment as per visit requirements Notes: ` Wound/Skin Impairment Nursing Diagnoses: Impaired tissue integrity Knowledge deficit related to smoking impact on wound healing Knowledge deficit related to ulceration/compromised skin integrity Goals: Ulcer/skin breakdown will have a volume reduction of 30% by week 4 Date Initiated: 09/05/2016 Target Resolution Date: 10/24/2016 Goal Status: Active Ulcer/skin breakdown will have a volume reduction of 50% by week 8 Date Initiated: 09/05/2016 Target Resolution Date: 10/24/2016 Goal Status: Active Ulcer/skin breakdown will have a volume reduction of 80% by week 12 Date Initiated: 09/05/2016 Target Resolution Date: 10/24/2016 Goal Status: Active Interventions: Assess patient/caregiver ability to perform ulcer/skin care regimen upon admission and as needed Assess ulceration(s) every visit Provide education on smoking Notes: Electronic Signature(s) Signed: 10/24/2016 4:46:17 PM By: Montey Hora Entered By: Montey Hora on 10/24/2016 13:11:29 [name], [name] ([id number]) -------------------------------------------------------------------------------- Pain  Assessment Details Patient Name: Cynthia Aquas C. Date of Service: 10/24/2016 12:30 PM Medical Record Number: [id number] Patient Account Number: [id number] Date of Birth/Sex: 1961-06-29 (56 y.o. Female) Treating RN: Montey Hora Primary Care Nalanie Winiecki: Freddy Finner Other Clinician: Referring Emonii Wienke: Freddy Finner Treating Ra Pfiester/Extender: Frann Rider in Treatment: 7 Active Problems Location of Pain Severity and Description of Pain Patient Has Paino Yes Site Locations Pain Location: Pain in Ulcers With Dressing Change: Yes Duration of the Pain. Constant / Intermittento Constant Pain Management and Medication Current Pain Management: Notes Topical or injectable lidocaine is offered to patient for acute pain when surgical debridement is performed. If needed, Patient is instructed to use over the counter pain medication for the following 24-48 hours after debridement. Wound care MDs do not prescribed pain medications. Patient has chronic pain or uncontrolled pain. Patient has been instructed to make an appointment with their Primary Care Physician for pain management. Electronic Signature(s) Signed: 10/24/2016 4:46:17 PM By: Montey Hora Entered By: Montey Hora on 10/24/2016 12:36:14 [name], [name] ([id number]) -------------------------------------------------------------------------------- Patient/Caregiver Education Details Patient Name: [name] Date of Service: 10/24/2016 12:30 PM Medical Record Number: [id number] Patient Account Number: [id number] Date of Birth/Gender: 08-22-1961 (56 y.o. Female) Treating RN: Montey Hora Primary Care Physician: Freddy Finner Other Clinician: Referring Physician: Freddy Finner Treating Physician/Extender: Frann Rider in Treatment: 7 Education Assessment Education Provided To: Patient Education Topics Provided Wound/Skin Impairment: Handouts: Other: wound care as ordered Methods: Demonstration,  Explain/Verbal Responses: State content correctly Electronic Signature(s) Signed: 10/24/2016 4:46:17 PM  By: Montey Hora Entered By: Montey Hora on 10/24/2016 13:19:20 Simonich, [name] ([id number]) -------------------------------------------------------------------------------- Wound Assessment Details Patient Name: [name], Aneesah C. Date of Service: 10/24/2016 12:30 PM Medical Record Number: [id number] Patient Account Number: [id number] Date of Birth/Sex: Aug 06, 1961 (56 y.o. Female) Treating RN: Montey Hora Primary Care Myanna Ziesmer: Freddy Finner Other Clinician: Referring Elantra Caprara: Freddy Finner Treating Mariem Skolnick/Extender: Frann Rider in Treatment: 7 Wound Status Wound Number: 1 Primary Diabetic Wound/Ulcer of the Lower Etiology: Extremity Wound Location: Right Lower Leg - Posterior Wound Open Wounding Event: Gradually Appeared Status: Date Acquired: 01/05/2016 Comorbid Anemia, Chronic Obstructive Pulmonary Weeks Of Treatment: 7 History: Disease (COPD), Sleep Apnea, Clustered Wound: No Congestive Heart Failure, Coronary Artery Disease, Hypertension, Peripheral Venous Disease, Type II Diabetes, Osteoarthritis, Neuropathy Photos Wound Measurements Length: (cm) 1 Width: (cm) 2.7 Depth: (cm) 0.2 Area: (cm) 2.121 Volume: (cm) 0.424 % Reduction in Area: -100.1% % Reduction in Volume: -300% Epithelialization: None Tunneling: No Undermining: No Wound Description Classification: Grade 1 Foul Odor Aft Wound Margin: Distinct, outline attached Exudate Amount: Large Exudate Type: Serous Exudate Color: amber er Cleansing: No Wound Bed Granulation Amount: Small (1-33%) Exposed Structure Granulation Quality: Pink Fascia Exposed: No Bickhart, Anishka C. ([id number]) Necrotic Amount: Large (67-100%) Fat Layer (Subcutaneous Tissue) Exposed: No Necrotic Quality: Adherent Slough Tendon Exposed: No Muscle Exposed: No Joint Exposed: No Bone Exposed: No Limited to Skin  Breakdown Periwound Skin Texture Texture Color No Abnormalities Noted: No No Abnormalities Noted: No Callus: No Atrophie Blanche: No Crepitus: No Cyanosis: No Excoriation: No Ecchymosis: No Induration: No Erythema: Yes Rash: No Erythema Location: Circumferential Scarring: No Hemosiderin Staining: No Mottled: No Moisture Pallor: No No Abnormalities Noted: No Rubor: No Dry / Scaly: No Maceration: No Temperature / Pain Temperature: No Abnormality Tenderness on Palpation: Yes Wound Preparation Ulcer Cleansing: Rinsed/Irrigated with Saline Topical Anesthetic Applied: Other: lidocaine 4%, Treatment Notes Wound #1 (Right, Posterior Lower Leg) 1. Cleansed with: Clean wound with Normal Saline 2. Anesthetic Topical Lidocaine 4% cream to wound bed prior to debridement 4. Dressing Applied: Santyl Ointment 5. Secondary Dressing Applied Dry [place] Signature(s) Signed: 10/24/2016 4:46:17 PM By: Montey Hora Entered By: Montey Hora on 10/24/2016 13:10:53 [place], [name] ([id number]) -------------------------------------------------------------------------------- Wound Assessment Details Patient Name: [name], Emya C. Date of Service: 10/24/2016 12:30 PM Medical Record Number: [id number] Patient Account Number: [id number] Date of Birth/Sex: 1961/09/02 (56 y.o. Female) Treating RN: Montey Hora Primary Care [place] Lamons: Freddy Finner Other Clinician: Referring Leilene Diprima: Freddy Finner Treating Sherril Shipman/Extender: Frann Rider in Treatment: 7 Wound Status Wound Number: 2 Primary Venous Leg Ulcer Etiology: Wound Location: Right Lower Leg - Posterior, Distal Wound Open Status: Wounding Event: Trauma Comorbid Anemia, Chronic Obstructive Pulmonary Date Acquired: 10/05/2016 History: Disease (COPD), Sleep Apnea, Weeks Of Treatment: 2 Congestive Heart Failure, Coronary Clustered Wound: No Artery Disease, Hypertension, Peripheral Venous Disease,  Type II Diabetes, Osteoarthritis, Neuropathy Photos Wound Measurements Length: (cm) 1.2 Width: (cm) 0.4 Depth: (cm) 0.1 Area: (cm) 0.377 Volume: (cm) 0.038 % Reduction in Area: -45.6% % Reduction in Volume: -46.2% Epithelialization: None Tunneling: No Undermining: No Wound Description Full Thickness Without Foul Odor Afte Classification: Exposed Support Structures Diabetic Severity Grade 1 (Wagner): Wound Margin: Flat and Intact Exudate Amount: Large Exudate Type: Serous Exudate Color: amber Meints, Christia C. ([id number]) r Cleansing: No Wound Bed Granulation Amount: None Present (0%) Exposed Structure Necrotic Amount: Large (67-100%) Fascia Exposed: No Necrotic Quality: Eschar, Adherent Slough Fat Layer (Subcutaneous Tissue) Exposed: No Tendon Exposed: No Muscle Exposed: No Joint Exposed: No Bone  Exposed: No Limited to Skin Breakdown Periwound Skin Texture Texture Color No Abnormalities Noted: No No Abnormalities Noted: No Callus: No Atrophie Blanche: No Crepitus: No Cyanosis: No Excoriation: No Ecchymosis: No Induration: No Erythema: No Rash: No Hemosiderin Staining: No Scarring: No Mottled: No Pallor: No Moisture Rubor: No No Abnormalities Noted: No Dry / Scaly: No Temperature / Pain Maceration: No Temperature: No Abnormality Tenderness on Palpation: Yes Wound Preparation Ulcer Cleansing: Rinsed/Irrigated with Saline Topical Anesthetic Applied: Other: lidocaine 4%, Treatment Notes Wound #2 (Right, Distal, Posterior Lower Leg) 1. Cleansed with: Clean wound with Normal Saline 2. Anesthetic Topical Lidocaine 4% cream to wound bed prior to debridement 4. Dressing Applied: Santyl Ointment 5. Secondary Dressing Applied Dry [place] Signature(s) Signed: 10/24/2016 4:46:17 PM By: Montey Hora Entered By: Montey Hora on 10/24/2016 13:11:19 [place], [name]  ([id number]) -------------------------------------------------------------------------------- [place] Details Patient Name: Cynthia Aquas C. Date of Service: 10/24/2016 12:30 PM Medical Record Number: [id number] Patient Account Number: [id number] Date of Birth/Sex: May 05, 1961 (56 y.o. Female) Treating RN: Montey Hora Primary Care Nilay Mangrum: Freddy Finner Other Clinician: Referring Kaniyah Lisby: Freddy Finner Treating Genessis Flanary/Extender: Frann Rider in Treatment: 7 Vital Signs Time Taken: 12:36 Temperature (F): 97.9 Height (in): 62 Pulse (bpm): 82 Weight (lbs): 276 Respiratory Rate (breaths/min): 18 Body Mass Index (BMI): 50.5 Blood Pressure (mmHg): 124/63 Reference Range: 80 - 120 mg / dl Electronic Signature(s) Signed: 10/24/2016 4:46:17 PM By: Montey Hora Entered By: Montey Hora on 10/24/2016 12:36:34

## 2016-10-25 NOTE — Progress Notes (Signed)
SHADAYA, MARSCHNER ([id number]) Visit Report for 10/24/2016 Chief Complaint Document Details Patient Name: [name], [name]. Date of Service: 10/24/2016 12:30 PM Medical Record Number: [id number] Patient Account Number: [id number] Date of Birth/Sex: 1960-12-17 (56 y.o. 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 Date of Service: 10/24/2016 12:30 PM Medical Record Number: [id number] Patient Account Number: [id number] Date of Birth/Sex: [birth year] (56 y.o. Female) Treating RN: Curtis Sites Primary Care Provider: Sandrea Hughs Other Clinician: Referring Provider: Sandrea Hughs Treating Provider/Extender: Rudene Re in Treatment: 7 History of Present Illness Location: right posterior calf Quality: Patient reports experiencing a sharp pain to affected area(s). Severity: Patient states wound are getting worse. Duration: Patient has had the wound for > 8 months prior to seeking treatment at the wound center Timing: Pain in wound is constant (hurts all the time) Context: The wound appeared gradually over time Modifying Factors: Other treatment(s) tried include:several courses of antibiotics have been tried including at the ER recently Associated Signs and Symptoms: Patient reports having increase swelling. HPI Description: 56 year old patient was  seen about a month ago in the ER with a chronic wound to her right posterior calf which she had for 3 months. after workup a CT was also done and this showed cellulitis without a discrete drainable abscess and no evidence of mild fasciitis or pyomyositis. There Was also no septic arthritis or osteomyelitis. She was given injectable clindamycin and discharged home on oral clindamycin at that stage. Past medical history of diabetes mellitus, anemia, CHF, COPD, peripheral vascular disease and sleep apnea. She is known to have a carotid artery obstruction. she is status post coronary angioplasty with stents, carotid endarterectomy in 2016 on the left side by Dr. dew, right lower extremity angiography by Dr. dew in August 2016 with intervention. On review of the notes from her 05/27/2015 procedure by Dr. Wyn Quaker showed that she had peripheral artery disease with claudication bilateral lower extremity right greater than left. At that stage he did a right lower extremity angiogram with percutaneous angioplasty of the proximal and mid SFA, distal SFA and above- knee popliteal artery and placement of a stent in the mid to distal SFA and proximal popliteal artery. She smokes and has been doing this for several years. 10/03/2016 -- on 09/22/2016 and she had a procedure done by Dr. Wyn Quaker with aortogram and selective right lower extremity angiogram and placement of a stent after an angioplasty of the superficial femoral artery. There was near occlusion of the stenosis in the previously placed SFA stent. The popliteal artery is less than 30% stenosis. There was a two-vessel runoff distally with an anterior tibial artery and peroneal artery without stenosis. 10/10/2016 -- the patient was seen in the emergency department at [facility], on 10/04/2016, for a right toe pain and was treated for cellulitis of the right big toe. X-ray done was normal and was asked to see the podiatrist and the patient was discharged  home on Augmentin. x-ray of the right toes -- FINDINGS: Marginal osteophytosis noted at the IP and  DIP joints. Joint spaces are preserved. No acute fracture or dislocation. No radiopaque foreign body. No erosive change, evidence of soft tissue defect, or dissecting subcutaneous gas. The patient was also seen by Dr. Gwyneth Revels on 10/06/2016 JOSY, PEADEN ([id number]) 10/24/2016 -- the patient was seen by her PCP and workup has been done for possible gout of her right big toe. She was also seen and Dr. Driscilla Grammes office by Ms. Stegmayer -- bilateral ABI was done which showed no significant lower extremity arterial disease. there was also improvement in her ABI and symptoms. her right ABI was 1.03 on the left was 1.00. The toe brachial indices on the right was 0.38 and on the left was 0.54. She was to be followed up in 3 months. Electronic Signature(s) Signed: 10/24/2016 1:32:04 PM By: Evlyn Kanner MD, FACS Previous Signature: 10/24/2016 1:28:00 PM Version By: Evlyn Kanner MD, FACS Entered By: Evlyn Kanner on 10/24/2016 13:32:04 [name] ([id number]) -------------------------------------------------------------------------------- Physical Exam Details Patient Name: [name], Cynthia C. Date of Service: 10/24/2016 12:30 PM Medical Record Number: [id number] Patient Account Number: [id number] Date of Birth/Sex: 1961/06/18 (56 y.o. Female) Treating RN: Curtis Sites Primary Care Provider: Sandrea Hughs Other Clinician: Referring Provider: Sandrea Hughs Treating Provider/Extender: Rudene Re in Treatment: 7 Constitutional . Pulse regular. Respirations normal and unlabored. Afebrile. . Eyes Nonicteric. Reactive to light. Ears, Nose, Mouth, and Throat Lips, teeth, and gums WNL.Marland Kitchen Moist mucosa without lesions. Neck supple and nontender. No palpable supraclavicular or cervical adenopathy. Normal sized without goiter. Respiratory WNL. No retractions.. Breath sounds WNL, No rubs,  rales, rhonchi, or wheeze.. Cardiovascular Heart rhythm and rate regular, no murmur or gallop.. Pedal Pulses WNL. No clubbing, cyanosis or edema. Chest Breasts symmetical and no nipple discharge.. Breast tissue WNL, no masses, lumps, or tenderness.. Lymphatic No adneopathy. No adenopathy. No adenopathy. Musculoskeletal Adexa without tenderness or enlargement.. Digits and nails w/o clubbing, cyanosis, infection, petechiae, ischemia, or inflammatory conditions.. Integumentary (Hair, Skin) No suspicious lesions. No crepitus or fluctuance. No peri-wound warmth or erythema. No masses.Marland Kitchen Psychiatric Judgement and insight Intact.. No evidence of depression, anxiety, or agitation.. Notes the patient has significant debris both wounds on her right calf but she will not consent to debridement as it is too painful. Her right big toe continues to show signs and symptoms of gout Electronic Signature(s) Signed: 10/24/2016 1:32:39 PM By: Evlyn Kanner MD, FACS Entered By: Evlyn Kanner on 10/24/2016 13:32:38 [name], [name] ([id number]) -------------------------------------------------------------------------------- Physician Orders Details Patient Name: Cynthia Hey C. Date of Service: 10/24/2016 12:30 PM Medical Record Number: [id number] Patient Account Number: [id number] Date of Birth/Sex: 1961-02-01 (56 y.o. Female) Treating RN: Curtis Sites Primary Care Provider: Sandrea Hughs Other Clinician: Referring Provider: Sandrea Hughs Treating Provider/Extender: Rudene Re in Treatment: 7 Verbal / Phone Orders: Yes Clinician: Curtis Sites Read Back and Verified: Yes Diagnosis Coding Wound Cleansing Wound #1 Right,Posterior Lower Leg o Clean wound with Normal Saline. o Cleanse wound with mild soap and water Wound #2 Right,Distal,Posterior Lower Leg o Clean wound with Normal Saline. o Cleanse wound with mild soap and water Anesthetic Wound #1 Right,Posterior Lower Leg o  Topical Lidocaine 4% cream applied to wound bed prior to debridement - for clinic use Wound #2 Right,Distal,Posterior Lower Leg o Topical Lidocaine 4% cream applied to wound bed prior to debridement - for clinic use Skin Barriers/Peri-Wound Care Wound #1 Right,Posterior Lower Leg o Skin Prep Wound #2 Right,Distal,Posterior Lower Leg o Skin Prep Primary Wound Dressing Wound #1 Right,Posterior Lower Leg o Santyl  Ointment Wound #2 Right,Distal,Posterior Lower Leg o Santyl Ointment Secondary Dressing Wound #1 Right,Posterior Lower Leg o Dry Gauze o Other - telfa island Wound #2 Right,Distal,Posterior Lower Leg Cerone, Osha C. ([id number]) o Dry Gauze o Other - telfa island Dressing Change Frequency Wound #1 Right,Posterior Lower Leg o Change dressing every day. Wound #2 Right,Distal,Posterior Lower Leg o Change dressing every day. Follow-up Appointments Wound #1 Right,Posterior Lower Leg o Return Appointment in 1 week. Wound #2 Right,Distal,Posterior Lower Leg o Return Appointment in 1 week. Edema Control Wound #1 Right,Posterior Lower Leg o Elevate legs to the level of the heart and pump ankles as often as possible Wound #2 Right,Distal,Posterior Lower Leg o Elevate legs to the level of the heart and pump ankles as often as possible Additional Orders / Instructions Wound #1 Right,Posterior Lower Leg o Stop Smoking o Increase protein intake. Wound #2 Right,Distal,Posterior Lower Leg o Stop Smoking o Increase protein intake. Medications-please add to medication list. Wound #1 Right,Posterior Lower Leg o Santyl Enzymatic Ointment o Other: - Vitamin C, Zinc, Multivitamin Wound #2 Right,Distal,Posterior Lower Leg o Santyl Enzymatic Ointment o Other: - Vitamin C, Zinc, Multivitamin Electronic Signature(s) Signed: 10/24/2016 4:18:36 PM By: Evlyn Kanner MD, FACS Signed: 10/24/2016 4:46:17 PM By: Trish Fountain, [name]  ([id number]) Entered By: Curtis Sites on 10/24/2016 13:17:42 [name], [name] ([id number]) -------------------------------------------------------------------------------- Problem List Details Patient Name: Gassner, Cloee C. Date of Service: 10/24/2016 12:30 PM Medical Record Number: [id number] Patient Account Number: [id number] Date of Birth/Sex: 11/03/1960 (56 y.o. 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 Date of Service: 10/24/2016 12:30 PM Medical Record Number: [id number] Patient Account Number: [id number] Date of Birth/Sex: 1961-02-16 (56 y.o. 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 Severity: Patient states wound are getting worse. Duration: Patient has had the wound for > 8 months prior to seeking treatment at the wound center Timing: Pain in wound is constant (hurts all the time) Context: The wound appeared gradually over time Modifying Factors: Other treatment(s) tried include:several courses of antibiotics have been tried including at the ER recently Associated Signs and Symptoms: Patient reports having increase swelling. 56 year old patient was seen about a month ago in the ER with a chronic wound to her right posterior calf which she had for 3 months. after workup a CT was also done and this  showed cellulitis without a discrete drainable abscess and no evidence of mild fasciitis or pyomyositis. There Was also no septic arthritis or osteomyelitis. She was given injectable clindamycin and discharged home on oral clindamycin at that stage. Past medical history of diabetes mellitus, anemia, CHF, COPD, peripheral vascular disease and sleep apnea. She is known to have a carotid artery obstruction. she is status post coronary angioplasty with stents, carotid endarterectomy in 2016 on the left side by Dr. dew, right lower extremity angiography by Dr. dew in August 2016 with intervention. On review of the notes from her 05/27/2015 procedure by Dr. Wyn Quaker showed that she had peripheral artery disease with claudication bilateral lower extremity right greater than left. At that stage he did a right lower extremity angiogram with percutaneous angioplasty of the proximal and mid SFA, distal SFA and above- knee popliteal artery and placement of a stent in the mid to distal SFA and proximal popliteal artery. She smokes and has been doing this for  several years. 10/03/2016 -- on 09/22/2016 and she had a procedure done by Dr. Wyn Quaker with aortogram and selective right lower extremity angiogram and placement of a stent after an angioplasty of the superficial femoral artery. There was near occlusion of the stenosis in the previously placed SFA stent. The popliteal artery is less than 30% stenosis. There was a two-vessel runoff distally with an anterior tibial artery and peroneal artery [name], Cynthia C. ([id number]) without stenosis. 10/10/2016 -- the patient was seen in the emergency department at [facility], on 10/04/2016, for a right toe pain and was treated for cellulitis of the right big toe. X-ray done was normal and was asked to see the podiatrist and the patient was discharged home on Augmentin. x-ray of the right toes -- FINDINGS: Marginal osteophytosis noted at the IP and DIP joints. Joint spaces are preserved. No acute fracture or dislocation. No radiopaque foreign body. No erosive change, evidence of soft tissue defect, or dissecting subcutaneous gas. The patient was also seen by Dr. Gwyneth Revels on 10/06/2016 10/24/2016 -- the patient was seen by her PCP and workup has been done for possible gout of her right big toe. She was also seen and Dr. Driscilla Grammes office by Ms. Stegmayer -- bilateral ABI was done which showed no significant lower extremity arterial disease. there was also improvement in her ABI and symptoms. her right ABI was 1.03 on the left was 1.00. The toe brachial indices on the right was 0.38 and on the left was 0.54. She was to be followed up in 3 months. Objective Constitutional Pulse regular. Respirations normal and unlabored. Afebrile. Vitals Time Taken: 12:36 PM, Height: 62 in, Weight: 276 lbs, BMI: 50.5, Temperature: 97.9 F, Pulse: 82 bpm, Respiratory Rate: 18 breaths/min, Blood Pressure: 124/63 mmHg. Eyes Nonicteric. Reactive to light. Ears, Nose, Mouth, and Throat Lips, teeth, and gums WNL.Marland Kitchen Moist mucosa  without lesions. Neck supple and nontender. No palpable supraclavicular or cervical adenopathy. Normal sized without goiter. Respiratory WNL. No retractions.. Breath sounds WNL, No rubs, rales, rhonchi, or wheeze.. Cardiovascular Heart rhythm and rate regular, no murmur or gallop.. Pedal Pulses WNL. No clubbing, cyanosis or edema. Chest Breasts symmetical and no nipple discharge.. Breast tissue WNL, no masses, lumps, or tenderness.. Lymphatic Grandison, Deshannon C. ([id number]) No adneopathy. No adenopathy. No adenopathy. Musculoskeletal Adexa without tenderness or enlargement.. Digits and nails w/o clubbing, cyanosis, infection, petechiae, ischemia, or inflammatory conditions.Marland Kitchen Psychiatric Judgement and insight Intact.. No evidence of depression, anxiety, or agitation.. General Notes: the patient has significant  debris both wounds on her right calf but she will not consent to debridement as it is too painful. Her right big toe continues to show signs and symptoms of gout Integumentary (Hair, Skin) No suspicious lesions. No crepitus or fluctuance. No peri-wound warmth or erythema. No masses.. Wound #1 status is Open. Original cause of wound was Gradually Appeared. The wound is located on the Right,Posterior Lower Leg. The wound measures 1cm length x 2.7cm width x 0.2cm depth; 2.121cm^2 area and 0.424cm^3 volume. The wound is limited to skin breakdown. There is no tunneling or undermining noted. There is a large amount of serous drainage noted. The wound margin is distinct with the outline attached to the wound base. There is small (1-33%) pink granulation within the wound bed. There is a large (67-100%) amount of necrotic tissue within the wound bed including Adherent Slough. The periwound skin appearance exhibited: Erythema. The periwound skin appearance did not exhibit: Callus, Crepitus, Excoriation, Induration, Rash, Scarring, Dry/Scaly, Maceration, Atrophie Blanche, Cyanosis,  Ecchymosis, Hemosiderin Staining, Mottled, Pallor, Rubor. The surrounding wound skin color is noted with erythema which is circumferential. Periwound temperature was noted as No Abnormality. The periwound has tenderness on palpation. Wound #2 status is Open. Original cause of wound was Trauma. The wound is located on the Right,Distal,Posterior Lower Leg. The wound measures 1.2cm length x 0.4cm width x 0.1cm depth; 0.377cm^2 area and 0.038cm^3 volume. The wound is limited to skin breakdown. There is no tunneling or undermining noted. There is a large amount of serous drainage noted. The wound margin is flat and intact. There is no granulation within the wound bed. There is a large (67-100%) amount of necrotic tissue within the wound bed including Eschar and Adherent Slough. The periwound skin appearance did not exhibit: Callus, Crepitus, Excoriation, Induration, Rash, Scarring, Dry/Scaly, Maceration, Atrophie Blanche, Cyanosis, Ecchymosis, Hemosiderin Staining, Mottled, Pallor, Rubor, Erythema. Periwound temperature was noted as No Abnormality. The periwound has tenderness on palpation. 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 ([id number]) E66.01 - Morbid (severe) obesity due to excess calories Plan Wound Cleansing: Wound #1 Right,Posterior Lower Leg: Clean wound with Normal Saline. Cleanse wound with mild soap and water Wound #2 Right,Distal,Posterior Lower Leg: Clean wound with Normal Saline. Cleanse wound with mild soap and water Anesthetic: Wound #1 Right,Posterior Lower Leg: Topical Lidocaine 4% cream applied to wound bed prior to debridement - for clinic use Wound #2  Right,Distal,Posterior Lower Leg: Topical Lidocaine 4% cream applied to wound bed prior to debridement - for clinic use Skin Barriers/Peri-Wound Care: Wound #1 Right,Posterior Lower Leg: Skin Prep Wound #2 Right,Distal,Posterior Lower Leg: Skin Prep Primary Wound Dressing: Wound #1 Right,Posterior Lower Leg: Santyl Ointment Wound #2 Right,Distal,Posterior Lower Leg: Santyl Ointment Secondary Dressing: Wound #1 Right,Posterior Lower Leg: Dry Gauze Other - telfa island Wound #2 Right,Distal,Posterior Lower Leg: Dry Gauze Other - telfa island Dressing Change Frequency: Wound #1 Right,Posterior Lower Leg: Change dressing every day. Wound #2 Right,Distal,Posterior Lower Leg: Change dressing every day. Follow-up Appointments: Wound #1 Right,Posterior Lower Leg: Return Appointment in 1 week. Wound #2 Right,Distal,Posterior Lower Leg: Return Appointment in 1 week. Edema Control: Wound #1 Right,Posterior Lower Leg: [name], Cynthia C. ([id number]) Elevate legs to the level of the heart and pump ankles as often as possible  Wound #2 Right,Distal,Posterior Lower Leg: Elevate legs to the level of the heart and pump ankles as often as possible Additional Orders / Instructions: Wound #1 Right,Posterior Lower Leg: Stop Smoking Increase protein intake. Wound #2 Right,Distal,Posterior Lower Leg: Stop Smoking Increase protein intake. Medications-please add to medication list.: Wound #1 Right,Posterior Lower Leg: Santyl Enzymatic Ointment Other: - Vitamin C, Zinc, Multivitamin Wound #2 Right,Distal,Posterior Lower Leg: Santyl Enzymatic Ointment Other: - Vitamin C, Zinc, Multivitamin The patient will not consent to sharp debridement and I will honor her wishes. After review have recommended: 1. Santyl ointment to be applied to the wound with dressing changes daily. 2. review by her vascular surgeons -- report reviewed 3. she may also see her PCP regarding the possibility of this being  gout 4. Good control of her diabetes mellitus with a recent A1c to be done 5. I'm pleased to learn that she is still off smoking 6. regular visits the wound center -- I have recommended she takes an analgesic before she comes so that I can do a sharp debridement. Electronic Signature(s) Signed: 10/24/2016 1:33:57 PM By: Evlyn Kanner MD, FACS Entered By: Evlyn Kanner on 10/24/2016 13:33:57 [name], [name] ([id number]) -------------------------------------------------------------------------------- SuperBill Details Patient Name: Cynthia Hey C. Date of Service: 10/24/2016 Medical Record Number: [id number] Patient Account Number: [id number] Date of Birth/Sex: 05-07-1961 (56 y.o. 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 F17.218 Nicotine dependence, cigarettes, with other nicotine-induced disorders E66.01 Morbid (severe) obesity due to excess calories Facility Procedures CPT4 Code: 18841660 Description: 99213 - WOUND CARE VISIT-LEV 3 EST PT Modifier: Quantity: 1 Physician Procedures CPT4 Code Description: [id number] 99213 - WC PHYS LEVEL 3 - EST PT ICD-10 Description Diagnosis E11.622 Type 2 diabetes mellitus with other skin ulcer L97.212 Non-pressure chronic ulcer of right calf with fat lay I70.232 Atherosclerosis of native arteries  of right leg with E66.01 Morbid (severe) obesity due to excess calories Modifier: er exposed ulceration of c Quantity: 1 alf Electronic Signature(s) Signed: 10/24/2016 1:34:13 PM By: Evlyn Kanner MD, FACS Entered By: Evlyn Kanner on 10/24/2016 13:34:13

## 2016-10-31 ENCOUNTER — Encounter: Payer: Medicaid Other | Attending: Surgery | Admitting: Surgery

## 2016-10-31 DIAGNOSIS — I110 Hypertensive heart disease with heart failure: Secondary | ICD-10-CM | POA: Insufficient documentation

## 2016-10-31 DIAGNOSIS — F17218 Nicotine dependence, cigarettes, with other nicotine-induced disorders: Secondary | ICD-10-CM | POA: Diagnosis not present

## 2016-10-31 DIAGNOSIS — K219 Gastro-esophageal reflux disease without esophagitis: Secondary | ICD-10-CM | POA: Insufficient documentation

## 2016-10-31 DIAGNOSIS — I70232 Atherosclerosis of native arteries of right leg with ulceration of calf: Secondary | ICD-10-CM | POA: Insufficient documentation

## 2016-10-31 DIAGNOSIS — D649 Anemia, unspecified: Secondary | ICD-10-CM | POA: Diagnosis not present

## 2016-10-31 DIAGNOSIS — L97212 Non-pressure chronic ulcer of right calf with fat layer exposed: Secondary | ICD-10-CM | POA: Insufficient documentation

## 2016-10-31 DIAGNOSIS — M1990 Unspecified osteoarthritis, unspecified site: Secondary | ICD-10-CM | POA: Insufficient documentation

## 2016-10-31 DIAGNOSIS — Z79899 Other long term (current) drug therapy: Secondary | ICD-10-CM | POA: Insufficient documentation

## 2016-10-31 DIAGNOSIS — G4730 Sleep apnea, unspecified: Secondary | ICD-10-CM | POA: Insufficient documentation

## 2016-10-31 DIAGNOSIS — E1140 Type 2 diabetes mellitus with diabetic neuropathy, unspecified: Secondary | ICD-10-CM | POA: Diagnosis not present

## 2016-10-31 DIAGNOSIS — I2510 Atherosclerotic heart disease of native coronary artery without angina pectoris: Secondary | ICD-10-CM | POA: Insufficient documentation

## 2016-10-31 DIAGNOSIS — J449 Chronic obstructive pulmonary disease, unspecified: Secondary | ICD-10-CM | POA: Insufficient documentation

## 2016-10-31 DIAGNOSIS — E11622 Type 2 diabetes mellitus with other skin ulcer: Secondary | ICD-10-CM | POA: Insufficient documentation

## 2016-10-31 DIAGNOSIS — Z794 Long term (current) use of insulin: Secondary | ICD-10-CM | POA: Insufficient documentation

## 2016-10-31 DIAGNOSIS — Z8673 Personal history of transient ischemic attack (TIA), and cerebral infarction without residual deficits: Secondary | ICD-10-CM | POA: Diagnosis not present

## 2016-10-31 DIAGNOSIS — Z6841 Body Mass Index (BMI) 40.0 and over, adult: Secondary | ICD-10-CM | POA: Diagnosis not present

## 2016-10-31 DIAGNOSIS — M797 Fibromyalgia: Secondary | ICD-10-CM | POA: Insufficient documentation

## 2016-10-31 DIAGNOSIS — I509 Heart failure, unspecified: Secondary | ICD-10-CM | POA: Insufficient documentation

## 2016-11-05 NOTE — Progress Notes (Signed)
RIELYN, KRUPINSKI ([id number]) Visit Report for 10/31/2016 Chief Complaint Document Details Patient Name: [name], [name]. Date of Service: 10/31/2016 12:30 PM Medical Record Number: [id number] Patient Account Number: [id number] Date of Birth/Sex: 1961/04/28 (56 y.o. 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 Date of Service: 10/31/2016 12:30 PM Medical Record Number: [id number] Patient Account Number: [id number] Date of Birth/Sex: 21-Mar-1961 (56 y.o. Female) Treating RN: Ashok Cordia, Debi Primary Care Provider: Sandrea Hughs Other Clinician: Referring Provider: Sandrea Hughs Treating Provider/Extender: Rudene Re in Treatment: 8 History of Present Illness Location: right posterior calf Quality: Patient reports experiencing a sharp pain to affected area(s). Severity: Patient states wound are getting worse. Duration: Patient has had the wound for > 8 months prior to seeking treatment at the wound center Timing: Pain in wound is constant (hurts all the time) Context: The wound appeared gradually over time Modifying Factors: Other treatment(s) tried include:several courses of antibiotics have been tried including at the ER recently Associated Signs and Symptoms: Patient reports having increase swelling. HPI Description: 56 year old patient was  seen about a month ago in the ER with a chronic wound to her right posterior calf which she had for 3 months. after workup a CT was also done and this showed cellulitis without a discrete drainable abscess and no evidence of mild fasciitis or pyomyositis. There Was also no septic arthritis or osteomyelitis. She was given injectable clindamycin and discharged home on oral clindamycin at that stage. Past medical history of diabetes mellitus, anemia, CHF, COPD, peripheral vascular disease and sleep apnea. She is known to have a carotid artery obstruction. she is status post coronary angioplasty with stents, carotid endarterectomy in 2016 on the left side by Dr. dew, right lower extremity angiography by Dr. dew in August 2016 with intervention. On review of the notes from her 05/27/2015 procedure by Dr. Wyn Quaker showed that she had peripheral artery disease with claudication bilateral lower extremity right greater than left. At that stage he did a right lower extremity angiogram with percutaneous angioplasty of the proximal and mid SFA, distal SFA and above- knee popliteal artery and placement of a stent in the mid to distal SFA and proximal popliteal artery. She smokes and has been doing this for several years. 10/03/2016 -- on 09/22/2016 and she had a procedure done by Dr. Wyn Quaker with aortogram and selective right lower extremity angiogram and placement of a stent after an angioplasty of the superficial femoral artery. There was near occlusion of the stenosis in the previously placed SFA stent. The popliteal artery is less than 30% stenosis. There was a two-vessel runoff distally with an anterior tibial artery and peroneal artery without stenosis. 10/10/2016 -- the patient was seen in the emergency department at [facility], on 10/04/2016, for a right toe pain and was treated for cellulitis of the right big toe. X-ray done was normal and was asked to see the podiatrist and the patient was discharged  home on Augmentin. x-ray of the right toes -- FINDINGS: Marginal osteophytosis noted at the IP and  DIP joints. Joint spaces are preserved. No acute fracture or dislocation. No radiopaque foreign body. No erosive change, evidence of soft tissue defect, or dissecting subcutaneous gas. The patient was also seen by Dr. Gwyneth Revels on 10/06/2016 KAARI, ZEIGLER ([id number]) 10/24/2016 -- the patient was seen by her PCP and workup has been done for possible gout of her right big toe. She was also seen and Dr. Driscilla Grammes office by Ms. Stegmayer -- bilateral ABI was done which showed no significant lower extremity arterial disease. there was also improvement in her ABI and symptoms. her right ABI was 1.03 on the left was 1.00. The toe brachial indices on the right was 0.38 and on the left was 0.54. She was to be followed up in 3 months. 10/31/2016 -- for several weeks now the patient is not being compliant and will not allow debridement. the threshold of pain is very low and she has anxiety issues and in view of this and asked her to talk to her PCP about referring her to a surgeon possibly for a procedure under IV sedation. Electronic Signature(s) Signed: 10/31/2016 1:27:03 PM By: Evlyn Kanner MD, FACS Entered By: Evlyn Kanner on 10/31/2016 13:27:03 Cynthia School ([id number]) -------------------------------------------------------------------------------- Physical Exam Details Patient Name: Cynthia Hey C. Date of Service: 10/31/2016 12:30 PM Medical Record Number: [id number] Patient Account Number: [id number] Date of Birth/Sex: October 31, 1960 (56 y.o. Female) Treating RN: Ashok Cordia, Debi Primary Care Provider: Sandrea Hughs Other Clinician: Referring Provider: Sandrea Hughs Treating Provider/Extender: Rudene Re in Treatment: 8 Constitutional . Pulse regular. Respirations normal and unlabored. Afebrile. . Eyes Nonicteric. Reactive to light. Ears, Nose, Mouth, and Throat Lips, teeth, and  gums WNL.Marland Kitchen Moist mucosa without lesions. Neck supple and nontender. No palpable supraclavicular or cervical adenopathy. Normal sized without goiter. Respiratory WNL. No retractions.. Breath sounds WNL, No rubs, rales, rhonchi, or wheeze.. Cardiovascular Heart rhythm and rate regular, no murmur or gallop.. Pedal Pulses WNL. No clubbing, cyanosis or edema. Chest Breasts symmetical and no nipple discharge.. Breast tissue WNL, no masses, lumps, or tenderness.. Lymphatic No adneopathy. No adenopathy. No adenopathy. Musculoskeletal Adexa without tenderness or enlargement.. Digits and nails w/o clubbing, cyanosis, infection, petechiae, ischemia, or inflammatory conditions.. Integumentary (Hair, Skin) No suspicious lesions. No crepitus or fluctuance. No peri-wound warmth or erythema. No masses.Marland Kitchen Psychiatric Judgement and insight Intact.. No evidence of depression, anxiety, or agitation.. Notes the patient continues to have significant debris on the wounds of her right calf but her pain threshold is low and her pain is way out of proportion with the physical examination. He will not consent for debridement. Her right big toe continues to show signs and symptoms of gout but she has not seen her PCP yet. Electronic Signature(s) Signed: 10/31/2016 1:28:05 PM By: Evlyn Kanner MD, FACS Entered By: Evlyn Kanner on 10/31/2016 13:28:04 Cynthia School ([id number]) -------------------------------------------------------------------------------- Physician Orders Details Patient Name: Cynthia Hey C. Date of Service: 10/31/2016 12:30 PM Medical Record Number: [id number] Patient Account Number: [id number] Date of Birth/Sex: Mar 15, 1961 (56 y.o. Female) Treating RN: Ashok Cordia, Debi Primary Care Provider: Sandrea Hughs Other Clinician: Referring Provider: Sandrea Hughs Treating Provider/Extender: Rudene Re in Treatment: 8 Verbal / Phone Orders: Yes Clinician: Ashok Cordia, Debi Read Back and  Verified: Yes Diagnosis Coding Wound Cleansing Wound #1 Right,Posterior Lower Leg o Clean wound with Normal Saline. o Cleanse wound with mild soap and water Wound #2 Right,Distal,Posterior Lower Leg o Clean wound with Normal Saline. o Cleanse wound with mild soap and water Anesthetic Wound #1 Right,Posterior Lower  Leg o Topical Lidocaine 4% cream applied to wound bed prior to debridement - for clinic use Wound #2 Right,Distal,Posterior Lower Leg o Topical Lidocaine 4% cream applied to wound bed prior to debridement - for clinic use Skin Barriers/Peri-Wound Care Wound #1 Right,Posterior Lower Leg o Skin Prep Wound #2 Right,Distal,Posterior Lower Leg o Skin Prep Primary Wound Dressing Wound #1 Right,Posterior Lower Leg o Santyl Ointment Wound #2 Right,Distal,Posterior Lower Leg o Santyl Ointment Secondary Dressing Wound #1 Right,Posterior Lower Leg o Dry Gauze o Other - telfa island Wound #2 Right,Distal,Posterior Lower Leg [name], Cynthia C. ([id number]) o Dry Gauze o Other - telfa island Dressing Change Frequency Wound #1 Right,Posterior Lower Leg o Change dressing every day. Wound #2 Right,Distal,Posterior Lower Leg o Change dressing every day. Follow-up Appointments Wound #1 Right,Posterior Lower Leg o Return Appointment in 1 week. Wound #2 Right,Distal,Posterior Lower Leg o Return Appointment in 1 week. Edema Control Wound #1 Right,Posterior Lower Leg o Elevate legs to the level of the heart and pump ankles as often as possible Wound #2 Right,Distal,Posterior Lower Leg o Elevate legs to the level of the heart and pump ankles as often as possible Additional Orders / Instructions Wound #1 Right,Posterior Lower Leg o Stop Smoking o Increase protein intake. Wound #2 Right,Distal,Posterior Lower Leg o Stop Smoking o Increase protein intake. Medications-please add to medication list. Wound #1 Right,Posterior Lower  Leg o Santyl Enzymatic Ointment o Other: - Vitamin C, Zinc, Multivitamin Wound #2 Right,Distal,Posterior Lower Leg o Santyl Enzymatic Ointment o Other: - Vitamin C, Zinc, Multivitamin Electronic Signature(s) Signed: 10/31/2016 4:07:26 PM By: Evlyn Kanner MD, FACS Signed: 11/04/2016 1:35:52 PM By: [place] Morelle, [name] ([id number]) Entered By: Alejandro Mulling on 10/31/2016 13:06:02 [name], [name] ([id number]) -------------------------------------------------------------------------------- Problem List Details Patient Name: Wish, Swayzie C. Date of Service: 10/31/2016 12:30 PM Medical Record Number: [id number] Patient Account Number: [id number] Date of Birth/Sex: [birth year] (56 y.o. 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 Date of Service: 10/31/2016 12:30 PM Medical Record Number: [id number] Patient Account Number: [id number] Date of Birth/Sex: [birth year] (56 y.o. 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 Severity: Patient states wound are getting worse. Duration: Patient has had the wound for > 8 months prior to seeking treatment at the wound center Timing: Pain in wound is constant (hurts all the time) Context:  The wound appeared gradually over time Modifying Factors: Other treatment(s) tried include:several courses of antibiotics have been tried including at the ER recently Associated Signs and Symptoms: Patient reports having increase swelling. 56 year old patient was seen about a month ago in the ER with a chronic wound to her right posterior calf which she had for 3 months. after workup a CT was also done and this showed cellulitis without a discrete drainable abscess and no evidence of mild fasciitis or pyomyositis. There Was also no septic arthritis or osteomyelitis. She was given injectable clindamycin and discharged home on oral clindamycin at that stage. Past medical history of diabetes mellitus, anemia, CHF, COPD, peripheral vascular disease and sleep apnea. She is known to have a carotid artery obstruction. she is status post coronary angioplasty with stents, carotid endarterectomy in 2016 on the left side by Dr. dew, right lower extremity angiography by Dr. dew in August 2016 with intervention. On review of the notes from her 05/27/2015 procedure by Dr. Wyn Quaker showed that she had peripheral artery disease  with claudication bilateral lower extremity right greater than left. At that stage he did a right lower extremity angiogram with percutaneous angioplasty of the proximal and mid SFA, distal SFA and above- knee popliteal artery and placement of a stent in the mid to distal SFA and proximal popliteal artery. She smokes and has been doing this for several years. 10/03/2016 -- on 09/22/2016 and she had a procedure done by Dr. Wyn Quaker with aortogram and selective right lower extremity angiogram and placement of a stent after an angioplasty of the superficial femoral artery. There was near occlusion of the stenosis in the previously placed SFA stent. The popliteal artery is less than 30% stenosis. There was a two-vessel runoff distally with an anterior tibial artery and peroneal artery [name], Cynthia C. ([id number]) without stenosis. 10/10/2016 -- the patient was seen in the emergency department at [facility], on 10/04/2016, for a right toe pain and was treated for cellulitis of the right big toe. X-ray done was normal and was asked to see the podiatrist and the patient was discharged home on Augmentin. x-ray of the right toes -- FINDINGS: Marginal osteophytosis noted at the IP and DIP joints. Joint spaces are preserved. No acute fracture or dislocation. No radiopaque foreign body. No erosive change, evidence of soft tissue defect, or dissecting subcutaneous gas. The patient was also seen by Dr. Gwyneth Revels on 10/06/2016 10/24/2016 -- the patient was seen by her PCP and workup has been done for possible gout of her right big toe. She was also seen and Dr. Driscilla Grammes office by Ms. Stegmayer -- bilateral ABI was done which showed no significant lower extremity arterial disease. there was also improvement in her ABI and symptoms. her right ABI was 1.03 on the left was 1.00. The toe brachial indices on the right was 0.38 and on the left was 0.54. She was to be followed up in 3 months. 10/31/2016 -- for  several weeks now the patient is not being compliant and will not allow debridement. the threshold of pain is very low and she has anxiety issues and in view of this and asked her to talk to her PCP about referring her to a surgeon possibly for a procedure under IV sedation. Objective Constitutional Pulse regular. Respirations normal and unlabored. Afebrile. Vitals Time Taken: 12:37 PM, Height: 62 in, Weight: 276 lbs, BMI: 50.5, Temperature: 98.4 F, Pulse: 89 bpm, Respiratory Rate: 20 breaths/min, Blood Pressure: 170/78 mmHg. Eyes Nonicteric. Reactive to  light. Ears, Nose, Mouth, and Throat Lips, teeth, and gums WNL.Marland Kitchen. Moist mucosa without lesions. Neck supple and nontender. No palpable supraclavicular or cervical adenopathy. Normal sized without goiter. Respiratory WNL. No retractions.. Breath sounds WNL, No rubs, rales, rhonchi, or wheeze.. Cardiovascular Heart rhythm and rate regular, no murmur or gallop.. Pedal Pulses WNL. No clubbing, cyanosis or edema. [name], Cynthia C. ([id number]) Chest Breasts symmetical and no nipple discharge.. Breast tissue WNL, no masses, lumps, or tenderness.. Lymphatic No adneopathy. No adenopathy. No adenopathy. Musculoskeletal Adexa without tenderness or enlargement.. Digits and nails w/o clubbing, cyanosis, infection, petechiae, ischemia, or inflammatory conditions.Marland Kitchen. Psychiatric Judgement and insight Intact.. No evidence of depression, anxiety, or agitation.. General Notes: the patient continues to have significant debris on the wounds of her right calf but her pain threshold is low and her pain is way out of proportion with the physical examination. He will not consent for debridement. Her right big toe continues to show signs and symptoms of gout but she has not seen her PCP yet. Integumentary (Hair, Skin) No suspicious lesions. No crepitus or fluctuance. No peri-wound warmth or erythema. No masses.. Wound #1 status is Open. Original cause of wound  was Gradually Appeared. The wound is located on the Right,Posterior Lower Leg. The wound measures 1.3cm length x 3cm width x 0.1cm depth; 3.063cm^2 area and 0.306cm^3 volume. The wound is limited to skin breakdown. There is no tunneling or undermining noted. There is a large amount of serous drainage noted. The wound margin is distinct with the outline attached to the wound base. There is no granulation within the wound bed. There is a large (67-100%) amount of necrotic tissue within the wound bed including Adherent Slough. The periwound skin appearance exhibited: Erythema. The periwound skin appearance did not exhibit: Callus, Crepitus, Excoriation, Induration, Rash, Scarring, Dry/Scaly, Maceration, Atrophie Blanche, Cyanosis, Ecchymosis, Hemosiderin Staining, Mottled, Pallor, Rubor. The surrounding wound skin color is noted with erythema which is circumferential. Periwound temperature was noted as No Abnormality. The periwound has tenderness on palpation. Wound #2 status is Open. Original cause of wound was Trauma. The wound is located on the Right,Distal,Posterior Lower Leg. The wound measures 1cm length x 0.4cm width x 0.1cm depth; 0.314cm^2 area and 0.031cm^3 volume. The wound is limited to skin breakdown. There is no tunneling or undermining noted. There is a large amount of serous drainage noted. The wound margin is flat and intact. There is no granulation within the wound bed. There is a large (67-100%) amount of necrotic tissue within the wound bed including Eschar and Adherent Slough. The periwound skin appearance did not exhibit: Callus, Crepitus, Excoriation, Induration, Rash, Scarring, Dry/Scaly, Maceration, Atrophie Blanche, Cyanosis, Ecchymosis, Hemosiderin Staining, Mottled, Pallor, Rubor, Erythema. Periwound temperature was noted as No Abnormality. The periwound has tenderness on palpation. Assessment [name], Cynthia C. 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 Cleanse wound with mild soap and water Wound #2 Right,Distal,Posterior Lower Leg: Clean wound with Normal Saline. Cleanse wound with mild soap and water Anesthetic: Wound #1 Right,Posterior Lower Leg: Topical Lidocaine 4% cream applied to wound bed prior to debridement - for clinic use Wound #2 Right,Distal,Posterior Lower  Leg: Topical Lidocaine 4% cream applied to wound bed prior to debridement - for clinic use Skin Barriers/Peri-Wound Care: Wound #1 Right,Posterior Lower Leg: Skin Prep Wound #2 Right,Distal,Posterior Lower Leg: Skin Prep Primary Wound Dressing: Wound #1 Right,Posterior Lower Leg: Santyl Ointment Wound #2 Right,Distal,Posterior Lower Leg: Santyl Ointment Secondary Dressing: Wound #1 Right,Posterior Lower Leg: Dry Gauze Other - telfa island Wound #2 Right,Distal,Posterior Lower Leg: Dry Gauze Other - telfa island Dressing Change Frequency: Wound #1 Right,Posterior Lower Leg: Change dressing every day. Wound #2 Right,Distal,Posterior Lower Leg: Change dressing every day. [name], [name] ([id number]) Follow-up Appointments: Wound #1 Right,Posterior Lower Leg: Return Appointment in 1 week. Wound #2 Right,Distal,Posterior Lower Leg: Return Appointment in 1 week. Edema Control: Wound #1 Right,Posterior Lower Leg: Elevate legs to the level of the heart and pump ankles as often as possible Wound #2 Right,Distal,Posterior Lower Leg: Elevate legs to the level of the heart and pump ankles as often as possible Additional Orders /  Instructions: Wound #1 Right,Posterior Lower Leg: Stop Smoking Increase protein intake. Wound #2 Right,Distal,Posterior Lower Leg: Stop Smoking Increase protein intake. Medications-please add to medication list.: Wound #1 Right,Posterior Lower Leg: Santyl Enzymatic Ointment Other: - Vitamin C, Zinc, Multivitamin Wound #2 Right,Distal,Posterior Lower Leg: Santyl Enzymatic Ointment Other: - Vitamin C, Zinc, Multivitamin As before, the patient will not consent to a debridement and at this stage I have asked her to talk to her PCP regarding a possible surgical consultation for debridement under IV sedation. I have recommended: 1. Santyl ointment to be applied to the wound with dressing changes daily. 2. she may also see her PCP regarding the possibility of this being gout on her right big toe 3. Good control of her diabetes mellitus with a recent A1c to be done 4. I'm pleased to learn that she is still off smoking 5. regular visits the wound center -- I have recommended she takes an analgesic before she comes so that I can do a sharp debridement. Electronic Signature(s) Signed: 10/31/2016 1:30:10 PM By: Evlyn Kanner MD, FACS Entered By: Evlyn Kanner on 10/31/2016 13:30:10 Cynthia School ([id number]) -------------------------------------------------------------------------------- SuperBill Details Patient Name: Cynthia Hey C. Date of Service: 10/31/2016 Medical Record Number: [id number] Patient Account Number: [id number] Date of Birth/Sex: 25-Mar-1961 (56 y.o. Female) Treating RN: Ashok Cordia, Debi Primary Care Provider: Sandrea Hughs Other Clinician: Referring Provider: Sandrea Hughs Treating Provider/Extender: Evlyn Kanner Service Line: Outpatient Weeks in Treatment: 8 Diagnosis Coding ICD-10 Codes Code Description E11.622 Type 2 diabetes mellitus with other skin ulcer L97.212 Non-pressure chronic ulcer of right calf with fat layer exposed I70.232 Atherosclerosis of native  arteries of right leg with ulceration of calf F17.218 Nicotine dependence, cigarettes, with other nicotine-induced disorders E66.01 Morbid (severe) obesity due to excess calories Facility Procedures CPT4 Code: 08657846 Description: 99213 - WOUND CARE VISIT-LEV 3 EST PT Modifier: Quantity: 1 Physician Procedures CPT4 Code Description: [id number] 99213 - WC PHYS LEVEL 3 - EST PT ICD-10 Description Diagnosis E11.622 Type 2 diabetes mellitus with other skin ulcer L97.212 Non-pressure chronic ulcer of right calf with fat lay I70.232 Atherosclerosis of native arteries  of right leg with Modifier: er exposed ulceration of c Quantity: 1 alf Electronic Signature(s) Signed: 10/31/2016 4:07:26 PM By: Evlyn Kanner MD, FACS Signed: 11/04/2016 1:35:52 PM By: Alejandro Mulling Previous Signature: 10/31/2016 1:30:28 PM Version By: Evlyn Kanner MD, FACS Entered By: Alejandro Mulling on 10/31/2016 14:19:40

## 2016-11-05 NOTE — Progress Notes (Signed)
AMENAH, TUCCI ([id number]) Visit Report for 10/31/2016 Arrival Information Details Patient Name: [name], [name]. Date of Service: 10/31/2016 12:30 PM Medical Record Number: [id number] Patient Account Number: [id number] Date of Birth/Sex: [birth year] (56 y.o. Female) Treating RN: Carolyne Fiscal, Debi Primary Care Gotti Alwin: Freddy Finner Other Clinician: Referring Nasiya Pascual: Freddy Finner Treating Shyteria Lewis/Extender: Frann Rider in Treatment: 8 Visit Information History Since Last Visit All ordered tests and consults were completed: No Patient Arrived: Wheel Chair Added or deleted any medications: No Arrival Time: 12:36 Any new allergies or adverse reactions: No Accompanied By: daughter Had a fall or experienced change in No Transfer Assistance: None activities of daily living that may affect Patient Identification Verified: Yes risk of falls: Secondary Verification Process Yes Signs or symptoms of abuse/neglect since last No Completed: visito Patient Requires Transmission- No Hospitalized since last visit: No Based Precautions: Has Dressing in Place as Prescribed: Yes Patient Has Alerts: Yes Pain Present Now: Yes Patient Alerts: Patient on Blood Thinner DM II Plavix Electronic Signature(s) Signed: 11/04/2016 1:35:52 PM By: Alric Quan Entered By: Alric Quan on 10/31/2016 12:36:42 Dauphin, Damita Dunnings ([id number]) -------------------------------------------------------------------------------- Clinic Level of Care Assessment Details Patient Name: Cynthia Aquas C. Date of Service: 10/31/2016 12:30 PM Medical Record Number: [id number] Patient Account Number: [id number] Date of Birth/Sex: [birth year] (56 y.o. Female) Treating RN: Carolyne Fiscal, Debi Primary Care Maevyn Riordan: Freddy Finner Other Clinician: Referring Julias Mould: Freddy Finner Treating Keymani Glynn/Extender: Frann Rider in Treatment: 8 Clinic Level of Care Assessment Items TOOL 4 Quantity Score X - Use when only  an EandM is performed on FOLLOW-UP visit 1 0 ASSESSMENTS - Nursing Assessment / Reassessment X - Reassessment of Co-morbidities (includes updates in patient status) 1 10 X - Reassessment of Adherence to Treatment Plan 1 5 ASSESSMENTS - Wound and Skin Assessment / Reassessment []  - Simple Wound Assessment / Reassessment - one wound 0 X - Complex Wound Assessment / Reassessment - multiple wounds 2 5 []  - Dermatologic / Skin Assessment (not related to wound area) 0 ASSESSMENTS - Focused Assessment []  - Circumferential Edema Measurements - multi extremities 0 []  - Nutritional Assessment / Counseling / Intervention 0 []  - Lower Extremity Assessment (monofilament, tuning fork, pulses) 0 []  - Peripheral Arterial Disease Assessment (using hand held doppler) 0 ASSESSMENTS - Ostomy and/or Continence Assessment and Care []  - Incontinence Assessment and Management 0 []  - Ostomy Care Assessment and Management (repouching, etc.) 0 PROCESS - Coordination of Care []  - Simple Patient / Family Education for ongoing care 0 X - Complex (extensive) Patient / Family Education for ongoing care 1 20 X - Staff obtains Programmer, systems, Records, Test Results / Process Orders 1 10 []  - Staff telephones HHA, Nursing Homes / Clarify orders / etc 0 []  - Routine Transfer to another Facility (non-emergent condition) 0 Mizrahi, Roniyah C. ([id number]) []  - Routine Hospital Admission (non-emergent condition) 0 []  - New Admissions / Biomedical engineer / Ordering NPWT, Apligraf, etc. 0 []  - Emergency Hospital Admission (emergent condition) 0 X - Simple Discharge Coordination 1 10 []  - Complex (extensive) Discharge Coordination 0 PROCESS - Special Needs []  - Pediatric / Minor Patient Management 0 []  - Isolation Patient Management 0 []  - Hearing / Language / Visual special needs 0 []  - Assessment of Community assistance (transportation, D/C planning, etc.) 0 []  - Additional assistance / Altered mentation 0 []  - Support  Surface(s) Assessment (bed, cushion, seat, etc.) 0 INTERVENTIONS - Wound Cleansing / Measurement []  - Simple Wound Cleansing - one wound 0 X - Complex Wound  Cleansing - multiple wounds 2 5 X - Wound Imaging (photographs - any number of wounds) 1 5 []  - Wound Tracing (instead of photographs) 0 []  - Simple Wound Measurement - one wound 0 X - Complex Wound Measurement - multiple wounds 2 5 INTERVENTIONS - Wound Dressings []  - Small Wound Dressing one or multiple wounds 0 X - Medium Wound Dressing one or multiple wounds 1 15 []  - Large Wound Dressing one or multiple wounds 0 X - Application of Medications - topical 1 5 []  - Application of Medications - injection 0 INTERVENTIONS - Miscellaneous []  - External ear exam 0 Albano, Tyteanna C. ([id number]) []  - Specimen Collection (cultures, biopsies, blood, body fluids, etc.) 0 []  - Specimen(s) / Culture(s) sent or taken to Lab for analysis 0 []  - Patient Transfer (multiple staff / Harrel Lemon Lift / Similar devices) 0 []  - Simple Staple / Suture removal (25 or less) 0 []  - Complex Staple / Suture removal (26 or more) 0 []  - Hypo / Hyperglycemic Management (close monitor of Blood Glucose) 0 []  - Ankle / Brachial Index (ABI) - do not check if billed separately 0 X - Vital Signs 1 5 Has the patient been seen at the hospital within the last three years: Yes Total Score: 115 Level Of Care: New/Established - Level 3 Electronic Signature(s) Signed: 11/04/2016 1:35:52 PM By: Alric Quan Entered By: Alric Quan on 10/31/2016 14:19:26 Detty, Damita Dunnings ([id number]) -------------------------------------------------------------------------------- Encounter Discharge Information Details Patient Name: Cynthia Aquas C. Date of Service: 10/31/2016 12:30 PM Medical Record Number: [id number] Patient Account Number: [id number] Date of Birth/Sex: 1960-11-23 (56 y.o. Female) Treating RN: Carolyne Fiscal, Debi Primary Care Milany Geck: Freddy Finner Other  Clinician: Referring Kevionna Heffler: Freddy Finner Treating Khori Rosevear/Extender: Frann Rider in Treatment: 8 Encounter Discharge Information Items Discharge Pain Level: 0 Discharge Condition: Stable Ambulatory Status: Wheelchair Discharge Destination: Home Transportation: Private Auto Accompanied By: daughter Schedule Follow-up Appointment: Yes Medication Reconciliation completed No and provided to Patient/Care Lahela Woodin: Provided on Clinical Summary of Care: 10/31/2016 Form Type Recipient Paper Patient VS Electronic Signature(s) Signed: 10/31/2016 1:22:54 PM By: Ruthine Dose Entered By: Ruthine Dose on 10/31/2016 13:22:54 Robel, Osha Loletha Grayer ([id number]) -------------------------------------------------------------------------------- Lower Extremity Assessment Details Patient Name: [name], Maxene C. Date of Service: 10/31/2016 12:30 PM Medical Record Number: [id number] Patient Account Number: [id number] Date of Birth/Sex: 03-21-1961 (56 y.o. Female) Treating RN: Carolyne Fiscal, Debi Primary Care Paulyne Mooty: Freddy Finner Other Clinician: Referring Tonye Tancredi: Freddy Finner Treating Champ Keetch/Extender: Frann Rider in Treatment: 8 Vascular Assessment Pulses: Dorsalis Pedis Palpable: [Right:Yes] Posterior Tibial Extremity colors, hair growth, and conditions: Extremity Color: [Right:Normal] Temperature of Extremity: [Right:Warm] Capillary Refill: [Right:< 3 seconds] Electronic Signature(s) Signed: 11/04/2016 1:35:52 PM By: Alric Quan Entered By: Alric Quan on 10/31/2016 12:43:29 Glaze, Wm C. ([id number]) -------------------------------------------------------------------------------- Multi Wound Chart Details Patient Name: Cynthia Aquas C. Date of Service: 10/31/2016 12:30 PM Medical Record Number: [id number] Patient Account Number: [id number] Date of Birth/Sex: 03-06-1961 (56 y.o. 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] [N/A:N/A] Comorbid History: [1:Anemia, Chronic Obstructive Pulmonary Disease (COPD), Sleep Disease (COPD), Sleep Apnea, Congestive  Heart Apnea, Congestive Heart Failure, Coronary Artery Failure, Coronary Artery Disease, Hypertension, Peripheral Venous Disease,  Type II Diabetes, Disease, Type II Diabetes, Osteoarthritis, Neuropathy Osteoarthritis, Neuropathy] [2:Anemia, Chronic Obstructive Pulmonary Disease, Hypertension, Peripheral Venous] [N/A:N/A] Date Acquired: [1:01/05/2016] [2:10/05/2016] [N/A:N/A] Weeks of Treatment: [1:8] [2:3] [N/A:N/A] Wound Status: [1:Open] [2:Open] [N/A:N/A] Measurements L x W x D 1.3x3x0.1 [2:1x0.4x0.1] [N/A:N/A] (cm) Area (cm) : [1:3.063] [2:0.314] [N/A:N/A] Volume (cm) : [1:0.306] [2:0.031] [N/A:N/A] % Reduction in Area: [1:-189.00%] [2:-21.20%] [N/A:N/A] % Reduction in Volume: -188.70% [2:-19.20%] [N/A:N/A] Classification: [1:Grade 1] [2:Full Thickness Without Exposed Support Structures] [N/A:N/A] HBO Classification: [1:N/A] [2:Grade 1] [N/A:N/A] Exudate Amount: [1:Large] [2:Large] [N/A:N/A] Exudate Type: [1:Serous] [2:Serous] [N/A:N/A] Exudate Color: amber amber N/A Wound Margin: Distinct, outline attached Flat and Intact N/A Granulation Amount: None Present (0%) None Present (0%) N/A Necrotic Amount: Large (67-100%) Large (67-100%) N/A Necrotic Tissue: Adherent Slough Eschar, Adherent  Slough N/A Exposed Structures: Fascia: No Fascia: No N/A Fat Layer (Subcutaneous Fat Layer (Subcutaneous Tissue) Exposed: No Tissue) Exposed: No Tendon: No Tendon: No Muscle: No Muscle: No Joint: No Joint: No Bone: No Bone: No Limited to Skin Limited to Skin Breakdown Breakdown Epithelialization: None None N/A Periwound Skin Texture: Excoriation: No Excoriation: No N/A Induration: No Induration: No Callus: No Callus: No Crepitus: No Crepitus: No Rash: No Rash: No Scarring: No Scarring: No Periwound Skin Maceration: No Maceration: No N/A Moisture: Dry/Scaly: No Dry/Scaly: No Periwound Skin Color: Erythema: Yes Atrophie Blanche: No N/A Atrophie Blanche: No Cyanosis: No Cyanosis: No Ecchymosis: No Ecchymosis: No Erythema: No Hemosiderin Staining: No Hemosiderin Staining: No Mottled: No Mottled: No Pallor: No Pallor: No Rubor: No Rubor: No Erythema Location: Circumferential N/A N/A Temperature: No Abnormality No Abnormality N/A Tenderness on Yes Yes N/A Palpation: Wound Preparation: Ulcer Cleansing: Ulcer Cleansing: N/A Rinsed/Irrigated with Rinsed/Irrigated with Saline Saline Topical Anesthetic Topical Anesthetic Applied: Other: lidocaine Applied: Other: lidocaine 4% 4% Treatment Notes Wound #1 (Right, Posterior Lower Leg) 1. Cleansed with: Clean wound with Normal Saline 2. Anesthetic Topical Lidocaine 4% cream to wound bed prior to debridement Collier, Kawena C. ([id number]) 3. Peri-wound Care: Skin Prep 4. Dressing Applied: Santyl Ointment 5. Secondary Scissors Wound #2 (Right, Distal, Posterior Lower Leg) 1. Cleansed with: Clean wound with Normal Saline 2. Anesthetic Topical Lidocaine 4% cream to wound bed prior to debridement 3. Peri-wound Care: Skin Prep 4. Dressing Applied: Santyl Ointment 5. Secondary Dressing Applied Dry [place] Signature(s) Signed: 10/31/2016 1:25:49 PM By:  Christin Fudge MD, FACS Entered By: Christin Fudge on 10/31/2016 13:25:48 [name] ([id number]) -------------------------------------------------------------------------------- [place] Details Patient Name: Cynthia Aquas C. Date of Service: 10/31/2016 12:30 PM Medical Record Number: [id number] Patient Account Number: [id number] Date of Birth/Sex: 04-Mar-1961 (56 y.o. Female) Treating RN: Carolyne Fiscal, Debi Primary Care Kentavius Dettore: Freddy Finner Other Clinician: Referring Zahari Fazzino: Freddy Finner Treating Taleya Whitcher/Extender: Frann Rider in Treatment: 8 Active Inactive ` Abuse / Safety / Falls / Self Care Management Nursing Diagnoses: Potential for falls Goals: Patient will remain injury free Date Initiated: 09/05/2016 Target Resolution Date: 10/24/2016 Goal Status: Active Interventions: Assess fall risk on admission and as needed Assess impairment of mobility on admission and as needed per policy Notes: ` Nutrition Nursing Diagnoses: Imbalanced nutrition Impaired glucose control: actual or potential Goals: Patient/caregiver agrees to and verbalizes understanding of need to use nutritional supplements and/or vitamins as prescribed Date Initiated: 09/05/2016 Target Resolution Date: 10/24/2016 Goal Status: Active Patient/caregiver verbalizes understanding of need to maintain therapeutic glucose control per primary care  physician Date Initiated: 09/05/2016 Target Resolution Date: 10/24/2016 Goal Status: Active Patient/caregiver will maintain therapeutic glucose control Date Initiated: 09/05/2016 Target Resolution Date: 10/24/2016 Goal Status: Active DIDI, GANAWAY ([id number]) Interventions: Assess patient nutrition upon admission and as needed per policy Provide education on elevated blood sugars and impact on wound healing Provide education on nutrition Treatment Activities: Education provided on Nutrition : 09/05/2016 Notes: ` Orientation to  the Wound Care Program Nursing Diagnoses: Knowledge deficit related to the wound healing center program Goals: Patient/caregiver will verbalize understanding of the [place] Date Initiated: 09/05/2016 Target Resolution Date: 10/24/2016 Goal Status: Active Interventions: Provide education on orientation to the wound center Notes: ` Pain, Acute or Chronic Nursing Diagnoses: Pain, acute or chronic: actual or potential Potential alteration in comfort, pain Goals: Patient will verbalize adequate pain control and receive pain control interventions during procedures as needed Date Initiated: 09/05/2016 Target Resolution Date: 10/24/2016 Goal Status: Active Patient/caregiver will verbalize adequate pain control between visits Date Initiated: 09/05/2016 Target Resolution Date: 10/24/2016 Goal Status: Active Patient/caregiver will verbalize comfort level met Date Initiated: 09/05/2016 Target Resolution Date: 10/24/2016 Goal Status: Active Interventions: [place], Morissa C. ([id number]) Assess comfort goal upon admission Complete pain assessment as per visit requirements Notes: ` Wound/Skin Impairment Nursing Diagnoses: Impaired tissue integrity Knowledge deficit related to smoking impact on wound healing Knowledge deficit related to ulceration/compromised skin integrity Goals: Ulcer/skin breakdown will have a volume reduction of 30% by week 4 Date Initiated: 09/05/2016 Target Resolution Date: 10/24/2016 Goal Status: Active Ulcer/skin breakdown will have a volume reduction of 50% by week 8 Date Initiated: 09/05/2016 Target Resolution Date: 10/24/2016 Goal Status: Active Ulcer/skin breakdown will have a volume reduction of 80% by week 12 Date Initiated: 09/05/2016 Target Resolution Date: 10/24/2016 Goal Status: Active Interventions: Assess patient/caregiver ability to perform ulcer/skin care regimen upon admission and as needed Assess ulceration(s) every  visit Provide education on smoking Notes: Electronic Signature(s) Signed: 11/04/2016 1:35:52 PM By: Alric Quan Entered By: Alric Quan on 10/31/2016 12:47:53 Pellegrin, Fredrika Loletha Grayer ([id number]) -------------------------------------------------------------------------------- Pain Assessment Details Patient Name: Cynthia Aquas C. Date of Service: 10/31/2016 12:30 PM Medical Record Number: [id number] Patient Account Number: [id number] Date of Birth/Sex: [birth year] (56 y.o. Female) Treating RN: Carolyne Fiscal, Debi Primary Care Maylee Bare: Freddy Finner Other Clinician: Referring Shelbe Haglund: Freddy Finner Treating Gino Garrabrant/Extender: Frann Rider in Treatment: 8 Active Problems Location of Pain Severity and Description of Pain Patient Has Paino Yes Site Locations Pain Location: Pain in Ulcers With Dressing Change: Yes Rate the pain. Current Pain Level: 7 Character of Pain Describe the Pain: Burning, Tender, Throbbing Pain Management and Medication Current Pain Management: Electronic Signature(s) Signed: 11/04/2016 1:35:52 PM By: Alric Quan Entered By: Alric Quan on 10/31/2016 12:36:59 Trompeter, Damita Dunnings ([id number]) -------------------------------------------------------------------------------- Patient/Caregiver Education Details Patient Name: [name]. Date of Service: 10/31/2016 12:30 PM Medical Record Number: [id number] Patient Account Number: [id number] Date of Birth/Gender: [birth year] (56 y.o. Female) Treating RN: Carolyne Fiscal, Debi Primary Care Physician: Freddy Finner Other Clinician: Referring Physician: Freddy Finner Treating Physician/Extender: Frann Rider in Treatment: 8 Education Assessment Education Provided To: Patient Education Topics Provided Wound/Skin Impairment: Handouts: Other: change dressing as ordered Methods: Demonstration, Explain/Verbal Responses: State content correctly Electronic Signature(s) Signed: 11/04/2016 1:35:52 PM By:  Alric Quan Entered By: Alric Quan on 10/31/2016 12:47:44 Ades, Raphael C. ([id number]) -------------------------------------------------------------------------------- Wound Assessment Details Patient Name: [name], Tishina C. Date of Service: 10/31/2016 12:30 PM Medical Record Number: [id number] Patient Account Number: [id number] Date of Birth/Sex: 23-Mar-1961 (56 y.o. Female) Treating RN: Carolyne Fiscal,  Debi Primary Care Maize Brittingham: Freddy Finner Other Clinician: Referring Ashauna Bertholf: Freddy Finner Treating Hampton Wixom/Extender: Frann Rider in Treatment: 8 Wound Status Wound Number: 1 Primary Diabetic Wound/Ulcer of the Lower Etiology: Extremity Wound Location: Right Lower Leg - Posterior Wound Open Wounding Event: Gradually Appeared Status: Date Acquired: 01/05/2016 Comorbid Anemia, Chronic Obstructive Pulmonary Weeks Of Treatment: 8 History: Disease (COPD), Sleep Apnea, Clustered Wound: No Congestive Heart Failure, Coronary Artery Disease, Hypertension, Peripheral Venous Disease, Type II Diabetes, Osteoarthritis, Neuropathy Photos Photo Uploaded By: Alric Quan on 10/31/2016 13:26:04 Wound Measurements Length: (cm) 1.3 Width: (cm) 3 Depth: (cm) 0.1 Area: (cm) 3.063 Volume: (cm) 0.306 % Reduction in Area: -189% % Reduction in Volume: -188.7% Epithelialization: None Tunneling: No Undermining: No Wound Description Classification: Grade 1 Foul Odor Afte Wound Margin: Distinct, outline attached Exudate Amount: Large Exudate Type: Serous Exudate Color: amber r Cleansing: No Wound Bed Granulation Amount: None Present (0%) Exposed Structure Gelpi, Zoila C. ([id number]) Necrotic Amount: Large (67-100%) Fascia Exposed: No Necrotic Quality: Adherent Slough Fat Layer (Subcutaneous Tissue) Exposed: No Tendon Exposed: No Muscle Exposed: No Joint Exposed: No Bone Exposed: No Limited to Skin Breakdown Periwound Skin Texture Texture Color No Abnormalities  Noted: No No Abnormalities Noted: No Callus: No Atrophie Blanche: No Crepitus: No Cyanosis: No Excoriation: No Ecchymosis: No Induration: No Erythema: Yes Rash: No Erythema Location: Circumferential Scarring: No Hemosiderin Staining: No Mottled: No Moisture Pallor: No No Abnormalities Noted: No Rubor: No Dry / Scaly: No Maceration: No Temperature / Pain Temperature: No Abnormality Tenderness on Palpation: Yes Wound Preparation Ulcer Cleansing: Rinsed/Irrigated with Saline Topical Anesthetic Applied: Other: lidocaine 4%, Treatment Notes Wound #1 (Right, Posterior Lower Leg) 1. Cleansed with: Clean wound with Normal Saline 2. Anesthetic Topical Lidocaine 4% cream to wound bed prior to debridement 3. Peri-wound Care: Skin Prep 4. Dressing Applied: Santyl Ointment 5. Secondary Dressing Applied Dry [place] Signature(s) Signed: 11/04/2016 1:35:52 PM By: Alric Quan Entered By: Alric Quan on 10/31/2016 12:42:05 KYANNAH, CLIMER ([id number]) Brake, Damita Dunnings ([id number]) -------------------------------------------------------------------------------- Wound Assessment Details Patient Name: [name], Shravya C. Date of Service: 10/31/2016 12:30 PM Medical Record Number: [id number] Patient Account Number: [id number] Date of Birth/Sex: Jun 28, 1961 (56 y.o. Female) Treating RN: Carolyne Fiscal, Debi Primary Care [place] Lamons: Freddy Finner Other Clinician: Referring Kayona Foor: Freddy Finner Treating Ahlia Lemanski/Extender: Frann Rider in Treatment: 8 Wound Status Wound Number: 2 Primary Venous Leg Ulcer Etiology: Wound Location: Right Lower Leg - Posterior, Distal Wound Open Status: Wounding Event: Trauma Comorbid Anemia, Chronic Obstructive Pulmonary Date Acquired: 10/05/2016 History: Disease (COPD), Sleep Apnea, Weeks Of Treatment: 3 Congestive Heart Failure, Coronary Clustered Wound: No Artery Disease, Hypertension, Peripheral Venous Disease, Type  II Diabetes, Osteoarthritis, Neuropathy Photos Photo Uploaded By: Alric Quan on 10/31/2016 13:26:04 Wound Measurements Length: (cm) 1 Width: (cm) 0.4 Depth: (cm) 0.1 Area: (cm) 0.314 Volume: (cm) 0.031 % Reduction in Area: -21.2% % Reduction in Volume: -19.2% Epithelialization: None Tunneling: No Undermining: No Wound Description Full Thickness Without Foul Odor Afte Classification: Exposed Support Structures Diabetic Severity Grade 1 (Wagner): Wound Margin: Flat and Intact Exudate Amount: Large Exudate Type: Serous Exudate Color: amber Lafave, Karmin C. ([id number]) r Cleansing: No Wound Bed Granulation Amount: None Present (0%) Exposed Structure Necrotic Amount: Large (67-100%) Fascia Exposed: No Necrotic Quality: Eschar, Adherent Slough Fat Layer (Subcutaneous Tissue) Exposed: No Tendon Exposed: No Muscle Exposed: No Joint Exposed: No Bone Exposed: No Limited to Skin Breakdown Periwound Skin Texture Texture Color No Abnormalities Noted: No No Abnormalities Noted: No Callus: No Atrophie Blanche: No  Crepitus: No Cyanosis: No Excoriation: No Ecchymosis: No Induration: No Erythema: No Rash: No Hemosiderin Staining: No Scarring: No Mottled: No Pallor: No Moisture Rubor: No No Abnormalities Noted: No Dry / Scaly: No Temperature / Pain Maceration: No Temperature: No Abnormality Tenderness on Palpation: Yes Wound Preparation Ulcer Cleansing: Rinsed/Irrigated with Saline Topical Anesthetic Applied: Other: lidocaine 4%, Treatment Notes Wound #2 (Right, Distal, Posterior Lower Leg) 1. Cleansed with: Clean wound with Normal Saline 2. Anesthetic Topical Lidocaine 4% cream to wound bed prior to debridement 3. Peri-wound Care: Skin Prep 4. Dressing Applied: Santyl Ointment 5. Secondary Dressing Applied Dry [place] Signature(s) Signed: 11/04/2016 1:35:52 PM By: Daleen Squibb, Damita Dunnings ([id number]) Entered By:  Alric Quan on 10/31/2016 12:42:14 [name] ([id number]) -------------------------------------------------------------------------------- [place] Details Patient Name: Cynthia Aquas C. Date of Service: 10/31/2016 12:30 PM Medical Record Number: [id number] Patient Account Number: [id number] Date of Birth/Sex: [birth year] (55 y.o. Female) Treating RN: Carolyne Fiscal, Debi Primary Care Kathyrn Warmuth: Freddy Finner Other Clinician: Referring Zoelle Markus: Freddy Finner Treating Ruthanne Mcneish/Extender: Frann Rider in Treatment: 8 Vital Signs Time Taken: 12:37 Temperature (F): 98.4 Height (in): 62 Pulse (bpm): 89 Weight (lbs): 276 Respiratory Rate (breaths/min): 20 Body Mass Index (BMI): 50.5 Blood Pressure (mmHg): 170/78 Reference Range: 80 - 120 mg / dl Electronic Signature(s) Signed: 11/04/2016 1:35:52 PM By: Alric Quan Entered By: Alric Quan on 10/31/2016 12:37:46

## 2016-11-07 ENCOUNTER — Ambulatory Visit: Payer: Medicaid Other | Admitting: Surgery

## 2016-11-08 ENCOUNTER — Encounter: Payer: Self-pay | Admitting: General Surgery

## 2016-11-08 ENCOUNTER — Ambulatory Visit (INDEPENDENT_AMBULATORY_CARE_PROVIDER_SITE_OTHER): Payer: Medicaid Other | Admitting: General Surgery

## 2016-11-08 VITALS — BP 124/72 | HR 88 | Resp 16 | Ht 62.0 in | Wt 274.0 lb

## 2016-11-08 DIAGNOSIS — L97919 Non-pressure chronic ulcer of unspecified part of right lower leg with unspecified severity: Secondary | ICD-10-CM | POA: Diagnosis not present

## 2016-11-08 NOTE — Progress Notes (Signed)
Patient ID: [name], female   DOB: [birth year], 56 y.o.   MRN: [id number]  Chief Complaint  Patient presents with  . Other    cellulitis    HPI [name] is a 56 y.o. female. Here today for evaluation of cellulitis right lower posterior leg. She states she has had trouble for over a year. She thinks it started out as a scab. She states she has been followed by the wound center. She has been using Sandostatin. She is using ibuprofen and tramadol for pain. She denies any drainage. She also has had vascular intervention with stents on right leg and a venous cause has been ruled out I have reviewed the history of present illness with the patient.    HPI  Past Medical History:  Diagnosis Date  . Anemia   . Anxiety   . Arthritis   . CHF (congestive heart failure) (HCC)   . COPD (chronic obstructive pulmonary disease) (HCC)   . Coronary artery disease   . Diabetes mellitus without complication (HCC)   . Fibromyalgia   . GERD (gastroesophageal reflux disease)   . Hypertension   . Peripheral vascular disease (HCC)   . Sleep apnea   . Stroke [facility])     Past Surgical History:  Procedure Laterality Date  . CESAREAN SECTION    . CORONARY ANGIOPLASTY WITH STENT PLACEMENT Left   . DILATION AND CURETTAGE OF UTERUS    . ENDARTERECTOMY Left 01/29/2015   Procedure: ENDARTERECTOMY CAROTID;  Surgeon: Annice Needy, MD;  Location: [facility] ORS;  Service: Vascular;  Laterality: Left;  . PERIPHERAL VASCULAR CATHETERIZATION Right 05/27/2015   Procedure: Lower Extremity Angiography;  Surgeon: Annice Needy, MD;  Location: [facility] INVASIVE CV LAB;  Service: Cardiovascular;  Laterality: Right;  . PERIPHERAL VASCULAR CATHETERIZATION  05/27/2015   Procedure: Lower Extremity Intervention;  Surgeon: Annice Needy, MD;  Location: [facility] INV[facility];  Service: Cardiovascular;;  . PERIPHERAL VASCULAR CATHETERIZATION Right 09/22/2016   Procedure: Lower Extremity Angiography;  Surgeon: Annice Needy, MD;  Location:  [facility] INV[facility] CV [facility];  Service: Cardiovascular;  Laterality: Right;  . TUBAL LIGATION      No family history on file.  Social History Social History  Substance Use Topics  . Smoking status: Former Smoker    Packs/day: 0.25    Years: 38.00    Types: Cigarettes    Quit date: 09/12/2016  . Smokeless tobacco: Current User  . Alcohol use No    Allergies  Allergen Reactions  . Niacin And Related Nausea And Vomiting    Current Outpatient Prescriptions  Medication Sig Dispense Refill  . aspirin EC 81 MG tablet Take 81 mg by mouth daily.    Marland Kitchen buPROPion (WELLBUTRIN SR) 150 MG 12 hr tablet TAKE 1 TABLET BY MOUTH 3 TIMES A DAY FOR 3 DAYS THEN 1 TABLET TWICE A DAY AFTER  6  . BYDUREON 2 MG PEN INJECT SUBCUTANEOUSLY ONCE WEEKLY FOR HIGH BLOOD SUGAR  6  . carvedilol (COREG) 3.125 MG tablet Take 3.125 mg by mouth 2 (two) times daily with a meal.     . clopidogrel (PLAVIX) 75 MG tablet Take 75 mg by mouth daily.    . enalapril (VASOTEC) 10 MG tablet 1 BY MOUTH DAILY FOR HIGH BLOOD PRESSURE    . fexofenadine (ALLEGRA) 180 MG tablet Take by mouth.    . furosemide (LASIX) 40 MG tablet Take 40 mg by mouth daily.    Marland Kitchen gabapentin (NEURONTIN) 300 MG  capsule Take 300 mg by mouth daily as needed (for nerve pain).     Marland Kitchen. glipiZIDE (GLUCOTROL) 10 MG tablet 1 BY MOUTH TWICE A DAY FOR DIABETES  6  . ibuprofen (ADVIL,MOTRIN) 200 MG tablet Take 200 mg by mouth every 6 (six) hours as needed.    . insulin regular (NOVOLIN R,HUMULIN R) 250 units/2.525mL (100 units/mL) injection Inject 50-100 Units into the skin 2 (two) times daily before a meal. Inject 100 units before breakfast and 50 units before supper.    Marland Kitchen. JANUVIA 100 MG tablet 1 BY MOUTH DAILY FOR DIABETES  6  . metFORMIN (GLUCOPHAGE) 1000 MG tablet Take 1,000 mg by mouth 2 (two) times daily with a meal.    . pantoprazole (PROTONIX) 40 MG tablet Take 40 mg by mouth daily.    . sertraline (ZOLOFT) 100 MG tablet Take 150 mg by mouth daily.     . simvastatin  (ZOCOR) 40 MG tablet Take 40 mg by mouth daily.     . traMADol (ULTRAM) 50 MG tablet Take 1 tablet (50 mg total) by mouth every 6 (six) hours as needed. 10 tablet 0  . traZODone (DESYREL) 50 MG tablet Take 50 mg by mouth at bedtime.     No current facility-administered medications for this visit.     Review of Systems Review of Systems  Constitutional: Negative.   Respiratory: Negative.   Cardiovascular: Negative.     Blood pressure 124/72, pulse 88, resp. rate 16, height 5\' 2"  (1.575 m), weight 274 lb (124.3 kg).  Physical Exam Physical Exam  Constitutional: She is oriented to person, place, and time. She appears well-developed and well-nourished.  Cardiovascular:  Pulses:      Dorsalis pedis pulses are 0 on the right side, and 2+ on the left side.       Posterior tibial pulses are 0 on the right side, and 0 on the left side.  Left foot is warm and pink. Right foot with mild dusky discoloration in toes, mild dependant rubor.  Neurological: She is alert and oriented to person, place, and time.  Skin: Skin is warm and dry.  Mild erythremia with 2 superficial ulcers posterior right lower leg with a fibrinous material covering. No drainage noted.  Psychiatric: Her behavior is normal.    Data Reviewed  Progress notes  Assessment    Location of ulcers is atypical for primary arterial cause. However she does have some edema in legs, is diabetic and obese. She is also currently using smokeless tobacco. Multiple factors which affect healing. Pt advised that current treatment is adequate. Dr. Wyn Quakerew has been following her PAD.     Plan   No new treatment recommended. Continue Wound Clinic follow up Continue follow up with  Dr Wyn Quakerew. The patient is aware to call back for any questions or concerns.     This information has been scribed by Dorathy DaftMarsha Hatch RN, BSN,BC.    Jeremie Giangrande G 11/10/2016, 8:34 AM

## 2016-11-08 NOTE — Patient Instructions (Addendum)
The patient is aware to call back for any questions or concerns. Continue Wound Clinic

## 2016-11-10 ENCOUNTER — Telehealth (INDEPENDENT_AMBULATORY_CARE_PROVIDER_SITE_OTHER): Payer: Self-pay | Admitting: Vascular Surgery

## 2016-11-10 NOTE — Telephone Encounter (Signed)
Wants to know if Dr. Wyn Quakerew spoke with Dr. Clydell HakimSanikar about her sgy. She states it is suppose to be about moving forward pretty quickly but didn't have many details. She states that Dr. Catalina Pizzaold her she doesn't have very much circulation in her legs. She states it is ok to leave any info or her VM.

## 2016-11-14 ENCOUNTER — Encounter: Payer: Medicaid Other | Admitting: Surgery

## 2016-11-14 DIAGNOSIS — E11622 Type 2 diabetes mellitus with other skin ulcer: Secondary | ICD-10-CM | POA: Diagnosis not present

## 2016-11-14 NOTE — Progress Notes (Signed)
[name], [name] ([id number]) Visit Report for 11/14/2016 Chief Complaint Document Details Patient Name: [name], [name]. Date of Service: 11/14/2016 2:30 PM Medical Record Number: [id number] Patient Account Number: [id number] Date of Birth/Sex: 20-Dec-1960 (56 y.o. 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 Date of Service: 11/14/2016 2:30 PM Medical Record Number: [id number] Patient Account Number: [id number] Date of Birth/Sex: [birth year] (56 y.o. Female) Treating RN: Ashok Cordia, Debi Primary Care Provider: Sandrea Hughs Other Clinician: Referring Provider: Sandrea Hughs Treating Provider/Extender: Rudene Re in Treatment: 10 History of Present Illness Location: right posterior calf Quality: Patient reports experiencing a sharp pain to affected area(s). Severity: Patient states wound are getting worse. Duration: Patient has had the wound for > 8 months prior to seeking treatment at the wound center Timing: Pain in wound is constant (hurts all the time) Context: The wound appeared gradually over time Modifying Factors: Other treatment(s) tried include:several courses of antibiotics have been tried including at the ER recently Associated Signs and Symptoms: Patient reports having increase swelling. HPI Description: 56 year old patient  was seen about a month ago in the ER with a chronic wound to her right posterior calf which she had for 3 months. after workup a CT was also done and this showed cellulitis without a discrete drainable abscess and no evidence of mild fasciitis or pyomyositis. There Was also no septic arthritis or osteomyelitis. She was given injectable clindamycin and discharged home on oral clindamycin at that stage. Past medical history of diabetes mellitus, anemia, CHF, COPD, peripheral vascular disease and sleep apnea. She is known to have a carotid artery obstruction. she is status post coronary angioplasty with stents, carotid endarterectomy in 2016 on the left side by Dr. dew, right lower extremity angiography by Dr. dew in August 2016 with intervention. On review of the notes from her 05/27/2015 procedure by Dr. Wyn Quaker showed that she had peripheral artery disease with claudication bilateral lower extremity right greater than left. At that stage he did a right lower extremity angiogram with percutaneous angioplasty of the proximal and mid SFA, distal SFA and above- knee popliteal artery and placement of a stent in the mid to distal SFA and proximal popliteal artery. She smokes and has been doing this for several years. 10/03/2016 -- on 09/22/2016 and she had a procedure done by Dr. Wyn Quaker with aortogram and selective right lower extremity angiogram and placement of a stent after an angioplasty of the superficial femoral artery. There was near occlusion of the stenosis in the previously placed SFA stent. The popliteal artery is less than 30% stenosis. There was a two-vessel runoff distally with an anterior tibial artery and peroneal artery without stenosis. 10/10/2016 -- the patient was seen in the emergency department at [facility], on 10/04/2016, for a right toe pain and was treated for cellulitis of the right big toe. X-ray done was normal and was asked to see the podiatrist and the patient was  discharged home on Augmentin. x-ray of the right toes -- FINDINGS: Marginal osteophytosis noted at the IP and  DIP joints. Joint spaces are preserved. No acute fracture or dislocation. No radiopaque foreign body. No erosive change, evidence of soft tissue defect, or dissecting subcutaneous gas. The patient was also seen by Dr. Gwyneth Revels on 10/06/2016 [name] ([id number]) 10/24/2016 -- the patient was seen by her PCP and workup has been done for possible gout of her right big toe. She was also seen and Dr. Driscilla Grammes office by Ms. Stegmayer -- bilateral ABI was done which showed no significant lower extremity arterial disease. there was also improvement in her ABI and symptoms. her right ABI was 1.03 on the left was 1.00. The toe brachial indices on the right was 0.38 and on the left was 0.54. She was to be followed up in 3 months. 10/31/2016 -- for several weeks now the patient is not being compliant and will not allow debridement. the threshold of pain is very low and she has anxiety issues and in view of this and asked her to talk to her PCP about referring her to a surgeon possibly for a procedure under IV sedation. 11/14/2016 -- patient continues to complain of pain in her lower extremity and has a new wound on the medial part of her right toe. She was seen by Dr. Evette Cristal on 11/08/2016 -- after review he recommended to continue with the present care at the wound clinic and follow-up with Dr. Wyn Quaker. Electronic Signature(s) Signed: 11/14/2016 3:19:26 PM By: Evlyn Kanner MD, FACS Entered By: Evlyn Kanner on 11/14/2016 15:19:25 [name] ([id number]) -------------------------------------------------------------------------------- Physical Exam Details Patient Name: Cynthia Hey C. Date of Service: 11/14/2016 2:30 PM Medical Record Number: [id number] Patient Account Number: [id number] Date of Birth/Sex: August 29, 1961 (56 y.o. Female) Treating RN: Ashok Cordia, Debi Primary Care Provider:  Sandrea Hughs Other Clinician: Referring Provider: Sandrea Hughs Treating Provider/Extender: Rudene Re in Treatment: 10 Constitutional . Pulse regular. Respirations normal and unlabored. Afebrile. . Eyes Nonicteric. Reactive to light. Ears, Nose, Mouth, and Throat Lips, teeth, and gums WNL.Marland Kitchen Moist mucosa without lesions. Neck supple and nontender. No palpable supraclavicular or cervical adenopathy. Normal sized without goiter. Respiratory WNL. No retractions.. Cardiovascular Pedal Pulses WNL. No clubbing, cyanosis or edema. Chest Breasts symmetical and no nipple discharge.. Breast tissue WNL, no masses, lumps, or tenderness.. Lymphatic No adneopathy. No adenopathy. No adenopathy. Musculoskeletal Adexa without tenderness or enlargement.. Digits and nails w/o clubbing, cyanosis, infection, petechiae, ischemia, or inflammatory conditions.. Integumentary (Hair, Skin) No suspicious lesions. No crepitus or fluctuance. No peri-wound warmth or erythema. No masses.Marland Kitchen Psychiatric Judgement and insight Intact.. No evidence of depression, anxiety, or agitation.. Notes the patient has a new wound on the medial part of her right toe and this again is very tender and has minimal slough. Debridement was not possible today. The wound on her right posterior calf continues to have significant tenderness and sharp debridement is not possible. Electronic Signature(s) Signed: 11/14/2016 3:20:08 PM By: Evlyn Kanner MD, FACS Entered By: Evlyn Kanner on 11/14/2016 15:20:08 [name] ([id number]) -------------------------------------------------------------------------------- Physician Orders Details Patient Name: Cynthia Hey C. Date of Service: 11/14/2016 2:30 PM Medical Record Number: [id number] Patient Account Number: [id number] Date of Birth/Sex: [birth year] (56 y.o. Female) Treating RN: Ashok Cordia, Debi Primary Care Provider: Sandrea Hughs Other Clinician: Referring Provider:  Sandrea Hughs Treating Provider/Extender: Rudene Re in Treatment: 10 Verbal / Phone Orders: Yes Clinician: Ashok Cordia, Debi Read Back and Verified: Yes Diagnosis Coding Wound Cleansing Wound #1 Right,Posterior Lower Leg o Clean wound with Normal Saline. o Cleanse wound with mild soap and water  Wound #3 Right,Lateral Metatarsal head first o Clean wound with Normal Saline. o Cleanse wound with mild soap and water Wound #2 Right,Distal,Posterior Lower Leg o Clean wound with Normal Saline. o Cleanse wound with mild soap and water Anesthetic Wound #1 Right,Posterior Lower Leg o Topical Lidocaine 4% cream applied to wound bed prior to debridement - for clinic use Wound #3 Right,Lateral Metatarsal head first o Topical Lidocaine 4% cream applied to wound bed prior to debridement - for clinic use Wound #2 Right,Distal,Posterior Lower Leg o Topical Lidocaine 4% cream applied to wound bed prior to debridement - for clinic use Skin Barriers/Peri-Wound Care Wound #1 Right,Posterior Lower Leg o Skin Prep Wound #3 Right,Lateral Metatarsal head first o Skin Prep Wound #2 Right,Distal,Posterior Lower Leg o Skin Prep Primary Wound Dressing Wound #1 Right,Posterior Lower Leg o Santyl Ointment Scow, Marijane C. ([id number]) Wound #3 Right,Lateral Metatarsal head first o Santyl Ointment Wound #2 Right,Distal,Posterior Lower Leg o Santyl Ointment Secondary Dressing Wound #1 Right,Posterior Lower Leg o Dry Gauze o Other - telfa island Wound #2 Right,Distal,Posterior Lower Leg o Dry Gauze o Other - telfa island Wound #3 Right,Lateral Metatarsal head first o Dry Gauze o Other - band-aide Dressing Change Frequency Wound #1 Right,Posterior Lower Leg o Change dressing every day. Wound #3 Right,Lateral Metatarsal head first o Change dressing every day. Wound #2 Right,Distal,Posterior Lower Leg o Change dressing every day. Follow-up  Appointments Wound #1 Right,Posterior Lower Leg o Return Appointment in 1 week. Wound #3 Right,Lateral Metatarsal head first o Return Appointment in 1 week. Wound #2 Right,Distal,Posterior Lower Leg o Return Appointment in 1 week. Edema Control Wound #1 Right,Posterior Lower Leg o Elevate legs to the level of the heart and pump ankles as often as possible Wound #3 Right,Lateral Metatarsal head first o Elevate legs to the level of the heart and pump ankles as often as possible Wound #2 Right,Distal,Posterior Lower Leg Counterman, [place] C. ([id number]) o Elevate legs to the level of the heart and pump ankles as often as possible Additional Orders / Instructions Wound #1 Right,Posterior Lower Leg o Stop Smoking o Increase protein intake. Wound #2 Right,Distal,Posterior Lower Leg o Stop Smoking o Increase protein intake. Wound #3 Right,Lateral Metatarsal head first o Stop Smoking o Increase protein intake. Medications-please add to medication list. Wound #1 Right,Posterior Lower Leg o Santyl Enzymatic Ointment o Other: - Vitamin C, Zinc, Multivitamin Wound #3 Right,Lateral Metatarsal head first o Santyl Enzymatic Ointment o Other: - Vitamin C, Zinc, Multivitamin Wound #2 Right,Distal,Posterior Lower Leg o Santyl Enzymatic Ointment o Other: - Vitamin C, Zinc, Multivitamin Electronic Signature(s) Signed: 11/14/2016 3:34:28 PM By: Evlyn Kanner MD, FACS Signed: 11/14/2016 4:38:07 PM By: Alejandro Mulling Entered By: Alejandro Mulling on 11/14/2016 15:09:59 [place], Hazelyn Salena Saner ([id number]) -------------------------------------------------------------------------------- Problem List Details Patient Name: [name], Cynthia C. Date of Service: 11/14/2016 2:30 PM Medical Record Number: [id number] Patient Account Number: [id number] Date of Birth/Sex: 1961-04-12 (56 y.o. 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 F17.218 Nicotine dependence, cigarettes, with other nicotine- 09/05/2016 Yes induced disorders E66.01 Morbid (severe) obesity due to excess calories 09/05/2016 Yes L97.512 Non-pressure chronic ulcer of other part of  right foot with 11/14/2016 Yes fat layer exposed Inactive Problems Resolved Problems Electronic Signature(s) Signed: 11/14/2016 3:17:54 PM By: Evlyn Kanner MD, FACS Entered By: Evlyn Kanner on 11/14/2016 15:17:54 [place], Barrie Folk ([id number]) -------------------------------------------------------------------------------- Progress Note Details Patient Name: [name], Cynthia C. Date of Service: 11/14/2016 2:30 PM Medical Record Number: [id number] Patient Account Number: [id number] Date of Birth/Sex: 1960-12-07 (56 y.o. 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 Severity: Patient states wound are getting worse. Duration: Patient has had the wound for > 8 months prior  to seeking treatment at the wound center Timing: Pain in wound is constant (hurts all the time) Context: The wound appeared gradually over time Modifying Factors: Other treatment(s) tried include:several courses of antibiotics have been tried including at the ER recently Associated Signs and Symptoms: Patient reports having increase swelling. 56 year old patient was seen about a month ago in the ER with a chronic wound to her right posterior calf which she had for 3 months. after workup a CT was also done and this showed cellulitis without a discrete drainable abscess and no evidence of mild fasciitis or pyomyositis. There Was also no septic arthritis or osteomyelitis. She was given injectable clindamycin and discharged home on oral clindamycin at that stage. Past medical history of diabetes mellitus, anemia, CHF, COPD, peripheral vascular disease and sleep apnea. She is known to have a carotid artery obstruction. she is status post coronary angioplasty with stents, carotid endarterectomy in 2016 on the left side by Dr. dew, right lower extremity angiography by Dr. dew in August 2016 with intervention. On review of the notes from her 05/27/2015 procedure by Dr. Wyn Quaker showed that she had peripheral artery disease with claudication bilateral lower extremity right greater than left. At that stage he did a right lower extremity angiogram with percutaneous angioplasty of the proximal and mid SFA, distal SFA and above- knee popliteal artery and placement of a stent in the mid to distal SFA and proximal popliteal artery. She smokes and has been doing this for several years. 10/03/2016 -- on 09/22/2016 and she had a procedure done by Dr. Wyn Quaker with aortogram and selective right lower extremity angiogram and placement of a stent after an angioplasty of the superficial femoral artery. There was near occlusion of the stenosis in the previously placed SFA stent. The popliteal artery is less than 30% stenosis.  There was a two-vessel runoff distally with an anterior tibial artery and peroneal artery Hoey, Pietrina C. ([id number]) without stenosis. 10/10/2016 -- the patient was seen in the emergency department at [facility], on 10/04/2016, for a right toe pain and was treated for cellulitis of the right big toe. X-ray done was normal and was asked to see the podiatrist and the patient was discharged home on Augmentin. x-ray of the right toes -- FINDINGS: Marginal osteophytosis noted at the IP and DIP joints. Joint spaces are preserved. No acute fracture or dislocation. No radiopaque foreign body. No erosive change, evidence of soft tissue defect, or dissecting subcutaneous gas. The patient was also seen by Dr.  Gwyneth Revels on 10/06/2016 10/24/2016 -- the patient was seen by her PCP and workup has been done for possible gout of her right big toe. She was also seen and Dr. Driscilla Grammes office by Ms. Stegmayer -- bilateral ABI was done which showed no significant lower extremity arterial disease. there was also improvement in her ABI and symptoms. her right ABI was 1.03 on the left was 1.00. The toe brachial indices on the right was 0.38 and on the left was 0.54. She was to be followed up in 3 months. 10/31/2016 -- for several weeks now the patient is not being compliant and will not allow debridement. the threshold of pain is very low and she has anxiety issues and in view of this and asked her to talk to her PCP about referring her to a surgeon possibly for a procedure under IV sedation. 11/14/2016 -- patient continues to complain of pain in her lower extremity and has a new wound on the medial part of her right toe. She was seen by Dr. Evette Cristal on 11/08/2016 -- after review he recommended to continue with the present care at the wound clinic and follow-up with Dr. Wyn Quaker. Objective Constitutional Pulse regular. Respirations normal and unlabored. Afebrile. Vitals Time Taken: 2:48 PM, Height: 62 in, Weight: 276  lbs, BMI: 50.5, Temperature: 98.4 F, Pulse: 84 bpm, Respiratory Rate: 20 breaths/min, Blood Pressure: 110/68 mmHg. Eyes Nonicteric. Reactive to light. Ears, Nose, Mouth, and Throat Lips, teeth, and gums WNL.Marland Kitchen Moist mucosa without lesions. Neck supple and nontender. No palpable supraclavicular or cervical adenopathy. Normal sized without goiter. Respiratory WNL. No retractions.[name], Barrie Folk ([id number]) Cardiovascular Pedal Pulses WNL. No clubbing, cyanosis or edema. Chest Breasts symmetical and no nipple discharge.. Breast tissue WNL, no masses, lumps, or tenderness.. Lymphatic No adneopathy. No adenopathy. No adenopathy. Musculoskeletal Adexa without tenderness or enlargement.. Digits and nails w/o clubbing, cyanosis, infection, petechiae, ischemia, or inflammatory conditions.Marland Kitchen Psychiatric Judgement and insight Intact.. No evidence of depression, anxiety, or agitation.. General Notes: the patient has a new wound on the medial part of her right toe and this again is very tender and has minimal slough. Debridement was not possible today. The wound on her right posterior calf continues to have significant tenderness and sharp debridement is not possible. Integumentary (Hair, Skin) No suspicious lesions. No crepitus or fluctuance. No peri-wound warmth or erythema. No masses.. Wound #1 status is Open. Original cause of wound was Gradually Appeared. The wound is located on the Right,Posterior Lower Leg. The wound measures 0.7cm length x 3.2cm width x 0.1cm depth; 1.759cm^2 area and 0.176cm^3 volume. The wound is limited to skin breakdown. There is no tunneling or undermining noted. There is a large amount of serous drainage noted. The wound margin is distinct with the outline attached to the wound base. There is no granulation within the wound bed. There is a large (67-100%) amount of necrotic tissue within the wound bed including Adherent Slough. The periwound skin  appearance exhibited: Erythema. The periwound skin appearance did not exhibit: Callus, Crepitus, Excoriation, Induration, Rash, Scarring, Dry/Scaly, Maceration, Atrophie Blanche, Cyanosis, Ecchymosis, Hemosiderin Staining, Mottled, Pallor, Rubor. The surrounding wound skin color is noted with erythema which is circumferential. Periwound temperature was noted as No Abnormality. The periwound has tenderness on palpation. Wound #2 status is Open. Original cause of wound was Trauma. The wound is located on the Right,Distal,Posterior Lower Leg. The wound measures 1.3cm length x 0.7cm width x 0.1cm depth; 0.715cm^2 area and 0.071cm^3 volume. The wound is limited to  skin breakdown. There is no tunneling or undermining noted. There is a large amount of serous drainage noted. The wound margin is flat and intact. There is no granulation within the wound bed. There is a large (67-100%) amount of necrotic tissue within the wound bed including Eschar and Adherent Slough. The periwound skin appearance did not exhibit: Callus, Crepitus, Excoriation, Induration, Rash, Scarring, Dry/Scaly, Maceration, Atrophie Blanche, Cyanosis, Ecchymosis, Hemosiderin Staining, Mottled, Pallor, Rubor, Erythema. Periwound temperature was noted as No Abnormality. The periwound has tenderness on palpation. Wound #3 status is Open. Original cause of wound was Gradually Appeared. The wound is located on the Right,Lateral Metatarsal head first. The wound measures 0.6cm length x 0.8cm width x 0.1cm depth; 0.377cm^2 area and 0.038cm^3 volume. The wound is limited to skin breakdown. There is no tunneling or undermining noted. There is a large amount of serous drainage noted. The wound margin is distinct with the Belko, Tanette C. ([id number]) outline attached to the wound base. There is no granulation within the wound bed. There is a large (67- 100%) amount of necrotic tissue within the wound bed including Eschar and Adherent Slough.  Periwound temperature was noted as No Abnormality. The periwound has tenderness on palpation. 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 Cleanse wound with mild soap and water Wound #3 Right,Lateral Metatarsal head first: Clean wound with Normal Saline. Cleanse wound with mild soap and water Wound #2 Right,Distal,Posterior Lower Leg: Clean wound with Normal Saline. Cleanse wound with mild soap and water Anesthetic: Wound #1 Right,Posterior Lower Leg: Topical Lidocaine 4% cream applied to wound bed prior to debridement - for clinic use Wound #3 Right,Lateral Metatarsal head first: Topical Lidocaine 4% cream applied to wound bed prior to debridement - for clinic use Wound #2 Right,Distal,Posterior Lower Leg: Topical Lidocaine 4% cream applied to wound bed prior to debridement - for clinic use Skin Barriers/Peri-Wound Care: Wound #1 Right,Posterior Lower Leg: Skin Prep Wound #3 Right,Lateral Metatarsal head first: Skin Prep Wound #2 Right,Distal,Posterior Lower Leg: Skin Prep Cornforth, Rickia C. ([id number]) Primary Wound Dressing: Wound #1 Right,Posterior Lower Leg: Santyl Ointment Wound #3 Right,Lateral Metatarsal head first: Santyl Ointment Wound #2 Right,Distal,Posterior Lower Leg: Santyl Ointment Secondary Dressing: Wound #1 Right,Posterior Lower Leg: Dry Gauze Other - telfa island Wound #2 Right,Distal,Posterior Lower Leg: Dry Gauze Other - telfa island Wound  #3 Right,Lateral Metatarsal head first: Dry Gauze Other - band-aide Dressing Change Frequency: Wound #1 Right,Posterior Lower Leg: Change dressing every day. Wound #3 Right,Lateral Metatarsal head first: Change dressing every day. Wound #2 Right,Distal,Posterior Lower Leg: Change dressing every day. Follow-up Appointments: Wound #1 Right,Posterior Lower Leg: Return Appointment in 1 week. Wound #3 Right,Lateral Metatarsal head first: Return Appointment in 1 week. Wound #2 Right,Distal,Posterior Lower Leg: Return Appointment in 1 week. Edema Control: Wound #1 Right,Posterior Lower Leg: Elevate legs to the level of the heart and pump ankles as often as possible Wound #3 Right,Lateral Metatarsal head first: Elevate legs to the level of the heart and pump ankles as often as possible Wound #2 Right,Distal,Posterior Lower Leg: Elevate  legs to the level of the heart and pump ankles as often as possible Additional Orders / Instructions: Wound #1 Right,Posterior Lower Leg: Stop Smoking Increase protein intake. Wound #2 Right,Distal,Posterior Lower Leg: Stop Smoking Increase protein intake. Wound #3 Right,Lateral Metatarsal head first: Stop Smoking Increase protein intake. Medications-please add to medication list.: Wound #1 Right,Posterior Lower Leg: Santyl Enzymatic Ointment Vanderhoff, Fardowsa C. ([id number]) Other: - Vitamin C, Zinc, Multivitamin Wound #3 Right,Lateral Metatarsal head first: Santyl Enzymatic Ointment Other: - Vitamin C, Zinc, Multivitamin Wound #2 Right,Distal,Posterior Lower Leg: Santyl Enzymatic Ointment Other: - Vitamin C, Zinc, Multivitamin As before, the patient will not consent to a debridement and at this stage I had asked her to talk to her PCP regarding a possible surgical consultation for debridement under IV sedation. he saw Dr. Judeth Cornfield who recommended an dealing with wound care and seeing Dr. Wyn Quaker for a follow-up. I have recommended: 1. Santyl ointment to  be applied to the two wounds with dressing changes daily. 2. to see Dr. Wyn Quaker as soon as possible for a follow-up 3. Good control of her diabetes mellitus with a recent A1c to be done 4. I'm pleased to learn that she is still off smoking 5. regular visits the wound center -- I have recommended she takes an analgesic before she comes so that I can do a sharp debridement. Electronic Signature(s) Signed: 11/14/2016 3:23:01 PM By: Evlyn Kanner MD, FACS Entered By: Evlyn Kanner on 11/14/2016 15:23:00 Mittman, Barrie Folk ([id number]) -------------------------------------------------------------------------------- SuperBill Details Patient Name: Cynthia Hey C. Date of Service: 11/14/2016 Medical Record Number: [id number] Patient Account Number: [id number] Date of Birth/Sex: May 18, 1961 (56 y.o. 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 F17.218 Nicotine dependence, cigarettes, with other nicotine-induced disorders E66.01 Morbid (severe) obesity due to excess calories L97.512 Non-pressure chronic ulcer of other part of right foot with fat layer exposed Facility Procedures CPT4 Code: 08657846 Description: 99214 - WOUND CARE VISIT-LEV 4 EST PT Modifier: Quantity: 1 Physician Procedures CPT4 Code Description: [id number] 99213 - WC PHYS LEVEL 3 - EST PT ICD-10 Description Diagnosis E11.622 Type 2 diabetes mellitus with other skin ulcer L97.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

## 2016-11-14 NOTE — Progress Notes (Addendum)
KATHLEENA, FREEMAN ([id number]) Visit Report for 11/14/2016 Arrival Information Details Patient Name: [name], [name]. Date of Service: 11/14/2016 2:30 PM Medical Record Number: [id number] Patient Account Number: [id number] Date of Birth/Sex: Sep 01, 1961 (56 y.o. Female) Treating RN: Ashok Cordia, Debi Primary Care Jimmy Plessinger: Sandrea Hughs Other Clinician: Referring Eashan Schipani: Sandrea Hughs Treating Shana Zavaleta/Extender: Rudene Re in Treatment: 10 Visit Information History Since Last Visit All ordered tests and consults were completed: No Patient Arrived: Gilmer Mor Added or deleted any medications: No Arrival Time: 14:46 Any new allergies or adverse reactions: No Accompanied By: daughter Had a fall or experienced change in No Transfer Assistance: None activities of daily living that may affect Patient Identification Verified: Yes risk of falls: Secondary Verification Process Yes Signs or symptoms of abuse/neglect since last No Completed: visito Patient Requires Transmission- No Hospitalized since last visit: No Based Precautions: Has Dressing in Place as Prescribed: Yes Patient Has Alerts: Yes Pain Present Now: No Patient Alerts: Patient on Blood Thinner DM II Plavix Electronic Signature(s) Signed: 11/14/2016 4:38:07 PM By: Alejandro Mulling Entered By: Alejandro Mulling on 11/14/2016 14:47:44 Barraco, [name] ([id number]) -------------------------------------------------------------------------------- Clinic Level of Care Assessment Details Patient Name: Cynthia Hey C. Date of Service: 11/14/2016 2:30 PM Medical Record Number: [id number] Patient Account Number: [id number] Date of Birth/Sex: Jun 02, 1961 (56 y.o. Female) Treating RN: Ashok Cordia, Debi Primary Care Chizara Mena: Sandrea Hughs Other Clinician: Referring Saraiyah Hemminger: Sandrea Hughs Treating Dale Strausser/Extender: Rudene Re in Treatment: 10 Clinic Level of Care Assessment Items TOOL 4 Quantity Score X - Use when only an  EandM is performed on FOLLOW-UP visit 1 0 ASSESSMENTS - Nursing Assessment / Reassessment X - Reassessment of Co-morbidities (includes updates in patient status) 1 10 X - Reassessment of Adherence to Treatment Plan 1 5 ASSESSMENTS - Wound and Skin Assessment / Reassessment []  - Simple Wound Assessment / Reassessment - one wound 0 X - Complex Wound Assessment / Reassessment - multiple wounds 3 5 []  - Dermatologic / Skin Assessment (not related to wound area) 0 ASSESSMENTS - Focused Assessment []  - Circumferential Edema Measurements - multi extremities 0 []  - Nutritional Assessment / Counseling / Intervention 0 []  - Lower Extremity Assessment (monofilament, tuning fork, pulses) 0 []  - Peripheral Arterial Disease Assessment (using hand held doppler) 0 ASSESSMENTS - Ostomy and/or Continence Assessment and Care []  - Incontinence Assessment and Management 0 []  - Ostomy Care Assessment and Management (repouching, etc.) 0 PROCESS - Coordination of Care X - Simple Patient / Family Education for ongoing care 1 15 []  - Complex (extensive) Patient / Family Education for ongoing care 0 []  - Staff obtains Chiropractor, Records, Test Results / Process Orders 0 []  - Staff telephones HHA, Nursing Homes / Clarify orders / etc 0 []  - Routine Transfer to another Facility (non-emergent condition) 0 Baisch, Cynthia C. ([id number]) []  - Routine Hospital Admission (non-emergent condition) 0 []  - New Admissions / Manufacturing engineer / Ordering NPWT, Apligraf, etc. 0 []  - Emergency Hospital Admission (emergent condition) 0 X - Simple Discharge Coordination 1 10 []  - Complex (extensive) Discharge Coordination 0 PROCESS - Special Needs []  - Pediatric / Minor Patient Management 0 []  - Isolation Patient Management 0 []  - Hearing / Language / Visual special needs 0 []  - Assessment of Community assistance (transportation, D/C planning, etc.) 0 []  - Additional assistance / Altered mentation 0 []  - Support Surface(s)  Assessment (bed, cushion, seat, etc.) 0 INTERVENTIONS - Wound Cleansing / Measurement []  - Simple Wound Cleansing - one wound 0 X - Complex Wound Cleansing -  multiple wounds 3 5 X - Wound Imaging (photographs - any number of wounds) 1 5 []  - Wound Tracing (instead of photographs) 0 []  - Simple Wound Measurement - one wound 0 X - Complex Wound Measurement - multiple wounds 3 5 INTERVENTIONS - Wound Dressings X - Small Wound Dressing one or multiple wounds 3 10 []  - Medium Wound Dressing one or multiple wounds 0 []  - Large Wound Dressing one or multiple wounds 0 X - Application of Medications - topical 1 5 []  - Application of Medications - injection 0 INTERVENTIONS - Miscellaneous []  - External ear exam 0 Ozawa, Cynthia C. ([id number]) []  - Specimen Collection (cultures, biopsies, blood, body fluids, etc.) 0 []  - Specimen(s) / Culture(s) sent or taken to Lab for analysis 0 []  - Patient Transfer (multiple staff / Michiel Sites Lift / Similar devices) 0 []  - Simple Staple / Suture removal (25 or less) 0 []  - Complex Staple / Suture removal (26 or more) 0 []  - Hypo / Hyperglycemic Management (close monitor of Blood Glucose) 0 []  - Ankle / Brachial Index (ABI) - do not check if billed separately 0 X - Vital Signs 1 5 Has the patient been seen at the hospital within the last three years: Yes Total Score: 130 Level Of Care: New/Established - Level 4 Electronic Signature(s) Signed: 11/14/2016 4:38:07 PM By: Alejandro Mulling Entered By: Alejandro Mulling on 11/14/2016 15:31:23 Ferraz, [name] ([id number]) -------------------------------------------------------------------------------- Encounter Discharge Information Details Patient Name: Cynthia Hey C. Date of Service: 11/14/2016 2:30 PM Medical Record Number: [id number] Patient Account Number: [id number] Date of Birth/Sex: 11-Mar-1961 (56 y.o. Female) Treating RN: Ashok Cordia, Debi Primary Care Laylah Riga: Sandrea Hughs Other Clinician: Referring  Nichalos Brenton: Sandrea Hughs Treating Kimoni Pickerill/Extender: Rudene Re in Treatment: 10 Encounter Discharge Information Items Discharge Pain Level: 0 Discharge Condition: Stable Ambulatory Status: Cane Discharge Destination: Home Transportation: Private Auto Accompanied By: daughter Schedule Follow-up Appointment: Yes Medication Reconciliation completed No and provided to Patient/Care Jamesrobert Ohanesian: Provided on Clinical Summary of Care: 11/14/2016 Form Type Recipient Paper Patient VS Electronic Signature(s) Signed: 11/14/2016 3:22:38 PM By: Gwenlyn Perking Entered By: Gwenlyn Perking on 11/14/2016 15:22:38 Carrick, [name] ([id number]) -------------------------------------------------------------------------------- Lower Extremity Assessment Details Patient Name: [name], Zylee C. Date of Service: 11/14/2016 2:30 PM Medical Record Number: [id number] Patient Account Number: [id number] Date of Birth/Sex: 01/17/1961 (56 y.o. Female) Treating RN: Ashok Cordia, Debi Primary Care Destyn Parfitt: Sandrea Hughs Other Clinician: Referring Jacquelinne Speak: Sandrea Hughs Treating Zineb Glade/Extender: Rudene Re in Treatment: 10 Vascular Assessment Pulses: Dorsalis Pedis Palpable: [Right:Yes] Posterior Tibial Extremity colors, hair growth, and conditions: Extremity Color: [Right:Normal] Temperature of Extremity: [Right:Warm] Capillary Refill: [Right:< 3 seconds] Electronic Signature(s) Signed: 11/14/2016 4:38:07 PM By: Alejandro Mulling Entered By: Alejandro Mulling on 11/14/2016 15:00:51 Herda, [name] ([id number]) -------------------------------------------------------------------------------- Multi Wound Chart Details Patient Name: Cynthia Hey C. Date of Service: 11/14/2016 2:30 PM Medical Record Number: [id number] Patient Account Number: [id number] Date of Birth/Sex: Sep 17, 1961 (56 y.o. Female) Treating RN: Ashok Cordia, Debi Primary Care Rivkah Wolz: Sandrea Hughs Other Clinician: Referring Dylana Shaw:  Sandrea Hughs Treating Rowynn Mcweeney/Extender: Rudene Re in Treatment: 10 Vital Signs Height(in): 62 Pulse(bpm): 84 Weight(lbs): 276 Blood Pressure 110/68 (mmHg): Body Mass Index(BMI): 50 Temperature(F): 98.4 Respiratory Rate 20 (breaths/min): Photos: [1:No Photos] [2:No Photos] [3:No Photos] Wound Location: [1:Right Lower Leg - Posterior] [2:Right Lower Leg - Posterior, Distal] [3:Right Metatarsal head first - Lateral] Wounding Event: [1:Gradually Appeared] [2:Trauma] [3:Gradually Appeared] 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] [3:To be determined] Comorbid History: [1:Anemia, Chronic Obstructive Pulmonary Disease (  COPD), Sleep Disease (COPD), Sleep Disease (COPD), Sleep Apnea, Congestive Heart Apnea, Congestive Heart Apnea, Congestive Heart Failure, Coronary Artery Failure, Coronary Artery  Failure, Coronary Artery Disease, Hypertension, Peripheral Venous Disease, Type II Diabetes, Disease, Type II Diabetes, Disease, Type II Diabetes, Osteoarthritis, Neuropathy Osteoarthritis, Neuropathy Osteoarthritis, Neuropathy] [2:Anemia, Chronic  Obstructive Pulmonary Disease, Hypertension, Peripheral Venous] [3:Anemia, Chronic Obstructive Pulmonary Disease, Hypertension, Peripheral Venous] Date Acquired: [1:01/05/2016] [2:10/05/2016] [3:11/08/2016] Weeks of Treatment: [1:10] [2:5] [3:0] Wound Status: [1:Open] [2:Open] [3:Open] Measurements L x W x D 0.7x3.2x0.1 [2:1.3x0.7x0.1] [3:0.6x0.8x0.1] (cm) Area (cm) : [1:1.759] [2:0.715] [3:0.377] Volume (cm) : [1:0.176] [2:0.071] [3:0.038] % Reduction in Area: [1:-65.90%] [2:-176.10%] [3:N/A] % Reduction in Volume: -66.00% [2:-173.10%] [3:N/A] Classification: [1:Grade 1] [2:Full Thickness Without Exposed Support Structures] [3:Partial Thickness] HBO Classification: [1:N/A] [2:Grade 1] [3:Grade 2] Exudate Amount: [1:Large] [2:Large] [3:Large] Exudate Type: [1:Serous] [2:Serous] [3:Serous] Exudate  Color: amber amber amber Wound Margin: Distinct, outline attached Flat and Intact Distinct, outline attached Granulation Amount: None Present (0%) None Present (0%) None Present (0%) Necrotic Amount: Large (67-100%) Large (67-100%) Large (67-100%) Necrotic Tissue: Adherent Slough Eschar, Adherent Slough Eschar, Adherent Slough Exposed Structures: Fascia: No Fascia: No Fascia: No Fat Layer (Subcutaneous Fat Layer (Subcutaneous Fat Layer (Subcutaneous Tissue) Exposed: No Tissue) Exposed: No Tissue) Exposed: No Tendon: No Tendon: No Tendon: No Muscle: No Muscle: No Muscle: No Joint: No Joint: No Joint: No Bone: No Bone: No Bone: No Limited to Skin Limited to Skin Limited to Skin Breakdown Breakdown Breakdown Epithelialization: None None None Periwound Skin Texture: Excoriation: No Excoriation: No No Abnormalities Noted Induration: No Induration: No Callus: No Callus: No Crepitus: No Crepitus: No Rash: No Rash: No Scarring: No Scarring: No Periwound Skin Maceration: No Maceration: No No Abnormalities Noted Moisture: Dry/Scaly: No Dry/Scaly: No Periwound Skin Color: Erythema: Yes Atrophie Blanche: No No Abnormalities Noted Atrophie Blanche: No Cyanosis: No Cyanosis: No Ecchymosis: No Ecchymosis: No Erythema: No Hemosiderin Staining: No Hemosiderin Staining: No Mottled: No Mottled: No Pallor: No Pallor: No Rubor: No Rubor: No Erythema Location: Circumferential N/A N/A Temperature: No Abnormality No Abnormality No Abnormality Tenderness on Yes Yes Yes Palpation: Wound Preparation: Ulcer Cleansing: Ulcer Cleansing: Ulcer Cleansing: Rinsed/Irrigated with Rinsed/Irrigated with Rinsed/Irrigated with Saline Saline Saline Topical Anesthetic Topical Anesthetic Topical Anesthetic Applied: Other: lidocaine Applied: Other: lidocaine Applied: Other: lidocaine 4% 4% 4% Treatment Notes Wound #1 (Right, Posterior Lower Leg) 1. Cleansed with: Clean wound with  Normal Saline 2. Anesthetic Topical Lidocaine 4% cream to wound bed prior to debridement Christoph, Cynthia C. ([id number]) 3. Peri-wound Care: Skin Prep 4. Dressing Applied: Santyl Ointment 5. Secondary Dressing Applied Dry Gauze [place] Wound #2 (Right, Distal, Posterior Lower Leg) 1. Cleansed with: Clean wound with Normal Saline 2. Anesthetic Topical Lidocaine 4% cream to wound bed prior to debridement 3. Peri-wound Care: Skin Prep 4. Dressing Applied: Santyl Ointment 5. Secondary Dressing Applied Dry Gauze [place] Wound #3 (Right, Lateral Metatarsal head first) 1. Cleansed with: Clean wound with Normal Saline 2. Anesthetic Topical Lidocaine 4% cream to wound bed prior to debridement 3. Peri-wound Care: Skin Prep 4. Dressing Applied: Santyl Ointment 5. Secondary Dressing Applied Dry Gauze Notes band-aide Electronic Signature(s) Signed: 11/14/2016 3:18:06 PM By: Evlyn KannerBritto, Errol MD, FACS Entered By: Evlyn KannerBritto, Errol on 11/14/2016 15:18:06 Cynthia SchoolSUGGS, Cynthia C. ([id number]) -------------------------------------------------------------------------------- Multi-Disciplinary Care Plan Details Patient Name: Cynthia SchoolSUGGS, Cynthia C. Date of Service: 11/14/2016 2:30 PM Medical Record Number: [id number] Patient Account Number: [id number] Date of Birth/Sex: 07/23/1961 (56 y.o. Female) Treating RN: Ashok CordiaPinkerton, Debi Primary Care Rashied Corallo: Seward CarolUBIO, JESSICA  Other Clinician: Referring Payslee Bateson: Sandrea Hughs Treating Nana Vastine/Extender: Rudene Re in Treatment: 10 Active Inactive Electronic Signature(s) Signed: 12/13/2016 2:15:50 PM By: Elliot Gurney RN, BSN, Kim RN, BSN Signed: 01/24/2017 4:43:54 PM By: Alejandro Mulling Previous Signature: 11/14/2016 4:38:07 PM Version By: Alejandro Mulling Entered By: Elliot Gurney RN, BSN, Kim on 12/13/2016 14:15:49 Boulais, [name] ([id number]) -------------------------------------------------------------------------------- Pain Assessment Details Patient Name: [name],  Cynthia C. Date of Service: 11/14/2016 2:30 PM Medical Record Number: [id number] Patient Account Number: [id number] Date of Birth/Sex: [birth year] (56 y.o. Female) Treating RN: Ashok Cordia, Debi Primary Care Lachanda Buczek: Sandrea Hughs Other Clinician: Referring Anberlin Diez: Sandrea Hughs Treating Miner Koral/Extender: Rudene Re in Treatment: 10 Active Problems Location of Pain Severity and Description of Pain Patient Has Paino No Site Locations With Dressing Change: No Pain Management and Medication Current Pain Management: Electronic Signature(s) Signed: 11/14/2016 4:38:07 PM By: Alejandro Mulling Entered By: Alejandro Mulling on 11/14/2016 14:48:03 Trumpower, [name] ([id number]) -------------------------------------------------------------------------------- Patient/Caregiver Education Details Patient Name: [name]. Date of Service: 11/14/2016 2:30 PM Medical Record Number: [id number] Patient Account Number: [id number] Date of Birth/Gender: Oct 17, 1960 (56 y.o. Female) Treating RN: Ashok Cordia, Debi Primary Care Physician: Sandrea Hughs Other Clinician: Referring Physician: Sandrea Hughs Treating Physician/Extender: Rudene Re in Treatment: 10 Education Assessment Education Provided To: Patient Education Topics Provided Wound/Skin Impairment: Handouts: Other: change dressing as ordered Methods: Demonstration, Explain/Verbal Responses: State content correctly Electronic Signature(s) Signed: 11/14/2016 4:38:07 PM By: Alejandro Mulling Entered By: Alejandro Mulling on 11/14/2016 15:16:00 Keetch, [name] ([id number]) -------------------------------------------------------------------------------- Wound Assessment Details Patient Name: [name], Cynthia C. Date of Service: 11/14/2016 2:30 PM Medical Record Number: [id number] Patient Account Number: [id number] Date of Birth/Sex: 05-Apr-1961 (56 y.o. Female) Treating RN: Ashok Cordia, Debi Primary Care Tanicka Bisaillon: Sandrea Hughs Other  Clinician: Referring Kjirsten Bloodgood: Sandrea Hughs Treating Juergen Hardenbrook/Extender: Rudene Re in Treatment: 10 Wound Status Wound Number: 1 Primary Diabetic Wound/Ulcer of the Lower Etiology: Extremity Wound Location: Right Lower Leg - Posterior Wound Open Wounding Event: Gradually Appeared Status: Date Acquired: 01/05/2016 Comorbid Anemia, Chronic Obstructive Pulmonary Weeks Of Treatment: 10 History: Disease (COPD), Sleep Apnea, Clustered Wound: No Congestive Heart Failure, Coronary Artery Disease, Hypertension, Peripheral Venous Disease, Type II Diabetes, Osteoarthritis, Neuropathy Photos Photo Uploaded By: Alejandro Mulling on 11/14/2016 15:27:55 Wound Measurements Length: (cm) 0.7 Width: (cm) 3.2 Depth: (cm) 0.1 Area: (cm) 1.759 Volume: (cm) 0.176 % Reduction in Area: -65.9% % Reduction in Volume: -66% Epithelialization: None Tunneling: No Undermining: No Wound Description Classification: Grade 1 Foul Odor Afte Wound Margin: Distinct, outline attached Slough/Fibrino Exudate Amount: Large Exudate Type: Serous Exudate Color: amber r Cleansing: No Yes Wound Bed Granulation Amount: None Present (0%) Exposed Structure Bolt, Nykerria C. ([id number]) Necrotic Amount: Large (67-100%) Fascia Exposed: No Necrotic Quality: Adherent Slough Fat Layer (Subcutaneous Tissue) Exposed: No Tendon Exposed: No Muscle Exposed: No Joint Exposed: No Bone Exposed: No Limited to Skin Breakdown Periwound Skin Texture Texture Color No Abnormalities Noted: No No Abnormalities Noted: No Callus: No Atrophie Blanche: No Crepitus: No Cyanosis: No Excoriation: No Ecchymosis: No Induration: No Erythema: Yes Rash: No Erythema Location: Circumferential Scarring: No Hemosiderin Staining: No Mottled: No Moisture Pallor: No No Abnormalities Noted: No Rubor: No Dry / Scaly: No Maceration: No Temperature / Pain Temperature: No Abnormality Tenderness on Palpation: Yes Wound  Preparation Ulcer Cleansing: Rinsed/Irrigated with Saline Topical Anesthetic Applied: Other: lidocaine 4%, Electronic Signature(s) Signed: 11/14/2016 4:38:07 PM By: Alejandro Mulling Entered By: Alejandro Mulling on 11/14/2016 14:58:08 Ekstrand, [name] ([id number]) -------------------------------------------------------------------------------- Wound Assessment Details Patient Name: [name], Cynthia C. Date of Service:  11/14/2016 2:30 PM Medical Record Number: [id number] Patient Account Number: [id number] Date of Birth/Sex: 01-Apr-1961 (55 y.o. Female) Treating RN: Ashok Cordia, Debi Primary Care Alveta Quintela: Sandrea Hughs Other Clinician: Referring Ciel Yanes: Sandrea Hughs Treating Dicy Smigel/Extender: Rudene Re in Treatment: 10 Wound Status Wound Number: 2 Primary Venous Leg Ulcer Etiology: Wound Location: Right Lower Leg - Posterior, Distal Wound Open Status: Wounding Event: Trauma Comorbid Anemia, Chronic Obstructive Pulmonary Date Acquired: 10/05/2016 History: Disease (COPD), Sleep Apnea, Weeks Of Treatment: 5 Congestive Heart Failure, Coronary Clustered Wound: No Artery Disease, Hypertension, Peripheral Venous Disease, Type II Diabetes, Osteoarthritis, Neuropathy Photos Photo Uploaded By: Alejandro Mulling on 11/14/2016 15:27:55 Wound Measurements Length: (cm) 1.3 Width: (cm) 0.7 Depth: (cm) 0.1 Area: (cm) 0.715 Volume: (cm) 0.071 % Reduction in Area: -176.1% % Reduction in Volume: -173.1% Epithelialization: None Tunneling: No Undermining: No Wound Description Full Thickness Without Foul Odor Afte Classification: Exposed Support Structures Slough/Fibrino Diabetic Severity Grade 1 (Wagner): Wound Margin: Flat and Intact Exudate Amount: Large Exudate Type: Serous Exudate Color: amber Hemmer, Cynthia C. ([id number]) r Cleansing: No Yes Wound Bed Granulation Amount: None Present (0%) Exposed Structure Necrotic Amount: Large (67-100%) Fascia Exposed: No Necrotic  Quality: Eschar, Adherent Slough Fat Layer (Subcutaneous Tissue) Exposed: No Tendon Exposed: No Muscle Exposed: No Joint Exposed: No Bone Exposed: No Limited to Skin Breakdown Periwound Skin Texture Texture Color No Abnormalities Noted: No No Abnormalities Noted: No Callus: No Atrophie Blanche: No Crepitus: No Cyanosis: No Excoriation: No Ecchymosis: No Induration: No Erythema: No Rash: No Hemosiderin Staining: No Scarring: No Mottled: No Pallor: No Moisture Rubor: No No Abnormalities Noted: No Dry / Scaly: No Temperature / Pain Maceration: No Temperature: No Abnormality Tenderness on Palpation: Yes Wound Preparation Ulcer Cleansing: Rinsed/Irrigated with Saline Topical Anesthetic Applied: Other: lidocaine 4%, Electronic Signature(s) Signed: 11/14/2016 4:38:07 PM By: Alejandro Mulling Entered By: Alejandro Mulling on 11/14/2016 14:59:08 Levandoski, [name] ([id number]) -------------------------------------------------------------------------------- Wound Assessment Details Patient Name: [name], Cynthia C. Date of Service: 11/14/2016 2:30 PM Medical Record Number: [id number] Patient Account Number: [id number] Date of Birth/Sex: 07-23-1961 (56 y.o. Female) Treating RN: Ashok Cordia, Debi Primary Care Dyneshia Baccam: Sandrea Hughs Other Clinician: Referring Mccartney Brucks: Sandrea Hughs Treating Tyffani Foglesong/Extender: Rudene Re in Treatment: 10 Wound Status Wound Number: 3 Primary To be determined Etiology: Wound Location: Right Metatarsal head first - Lateral Wound Open Status: Wounding Event: Gradually Appeared Comorbid Anemia, Chronic Obstructive Pulmonary Date Acquired: 11/08/2016 History: Disease (COPD), Sleep Apnea, Weeks Of Treatment: 0 Congestive Heart Failure, Coronary Clustered Wound: No Artery Disease, Hypertension, Peripheral Venous Disease, Type II Diabetes, Osteoarthritis, Neuropathy Photos Photo Uploaded By: Alejandro Mulling on 11/14/2016 15:28:22 Wound  Measurements Length: (cm) 0.6 Width: (cm) 0.8 Depth: (cm) 0.1 Area: (cm) 0.377 Volume: (cm) 0.038 % Reduction in Area: % Reduction in Volume: Epithelialization: None Tunneling: No Undermining: No Wound Description Classification: Partial Thickness Foul O Diabetic Severity (Wagner): Grade 2 Slough Wound Margin: Distinct, outline attached Exudate Amount: Large Exudate Type: Serous Exudate Color: amber dor After Cleansing: No /Fibrino Yes Wound Bed Velaquez, Cynthia C. ([id number]) Granulation Amount: None Present (0%) Exposed Structure Necrotic Amount: Large (67-100%) Fascia Exposed: No Necrotic Quality: Eschar, Adherent Slough Fat Layer (Subcutaneous Tissue) Exposed: No Tendon Exposed: No Muscle Exposed: No Joint Exposed: No Bone Exposed: No Limited to Skin Breakdown Periwound Skin Texture Texture Color No Abnormalities Noted: No No Abnormalities Noted: No Moisture Temperature / Pain No Abnormalities Noted: No Temperature: No Abnormality Tenderness on Palpation: Yes Wound Preparation Ulcer Cleansing: Rinsed/Irrigated with Saline Topical Anesthetic Applied: Other: lidocaine 4%, Electronic Signature(s)  Signed: 11/14/2016 4:38:07 PM By: Alejandro Mulling Entered By: Alejandro Mulling on 11/14/2016 14:56:15 Metzger, [name] ([id number]) -------------------------------------------------------------------------------- Vitals Details Patient Name: Cynthia Hey C. Date of Service: 11/14/2016 2:30 PM Medical Record Number: [id number] Patient Account Number: [id number] Date of Birth/Sex: [birth year] (56 y.o. Female) Treating RN: Ashok Cordia, Debi Primary Care Justyn Langham: Sandrea Hughs Other Clinician: Referring Hydee Fleece: Sandrea Hughs Treating Tareek Sabo/Extender: Rudene Re in Treatment: 10 Vital Signs Time Taken: 14:48 Temperature (F): 98.4 Height (in): 62 Pulse (bpm): 84 Weight (lbs): 276 Respiratory Rate (breaths/min): 20 Body Mass Index (BMI): 50.5 Blood  Pressure (mmHg): 110/68 Reference Range: 80 - 120 mg / dl Electronic Signature(s) Signed: 11/14/2016 4:38:07 PM By: Alejandro Mulling Entered By: Alejandro Mulling on 11/14/2016 14:49:52

## 2016-11-22 ENCOUNTER — Ambulatory Visit (INDEPENDENT_AMBULATORY_CARE_PROVIDER_SITE_OTHER): Payer: Medicaid Other | Admitting: Vascular Surgery

## 2016-11-22 ENCOUNTER — Encounter (INDEPENDENT_AMBULATORY_CARE_PROVIDER_SITE_OTHER): Payer: Self-pay | Admitting: Vascular Surgery

## 2016-11-22 VITALS — BP 98/64 | HR 90 | Resp 17 | Ht 62.0 in | Wt 271.6 lb

## 2016-11-22 DIAGNOSIS — I10 Essential (primary) hypertension: Secondary | ICD-10-CM

## 2016-11-22 DIAGNOSIS — E118 Type 2 diabetes mellitus with unspecified complications: Secondary | ICD-10-CM

## 2016-11-22 DIAGNOSIS — I6523 Occlusion and stenosis of bilateral carotid arteries: Secondary | ICD-10-CM | POA: Diagnosis not present

## 2016-11-22 DIAGNOSIS — L97211 Non-pressure chronic ulcer of right calf limited to breakdown of skin: Secondary | ICD-10-CM | POA: Diagnosis not present

## 2016-11-22 DIAGNOSIS — I7025 Atherosclerosis of native arteries of other extremities with ulceration: Secondary | ICD-10-CM | POA: Diagnosis not present

## 2016-11-22 NOTE — Progress Notes (Signed)
MRN : [id number]  [name] is a 56 y.o. (26-Jul-1961) female who presents with chief complaint of  Chief Complaint  Patient presents with  . Wound Check  .  History of Present Illness: Patient returns today in follow up of Pain, swelling, and ulceration of the right lower extremity. She underwent revascularization about 2 months ago. Despite this, her wounds have really not healed much. She has gone to the emergency room both here and at [facility] and said she had a CT scan done [facility] which may have shown problems with her circulation on the right leg. She was being seen by the wound care center but has stopped going to them as she was told there was nothing more they could do. It sounds like they were pretty disappointed with her compliance. Her sugars have not been under good control but she has stopped smoking. She denies any left leg symptoms currently. She sought a second opinion from another surgeon in town who basically told her that she was getting good care and that she should return for evaluation with [place]. She had ABIs about a month ago that were 1.03 on the right and 1.00 on the left, but her digital pressures were reduced bilaterally. She is having more pain in the right calf as well as swelling. Santyl has not helped her wounds. She does not have fever or chills currently, but she has been on antibiotics. She is very frustrated and wants to the what we can do to help her wound.  Current Outpatient Prescriptions  Medication Sig Dispense Refill  . aspirin EC 81 MG tablet Take 81 mg by mouth daily.    Marland Kitchen buPROPion (WELLBUTRIN SR) 150 MG 12 hr tablet TAKE 1 TABLET BY MOUTH 3 TIMES A DAY FOR 3 DAYS THEN 1 TABLET TWICE A DAY AFTER  6  . BYDUREON 2 MG PEN INJECT SUBCUTANEOUSLY ONCE WEEKLY FOR HIGH BLOOD SUGAR  6  . carvedilol (COREG) 12.5 MG tablet Take 12.5 mg by mouth 2 (two) times daily with a meal.    . cetirizine (ZYRTEC) 10 MG tablet Take 10 mg by mouth daily.    .  cholecalciferol (VITAMIN D) 1000 UNITS tablet Take 1,000 Units by mouth daily.    . clindamycin (CLEOCIN) 300 MG capsule Take 1 capsule (300 mg total) by mouth 4 (four) times daily. 28 capsule 0  . clopidogrel (PLAVIX) 75 MG tablet Take 75 mg by mouth daily.    . cyclobenzaprine (FLEXERIL) 10 MG tablet Take 10 mg by mouth at bedtime.    . docusate sodium (COLACE) 100 MG capsule Take 100 mg by mouth 2 (two) times daily.    . enalapril (VASOTEC) 10 MG tablet 1 BY MOUTH DAILY FOR HIGH BLOOD PRESSURE    . enalapril (VASOTEC) 20 MG tablet Take 20 mg by mouth daily.    . fexofenadine (ALLEGRA) 180 MG tablet Take by mouth.    . furosemide (LASIX) 40 MG tablet Take 40 mg by mouth daily.    Marland Kitchen gabapentin (NEURONTIN) 300 MG capsule Take 300 mg by mouth daily as needed (for nerve pain).     Marland Kitchen glipiZIDE (GLUCOTROL) 10 MG tablet Take 10 mg by mouth 2 (two) times daily before a meal.    . insulin glargine (LANTUS) 100 UNIT/ML injection Inject 50 Units into the skin daily.    . metFORMIN (GLUCOPHAGE) 1000 MG tablet Take 1,000 mg by mouth 2 (two) times daily with a meal.    .  pantoprazole (PROTONIX) 40 MG tablet Take 40 mg by mouth daily.    . sertraline (ZOLOFT) 100 MG tablet Take 150 mg by mouth daily.     . simvastatin (ZOCOR) 40 MG tablet Take 40 mg by mouth daily.     . sitaGLIPtin (JANUVIA) 100 MG tablet Take 100 mg by mouth daily.    . traMADol (ULTRAM) 50 MG tablet Take 1 tablet (50 mg total) by mouth every 6 (six) hours as needed. 10 tablet 0  . traZODone (DESYREL) 50 MG tablet Take 50 mg by mouth at bedtime.     No current facility-administered medications for this visit.     Past Medical History:  Diagnosis Date  . Anemia   . Anxiety   . Arthritis   . CHF (congestive heart failure) ([place])   . COPD (chronic obstructive pulmonary disease) ([place])   . Coronary artery disease   . Diabetes mellitus without complication ([place])   . Fibromyalgia   . GERD  (gastroesophageal reflux disease)   . Hypertension   . Peripheral vascular disease ([place])   . Sleep apnea   . Stroke [facility])          Past Surgical History:  Procedure Laterality Date  . CESAREAN SECTION    . CORONARY ANGIOPLASTY WITH STENT PLACEMENT Left   . DILATION AND CURETTAGE OF UTERUS    . ENDARTERECTOMY Left 01/29/2015   Procedure: ENDARTERECTOMY CAROTID;  Surgeon: Algernon Huxley, MD;  Location: [facility] ORS;  Service: Vascular;  Laterality: Left;  . PERIPHERAL VASCULAR CATHETERIZATION Right 05/27/2015   Procedure: Lower Extremity Angiography;  Surgeon: Algernon Huxley, MD;  Location: [place] CV LAB;  Service: Cardiovascular;  Laterality: Right;  . PERIPHERAL VASCULAR CATHETERIZATION  05/27/2015   Procedure: Lower Extremity Intervention;  Surgeon: Algernon Huxley, MD;  Location: [place] CV LAB;  Service: Cardiovascular;;  . TUBAL LIGATION      Social History      Social History  Substance Use Topics  . Smoking status: Current Some Day Smoker    Packs/day: 0.25    Years: 38.00    Types: Cigarettes  . Smokeless tobacco: Current User  . Alcohol use No  No IV drug use  Family History No bleeding disorders, clotting disorders, autoimmune diseases, or aneurysms      Allergies  Allergen Reactions  . Niacin And Related Nausea And Vomiting     REVIEW OF SYSTEMS (Negative unless checked)  Constitutional: _0 Weight loss  _1 Fever  _2 Chills Cardiac: _3 Chest pain   _4 Chest pressure   _5 Palpitations   _6 Shortness of breath when laying flat   _7 Shortness of breath at rest   _8 Shortness of breath with exertion. Vascular:  _9 Pain in legs with walking   _10 Pain in legs at rest   _11 Pain in legs when laying flat   _12 Claudication   _13 Pain in feet when walking  _14 Pain in feet at rest  _15 Pain in feet when laying flat   _16 History of DVT   _17 Phlebitis   _18 Swelling in legs   _19 Varicose veins   _20 Non-healing ulcers Pulmonary:   _21 Uses home oxygen   _22 Productive  cough   _23 Hemoptysis   _24 Wheeze  _25 COPD   _26 Asthma Neurologic:  _27 Dizziness  _28 Blackouts   _29 Seizures   _30 History of stroke   _31 History of TIA  _32 Aphasia   _33 Temporary blindness   _34 Dysphagia   _35 Weakness or numbness in arms   _36 Weakness or numbness in legs Musculoskeletal:  _37 Arthritis   _38 Joint swelling   _39 Joint pain   _40   Low Gray pain Hematologic:  _0 Easy bruising  _1 Easy bleeding   _2 Hypercoagulable state   _3 Anemic   Gastrointestinal:  _4 Blood in stool   _5 Vomiting blood  _6 Gastroesophageal reflux/heartburn   _7 Abdominal pain Genitourinary:  _8 Chronic kidney disease   _9 Difficult urination  _10 Frequent urination  _11 Burning with urination   _12 Hematuria Skin:  _13 Rashes   _14 Ulcers   _15 Wounds Psychological:  _16 History of anxiety   _17  History of major depression.  Physical Examination  BP 109/72 (BP Location: Right Arm)   Pulse 84   Resp 17   Ht _18  (1.575 m)   Wt 268 lb (121.6 kg)   BMI 49.02 kg/m  Gen:  WD/WN, massive obese WF. Head: [place]/AT, No temporalis wasting. Ear/Nose/Throat: Hearing grossly intact, nares w/o erythema or drainage, trachea midline Eyes: Conjunctiva clear. Sclera non-icteric Neck: Supple.  No JVD.  Pulmonary:  Good air movement, no use of accessory muscles.  Cardiac: RRR, normal S1, S2 Vascular:  Vessel Right Left  Radial Palpable Palpable  Ulnar Palpable Palpable  Brachial Palpable Palpable  Carotid Palpable, without bruit Palpable, without bruit  Aorta Not palpable N/A  Femoral Palpable Palpable  Popliteal Not Palpable 1+ Palpable  PT 1+ Palpable Not Palpable  DP 1+ Palpable 1+ Palpable   Gastrointestinal: soft, non-tender/non-distended. No guarding/reflex.  Musculoskeletal: M/S 5/5 throughout.  No deformity or atrophy. 2 Pale ulceration on the right posterior calf with poor granulation tissue and some fibrinous exudate. slightly enlarged from previous visit.  Now with drainage from her right great toe area as well.  Sensation grossly intact in  extremities.  Symmetrical.  Speech is fluent. 2-3+ RLE swelling.  1+LLE swelling.  Psychiatric: Judgment intact, Mood & affect appropriate for pt's clinical situation. Dermatologic: Right calf ulceration as described above Lymphatic : No Cervical, Axillary, or Inguinal lymphadenopathy.    Labs Recent Results (from the past 2160 hour(s))  BUN     Status: None   Collection Time: 09/20/16  2:31 PM  Result Value Ref Range   BUN 18 6 - 20 mg/dL  Creatinine, serum     Status: None   Collection Time: 09/20/16  2:31 PM  Result Value Ref Range   Creatinine, Ser 0.88 0.44 - 1.00 mg/dL   GFR calc non Af Amer >60 >60 mL/min   GFR calc Af Amer >60 >60 mL/min    Comment: (NOTE) The eGFR has been calculated using the CKD EPI equation. This calculation has not been validated in all clinical situations. eGFR's persistently <60 mL/min signify possible Chronic Kidney Disease.   Glucose, capillary     Status: Abnormal   Collection Time: 09/22/16  9:18 AM  Result Value Ref Range   Glucose-Capillary 216 (H) 65 - 99 mg/dL  Glucose, capillary     Status: Abnormal   Collection Time: 10/04/16  5:52 PM  Result Value Ref Range   Glucose-Capillary 241 (H) 65 - 99 mg/dL    Radiology No results found.    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 Checked last year and doing well. To be checked again later this year.  Diabetes ([employer]) blood glucose control important in reducing the progression of atherosclerotic disease. Also, involved in wound healing. On appropriate medications.   Essential hypertension, benign blood pressure control important in reducing the progression of atherosclerotic disease. On appropriate oral medications.  Lower limb ulcer, calf, right, limited to breakdown of skin ([place]) With prominent swelling. Perfusion was found to be reasonable last month. 3 layer Unna boot was placed  on the right lower extremity today and will be changed weekly.  Plan to recheck in 3-4 weeks.  Atherosclerosis of native arteries of the extremities with ulceration ([place]) Perfusion was found to be reasonable last month. 3 layer Unna boot was placed on the right lower extremity today and will be changed weekly. Plan to recheck in 3-4 weeks. If the wounds do not improve with several weeks of Unna boots, I would then plan a repeat angiogram for further evaluation particularly given the suspicion from a study in [place] that her perfusion may not be adequate.    Leotis Pain, MD  11/22/2016 4:11 PM    This note was created with Dragon medical transcription system.  Any errors from dictation are purely unintentional

## 2016-11-22 NOTE — Assessment & Plan Note (Signed)
Perfusion was found to be reasonable last month. 3 layer Unna boot was placed on the right lower extremity today and will be changed weekly. Plan to recheck in 3-4 weeks. If the wounds do not improve with several weeks of Unna boots, I would then plan a repeat angiogram for further evaluation particularly given the suspicion from a study in [place] that her perfusion may not be adequate.

## 2016-11-22 NOTE — Assessment & Plan Note (Signed)
With prominent swelling. Perfusion was found to be reasonable last month. 3 layer Unna boot was placed on the right lower extremity today and will be changed weekly. Plan to recheck in 3-4 weeks.

## 2016-11-29 ENCOUNTER — Ambulatory Visit (INDEPENDENT_AMBULATORY_CARE_PROVIDER_SITE_OTHER): Payer: Medicaid Other | Admitting: Vascular Surgery

## 2016-11-29 ENCOUNTER — Encounter (INDEPENDENT_AMBULATORY_CARE_PROVIDER_SITE_OTHER): Payer: Self-pay | Admitting: Vascular Surgery

## 2016-11-29 VITALS — BP 137/76 | HR 98 | Resp 16 | Ht 61.0 in | Wt 273.0 lb

## 2016-11-29 DIAGNOSIS — L97211 Non-pressure chronic ulcer of right calf limited to breakdown of skin: Secondary | ICD-10-CM | POA: Diagnosis not present

## 2016-11-29 NOTE — Progress Notes (Signed)
History of Present Illness  There is no documented history at this time  Assessments & Plan   There are no diagnoses linked to this encounter.    Additional instructions  Subjective:  Patient presents with venous ulcer of the Right lower extremity.    Procedure:  3 layer unna wrap was placed Right lower extremity.   Plan:   Follow up in one week.   

## 2016-12-01 ENCOUNTER — Telehealth (INDEPENDENT_AMBULATORY_CARE_PROVIDER_SITE_OTHER): Payer: Self-pay

## 2016-12-01 NOTE — Telephone Encounter (Signed)
Patient called wanting another prescription for Tramadol.

## 2016-12-01 NOTE — Telephone Encounter (Signed)
Patient was informed of prescription being called into pharmacy. See below.

## 2016-12-01 NOTE — Telephone Encounter (Signed)
Since she has an ulcer located on her lower extremity which we are treating with unna wraps Tramadol is OK to help with the discomfort.   Tramadol 50mg   One to Two Tabs By Mouth  Every Six Hours As Needed For Pain #60  Thanks.

## 2016-12-06 ENCOUNTER — Encounter (INDEPENDENT_AMBULATORY_CARE_PROVIDER_SITE_OTHER): Payer: Self-pay

## 2016-12-06 ENCOUNTER — Ambulatory Visit (INDEPENDENT_AMBULATORY_CARE_PROVIDER_SITE_OTHER): Payer: Medicaid Other | Admitting: Vascular Surgery

## 2016-12-06 VITALS — BP 114/73 | HR 112 | Resp 16 | Wt 273.0 lb

## 2016-12-06 DIAGNOSIS — L97211 Non-pressure chronic ulcer of right calf limited to breakdown of skin: Secondary | ICD-10-CM | POA: Diagnosis not present

## 2016-12-06 NOTE — Progress Notes (Signed)
History of Present Illness  There is no documented history at this time  Assessments & Plan   There are no diagnoses linked to this encounter.    Additional instructions  Subjective:  Patient presents with venous ulcer of the Right lower extremity.    Procedure:  3 layer unna wrap was placed Right lower extremity.   Plan:   Follow up in one week.   

## 2016-12-13 ENCOUNTER — Encounter (INDEPENDENT_AMBULATORY_CARE_PROVIDER_SITE_OTHER): Payer: Self-pay | Admitting: Vascular Surgery

## 2016-12-13 ENCOUNTER — Ambulatory Visit (INDEPENDENT_AMBULATORY_CARE_PROVIDER_SITE_OTHER): Payer: Medicaid Other | Admitting: Vascular Surgery

## 2016-12-13 VITALS — BP 127/81 | HR 97 | Resp 16 | Ht 64.0 in | Wt 268.0 lb

## 2016-12-13 DIAGNOSIS — L97211 Non-pressure chronic ulcer of right calf limited to breakdown of skin: Secondary | ICD-10-CM | POA: Diagnosis not present

## 2016-12-13 NOTE — Progress Notes (Signed)
History of Present Illness  There is no documented history at this time  Assessments & Plan   There are no diagnoses linked to this encounter.    Additional instructions  Subjective:  Patient presents with venous ulcer of the Right lower extremity.    Procedure:  3 layer unna wrap was placed Right lower extremity.   Plan:   Follow up in one week.   

## 2016-12-20 ENCOUNTER — Ambulatory Visit (INDEPENDENT_AMBULATORY_CARE_PROVIDER_SITE_OTHER): Payer: Medicaid Other | Admitting: Vascular Surgery

## 2016-12-20 ENCOUNTER — Encounter (INDEPENDENT_AMBULATORY_CARE_PROVIDER_SITE_OTHER): Payer: Self-pay | Admitting: Vascular Surgery

## 2016-12-20 VITALS — BP 137/82 | HR 79 | Resp 18 | Ht 64.0 in | Wt 269.0 lb

## 2016-12-20 DIAGNOSIS — E118 Type 2 diabetes mellitus with unspecified complications: Secondary | ICD-10-CM | POA: Diagnosis not present

## 2016-12-20 DIAGNOSIS — S91109D Unspecified open wound of unspecified toe(s) without damage to nail, subsequent encounter: Secondary | ICD-10-CM | POA: Diagnosis not present

## 2016-12-20 DIAGNOSIS — S91109A Unspecified open wound of unspecified toe(s) without damage to nail, initial encounter: Secondary | ICD-10-CM | POA: Insufficient documentation

## 2016-12-20 DIAGNOSIS — L97211 Non-pressure chronic ulcer of right calf limited to breakdown of skin: Secondary | ICD-10-CM | POA: Diagnosis not present

## 2016-12-20 NOTE — Progress Notes (Signed)
Subjective:    Patient ID: [name], female    DOB: 04/15/1961, 56 y.o.   MRN: [id number] Chief Complaint  Patient presents with  . Re-evaluation    Unna check   Patient presents for an unna wrap / wound check. She has been receiving weekly RLE unna wraps for two ulcerations on the back of her right shin. She is also concerned about right first toe wound. She has been treating the wound with santyl. Denies any fever, nausea or vomiting. Patient does not have a podiatrist. Recent ABI's with normal arterial blood flow. Patient is diabetic.    Review of Systems  Constitutional: Negative.   HENT: Negative.   Eyes: Negative.   Respiratory: Negative.   Cardiovascular: Positive for leg swelling.  Gastrointestinal: Negative.   Endocrine: Negative.   Genitourinary: Negative.   Musculoskeletal: Negative.   Skin: Positive for wound.  Allergic/Immunologic: Negative.   Neurological: Negative.   Hematological: Negative.   Psychiatric/Behavioral: Negative.       Objective:   Physical Exam  Constitutional: She is oriented to Gray, place, and time. She appears well-developed and well-nourished. No distress.  HENT:  Head: Normocephalic and atraumatic.  Eyes: Conjunctivae are normal. Pupils are equal, round, and reactive to light.  Neck: Normal range of motion.  Cardiovascular: Normal rate, regular rhythm and normal heart sounds.   Hard to appreciate pedal pulses due to body habitus.  Pulmonary/Chest: Effort normal.  Musculoskeletal: Normal range of motion. She exhibits edema (Moderate Bilateral Edema. No cellulitis. No new ulcer formation.).  Neurological: She is alert and oriented to Gray, place, and time.  Skin: She is not diaphoretic.  RLE: Two ulcerations on back of skin. Healthily wound bed. Granulation tissue present. No infection noted. No drainage noted.  Right First Toe: Lateral toe with shallow wound. Fibrinous wound bed. No infection noted. No drainage noted. Surrounding  skin macerated.   Psychiatric: She has a normal mood and affect. Her behavior is normal. Judgment and thought content normal.   BP 137/82 (BP Location: Right Arm)   Pulse 79   Resp 18   Ht 5\' 4"  (1.626 m)   Wt 122 kg (269 lb)   BMI 46.17 kg/m   Past Medical History:  Diagnosis Date  . Anemia   . Anxiety   . Arthritis   . CHF (congestive heart failure) ([facility])   . COPD (chronic obstructive pulmonary disease) ([facility])   . Coronary artery disease   . Diabetes mellitus without complication ([facility])   . Fibromyalgia   . GERD (gastroesophageal reflux disease)   . Hypertension   . Peripheral vascular disease ([facility])   . Sleep apnea   . Stroke [facility])    Social History   Social History  . Marital status: Divorced    Spouse name: N/A  . Number of children: N/A  . Years of education: N/A   Occupational History  . Not on file.   Social History Main Topics  . Smoking status: Former Smoker    Packs/day: 0.25    Years: 38.00    Types: Cigarettes    Quit date: 09/12/2016  . Smokeless tobacco: Current User  . Alcohol use No  . Drug use: No  . Sexual activity: Not on file   Other Topics Concern  . Not on file   Social History Narrative  . No narrative on file   Past Surgical History:  Procedure Laterality Date  . CESAREAN SECTION    . CORONARY ANGIOPLASTY WITH STENT  PLACEMENT Left   . DILATION AND CURETTAGE OF UTERUS    . ENDARTERECTOMY Left 01/29/2015   Procedure: ENDARTERECTOMY CAROTID;  Surgeon: Annice Needy, MD;  Location: [facility] ORS;  Service: Vascular;  Laterality: Left;  . PERIPHERAL VASCULAR CATHETERIZATION Right 05/27/2015   Procedure: Lower Extremity Angiography;  Surgeon: Annice Needy, MD;  Location: [facility] INVASIVE CV LAB;  Service: Cardiovascular;  Laterality: Right;  . PERIPHERAL VASCULAR CATHETERIZATION  05/27/2015   Procedure: Lower Extremity Intervention;  Surgeon: Annice Needy, MD;  Location: [facility] INVASIVE CV LAB;  Service: Cardiovascular;;  . PERIPHERAL VASCULAR  CATHETERIZATION Right 09/22/2016   Procedure: Lower Extremity Angiography;  Surgeon: Annice Needy, MD;  Location: [facility] INVASIVE CV LAB;  Service: Cardiovascular;  Laterality: Right;  . TUBAL LIGATION     No family history on file.  Allergies  Allergen Reactions  . Niacin And Related Nausea And Vomiting      Assessment & Plan:  Patient presents for an unna wrap / wound check. She has been receiving weekly RLE unna wraps for two ulcerations on the back of her right shin. She is also concerned about right first toe wound. She has been treating the wound with santyl. Denies any fever, nausea or vomiting. Patient does not have a podiatrist. Recent ABI's with normal arterial blood flow. Patient is diabetic.   1. Lower limb ulcer, calf, right, limited to breakdown of skin ([facility]) - Improvement Ulcerations with improvement however not fully healed.  Recommend continued unna boot therapy.  Encouraged elevation. Follow up in one month.   2. Type 2 diabetes mellitus with complication, unspecified long term insulin use status ([facility]) - Stable Encouraged good control as its slows the progression of atherosclerotic disease and impedes wound healing.  3. Open wound of toe, subsequent encounter - Stable Continue Santyl to wound.  Patient with macerated skin around wound. Encouraged placement of Santyl to just wound bed. Patient encouraged to leave toe open to air as much as possible when not walking or in shoe. Recommend follow up with Podiatrist.  Patient would like to see Dr. Alberteen Spindle - gave patient information to make an appointment.   Current Outpatient Prescriptions on File Prior to Visit  Medication Sig Dispense Refill  . aspirin EC 81 MG tablet Take 81 mg by mouth daily.    Marland Kitchen buPROPion (WELLBUTRIN SR) 150 MG 12 hr tablet TAKE 1 TABLET BY MOUTH 3 TIMES A DAY FOR 3 DAYS THEN 1 TABLET TWICE A DAY AFTER  6  . BYDUREON 2 MG PEN INJECT SUBCUTANEOUSLY ONCE WEEKLY FOR HIGH BLOOD SUGAR  6  . carvedilol  (COREG) 3.125 MG tablet Take 3.125 mg by mouth 2 (two) times daily with a meal.     . clopidogrel (PLAVIX) 75 MG tablet Take 75 mg by mouth daily.    . enalapril (VASOTEC) 10 MG tablet 1 BY MOUTH DAILY FOR HIGH BLOOD PRESSURE    . fexofenadine (ALLEGRA) 180 MG tablet Take by mouth.    . furosemide (LASIX) 40 MG tablet Take 40 mg by mouth daily.    Marland Kitchen gabapentin (NEURONTIN) 300 MG capsule Take 300 mg by mouth daily as needed (for nerve pain).     Marland Kitchen glipiZIDE (GLUCOTROL) 10 MG tablet 1 BY MOUTH TWICE A DAY FOR DIABETES  6  . HUMULIN R U-500 KWIKPEN 500 UNIT/ML kwikpen INJECT 100 UNITS BEFORE BREAKFAST, 50 UNITS BEFORE SUPPER.  3  . ibuprofen (ADVIL,MOTRIN) 200 MG tablet Take 200 mg by mouth every 6 (six)  hours as needed.    . insulin regular (NOVOLIN R,HUMULIN R) 250 units/2.22mL (100 units/mL) injection Inject 50-100 Units into the skin 2 (two) times daily before a meal. Inject 100 units before breakfast and 50 units before supper.    Marland Kitchen JANUVIA 100 MG tablet 1 BY MOUTH DAILY FOR DIABETES  6  . metFORMIN (GLUCOPHAGE) 1000 MG tablet Take 1,000 mg by mouth 2 (two) times daily with a meal.    . oxyCODONE-acetaminophen (PERCOCET/ROXICET) 5-325 MG tablet Take 1 tablet by mouth every 6 (six) hours as needed. for pain  0  . pantoprazole (PROTONIX) 40 MG tablet Take 40 mg by mouth daily.    Marland Kitchen SANTYL ointment OINTMENT TOPICAL AS DIRECTED  0  . sertraline (ZOLOFT) 100 MG tablet Take 150 mg by mouth daily.     . simvastatin (ZOCOR) 40 MG tablet Take 40 mg by mouth daily.     . traMADol (ULTRAM) 50 MG tablet Take 1 tablet (50 mg total) by mouth every 6 (six) hours as needed. 10 tablet 0  . traZODone (DESYREL) 50 MG tablet Take 50 mg by mouth at bedtime.     No current facility-administered medications on file prior to visit.     There are no Patient Instructions on file for this visit. No Follow-up on file.   Cynthia Cart A Rosaleah Person, PA-C

## 2016-12-27 ENCOUNTER — Encounter (INDEPENDENT_AMBULATORY_CARE_PROVIDER_SITE_OTHER): Payer: Self-pay

## 2016-12-27 ENCOUNTER — Ambulatory Visit (INDEPENDENT_AMBULATORY_CARE_PROVIDER_SITE_OTHER): Payer: Medicaid Other | Admitting: Vascular Surgery

## 2016-12-27 VITALS — BP 113/73 | HR 74 | Resp 17 | Ht 62.0 in | Wt 271.0 lb

## 2016-12-27 DIAGNOSIS — L97211 Non-pressure chronic ulcer of right calf limited to breakdown of skin: Secondary | ICD-10-CM

## 2016-12-27 NOTE — Progress Notes (Signed)
History of Present Illness  There is no documented history at this time  Assessments & Plan   There are no diagnoses linked to this encounter.    Additional instructions  Subjective:  Patient presents with venous ulcer of the Right lower extremity.    Procedure:  3 layer unna wrap was placed Right lower extremity.   Plan:   Follow up in one week.   

## 2017-01-03 ENCOUNTER — Ambulatory Visit (INDEPENDENT_AMBULATORY_CARE_PROVIDER_SITE_OTHER): Payer: Medicaid Other | Admitting: Vascular Surgery

## 2017-01-03 ENCOUNTER — Encounter (INDEPENDENT_AMBULATORY_CARE_PROVIDER_SITE_OTHER): Payer: Self-pay

## 2017-01-03 VITALS — BP 127/83 | HR 74 | Resp 16 | Ht 62.0 in | Wt 270.0 lb

## 2017-01-03 DIAGNOSIS — L97211 Non-pressure chronic ulcer of right calf limited to breakdown of skin: Secondary | ICD-10-CM

## 2017-01-03 NOTE — Progress Notes (Signed)
History of Present Illness  There is no documented history at this time  Assessments & Plan   There are no diagnoses linked to this encounter.    Additional instructions  Subjective:  Patient presents with venous ulcer of the Right lower extremity.    Procedure:  3 layer unna wrap was placed Right lower extremity.   Plan:   Follow up in one week.   

## 2017-01-10 ENCOUNTER — Encounter (INDEPENDENT_AMBULATORY_CARE_PROVIDER_SITE_OTHER): Payer: Self-pay | Admitting: Vascular Surgery

## 2017-01-10 ENCOUNTER — Ambulatory Visit (INDEPENDENT_AMBULATORY_CARE_PROVIDER_SITE_OTHER): Payer: Medicaid Other | Admitting: Vascular Surgery

## 2017-01-10 VITALS — BP 136/84 | HR 82 | Resp 18 | Ht 61.0 in | Wt 270.0 lb

## 2017-01-10 DIAGNOSIS — L97211 Non-pressure chronic ulcer of right calf limited to breakdown of skin: Secondary | ICD-10-CM | POA: Diagnosis not present

## 2017-01-10 NOTE — Progress Notes (Signed)
History of Present Illness  There is no documented history at this time  Assessments & Plan   There are no diagnoses linked to this encounter.    Additional instructions  Subjective:  Patient presents with venous ulcer of the Right lower extremity.    Procedure:  3 layer unna wrap was placed Right lower extremity.   Plan:   Follow up in one week.   

## 2017-01-17 ENCOUNTER — Ambulatory Visit (INDEPENDENT_AMBULATORY_CARE_PROVIDER_SITE_OTHER): Payer: Medicaid Other | Admitting: Vascular Surgery

## 2017-02-14 ENCOUNTER — Ambulatory Visit (INDEPENDENT_AMBULATORY_CARE_PROVIDER_SITE_OTHER): Payer: Medicaid Other | Admitting: Vascular Surgery

## 2017-02-14 VITALS — BP 125/74 | HR 99 | Resp 16 | Ht 63.0 in | Wt 260.0 lb

## 2017-02-14 DIAGNOSIS — I739 Peripheral vascular disease, unspecified: Secondary | ICD-10-CM | POA: Diagnosis not present

## 2017-02-14 DIAGNOSIS — I10 Essential (primary) hypertension: Secondary | ICD-10-CM | POA: Diagnosis not present

## 2017-02-14 DIAGNOSIS — E118 Type 2 diabetes mellitus with unspecified complications: Secondary | ICD-10-CM

## 2017-02-14 DIAGNOSIS — I6523 Occlusion and stenosis of bilateral carotid arteries: Secondary | ICD-10-CM | POA: Diagnosis not present

## 2017-02-14 NOTE — Assessment & Plan Note (Signed)
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 She lost due to uncontrollable infection of the right foot despite attempts at previous revascularization. I would plan to recheck her perfusion in about 3 months with noninvasive studies on the left leg as she has known PAD there as well.

## 2017-02-14 NOTE — Progress Notes (Signed)
MRN : [id number]  [name] is a 56 y.o. (1961/03/18) female who presents with chief complaint of  Chief Complaint  Patient presents with  . Re-evaluation    Follow up  .  History of Present Illness: Patient returns today in follow up of PAD. Since her last visit, she has undergone a right below-knee amputation at [facility]. They attempted salvage the foot, but the infection was too severe. She reports feeling worlds better than she did. She did not realize how sick that chronic ulceration and infection was making her. Her skin is healing well and she is in the rehabilitation process now at home. She has no new ulcerations or infection.  Current Outpatient Prescriptions  Medication Sig Dispense Refill  . ACCU-CHEK AVIVA PLUS test strip See admin instructions.  99  . aspirin EC 81 MG tablet Take 81 mg by mouth daily.    Marland Kitchen buPROPion (WELLBUTRIN SR) 150 MG 12 hr tablet TAKE 1 TABLET BY MOUTH 3 TIMES A DAY FOR 3 DAYS THEN 1 TABLET TWICE A DAY AFTER  6  . BYDUREON 2 MG PEN INJECT SUBCUTANEOUSLY ONCE WEEKLY FOR HIGH BLOOD SUGAR  6  . carvedilol (COREG) 3.125 MG tablet Take 3.125 mg by mouth 2 (two) times daily with a meal.     . clopidogrel (PLAVIX) 75 MG tablet Take 75 mg by mouth daily.    . enalapril (VASOTEC) 10 MG tablet 1 BY MOUTH DAILY FOR HIGH BLOOD PRESSURE    . fexofenadine (ALLEGRA) 180 MG tablet Take by mouth.    . furosemide (LASIX) 40 MG tablet Take 40 mg by mouth daily.    Marland Kitchen gabapentin (NEURONTIN) 300 MG capsule Take 300 mg by mouth daily as needed (for nerve pain).     Marland Kitchen glipiZIDE (GLUCOTROL) 10 MG tablet 1 BY MOUTH TWICE A DAY FOR DIABETES  6  . HUMULIN R U-500 KWIKPEN 500 UNIT/ML kwikpen INJECT 100 UNITS BEFORE BREAKFAST, 50 UNITS BEFORE SUPPER.  3  . ibuprofen (ADVIL,MOTRIN) 200 MG tablet Take 200 mg by mouth every 6 (six) hours as needed.    Marland Kitchen JANUVIA 100 MG tablet 1 BY MOUTH DAILY FOR DIABETES  6  . metFORMIN (GLUCOPHAGE) 1000 MG tablet Take 1,000 mg by mouth 2  (two) times daily with a meal.    . methocarbamol (ROBAXIN) 500 MG tablet 1-2 BY MOUTH EVERY 6 HOURS AS NEEDED FOR MUSCLE SPASM  1  . naproxen (NAPROSYN) 500 MG tablet 1 BY MOUTH TWICE DAILY FOR 10 DAYS THEN AS NEEDED FOR PAIN  0  . oxyCODONE-acetaminophen (PERCOCET/ROXICET) 5-325 MG tablet Take 1 tablet by mouth every 6 (six) hours as needed. for pain  0  . pantoprazole (PROTONIX) 40 MG tablet Take 40 mg by mouth daily.    Marland Kitchen SANTYL ointment OINTMENT TOPICAL AS DIRECTED  0  . sertraline (ZOLOFT) 100 MG tablet Take 150 mg by mouth daily.     . simvastatin (ZOCOR) 40 MG tablet Take 40 mg by mouth daily.     . traMADol (ULTRAM) 50 MG tablet Take 1 tablet (50 mg total) by mouth every 6 (six) hours as needed. 10 tablet 0  . traZODone (DESYREL) 50 MG tablet Take 50 mg by mouth at bedtime.     No current facility-administered medications for this visit.     Past Medical History:  Diagnosis Date  . Anemia   . Anxiety   . Arthritis   . CHF (congestive heart failure) (HCC)   .  COPD (chronic obstructive pulmonary disease) (HCC)   . Coronary artery disease   . Diabetes mellitus without complication (HCC)   . Fibromyalgia   . GERD (gastroesophageal reflux disease)   . Hypertension   . Peripheral vascular disease (HCC)   . Sleep apnea   . Stroke [facility](HCC)     Past Surgical History:  Procedure Laterality Date  . CESAREAN SECTION    . CORONARY ANGIOPLASTY WITH STENT PLACEMENT Left   . DILATION AND CURETTAGE OF UTERUS    . ENDARTERECTOMY Left 01/29/2015   Procedure: ENDARTERECTOMY CAROTID;  Surgeon: Annice NeedyJason S Tarshia Kot, MD;  Location: [facility] ORS;  Service: Vascular;  Laterality: Left;  . PERIPHERAL VASCULAR CATHETERIZATION Right 05/27/2015   Procedure: Lower Extremity Angiography;  Surgeon: Annice NeedyJason S Aishwarya Shiplett, MD;  Location: [facility] INVASIVE CV LAB;  Service: Cardiovascular;  Laterality: Right;  . PERIPHERAL VASCULAR CATHETERIZATION  05/27/2015   Procedure: Lower Extremity Intervention;  Surgeon: Annice NeedyJason S Chivonne Rascon, MD;   Location: [facility] INVASIVE CV LAB;  Service: Cardiovascular;;  . PERIPHERAL VASCULAR CATHETERIZATION Right 09/22/2016   Procedure: Lower Extremity Angiography;  Surgeon: Annice NeedyJason S Hayla Hinger, MD;  Location: [facility] INVASIVE CV LAB;  Service: Cardiovascular;  Laterality: Right;  . TUBAL LIGATION      Social History Social History  Substance Use Topics  . Smoking status: Former Smoker    Packs/day: 0.25    Years: 38.00    Types: Cigarettes    Quit date: 09/12/2016  . Smokeless tobacco: Current User  . Alcohol use No    Family History No history of bleeding disorders, clotting disorders, or aneurysms  Allergies  Allergen Reactions  . Niacin And Related Nausea And Vomiting     REVIEW OF SYSTEMS (Negative unless checked)  Constitutional: [] Weight loss  [] Fever  [] Chills Cardiac: [] Chest pain   [] Chest pressure   [] Palpitations   [] Shortness of breath when laying flat   [] Shortness of breath at rest   [x] Shortness of breath with exertion. Vascular:  [] Pain in legs with walking   [] Pain in legs at rest   [] Pain in legs when laying flat   [] Claudication   [] Pain in feet when walking  [] Pain in feet at rest  [] Pain in feet when laying flat   [] History of DVT   [] Phlebitis   [x] Swelling in legs   [] Varicose veins   [x] Non-healing ulcers Pulmonary:   [] Uses home oxygen   [] Productive cough   [] Hemoptysis   [] Wheeze  [x] COPD   [] Asthma Neurologic:  [] Dizziness  [] Blackouts   [] Seizures   [] History of stroke   [] History of TIA  [] Aphasia   [] Temporary blindness   [] Dysphagia   [] Weakness or numbness in arms   [] Weakness or numbness in legs Musculoskeletal:  [x] Arthritis   [] Joint swelling   [x] Joint pain   [] Low back pain Hematologic:  [] Easy bruising  [] Easy bleeding   [] Hypercoagulable state   [] Anemic   Gastrointestinal:  [] Blood in stool   [] Vomiting blood  [] Gastroesophageal reflux/heartburn   [] Abdominal pain Genitourinary:  [] Chronic kidney disease   [] Difficult urination  [] Frequent urination   [] Burning with urination   [] Hematuria Skin:  [] Rashes   [x] Ulcers   [x] Wounds Psychological:  [] History of anxiety   []  History of major depression.  Physical Examination  BP 125/74 (BP Location: Left Arm)   Pulse 99   Resp 16   Ht 5\' 3"  (1.6 m)   Wt 260 lb (117.9 kg)   BMI 46.06 kg/m  Gen:  WD/WN, NAD. Morbidly obese Head: [place]/AT, No  temporalis wasting. Ear/Nose/Throat: Hearing grossly intact, nares w/o erythema or drainage, trachea midline Eyes: Conjunctiva clear. Sclera non-icteric Neck: Supple.  No JVD.  Pulmonary:  Good air movement, no use of accessory muscles.  Cardiac: RRR, normal S1, S2 Vascular:  Vessel Right Left  Radial Palpable Palpable  Ulnar Palpable Palpable  Brachial Palpable Palpable  Carotid Palpable, without bruit Palpable, without bruit  Aorta Not palpable N/A  Femoral Palpable Palpable  Popliteal Not Palpable Palpable  PT Not Palpable 1+ Palpable  DP Not Palpable Palpable   Gastrointestinal: soft, non-tender/non-distended.  Musculoskeletal: M/S 5/5 throughout.  Using a wheelchair. Right below-knee amputation site healing well. No edema. Neurologic: Sensation grossly intact in extremities.  Symmetrical.  Speech is fluent.  Psychiatric: Judgment intact, Mood & affect appropriate for pt's clinical situation. Dermatologic: Right below-knee amputation site is healing well      Labs No results found for this or any previous visit (from the past 2160 hour(s)).  Radiology No results found.   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 Checked last year and doing well. To be checked again later this year.  Diabetes (HCC) blood glucose control important in reducing the progression of atherosclerotic disease. Also, involved in wound healing. On appropriate medications.   Essential hypertension, benign blood pressure control important in reducing the progression of atherosclerotic disease. On appropriate oral  medications.  PAD (peripheral artery disease) (HCC) Her right below-knee amputation is healing well. She lost due to uncontrollable infection of the right foot despite attempts at previous revascularization. I would plan to recheck her perfusion in about 3 months with noninvasive studies on the left leg as she has known PAD there as well.    Festus Barren, MD  02/14/2017 2:38 PM    This note was created with Dragon medical transcription system.  Any errors from dictation are purely unintentional

## 2017-05-01 ENCOUNTER — Other Ambulatory Visit (INDEPENDENT_AMBULATORY_CARE_PROVIDER_SITE_OTHER): Payer: Self-pay | Admitting: Vascular Surgery

## 2017-05-15 ENCOUNTER — Other Ambulatory Visit: Payer: Self-pay | Admitting: Primary Care

## 2017-05-15 DIAGNOSIS — Z1239 Encounter for other screening for malignant neoplasm of breast: Secondary | ICD-10-CM

## 2017-05-31 ENCOUNTER — Encounter (INDEPENDENT_AMBULATORY_CARE_PROVIDER_SITE_OTHER): Payer: Medicaid Other

## 2017-06-07 ENCOUNTER — Ambulatory Visit
Admission: RE | Admit: 2017-06-07 | Discharge: 2017-06-07 | Disposition: A | Discharge status: Home / Self Care | Payer: Medicaid Other | Source: Ambulatory Visit | Attending: Primary Care | Admitting: Primary Care

## 2017-06-07 DIAGNOSIS — Z1239 Encounter for other screening for malignant neoplasm of breast: Secondary | ICD-10-CM

## 2017-06-07 DIAGNOSIS — Z1231 Encounter for screening mammogram for malignant neoplasm of breast: Secondary | ICD-10-CM | POA: Diagnosis present

## 2017-06-14 ENCOUNTER — Encounter (INDEPENDENT_AMBULATORY_CARE_PROVIDER_SITE_OTHER): Payer: Medicaid Other

## 2017-09-12 DIAGNOSIS — Z0289 Encounter for other administrative examinations: Secondary | ICD-10-CM

## 2020-08-14 DIAGNOSIS — E119 Type 2 diabetes mellitus without complications: Secondary | ICD-10-CM | POA: Insufficient documentation

## 2020-08-14 DIAGNOSIS — I2510 Atherosclerotic heart disease of native coronary artery without angina pectoris: Secondary | ICD-10-CM | POA: Insufficient documentation

## 2020-08-14 DIAGNOSIS — Z87891 Personal history of nicotine dependence: Secondary | ICD-10-CM | POA: Diagnosis not present

## 2020-08-14 DIAGNOSIS — J449 Chronic obstructive pulmonary disease, unspecified: Secondary | ICD-10-CM | POA: Insufficient documentation

## 2020-08-14 DIAGNOSIS — Z7901 Long term (current) use of anticoagulants: Secondary | ICD-10-CM | POA: Diagnosis not present

## 2020-08-14 DIAGNOSIS — I509 Heart failure, unspecified: Secondary | ICD-10-CM | POA: Insufficient documentation

## 2020-08-14 DIAGNOSIS — I110 Hypertensive heart disease with heart failure: Secondary | ICD-10-CM | POA: Insufficient documentation

## 2020-08-14 DIAGNOSIS — Z79899 Other long term (current) drug therapy: Secondary | ICD-10-CM | POA: Insufficient documentation

## 2020-08-14 DIAGNOSIS — Z7982 Long term (current) use of aspirin: Secondary | ICD-10-CM | POA: Insufficient documentation

## 2020-08-14 DIAGNOSIS — Z7984 Long term (current) use of oral hypoglycemic drugs: Secondary | ICD-10-CM | POA: Diagnosis not present

## 2020-08-14 DIAGNOSIS — R251 Tremor, unspecified: Secondary | ICD-10-CM | POA: Diagnosis present

## 2020-08-14 DIAGNOSIS — G249 Dystonia, unspecified: Secondary | ICD-10-CM | POA: Diagnosis not present

## 2020-08-14 DIAGNOSIS — Z794 Long term (current) use of insulin: Secondary | ICD-10-CM | POA: Insufficient documentation

## 2020-08-14 DIAGNOSIS — Z955 Presence of coronary angioplasty implant and graft: Secondary | ICD-10-CM | POA: Diagnosis not present

## 2020-08-15 ENCOUNTER — Emergency Department
Admission: EM | Admit: 2020-08-15 | Discharge: 2020-08-15 | Disposition: A | Discharge status: Home / Self Care | Payer: Medicaid Other | Attending: Emergency Medicine | Admitting: Emergency Medicine

## 2020-08-15 ENCOUNTER — Other Ambulatory Visit: Payer: Self-pay

## 2020-08-15 DIAGNOSIS — G249 Dystonia, unspecified: Secondary | ICD-10-CM

## 2020-08-15 LAB — BASIC METABOLIC PANEL
Anion gap: 10 (ref 5–15)
BUN: 16 mg/dL (ref 6–20)
CO2: 25 mmol/L (ref 22–32)
Calcium: 9 mg/dL (ref 8.9–10.3)
Chloride: 102 mmol/L (ref 98–111)
Creatinine, Ser: 1.01 mg/dL — ABNORMAL HIGH (ref 0.44–1.00)
GFR, Estimated: >60 mL/min (ref >60)
Glucose, Bld: 74 mg/dL (ref 70–99)
Potassium: 3.8 mmol/L (ref 3.5–5.1)
Sodium: 137 mmol/L (ref 135–145)

## 2020-08-15 LAB — CBC
HCT: 38 % (ref 36.0–46.0)
Hemoglobin: 12.1 g/dL (ref 12.0–15.0)
MCH: 26.8 pg (ref 26.0–34.0)
MCHC: 31.8 g/dL (ref 30.0–36.0)
MCV: 84.1 fL (ref 80.0–100.0)
Platelets: 235 10*3/uL (ref 150–400)
RBC: 4.52 MIL/uL (ref 3.87–5.11)
RDW: 14.3 % (ref 11.5–15.5)
WBC: 14.9 10*3/uL — ABNORMAL HIGH (ref 4.0–10.5)
nRBC: 0 % (ref 0.0–0.2)

## 2020-08-15 MED ORDER — BENZTROPINE MESYLATE 1 MG PO TABS
2.0000 mg | ORAL_TABLET | ORAL | Status: AC
  Administered 2020-08-15: 2 mg via ORAL
  Filled 2020-08-15: qty 2

## 2020-08-15 NOTE — ED Notes (Signed)
Pt states she is feeling better .

## 2020-08-15 NOTE — ED Notes (Signed)
Patient requesting to sit in wheelchair rather than transfer to stretcher until we determine plan for discharge or treatment.

## 2020-08-15 NOTE — Discharge Instructions (Addendum)
Your medical work-up was generally reassuring, and your symptoms are a bit inconsistent because you have some degree of voluntary control over the movements.  However the movements you are exhibiting are most consistent with a condition called dystonia, or a dystonic reaction.  They improved with a dose of medicine called Cogentin.  I recommend that you follow-up with your regular doctor at the next available opportunity and also consider taking over-the-counter Benadryl according to the label instructions if you continue to have symptoms, because Benadryl acts similarly to Cogentin.  You may also want to consider decreasing your dose of gabapentin, because this medication can cause unusual neurological side effects at times.    We also provided follow-up information with a neurologist named Dr. Clelia Croft; please call his office first thing in the morning on Monday and explain you need a follow-up appointment from an emergency department visit.  It often takes quite a while to get a follow-up appointment so please follow-up with your regular provider as well.    Return to the emergency department if you develop new or worsening symptoms that concern you.

## 2020-08-15 NOTE — ED Triage Notes (Signed)
First Nurse: Pt BIB EMS and stated that pt is coming in with tremors to the arms. Pt has history of restless leg syndrome. EMS states that when pt sits and focuses on something else, the tremors stop.  Tremors started over 24 hours ago, but got bad this morning.  Vitals for EMS BGL: 95 BP: 124/70

## 2020-08-15 NOTE — ED Provider Notes (Signed)
[facility] Emergency Department Provider Note  ____________________________________________   First MD Initiated Contact with Patient 08/15/20 [phone]     (approximate)  I have reviewed the triage vital signs and the nursing notes.   HISTORY  Chief Complaint Tremors    HPI [name] is a 59 y.o. female with extensive medical history as listed below who presents for evaluation of excessive movement of her arms and legs, most notable on the left side of her body.  This apparently has started over the course of the last 24 hours.  She said that she has not been taking any new medications including no psychiatric medicines other than her Wellbutrin.  She does not use drugs except for smoking marijuana for the last 2 days.  No alcohol.  No weakness in her extremities, just what she describes as "restless legs but also of my arm".  She says she has restless leg syndrome at baseline.  She reports that her daughter "did not like the way I was acting" and called 911.  She denies fever, neck pain, sore throat, chest pain, shortness of breath, abdominal pain, and dysuria.  She said that she had some nausea and vomiting 2 days ago but not since then.  She has been eating and drinking normally.   She said that although she is having these abnormal movements particularly in her arm and her leg, if she stops and thinks about it she can make the movements stop, but then they start back up again when she stops concentrating.        Past Medical History:  Diagnosis Date  . Anemia   . Anxiety   . Arthritis   . CHF (congestive heart failure) (HCC)   . COPD (chronic obstructive pulmonary disease) (HCC)   . Coronary artery disease   . Diabetes mellitus without complication (HCC)   . Fibromyalgia   . GERD (gastroesophageal reflux disease)   . Hypertension   . Peripheral vascular disease (HCC)   . Sleep apnea   . Stroke [facility])     Patient Active Problem List   Diagnosis  Date Noted  . Open toe wound 12/20/2016  . Lower limb ulcer, calf, right, limited to breakdown of skin (HCC) 11/22/2016  . PAD (peripheral artery disease) (HCC) 10/20/2016  . Diabetes (HCC) 09/13/2016  . Essential hypertension, benign 09/13/2016  . Morbid obesity (HCC) 09/13/2016  . Atherosclerosis of native arteries of the extremities with ulceration (HCC) 09/13/2016  . Tobacco use disorder 09/13/2016  . Carotid artery obstruction 01/29/2015    Past Surgical History:  Procedure Laterality Date  . CESAREAN SECTION    . CORONARY ANGIOPLASTY WITH STENT PLACEMENT Left   . DILATION AND CURETTAGE OF UTERUS    . ENDARTERECTOMY Left 01/29/2015   Procedure: ENDARTERECTOMY CAROTID;  Surgeon: Annice Needy, MD;  Location: [facility] ORS;  Service: Vascular;  Laterality: Left;  . PERIPHERAL VASCULAR CATHETERIZATION Right 05/27/2015   Procedure: Lower Extremity Angiography;  Surgeon: Annice Needy, MD;  Location: [facility] INVASIVE CV LAB;  Service: Cardiovascular;  Laterality: Right;  . PERIPHERAL VASCULAR CATHETERIZATION  05/27/2015   Procedure: Lower Extremity Intervention;  Surgeon: Annice Needy, MD;  Location: [facility] INVASIVE CV LAB;  Service: Cardiovascular;;  . PERIPHERAL VASCULAR CATHETERIZATION Right 09/22/2016   Procedure: Lower Extremity Angiography;  Surgeon: Annice Needy, MD;  Location: [facility] INVASIVE CV LAB;  Service: Cardiovascular;  Laterality: Right;  . TUBAL LIGATION      Prior to Admission medications  Medication Sig Start Date End Date Taking? Authorizing Provider  ACCU-CHEK AVIVA PLUS test strip See admin instructions. 01/01/17   [provider]  aspirin EC 81 MG tablet Take 81 mg by mouth daily.    [provider]  buPROPion (WELLBUTRIN SR) 150 MG 12 hr tablet TAKE 1 TABLET BY MOUTH 3 TIMES A DAY FOR 3 DAYS THEN 1 TABLET TWICE A DAY AFTER 08/03/16   [provider]  BYDUREON 2 MG PEN INJECT SUBCUTANEOUSLY ONCE WEEKLY FOR HIGH BLOOD SUGAR 09/17/16   [provider]   carvedilol (COREG) 3.125 MG tablet Take 3.125 mg by mouth 2 (two) times daily with a meal.     [provider]  clopidogrel (PLAVIX) 75 MG tablet Take 75 mg by mouth daily.    [provider]  enalapril (VASOTEC) 10 MG tablet 1 BY MOUTH DAILY FOR HIGH BLOOD PRESSURE 09/28/15   [provider]  fexofenadine (ALLEGRA) 180 MG tablet Take by mouth.    [provider]  furosemide (LASIX) 40 MG tablet Take 40 mg by mouth daily.    [provider]  gabapentin (NEURONTIN) 300 MG capsule Take 300 mg by mouth daily as needed (for nerve pain).     [provider]  glipiZIDE (GLUCOTROL) 10 MG tablet 1 BY MOUTH TWICE A DAY FOR DIABETES 09/17/16   [provider]  HUMULIN R U-500 KWIKPEN 500 UNIT/ML kwikpen INJECT 100 UNITS BEFORE BREAKFAST, 50 UNITS BEFORE SUPPER. 11/09/16   [provider]  ibuprofen (ADVIL,MOTRIN) 200 MG tablet Take 200 mg by mouth every 6 (six) hours as needed.    [provider]  JANUVIA 100 MG tablet 1 BY MOUTH DAILY FOR DIABETES 09/17/16   [provider]  metFORMIN (GLUCOPHAGE) 1000 MG tablet Take 1,000 mg by mouth 2 (two) times daily with a meal.    [provider]  methocarbamol (ROBAXIN) 500 MG tablet 1-2 BY MOUTH EVERY 6 HOURS AS NEEDED FOR MUSCLE SPASM 12/14/16   [provider]  naproxen (NAPROSYN) 500 MG tablet 1 BY MOUTH TWICE DAILY FOR 10 DAYS THEN AS NEEDED FOR PAIN 12/14/16   [provider]  oxyCODONE-acetaminophen (PERCOCET/ROXICET) 5-325 MG tablet Take 1 tablet by mouth every 6 (six) hours as needed. for pain 11/15/16   [provider]  pantoprazole (PROTONIX) 40 MG tablet Take 40 mg by mouth daily.    [provider]  SANTYL ointment OINTMENT TOPICAL AS DIRECTED 10/31/16   [provider]  sertraline (ZOLOFT) 100 MG tablet Take 150 mg by mouth daily.     [provider]  simvastatin (ZOCOR) 40 MG tablet Take 40 mg by mouth daily.      [provider]  traMADol (ULTRAM) 50 MG tablet Take 1 tablet (50 mg total) by mouth every 6 (six) hours as needed. 08/02/16   Cuthriell, Delorise Royals, PA-C  traZODone (DESYREL) 50 MG tablet Take 50 mg by mouth at bedtime.    [provider]    Allergies Niacin and related  No family history on file.  Social History Social History   Tobacco Use  . Smoking status: Former Smoker    Packs/day: 0.25    Years: 38.00    Pack years: 9.50    Types: Cigarettes    Quit date: 09/12/2016    Years since quitting: 3.9  . Smokeless tobacco: Current User  Substance Use Topics  . Alcohol use: No  . Drug use: No    Review of Systems  Constitutional: No fever/chills Eyes: No visual changes. ENT: No sore throat. Cardiovascular: Denies chest pain. Respiratory: Denies shortness of breath. Gastrointestinal: No abdominal pain.  Nausea and vomiting 2 days ago, not since then. Genitourinary: Negative for dysuria. Musculoskeletal: Negative for neck pain.  Negative for back pain. Integumentary: Negative for rash. Neurological: Random excessive movement particularly of her left arm and left leg.  Negative for headaches, focal weakness or numbness.   ____________________________________________   PHYSICAL EXAM:  VITAL SIGNS: ED Triage Vitals  Enc Vitals Group     BP 08/15/20 0008 (!) 143/84     Pulse Rate 08/15/20 0008 83     Resp 08/15/20 0008 18     Temp 08/15/20 0008 98.3 F (36.8 C)     Temp Source 08/15/20 0008 Oral     SpO2 08/15/20 0008 94 %     Weight --      Height --      Head Circumference --      Peak Flow --      Pain Score 08/15/20 0009 0     Pain Loc --      Pain Edu? --      Excl. in GC? --     Constitutional: Alert and oriented.  Eyes: Conjunctivae are normal.  Head: Atraumatic. Nose: No congestion/rhinnorhea. Mouth/Throat: Patient is wearing a mask. Neck: No stridor.  No meningeal signs.   Cardiovascular: Normal rate, regular rhythm. Good  peripheral circulation. Grossly normal heart sounds. Respiratory: Normal respiratory effort.  No retractions. Gastrointestinal: Soft and nontender. No distention.  Musculoskeletal: Status below-knee amputation on the right leg, well-appearing with no acute abnormality.  No acute abnormality identified on left leg or any other extremity. Neurologic: Essentially normal speech and language.  No word finding difficulties.  The patient is exhibiting what appears to be dystonia with repetitive and twisting movements most notable in her left arm but also involving mostly her left leg and occasionally her face.  However she is not exhibiting the primarily oral symptoms that would be most consistent with tardive dyskinesia.  When she concentrates or when she is given a task to complete, such as finger-to-nose testing (no dysmetria), the dystonia stops.  However as soon as she stops taking about it or becomes distracted again, the abnormal movements resume.  The movements are less pronounced in the right side of her body although she does occasionally fling out her right leg or suddenly move her right arm, but it is much more obvious on the left.  No obvious weakness in any extremity. Skin:  Skin is warm, dry and intact. Psychiatric: Mood and affect are somewhat odd but not concerning.  ____________________________________________   LABS (all labs ordered are listed, but only abnormal results are displayed)  Labs Reviewed  BASIC METABOLIC PANEL - Abnormal; Notable for the following components:      Result Value   Creatinine, Ser 1.01 (*)    All other components within normal limits  CBC - Abnormal; Notable for the following components:   WBC 14.9 (*)    All other components within normal limits   ____________________________________________  EKG  No indication for emergent EKG ____________________________________________  RADIOLOGY I, Loleta Rose, personally viewed and evaluated these images  (plain radiographs) as part of my medical decision making, as well as reviewing the written report by the radiologist.  ED MD interpretation:  No indication for emergent imaging  Official radiology report(s): No results found.  ____________________________________________   PROCEDURES   Procedure(s) performed (  including Critical Care):  Procedures   ____________________________________________   INITIAL IMPRESSION / MDM / ASSESSMENT AND PLAN / ED COURSE  As part of my medical decision making, I reviewed the following data within the electronic MEDICAL RECORD NUMBER Nursing notes reviewed and incorporated, Labs reviewed , Old chart reviewed, Notes from prior ED visits and [place] Controlled Substance Database   Differential diagnosis includes, but is not limited to, nonspecific dystonia, medication or drug side effect, tardive dyskinesia, CVA, brain tumor.  The patient's metabolic panel and CBC are generally reassuring although she has a mild leukocytosis of unclear etiology.  Vital signs are stable and within normal limits.  Her blood pressure appears to be a little bit low but it is because it must be taken on her forearm due to her body habitus and it is difficult for her to remain still and I believe that is complicating the measurement.  She is generally well-appearing and it was her her daughter that insisted she come to the emergency department.  Her exam is inconsistent.  I do not understand why she is having what appears to be a dystonic reaction but yet has voluntary control over the movements.  It is extremely unlikely that a brain lesion or focal infarction, of the basal ganglia for example, would lead to such isolated symptoms with no other obvious deficits over which she also has voluntary control.  I think that imaging would be of minimal utility given that she cannot remain still long enough even for a CT scan, much less an MRI.  I am giving her a dose of Cogentin 2 mg by mouth  and will reassess to see if this has improved her symptoms.     Clinical Course as of Aug 15 656  Sat Aug 15, 2020  [id number] Patient feels better after the Cogentin and her movements have diminished.  She still has voluntary control over them.  She has been calling out continuously to the staff and wants to go home.  I am providing her with follow-up information for her primary care as well as Dr. Sherryll BurgerShah with neurology and I recommended she try taking Benadryl as needed to see if it will work in a manner similar to Cogentin.  I gave my usual and customary return precautions.   [CF]    Clinical Course User Index [CF] Loleta RoseForbach, Marnae Madani, MD     ____________________________________________  FINAL CLINICAL IMPRESSION(S) / ED DIAGNOSES  Final diagnoses:  Dystonia     MEDICATIONS GIVEN DURING THIS VISIT:  Medications  benztropine (COGENTIN) tablet 2 mg (2 mg Oral Given 08/15/20 0511)     ED Discharge Orders    None      *Please note:  Treasa SchoolVerna C Mogan was evaluated in Emergency Department on 08/15/2020 for the symptoms described in the history of present illness. She was evaluated in the context of the global COVID-19 pandemic, which necessitated consideration that the patient might be at risk for infection with the SARS-CoV-2 virus that causes COVID-19. Institutional protocols and algorithms that pertain to the evaluation of patients at risk for COVID-19 are in a state of rapid change based on information released by regulatory bodies including the CDC and federal and state organizations. These policies and algorithms were followed during the patient's care in the ED.  Some ED evaluations and interventions may be delayed as a result of limited staffing during and after the pandemic.*  Note:  This document was prepared using Dragon voice recognition software and may include  unintentional dictation errors.   Loleta Rose, MD 08/15/20 [phone]

## 2020-09-23 ENCOUNTER — Inpatient Hospital Stay: Payer: Medicaid Other

## 2020-09-23 ENCOUNTER — Other Ambulatory Visit: Payer: Self-pay

## 2020-09-23 ENCOUNTER — Emergency Department: Payer: Medicaid Other

## 2020-09-23 ENCOUNTER — Inpatient Hospital Stay
Admission: EM | Admit: 2020-09-23 | Discharge: 2020-09-25 | DRG: 854 | Disposition: A | Discharge status: Home / Self Care | Payer: Medicaid Other | Attending: Internal Medicine | Admitting: Internal Medicine

## 2020-09-23 ENCOUNTER — Encounter: Payer: Self-pay | Admitting: *Deleted

## 2020-09-23 DIAGNOSIS — E1142 Type 2 diabetes mellitus with diabetic polyneuropathy: Secondary | ICD-10-CM | POA: Diagnosis present

## 2020-09-23 DIAGNOSIS — Z8673 Personal history of transient ischemic attack (TIA), and cerebral infarction without residual deficits: Secondary | ICD-10-CM

## 2020-09-23 DIAGNOSIS — L97528 Non-pressure chronic ulcer of other part of left foot with other specified severity: Secondary | ICD-10-CM | POA: Diagnosis present

## 2020-09-23 DIAGNOSIS — A419 Sepsis, unspecified organism: Secondary | ICD-10-CM

## 2020-09-23 DIAGNOSIS — Z7982 Long term (current) use of aspirin: Secondary | ICD-10-CM

## 2020-09-23 DIAGNOSIS — R6520 Severe sepsis without septic shock: Secondary | ICD-10-CM | POA: Diagnosis not present

## 2020-09-23 DIAGNOSIS — Z820 Family history of epilepsy and other diseases of the nervous system: Secondary | ICD-10-CM | POA: Diagnosis not present

## 2020-09-23 DIAGNOSIS — I70202 Unspecified atherosclerosis of native arteries of extremities, left leg: Secondary | ICD-10-CM | POA: Diagnosis present

## 2020-09-23 DIAGNOSIS — Z833 Family history of diabetes mellitus: Secondary | ICD-10-CM

## 2020-09-23 DIAGNOSIS — F17290 Nicotine dependence, other tobacco product, uncomplicated: Secondary | ICD-10-CM | POA: Diagnosis present

## 2020-09-23 DIAGNOSIS — I10 Essential (primary) hypertension: Secondary | ICD-10-CM | POA: Diagnosis present

## 2020-09-23 DIAGNOSIS — I6523 Occlusion and stenosis of bilateral carotid arteries: Secondary | ICD-10-CM | POA: Diagnosis present

## 2020-09-23 DIAGNOSIS — L0390 Cellulitis, unspecified: Secondary | ICD-10-CM | POA: Diagnosis present

## 2020-09-23 DIAGNOSIS — F419 Anxiety disorder, unspecified: Secondary | ICD-10-CM | POA: Diagnosis present

## 2020-09-23 DIAGNOSIS — Z823 Family history of stroke: Secondary | ICD-10-CM

## 2020-09-23 DIAGNOSIS — I2510 Atherosclerotic heart disease of native coronary artery without angina pectoris: Secondary | ICD-10-CM | POA: Diagnosis present

## 2020-09-23 DIAGNOSIS — L03116 Cellulitis of left lower limb: Secondary | ICD-10-CM | POA: Diagnosis present

## 2020-09-23 DIAGNOSIS — Z6841 Body Mass Index (BMI) 40.0 and over, adult: Secondary | ICD-10-CM

## 2020-09-23 DIAGNOSIS — E1169 Type 2 diabetes mellitus with other specified complication: Secondary | ICD-10-CM | POA: Diagnosis not present

## 2020-09-23 DIAGNOSIS — Z794 Long term (current) use of insulin: Secondary | ICD-10-CM

## 2020-09-23 DIAGNOSIS — E11628 Type 2 diabetes mellitus with other skin complications: Secondary | ICD-10-CM | POA: Diagnosis not present

## 2020-09-23 DIAGNOSIS — G255 Other chorea: Secondary | ICD-10-CM | POA: Diagnosis present

## 2020-09-23 DIAGNOSIS — I82409 Acute embolism and thrombosis of unspecified deep veins of unspecified lower extremity: Secondary | ICD-10-CM | POA: Diagnosis not present

## 2020-09-23 DIAGNOSIS — Z825 Family history of asthma and other chronic lower respiratory diseases: Secondary | ICD-10-CM | POA: Diagnosis not present

## 2020-09-23 DIAGNOSIS — M797 Fibromyalgia: Secondary | ICD-10-CM | POA: Diagnosis present

## 2020-09-23 DIAGNOSIS — L089 Local infection of the skin and subcutaneous tissue, unspecified: Secondary | ICD-10-CM

## 2020-09-23 DIAGNOSIS — Z7902 Long term (current) use of antithrombotics/antiplatelets: Secondary | ICD-10-CM

## 2020-09-23 DIAGNOSIS — Z20822 Contact with and (suspected) exposure to covid-19: Secondary | ICD-10-CM | POA: Diagnosis present

## 2020-09-23 DIAGNOSIS — Z8249 Family history of ischemic heart disease and other diseases of the circulatory system: Secondary | ICD-10-CM

## 2020-09-23 DIAGNOSIS — A408 Other streptococcal sepsis: Principal | ICD-10-CM | POA: Diagnosis present

## 2020-09-23 DIAGNOSIS — E1165 Type 2 diabetes mellitus with hyperglycemia: Secondary | ICD-10-CM | POA: Diagnosis present

## 2020-09-23 DIAGNOSIS — Z89511 Acquired absence of right leg below knee: Secondary | ICD-10-CM

## 2020-09-23 DIAGNOSIS — G4733 Obstructive sleep apnea (adult) (pediatric): Secondary | ICD-10-CM | POA: Diagnosis present

## 2020-09-23 DIAGNOSIS — D649 Anemia, unspecified: Secondary | ICD-10-CM | POA: Diagnosis present

## 2020-09-23 DIAGNOSIS — I739 Peripheral vascular disease, unspecified: Secondary | ICD-10-CM | POA: Diagnosis not present

## 2020-09-23 DIAGNOSIS — F329 Major depressive disorder, single episode, unspecified: Secondary | ICD-10-CM | POA: Diagnosis present

## 2020-09-23 DIAGNOSIS — E1151 Type 2 diabetes mellitus with diabetic peripheral angiopathy without gangrene: Secondary | ICD-10-CM | POA: Diagnosis present

## 2020-09-23 DIAGNOSIS — Z955 Presence of coronary angioplasty implant and graft: Secondary | ICD-10-CM

## 2020-09-23 DIAGNOSIS — K219 Gastro-esophageal reflux disease without esophagitis: Secondary | ICD-10-CM | POA: Diagnosis present

## 2020-09-23 DIAGNOSIS — E11621 Type 2 diabetes mellitus with foot ulcer: Secondary | ICD-10-CM | POA: Diagnosis present

## 2020-09-23 DIAGNOSIS — N179 Acute kidney failure, unspecified: Secondary | ICD-10-CM | POA: Diagnosis present

## 2020-09-23 DIAGNOSIS — I70245 Atherosclerosis of native arteries of left leg with ulceration of other part of foot: Secondary | ICD-10-CM | POA: Diagnosis not present

## 2020-09-23 DIAGNOSIS — B955 Unspecified streptococcus as the cause of diseases classified elsewhere: Secondary | ICD-10-CM | POA: Diagnosis not present

## 2020-09-23 DIAGNOSIS — E119 Type 2 diabetes mellitus without complications: Secondary | ICD-10-CM

## 2020-09-23 DIAGNOSIS — M1990 Unspecified osteoarthritis, unspecified site: Secondary | ICD-10-CM | POA: Diagnosis present

## 2020-09-23 DIAGNOSIS — Z7984 Long term (current) use of oral hypoglycemic drugs: Secondary | ICD-10-CM

## 2020-09-23 DIAGNOSIS — Z8261 Family history of arthritis: Secondary | ICD-10-CM

## 2020-09-23 DIAGNOSIS — R7881 Bacteremia: Secondary | ICD-10-CM | POA: Diagnosis not present

## 2020-09-23 DIAGNOSIS — Z79899 Other long term (current) drug therapy: Secondary | ICD-10-CM

## 2020-09-23 LAB — POC SARS CORONAVIRUS 2 AG -  ED: SARS Coronavirus 2 Ag: NEGATIVE

## 2020-09-23 LAB — BLOOD CULTURE ID PANEL (REFLEXED) - BCID2

## 2020-09-23 LAB — URINALYSIS, COMPLETE (UACMP) WITH MICROSCOPIC
Bacteria, UA: NONE SEEN
Bilirubin Urine: NEGATIVE
Glucose, UA: >=500 mg/dL — AB
Ketones, ur: 5 mg/dL — AB
Leukocytes,Ua: NEGATIVE
Nitrite: POSITIVE — AB
Protein, ur: >=300 mg/dL — AB
Specific Gravity, Urine: 1.039 — ABNORMAL HIGH (ref 1.005–1.030)
Squamous Epithelial / HPF: NONE SEEN (ref 0–5)
pH: 5 (ref 5.0–8.0)

## 2020-09-23 LAB — LACTIC ACID, PLASMA
Lactic Acid, Venous: 1.8 mmol/L (ref 0.5–1.9)
Lactic Acid, Venous: 1.6 mmol/L (ref 0.5–1.9)

## 2020-09-23 LAB — CBG MONITORING, ED
Glucose-Capillary: 311 mg/dL — ABNORMAL HIGH (ref 70–99)
Glucose-Capillary: 327 mg/dL — ABNORMAL HIGH (ref 70–99)

## 2020-09-23 LAB — TROPONIN I (HIGH SENSITIVITY)
Troponin I (High Sensitivity): 94 ng/L — ABNORMAL HIGH (ref <18)
Troponin I (High Sensitivity): 133 ng/L (ref <18)

## 2020-09-23 LAB — CBC WITH DIFFERENTIAL/PLATELET
Abs Immature Granulocytes: 0.08 10*3/uL — ABNORMAL HIGH (ref 0.00–0.07)
Basophils Absolute: 0.1 10*3/uL (ref 0.0–0.1)
Basophils Relative: 0 %
Eosinophils Absolute: 0.1 10*3/uL (ref 0.0–0.5)
Eosinophils Relative: 0 %
HCT: 35.6 % — ABNORMAL LOW (ref 36.0–46.0)
Hemoglobin: 11.3 g/dL — ABNORMAL LOW (ref 12.0–15.0)
Immature Granulocytes: 1 %
Lymphocytes Relative: 2 %
Lymphs Abs: 0.4 10*3/uL — ABNORMAL LOW (ref 0.7–4.0)
MCH: 26.5 pg (ref 26.0–34.0)
MCHC: 31.7 g/dL (ref 30.0–36.0)
MCV: 83.4 fL (ref 80.0–100.0)
Monocytes Absolute: 0.9 10*3/uL (ref 0.1–1.0)
Monocytes Relative: 5 %
Neutro Abs: 15.6 10*3/uL — ABNORMAL HIGH (ref 1.7–7.7)
Neutrophils Relative %: 92 %
Platelets: 256 10*3/uL (ref 150–400)
RBC: 4.27 MIL/uL (ref 3.87–5.11)
RDW: 15 % (ref 11.5–15.5)
WBC: 17.1 10*3/uL — ABNORMAL HIGH (ref 4.0–10.5)
nRBC: 0 % (ref 0.0–0.2)

## 2020-09-23 LAB — COMPREHENSIVE METABOLIC PANEL
ALT: 19 U/L (ref 0–44)
AST: 15 U/L (ref 15–41)
Albumin: 3.1 g/dL — ABNORMAL LOW (ref 3.5–5.0)
Alkaline Phosphatase: 103 U/L (ref 38–126)
Anion gap: 10 (ref 5–15)
BUN: 16 mg/dL (ref 6–20)
CO2: 24 mmol/L (ref 22–32)
Calcium: 8.9 mg/dL (ref 8.9–10.3)
Chloride: 99 mmol/L (ref 98–111)
Creatinine, Ser: 0.88 mg/dL (ref 0.44–1.00)
GFR, Estimated: >60 mL/min (ref >60)
Glucose, Bld: 350 mg/dL — ABNORMAL HIGH (ref 70–99)
Potassium: 4 mmol/L (ref 3.5–5.1)
Sodium: 133 mmol/L — ABNORMAL LOW (ref 135–145)
Total Bilirubin: 0.9 mg/dL (ref 0.3–1.2)
Total Protein: 7 g/dL (ref 6.5–8.1)

## 2020-09-23 LAB — RESP PANEL BY RT-PCR (FLU A&B, COVID) ARPGX2
Influenza A by PCR: NEGATIVE
Influenza B by PCR: NEGATIVE
SARS Coronavirus 2 by RT PCR: NEGATIVE

## 2020-09-23 LAB — PROTIME-INR
INR: 1.1 (ref 0.8–1.2)
Prothrombin Time: 13.9 seconds (ref 11.4–15.2)

## 2020-09-23 LAB — CK: Total CK: 90 U/L (ref 38–234)

## 2020-09-23 LAB — TSH: TSH: 1.335 u[IU]/mL (ref 0.350–4.500)

## 2020-09-23 LAB — C-REACTIVE PROTEIN: CRP: 3.7 mg/dL — ABNORMAL HIGH (ref <1.0)

## 2020-09-23 LAB — HEMOGLOBIN A1C
Hgb A1c MFr Bld: 8.6 % — ABNORMAL HIGH (ref 4.8–5.6)
Mean Plasma Glucose: 200.12 mg/dL

## 2020-09-23 LAB — FIBRIN DERIVATIVES D-DIMER (ARMC ONLY): Fibrin derivatives D-dimer (ARMC): 1094.66 ng/mL (FEU) — ABNORMAL HIGH (ref 0.00–499.00)

## 2020-09-23 LAB — APTT: aPTT: 30 seconds (ref 24–36)

## 2020-09-23 LAB — PROCALCITONIN: Procalcitonin: 0.21 ng/mL

## 2020-09-23 LAB — MAGNESIUM: Magnesium: 1.3 mg/dL — ABNORMAL LOW (ref 1.7–2.4)

## 2020-09-23 MED ORDER — INSULIN ASPART 100 UNIT/ML ~~LOC~~ SOLN: 15.0000 [IU] | Freq: Three times a day (TID) | SUBCUTANEOUS | Status: DC

## 2020-09-23 MED ORDER — IOHEXOL 300 MG/ML  SOLN
100.0000 mL | Freq: Once | INTRAMUSCULAR | Status: AC | PRN
  Administered 2020-09-23: 09:00:00 100 mL via INTRAVENOUS

## 2020-09-23 MED ORDER — SODIUM CHLORIDE 0.9 % IV SOLN: INTRAVENOUS | Status: AC

## 2020-09-23 MED ORDER — CEFAZOLIN SODIUM-DEXTROSE 2-4 GM/100ML-% IV SOLN
2.0000 g | INTRAVENOUS | Status: DC
  Filled 2020-09-23 (×2): qty 100

## 2020-09-23 MED ORDER — ENOXAPARIN SODIUM 60 MG/0.6ML ~~LOC~~ SOLN
0.5000 mg/kg | SUBCUTANEOUS | Status: DC
  Administered 2020-09-24: 60 mg via SUBCUTANEOUS
  Filled 2020-09-23: qty 0.6

## 2020-09-23 MED ORDER — INSULIN ASPART 100 UNIT/ML ~~LOC~~ SOLN
0.0000 [IU] | Freq: Every day | SUBCUTANEOUS | Status: DC
  Administered 2020-09-23: 4 [IU] via SUBCUTANEOUS
  Administered 2020-09-24: 22:00:00 3 [IU] via SUBCUTANEOUS
  Administered 2020-09-25: 4 [IU] via SUBCUTANEOUS
  Filled 2020-09-23 (×3): qty 1

## 2020-09-23 MED ORDER — SODIUM CHLORIDE 0.9 % IV SOLN
2.0000 g | Freq: Once | INTRAVENOUS | Status: AC
  Administered 2020-09-23: 09:00:00 2 g via INTRAVENOUS
  Filled 2020-09-23: qty 2

## 2020-09-23 MED ORDER — GADOBUTROL 1 MMOL/ML IV SOLN
10.0000 mL | Freq: Once | INTRAVENOUS | Status: DC | PRN
  Filled 2020-09-23: qty 10

## 2020-09-23 MED ORDER — ONDANSETRON HCL 4 MG/2ML IJ SOLN
4.0000 mg | Freq: Once | INTRAMUSCULAR | Status: AC
  Administered 2020-09-23: 09:00:00 4 mg via INTRAVENOUS
  Filled 2020-09-23: qty 2

## 2020-09-23 MED ORDER — SODIUM CHLORIDE 0.9 % IV SOLN
2.0000 g | Freq: Three times a day (TID) | INTRAVENOUS | Status: DC
  Administered 2020-09-23 – 2020-09-25 (×6): 2 g via INTRAVENOUS
  Filled 2020-09-23 (×9): qty 2

## 2020-09-23 MED ORDER — ALBUTEROL SULFATE (2.5 MG/3ML) 0.083% IN NEBU: 2.5000 mg | INHALATION_SOLUTION | RESPIRATORY_TRACT | Status: DC | PRN

## 2020-09-23 MED ORDER — SODIUM CHLORIDE 0.9 % IV BOLUS (SEPSIS)
1000.0000 mL | Freq: Once | INTRAVENOUS | Status: AC
  Administered 2020-09-23: 09:00:00 1000 mL via INTRAVENOUS

## 2020-09-23 MED ORDER — INSULIN ASPART 100 UNIT/ML ~~LOC~~ SOLN
0.0000 [IU] | Freq: Three times a day (TID) | SUBCUTANEOUS | Status: DC
  Administered 2020-09-23: 17:00:00 15 [IU] via SUBCUTANEOUS
  Administered 2020-09-24: 13:00:00 11 [IU] via SUBCUTANEOUS
  Administered 2020-09-24: 18:00:00 7 [IU] via SUBCUTANEOUS
  Administered 2020-09-25: 15 [IU] via SUBCUTANEOUS
  Administered 2020-09-25: 7 [IU] via SUBCUTANEOUS
  Administered 2020-09-25: 15 [IU] via SUBCUTANEOUS
  Filled 2020-09-23 (×6): qty 1

## 2020-09-23 MED ORDER — ONDANSETRON HCL 4 MG PO TABS
4.0000 mg | ORAL_TABLET | Freq: Four times a day (QID) | ORAL | Status: DC | PRN
  Administered 2020-09-25: 4 mg via ORAL
  Filled 2020-09-23: qty 1

## 2020-09-23 MED ORDER — SODIUM CHLORIDE 0.9 % IV BOLUS
1000.0000 mL | Freq: Once | INTRAVENOUS | Status: AC
  Administered 2020-09-23: 14:00:00 1000 mL via INTRAVENOUS

## 2020-09-23 MED ORDER — VANCOMYCIN HCL 750 MG/150ML IV SOLN
750.0000 mg | Freq: Two times a day (BID) | INTRAVENOUS | Status: DC
  Filled 2020-09-23 (×3): qty 150

## 2020-09-23 MED ORDER — METRONIDAZOLE IN NACL 5-0.79 MG/ML-% IV SOLN
500.0000 mg | Freq: Once | INTRAVENOUS | Status: AC
  Administered 2020-09-23: 10:00:00 500 mg via INTRAVENOUS
  Filled 2020-09-23: qty 100

## 2020-09-23 MED ORDER — INSULIN REGULAR HUMAN (CONC) 500 UNIT/ML ~~LOC~~ SOPN
70.0000 [IU] | PEN_INJECTOR | Freq: Every day | SUBCUTANEOUS | Status: DC
  Filled 2020-09-23: qty 3

## 2020-09-23 MED ORDER — VANCOMYCIN HCL 1500 MG/300ML IV SOLN
1500.0000 mg | Freq: Once | INTRAVENOUS | Status: AC
  Administered 2020-09-23: 11:00:00 1500 mg via INTRAVENOUS
  Filled 2020-09-23: qty 300

## 2020-09-23 MED ORDER — ENOXAPARIN SODIUM 40 MG/0.4ML ~~LOC~~ SOLN: 40.0000 mg | SUBCUTANEOUS | Status: DC

## 2020-09-23 MED ORDER — IBUPROFEN 400 MG PO TABS
400.0000 mg | ORAL_TABLET | Freq: Four times a day (QID) | ORAL | Status: DC | PRN
  Administered 2020-09-23: 400 mg via ORAL
  Filled 2020-09-23: qty 1

## 2020-09-23 MED ORDER — VANCOMYCIN HCL IN DEXTROSE 1-5 GM/200ML-% IV SOLN
1000.0000 mg | Freq: Once | INTRAVENOUS | Status: AC
  Administered 2020-09-23: 11:00:00 1000 mg via INTRAVENOUS
  Filled 2020-09-23: qty 200

## 2020-09-23 MED ORDER — ACETAMINOPHEN 325 MG PO TABS
650.0000 mg | ORAL_TABLET | Freq: Once | ORAL | Status: AC
  Administered 2020-09-23: 09:00:00 650 mg via ORAL
  Filled 2020-09-23: qty 2

## 2020-09-23 MED ORDER — SODIUM CHLORIDE 0.9 % IV SOLN: INTRAVENOUS | Status: DC

## 2020-09-23 MED ORDER — INSULIN REGULAR HUMAN (CONC) 500 UNIT/ML ~~LOC~~ SOPN
50.0000 [IU] | PEN_INJECTOR | Freq: Every day | SUBCUTANEOUS | Status: DC
  Filled 2020-09-23: qty 3

## 2020-09-23 MED ORDER — ONDANSETRON HCL 4 MG/2ML IJ SOLN: 4.0000 mg | Freq: Four times a day (QID) | INTRAMUSCULAR | Status: DC | PRN

## 2020-09-23 MED ORDER — HYDROCODONE-ACETAMINOPHEN 5-325 MG PO TABS
1.0000 | ORAL_TABLET | ORAL | Status: DC | PRN
  Administered 2020-09-23: 1 via ORAL
  Administered 2020-09-23: 2 via ORAL
  Administered 2020-09-24: 1 via ORAL
  Administered 2020-09-25 (×2): 2 via ORAL
  Filled 2020-09-23: qty 2
  Filled 2020-09-23: qty 1
  Filled 2020-09-23 (×2): qty 2
  Filled 2020-09-23: qty 1

## 2020-09-23 MED ORDER — COLLAGENASE 250 UNIT/GM EX OINT
TOPICAL_OINTMENT | Freq: Every day | CUTANEOUS | Status: DC
  Filled 2020-09-23 (×2): qty 30

## 2020-09-23 NOTE — Progress Notes (Signed)
PHARMACIST - PHYSICIAN COMMUNICATION  CONCERNING:  Enoxaparin (Lovenox) for DVT Prophylaxis    RECOMMENDATION: Patient was prescribed enoxaparin 40 mg q24 hours for VTE prophylaxis.   Filed Weights   09/23/20 0708  Weight: 117.9 kg (260 lb)    Body mass index is 44.63 kg/m.  Estimated Creatinine Clearance: 85.9 mL/min (by C-G formula based on SCr of 0.88 mg/dL).   Based on [facility] policy patient is candidate for enoxaparin 0.5mg /kg TBW SQ every 24 hours based on BMI > 30.    DESCRIPTION: Pharmacy has adjusted enoxaparin dose per [facility] policy.  Patient is now receiving enoxaparin 60 mg every 24 hours    Pricilla Riffle, PharmD Clinical Pharmacist  09/23/2020 4:18 PM

## 2020-09-23 NOTE — Progress Notes (Signed)
Inpatient Diabetes Program Recommendations  AACE/ADA: New Consensus Statement on Inpatient Glycemic Control   Target Ranges:  Prepandial:   less than 140 mg/dL      Peak postprandial:   less than 180 mg/dL (1-2 hours)      Critically ill patients:  140 - 180 mg/dL   Results for [name], [name] (MRN [id number]) as of 09/23/2020 11:43  Ref. Range 09/23/2020 07:07  Glucose Latest Ref Range: 70 - 99 mg/dL 701 (H)   Review of Glycemic Control  Diabetes history: DM2 Outpatient Diabetes medications: Humulin R U500 100 units with breakfast, Humulin R U500 50 units with supper, Januvia 10 mg daily, Metformin 1000 mg BID, Glipizide 10 mg BID Current orders for Inpatient glycemic control: None; in ED with N/V, fever  Inpatient Diabetes Program Recommendations:    Insulin: Please consider ordering CBGs Q4H, Novolog 0-20 units Q4H, and Lantus 20 units BID.   NOTE: In reviewing chart, noted patient was inpatient at [facility] 08/29/20-09/05/20 and was followed by Endocrinology and was given Humulin R U500 inpatient. Per discharge notes on 09/05/20 patient was asked to take Humulin R U500 70 units with breakfast, Humulin R U500 50 units with supper. Patient in ED with N/V, fever and lab glucose 350 mg/dl at 4:10 am today.   Thanks, Orlando Penner, RN, MSN, CDE Diabetes Coordinator Inpatient Diabetes Program [phone] (Team Pager from 8am to 5pm)

## 2020-09-23 NOTE — ED Notes (Signed)
Family at bedside. 

## 2020-09-23 NOTE — ED Notes (Signed)
Pt to MRI

## 2020-09-23 NOTE — ED Notes (Signed)
Pt to CT at this time.

## 2020-09-23 NOTE — Consult Note (Signed)
PODIATRY / FOOT AND ANKLE SURGERY CONSULTATION NOTE  Requesting Physician: Lauree Chandler, MD  Reason for consult: Left foot wound, cellulitis  Chief Complaint: Left foot wound   HPI: [name] is a 59 y.o. 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  Patient was recently admitted to [facility] 12/14-12/11 due to cellulitis of the left foot due to infected laceration/wound.  Patient was placed on clindamycin and then transition of vancomycin with improvement of cellulitis and transition to Doxy which she continued through 12/13.  They also provided local wound care on discharge.  Patient presented to the ED today due to history of nausea/vomiting, fever, chills for the past 12 to 24 hours.  She is also had continued pain to the left foot.  Podiatry team was consulted for evaluation of left lower extremity ulceration.  PMHx:  Past Medical History:  Diagnosis Date  . Anemia   . Anxiety   . Arthritis   . CHF (congestive heart failure) ([place])   . COPD (chronic obstructive pulmonary disease) ([place])   . Coronary artery disease   . Diabetes mellitus without complication ([place])   . Fibromyalgia   . GERD (gastroesophageal reflux disease)   . Hypertension   . Peripheral vascular disease ([place])   . Sleep apnea   . Stroke [facility])     Surgical Hx:  Past Surgical History:  Procedure Laterality Date  . CESAREAN SECTION    . CORONARY ANGIOPLASTY WITH STENT PLACEMENT Left   . DILATION AND CURETTAGE OF UTERUS    . ENDARTERECTOMY Left 01/29/2015   Procedure: ENDARTERECTOMY CAROTID;  Surgeon: Algernon Huxley, MD;  Location: [facility] ORS;  Service: Vascular;  Laterality: Left;  . PERIPHERAL VASCULAR CATHETERIZATION Right 05/27/2015   Procedure: Lower Extremity  Angiography;  Surgeon: Algernon Huxley, MD;  Location: [place] CV LAB;  Service: Cardiovascular;  Laterality: Right;  . PERIPHERAL VASCULAR CATHETERIZATION  05/27/2015   Procedure: Lower Extremity Intervention;  Surgeon: Algernon Huxley, MD;  Location: [place] CV LAB;  Service: Cardiovascular;;  . PERIPHERAL VASCULAR CATHETERIZATION Right 09/22/2016   Procedure: Lower Extremity Angiography;  Surgeon: Algernon Huxley, MD;  Location: [place] CV LAB;  Service: Cardiovascular;  Laterality: Right;  . TUBAL LIGATION      FHx: No family history on file.  Social History:  reports that she quit smoking about 4 years ago. Her smoking use included cigarettes. She has a 9.50 pack-year smoking history. She uses smokeless tobacco. She reports that she does not drink alcohol and does not use drugs.  Allergies:  Allergies  Allergen Reactions  . Niacin And Related Nausea And Vomiting    Review of Systems: General ROS: negative Respiratory ROS: no cough, shortness of breath, or wheezing Cardiovascular ROS: no chest pain or dyspnea on exertion Gastrointestinal ROS: no abdominal pain, change in bowel habits, or black or bloody stools Musculoskeletal ROS: positive for - joint pain Neurological ROS: positive for - numbness/tingling Dermatological ROS: L lateral 5th MTPJ ulceration  (Not in a hospital admission)   Physical Exam: General: Alert and oriented.  No apparent distress.  Vascular: DP/PT pulses left foot faintly palpable, foot is warm to touch, capillary fill time does appear to be intact to digits.  No hair growth noted to left lower extremity.  Neuro: Light touch sensation absent to left lower extremity.  Derm: Left lateral fifth metatarsal phalangeal joint ulceration present which measures approximately 2 x 2 cm but has minimal depth overall to 0.1 cm/dermis, no probing to bone.  Does appear to have an area underneath the plantar aspect which appears to be a fibronecrotic Or stable  eschar.  Minimal to no erythema or edema present.   MSK: Mild pain on palpation to the left lateral foot at the fifth metatarsal phalangeal joint.  Right below-knee amputation  Results for orders placed or performed during the hospital encounter of 09/23/20 (from the past 48 hour(s))  Comprehensive metabolic panel     Status: Abnormal   Collection Time: 09/23/20  7:07 AM  Result Value Ref Range   Sodium 133 (L) 135 - 145 mmol/L   Potassium 4.0 3.5 - 5.1 mmol/L   Chloride 99 98 - 111 mmol/L   CO2 24 22 - 32 mmol/L   Glucose, Bld 350 (H) 70 - 99 mg/dL    Comment: Glucose reference range applies only to samples taken after fasting for at least 8 hours.   BUN 16 6 - 20 mg/dL   Creatinine, Ser 0.88 0.44 - 1.00 mg/dL   Calcium 8.9 8.9 - 10.3 mg/dL   Total Protein 7.0 6.5 - 8.1 g/dL   Albumin 3.1 (L) 3.5 - 5.0 g/dL   AST 15 15 - 41 U/L   ALT 19 0 - 44 U/L   Alkaline Phosphatase 103 38 - 126 U/L   Total Bilirubin 0.9 0.3 - 1.2 mg/dL   GFR, Estimated >60 >60 mL/min    Comment: (NOTE) Calculated using the CKD-EPI Creatinine Equation (2021)    Anion gap 10 5 - 15    Comment: Performed at [facility], [address] [place], [place] [address]  Lactic acid, plasma     Status: None   Collection Time: 09/23/20  7:07 AM  Result Value Ref Range   Lactic Acid, Venous 1.8 0.5 - 1.9 mmol/L    Comment: Performed at [facility], [address] [place], [place] [address]  CBC with Differential     Status: Abnormal   Collection Time: 09/23/20  7:07 AM  Result Value Ref Range   WBC 17.1 (H) 4.0 - 10.5 K/uL   RBC 4.27 3.87 - 5.11 MIL/uL   Hemoglobin 11.3 (L) 12.0 - 15.0 g/dL   HCT 35.6 (L) 36.0 - 46.0 %   MCV 83.4 80.0 - 100.0 fL   MCH 26.5 26.0 - 34.0 pg   MCHC 31.7 30.0 - 36.0 g/dL   RDW 15.0 11.5 - 15.5 %   Platelets 256 150 - 400 K/uL   nRBC 0.0 0.0 - 0.2 %   Neutrophils Relative % 92 %   Neutro Abs 15.6 (H) 1.7 - 7.7 K/uL   Lymphocytes Relative 2 %   Lymphs Abs  0.4 (L) 0.7 - 4.0 K/uL   Monocytes Relative 5 %   Monocytes Absolute 0.9 0.1 - 1.0 K/uL   Eosinophils Relative 0 %   Eosinophils Absolute 0.1 0.0 - 0.5 K/uL   Basophils Relative 0 %   Basophils Absolute 0.1 0.0 - 0.1 K/uL   Immature Granulocytes 1 %   Abs Immature Granulocytes 0.08 (H) 0.00 - 0.07 K/uL    Comment: Performed at [facility], [address] [place], [place] [address]  Protime-INR     Status: None   Collection Time: 09/23/20  7:07 AM  Result Value Ref  Range   Prothrombin Time 13.9 11.4 - 15.2 seconds   INR 1.1 0.8 - 1.2    Comment: (NOTE) INR goal varies based on device and disease states. Performed at [facility], [address] [place], [place] [address]   APTT     Status: None   Collection Time: 09/23/20  7:10 AM  Result Value Ref Range   aPTT 30 24 - 36 seconds    Comment: Performed at [facility], [address] [place], [place] [address]  CK     Status: None   Collection Time: 09/23/20  7:10 AM  Result Value Ref Range   Total CK 90 38 - 234 U/L    Comment: Performed at [facility], [address] [place], [place] [address]  TSH     Status: None   Collection Time: 09/23/20  7:10 AM  Result Value Ref Range   TSH 1.335 0.350 - 4.500 uIU/mL    Comment: Performed by a 3rd Generation assay with a functional sensitivity of <=0.01 uIU/mL. Performed at [facility], [address] [place], [place] [address]   Lactic acid, plasma     Status: None   Collection Time: 09/23/20 11:27 AM  Result Value Ref Range   Lactic Acid, Venous 1.6 0.5 - 1.9 mmol/L    Comment: Performed at [facility], [address], [place], [place] [address]  Resp Panel by RT-PCR (Flu A&B, Covid) Nasopharyngeal Swab     Status: None   Collection Time: 09/23/20 11:27 AM   Specimen: Nasopharyngeal Swab; Nasopharyngeal(NP) swabs in vial transport medium  Result Value Ref Range   SARS Coronavirus 2 by RT PCR NEGATIVE NEGATIVE     Comment: (NOTE) SARS-CoV-2 target nucleic acids are NOT DETECTED.  The SARS-CoV-2 RNA is generally detectable in upper respiratory specimens during the acute phase of infection. The lowest concentration of SARS-CoV-2 viral copies this assay can detect is 138 copies/mL. A negative result does not preclude SARS-Cov-2 infection and should not be used as the sole basis for treatment or other patient management decisions. A negative result may occur with  improper specimen collection/handling, submission of specimen other than nasopharyngeal swab, presence of viral mutation(s) within the areas targeted by this assay, and inadequate number of viral copies(<138 copies/mL). A negative result must be combined with clinical observations, patient history, and epidemiological information. The expected result is Negative.  Fact Sheet for Patients:  EntrepreneurPulse.com.au  Fact Sheet for Healthcare Providers:  IncredibleEmployment.be  This test is no t yet approved or cleared by the [place] FDA and  has been authorized for detection and/or diagnosis of SARS-CoV-2 by FDA under an Emergency Use Authorization (EUA). This EUA will remain  in effect (meaning this test can be used) for the duration of the COVID-19 declaration under Section 564(b)(1) of the Act, 21 U.S.C.section 360bbb-3(b)(1), unless the authorization is terminated  or revoked sooner.       Influenza A by PCR NEGATIVE NEGATIVE   Influenza B by PCR NEGATIVE NEGATIVE    Comment: (NOTE) The Xpert Xpress SARS-CoV-2/FLU/RSV plus assay is intended as an aid in the diagnosis of influenza from Nasopharyngeal swab specimens and should not be used as a sole basis for treatment. Nasal washings and aspirates are unacceptable for Xpert Xpress SARS-CoV-2/FLU/RSV testing.  Fact Sheet for Patients: EntrepreneurPulse.com.au  Fact Sheet for Healthcare  Providers: IncredibleEmployment.be  This test is not yet approved or cleared by the [place] and has been authorized for  detection and/or diagnosis of SARS-CoV-2 by FDA under an Emergency Use Authorization (EUA). This EUA will remain in effect (meaning this test can be used) for the duration of the COVID-19 declaration under Section 564(b)(1) of the Act, 21 U.S.C. section 360bbb-3(b)(1), unless the authorization is terminated or revoked.  Performed at [facility], [address] [place], [place] [address]   POC SARS Coronavirus 2 Ag-ED - Nasal Swab (BD Veritor Kit)     Status: None   Collection Time: 09/23/20 11:33 AM  Result Value Ref Range   SARS Coronavirus 2 Ag NEGATIVE NEGATIVE    Comment: (NOTE) SARS-CoV-2 antigen NOT DETECTED.   Negative results are presumptive.  Negative results do not preclude SARS-CoV-2 infection and should not be used as the sole basis for treatment or other patient management decisions, including infection  control decisions, particularly in the presence of clinical signs and  symptoms consistent with COVID-19, or in those who have been in contact with the virus.  Negative results must be combined with clinical observations, patient history, and epidemiological information. The expected result is Negative.  Fact Sheet for Patients: PodPark.tn  Fact Sheet for Healthcare Providers: GiftContent.is   This test is not yet approved or cleared by the [place] FDA and  has been authorized for detection and/or diagnosis of SARS-CoV-2 by FDA under an Emergency Use Authorization (EUA).  This EUA will remain in effect (meaning this test can be used) for the duration of  the C OVID-19 declaration under Section 564(b)(1) of the Act, 21 U.S.C. section 360bbb-3(b)(1), unless the authorization is terminated or revoked sooner.    Urinalysis, Complete w  Microscopic     Status: Abnormal   Collection Time: 09/23/20  2:13 PM  Result Value Ref Range   Color, Urine YELLOW (A) YELLOW   APPearance HAZY (A) CLEAR   Specific Gravity, Urine 1.039 (H) 1.005 - 1.030   pH 5.0 5.0 - 8.0   Glucose, UA >=500 (A) NEGATIVE mg/dL   Hgb urine dipstick SMALL (A) NEGATIVE   Bilirubin Urine NEGATIVE NEGATIVE   Ketones, ur 5 (A) NEGATIVE mg/dL   Protein, ur >=300 (A) NEGATIVE mg/dL   Nitrite POSITIVE (A) NEGATIVE   Leukocytes,Ua NEGATIVE NEGATIVE   RBC / HPF 0-5 0 - 5 RBC/hpf   WBC, UA 0-5 0 - 5 WBC/hpf   Bacteria, UA NONE SEEN NONE SEEN   Squamous Epithelial / LPF NONE SEEN 0 - 5    Comment: Performed at [facility], [address] [place], [place] [address]   DG Chest 2 View  Result Date: 09/23/2020 CLINICAL DATA:  Sepsis EXAM: CHEST - 2 VIEW COMPARISON:  October 06, 2008 FINDINGS: Lungs are clear. Heart is upper normal in size with pulmonary vascularity normal. No adenopathy. There is mild degenerative change in the thoracic spine. IMPRESSION: No edema or airspace opacity.  Heart upper normal in size. Electronically Signed   By: Lowella Grip III M.D.   On: 09/23/2020 08:12   CT ABDOMEN PELVIS W CONTRAST  Result Date: 09/23/2020 CLINICAL DATA:  Abdominal pain EXAM: CT ABDOMEN AND PELVIS WITH CONTRAST TECHNIQUE: Multidetector CT imaging of the abdomen and pelvis was performed using the standard protocol following bolus administration of intravenous contrast. CONTRAST:  125m OMNIPAQUE IOHEXOL 300 MG/ML  SOLN COMPARISON:  CT chest 2005 FINDINGS: Lower chest: Bibasilar atelectasis. Hepatobiliary: No focal liver abnormality is seen. No gallstones, gallbladder wall thickening, or biliary dilatation. Pancreas: Unremarkable. Spleen: Unremarkable. Adrenals/Urinary Tract: There is a 3.4 x  2.8 cm lesion of the right adrenal, which was also present on a 2005 chest CT and is therefore likely benign. Two 3 mm nonobstructing calculi of the lower pole of  the right kidney. Small cyst of the interpolar right kidney. Partially distended bladder is unremarkable. Stomach/Bowel: Stomach is within normal limits. Bowel is normal in caliber. Normal appendix. Vascular/Lymphatic: Aortoiliac atherosclerosis. No enlarged lymph nodes identified. There are nonspecific small para-aortic retroperitoneal nodes with adjacent fat infiltration. Reproductive: Uterus and bilateral adnexa are unremarkable. Other: No ascites.  No acute abnormality of the abdominal wall. Musculoskeletal: Advanced degenerative changes of the included spine. No acute osseous abnormality. IMPRESSION: No acute abnormality. Small nonobstructing right renal calculi. Aortic atherosclerosis. Electronically Signed   By: Macy Mis M.D.   On: 09/23/2020 09:39   DG Foot 2 Views Left  Result Date: 09/23/2020 CLINICAL DATA:  Soft tissue wound with redness EXAM: LEFT FOOT - 2 VIEW COMPARISON:  None. FINDINGS: Frontal and lateral views were obtained. There is generalized soft tissue swelling. No soft tissue air. No fracture or dislocation. There is spurring in the dorsal midfoot region. Other joint spaces appear unremarkable. No erosive change or bony destruction. There are prominent posterior and inferior calcaneal spurs. There is calcification in much of the posterior plantar fascia. IMPRESSION: Soft tissue swelling without soft tissue air. No erosion or bony destruction. Spurring dorsal midfoot. No fracture or dislocation. Prominent calcaneal spurs. There is plantar fascia calcification. Electronically Signed   By: Lowella Grip III M.D.   On: 09/23/2020 08:14    Blood pressure 130/68, pulse 100, temperature (!) 102.5 F (39.2 C), temperature source Axillary, resp. rate 14, height 5' 4"  (1.626 m), weight 117.9 kg, SpO2 96 %.  Assessment 1. Left foot fifth metatarsal phalangeal joint ulceration with stable necrotic eschar 2. Mild left foot cellulitis 3. Diabetes type 2 polyneuropathy 4. PVD status  post right BKA  Plan -Patient seen and examined -X-ray imaging reviewed discussed with patient in detail.  No obvious evidence of osteomyelitis noted. -Wound examined today and debridement performed as described low without incident.  Patient tolerated procedure well.  Does not probe to bone at this time. -Wound appears to be stable at this time to the plantar aspect of the right foot and appears to have a stable eschar centrally with dermis exposed around the periphery of the wound. -Applied Betadine wet-to-dry dressing. -Recommend Santyl dressings daily applying Santyl to the wound followed by 4 x 4 gauze, ABD, Kerlix, Ace wrap with minimal compression. -Vascular team was consulted for further evaluation of circulation since she has a extensive history of PVD.  Appreciate recommendations per Dr. Lucky Cowboy, plans for angiogram in the next coming days. -MRI is ordered and pending to assess for osteomyelitis further.  Clinically does not appear to be present but will have MRI performed to see if there is any deeper infection which could require subsequent incision and drainage or amputation. -Appreciate medicine recommendations for antibiotics. -Patient can be partial weightbearing in a surgical shoe at this time to left foot.  Wound debridement left foot fifth metatarsal phalangeal joint procedure Verbal consent was obtained. 15 blade was used to debride all hyperkeratotic tissue and some fibrous tissue as well as biofilm from the left fifth metatarsal phalangeal joint ulceration.  Predebridement measurement measured approximately 2 x 2 cm by dermis, post debridement measurement measured approximately the same.  Patient tolerated procedure well.  Wound bed appeared to be granular overall and the area of the dermis but appeared to  have central area of necrotic Or stable eschar.  Did not probe to bone.   Caroline More, D.P.M. 09/23/2020, 2:47 PM

## 2020-09-23 NOTE — ED Provider Notes (Addendum)
[facility] Emergency Department Provider Note  Time seen: 8:30 AM  I have reviewed the triage vital signs and the nursing notes.   HISTORY  Chief Complaint Emesis   HPI [name] is a 59 y.o. female with a past medical history of anemia, anxiety, arthritis, CHF, COPD, CAD, diabetes, fibromyalgia, gastric reflux, hypertension, right BKA presents to the emergency department nausea vomiting with a fever.  According to the patient she was recently admitted to [facility] for left foot infection states she was admitted for approximately a week receiving IV antibiotics and was ultimately discharged home several days ago.  Patient states last night she developed chills and weakness this morning she was nauseated with vomiting.  Continues to state significant pain aching type pain in her left foot, now febrile to 102.5 upon arrival to the emergency department.  Denies any cough, shortness of breath, chest pain.  Does state some lower abdominal discomfort this morning as well but relates this to vomiting.  Past Medical History:  Diagnosis Date   Anemia    Anxiety    Arthritis    CHF (congestive heart failure) (HCC)    COPD (chronic obstructive pulmonary disease) (HCC)    Coronary artery disease    Diabetes mellitus without complication (HCC)    Fibromyalgia    GERD (gastroesophageal reflux disease)    Hypertension    Peripheral vascular disease (HCC)    Sleep apnea    Stroke [facility])     Patient Active Problem List   Diagnosis Date Noted   Open toe wound 12/20/2016   Lower limb ulcer, calf, right, limited to breakdown of skin (HCC) 11/22/2016   PAD (peripheral artery disease) (HCC) 10/20/2016   Diabetes (HCC) 09/13/2016   Essential hypertension, benign 09/13/2016   Morbid obesity (HCC) 09/13/2016   Atherosclerosis of native arteries of the extremities with ulceration (HCC) 09/13/2016   Tobacco use disorder 09/13/2016   Carotid artery  obstruction 01/29/2015    Past Surgical History:  Procedure Laterality Date   CESAREAN SECTION     CORONARY ANGIOPLASTY WITH STENT PLACEMENT Left    DILATION AND CURETTAGE OF UTERUS     ENDARTERECTOMY Left 01/29/2015   Procedure: ENDARTERECTOMY CAROTID;  Surgeon: Annice Needy, MD;  Location: [facility] ORS;  Service: Vascular;  Laterality: Left;   PERIPHERAL VASCULAR CATHETERIZATION Right 05/27/2015   Procedure: Lower Extremity Angiography;  Surgeon: Annice Needy, MD;  Location: [facility] INVASIVE CV LAB;  Service: Cardiovascular;  Laterality: Right;   PERIPHERAL VASCULAR CATHETERIZATION  05/27/2015   Procedure: Lower Extremity Intervention;  Surgeon: Annice Needy, MD;  Location: [facility] INVASIVE CV LAB;  Service: Cardiovascular;;   PERIPHERAL VASCULAR CATHETERIZATION Right 09/22/2016   Procedure: Lower Extremity Angiography;  Surgeon: Annice Needy, MD;  Location: [facility] INVASIVE CV LAB;  Service: Cardiovascular;  Laterality: Right;   TUBAL LIGATION      Prior to Admission medications   Medication Sig Start Date End Date Taking? Authorizing Provider  ACCU-CHEK AVIVA PLUS test strip See admin instructions. 01/01/17   [provider]  aspirin EC 81 MG tablet Take 81 mg by mouth daily.    [provider]  buPROPion (WELLBUTRIN SR) 150 MG 12 hr tablet TAKE 1 TABLET BY MOUTH 3 TIMES A DAY FOR 3 DAYS THEN 1 TABLET TWICE A DAY AFTER 08/03/16   [provider]  BYDUREON 2 MG PEN INJECT SUBCUTANEOUSLY ONCE WEEKLY FOR HIGH BLOOD SUGAR 09/17/16   [provider]  carvedilol (COREG) 3.125  MG tablet Take 3.125 mg by mouth 2 (two) times daily with a meal.     [provider]  clopidogrel (PLAVIX) 75 MG tablet Take 75 mg by mouth daily.    [provider]  enalapril (VASOTEC) 10 MG tablet 1 BY MOUTH DAILY FOR HIGH BLOOD PRESSURE 09/28/15   [provider]  fexofenadine (ALLEGRA) 180 MG tablet Take by mouth.    [provider]  furosemide (LASIX) 40 MG  tablet Take 40 mg by mouth daily.    [provider]  gabapentin (NEURONTIN) 300 MG capsule Take 300 mg by mouth daily as needed (for nerve pain).     [provider]  glipiZIDE (GLUCOTROL) 10 MG tablet 1 BY MOUTH TWICE A DAY FOR DIABETES 09/17/16   [provider]  HUMULIN R U-500 KWIKPEN 500 UNIT/ML kwikpen INJECT 100 UNITS BEFORE BREAKFAST, 50 UNITS BEFORE SUPPER. 11/09/16   [provider]  ibuprofen (ADVIL,MOTRIN) 200 MG tablet Take 200 mg by mouth every 6 (six) hours as needed.    [provider]  JANUVIA 100 MG tablet 1 BY MOUTH DAILY FOR DIABETES 09/17/16   [provider]  metFORMIN (GLUCOPHAGE) 1000 MG tablet Take 1,000 mg by mouth 2 (two) times daily with a meal.    [provider]  methocarbamol (ROBAXIN) 500 MG tablet 1-2 BY MOUTH EVERY 6 HOURS AS NEEDED FOR MUSCLE SPASM 12/14/16   [provider]  naproxen (NAPROSYN) 500 MG tablet 1 BY MOUTH TWICE DAILY FOR 10 DAYS THEN AS NEEDED FOR PAIN 12/14/16   [provider]  oxyCODONE-acetaminophen (PERCOCET/ROXICET) 5-325 MG tablet Take 1 tablet by mouth every 6 (six) hours as needed. for pain 11/15/16   [provider]  pantoprazole (PROTONIX) 40 MG tablet Take 40 mg by mouth daily.    [provider]  SANTYL ointment OINTMENT TOPICAL AS DIRECTED 10/31/16   [provider]  sertraline (ZOLOFT) 100 MG tablet Take 150 mg by mouth daily.     [provider]  simvastatin (ZOCOR) 40 MG tablet Take 40 mg by mouth daily.     [provider]  traMADol (ULTRAM) 50 MG tablet Take 1 tablet (50 mg total) by mouth every 6 (six) hours as needed. 08/02/16   Cuthriell, Delorise Royals, PA-C  traZODone (DESYREL) 50 MG tablet Take 50 mg by mouth at bedtime.    [provider]    Allergies  Allergen Reactions   Niacin And Related Nausea And Vomiting    No family history on file.  Social History Social History   Tobacco Use    Smoking status: Former Smoker    Packs/day: 0.25    Years: 38.00    Pack years: 9.50    Types: Cigarettes    Quit date: 09/12/2016    Years since quitting: 4.0   Smokeless tobacco: Current User  Substance Use Topics   Alcohol use: No   Drug use: No    Review of Systems Constitutional: Positive for fever Cardiovascular: Negative for chest pain. Respiratory: Negative for shortness of breath. Gastrointestinal: Mild lower abdominal pain.  Negative for diarrhea.  Positive for nausea vomiting. Genitourinary: Negative for urinary compaints Musculoskeletal: Negative for musculoskeletal complaints Neurological: Negative for headache All other ROS negative  ____________________________________________   PHYSICAL EXAM:  VITAL SIGNS: ED Triage Vitals  Enc Vitals Group     BP 09/23/20 0706 140/79     Pulse Rate 09/23/20 0706 (!) 127     Resp 09/23/20 0706 20  Temp 09/23/20 0706 (!) 102.5 F (39.2 C)     Temp Source 09/23/20 0706 Axillary     SpO2 09/23/20 0653 95 %     Weight 09/23/20 0708 260 lb (117.9 kg)     Height 09/23/20 0708 5\' 4"  (1.626 m)     Head Circumference --      Peak Flow --      Pain Score 09/23/20 0707 0     Pain Loc --      Pain Edu? --      Excl. in GC? --    Constitutional: Patient is awake alert oriented.  Overall well-appearing. Eyes: Normal exam ENT      Head: Normocephalic and atraumatic.      Mouth/Throat: Somewhat dry mucous membranes requesting something to drink. Cardiovascular: Regular rhythm rate around 120 bpm.  No obvious murmur. Respiratory: Normal respiratory effort without tachypnea nor retractions. Breath sounds are clear and equal bilaterally. No wheezes/rales/rhonchi. Gastrointestinal: Soft, mild to moderate suprapubic tenderness to palpation otherwise benign abdomen.  Obese.  No rebound guarding or distention. Musculoskeletal: Status post right BKA.  Patient does have a wound approximately 4 cm in diameter to the lateral aspect  of her left foot with moderate tenderness to palpation of this area.   Neurologic:  Normal speech and language. No gross focal neurologic deficits  Skin:  Skin is warm, dry and intact.  Psychiatric: Mood and affect are normal  ____________________________________________    EKG  EKG viewed and interpreted by myself shows sinus tachycardia 116 bpm with a slightly widened QRS, normal axis, largely normal intervals besides slight QTC prolongation.  Nonspecific ST changes.  ____________________________________________    RADIOLOGY  Left foot x-ray shows soft tissue swelling without air or bony destruction. Chest x-ray is negative for acute abnormality.  ____________________________________________   INITIAL IMPRESSION / ASSESSMENT AND PLAN / ED COURSE  Pertinent labs & imaging results that were available during my care of the patient were reviewed by me and considered in my medical decision making (see chart for details).   Patient presents to the emergency department for fever nausea vomiting.  In reviewing the patient's care everywhere chart from [facility] patient was admitted 08/29/2020 and discharged 09/05/2020 for shortness of breath and left foot cellulitis as well as akathisia.  Patient had antibiotics doxycycline through 09/07/2020.  Patient found to be febrile and tachycardic with leukocytosis meeting sepsis criteria.  Given the patient's recent cellulitis admission to [facility] we will start the patient on broad-spectrum antibiotics.  Given the patient's fever we will check a Covid swab.  We will IV hydrate.  X-ray of the foot shows swelling without gas or osteomyelitis.  Does have mild erythema of the left lower extremity could be cellulitis, differential would also include bacteremia, Covid.  Patient does have mild to moderate suprapubic tenderness to palpation we will also check a urine sample and obtain CT imaging the abdomen to rule out intra-abdominal pathology or urinary  tract infection.  Patient agreeable to plan of care.  Patient is Covid rapid test is negative.  We will continue with broad-spectrum antibiotics.  Patient will be admitted to the hospital service for further work-up and treatment.  JAZMARIE BIEVER was evaluated in Emergency Department on 09/23/2020 for the symptoms described in the history of present illness. She was evaluated in the context of the global COVID-19 pandemic, which necessitated consideration that the patient might be at risk for infection with the SARS-CoV-2 virus that causes COVID-19. Institutional protocols and  algorithms that pertain to the evaluation of patients at risk for COVID-19 are in a state of rapid change based on information released by regulatory bodies including the CDC and federal and state organizations. These policies and algorithms were followed during the patient's care in the ED.  CRITICAL CARE Performed by: Minna AntisKevin Aryan Sparks   Total critical care time: 30 minutes  Critical care time was exclusive of separately billable procedures and treating other patients.  Critical care was necessary to treat or prevent imminent or life-threatening deterioration.  Critical care was time spent personally by me on the following activities: development of treatment plan with patient and/or surrogate as well as nursing, discussions with consultants, evaluation of patient's response to treatment, examination of patient, obtaining history from patient or surrogate, ordering and performing treatments and interventions, ordering and review of laboratory studies, ordering and review of radiographic studies, pulse oximetry and re-evaluation of patient's condition.   ____________________________________________   FINAL CLINICAL IMPRESSION(S) / ED DIAGNOSES  Sepsis Foot ulceration Nausea vomiting       Minna AntisPaduchowski, Jovi Alvizo, MD 09/23/20 1247

## 2020-09-23 NOTE — ED Notes (Signed)
Messaged MD Maisie Fus regarding critical troponin result. Gave ascom number for callback at this time

## 2020-09-23 NOTE — ED Notes (Signed)
Pt cleaned up after saturating the bed with urine. Pt had small BM. Pt placed in clean gown and clean brief. Clean linens placed on bed and clean chux. Pt given warm blanket and pillow. Pt pulled up in bed. Family at bedside. Pt placed back on monitor.

## 2020-09-23 NOTE — Consult Note (Signed)
[facility] Vascular Consult Note  MRN : [id number]  [name] is a 59 y.o. (10-19-1960) female who presents with chief complaint of  Chief Complaint  Patient presents with  . Emesis  .  History of Present Illness: I am asked to see the patient by Dr. Maisie Fus and Dr. Excell Seltzer for evaluation of a nonhealing ulceration on the left foot.  The patient has previously seen me but it appears that the last time was about 3-1/2 years ago.  I performed a carotid endarterectomy on her several years before that and a right leg intervention 4 years ago.  She went on to get treatment at [facility] and ultimately lost her right leg.  She was in [facility] recently and was also told that she had recurrent carotid disease.  No recent focal neurologic symptoms.  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  No fevers or chills or signs of systemic infection.  No real pain in the area to speak of.  Given her nonhealing ulceration with previous history of PAD, we are consulted for further evaluation and treatment  Current Facility-Administered Medications  Medication Dose Route Frequency Provider Last Rate Last Admin  . 0.9 %  sodium chloride infusion   Intravenous Continuous Lurline Del, MD   Held at 09/23/20 1418  . albuterol (PROVENTIL) (2.5 MG/3ML) 0.083% nebulizer solution 2.5 mg  2.5 mg Nebulization Q2H PRN Skip Mayer A, MD      . ceFEPIme (MAXIPIME) 2 g in sodium chloride 0.9 % 100 mL IVPB  2 g Intravenous Q8H Foye Deer, RPH      . enoxaparin (LOVENOX) injection 60 mg  0.5 mg/kg Subcutaneous Q24H Skip Mayer A, MD      . HYDROcodone-acetaminophen (NORCO/VICODIN) 5-325 MG per tablet 1-2 tablet  1-2 tablet Oral Q4H PRN Lurline Del, MD   1 tablet at 09/23/20 1412  . ibuprofen (ADVIL) tablet 400 mg  400 mg Oral Q6H PRN Lurline Del, MD      . insulin aspart (novoLOG) injection 0-20 Units  0-20 Units Subcutaneous TID WC Skip Mayer A,  MD      . insulin aspart (novoLOG) injection 0-5 Units  0-5 Units Subcutaneous QHS Skip Mayer A, MD      . insulin aspart (novoLOG) injection 15 Units  15 Units Subcutaneous TID WC Skip Mayer A, MD      . insulin regular human CONCENTRATED (HUMULIN R) 500 UNIT/ML kwikpen 50 Units  50 Units Subcutaneous Q supper Lurline Del, MD      . Melene Muller ON 09/24/2020] insulin regular human CONCENTRATED (HUMULIN R) 500 UNIT/ML kwikpen 70 Units  70 Units Subcutaneous Q breakfast Skip Mayer A, MD      . ondansetron [facility]) tablet 4 mg  4 mg Oral Q6H PRN Lurline Del, MD       Or  . ondansetron [facility]) injection 4 mg  4 mg Intravenous Q6H PRN Lurline Del, MD      . vancomycin (VANCOREADY) IVPB 750 mg/150 mL  750 mg Intravenous Q12H Foye Deer, [facility]       Current Outpatient Medications  Medication Sig Dispense Refill  . atorvastatin (LIPITOR) 40 MG tablet Take 40 mg by mouth daily.    Marland Kitchen buPROPion (WELLBUTRIN XL) 150 MG 24 hr tablet Take 150 mg by mouth daily. (take with 300mg  tablet to equal 450mg  total)    . buPROPion (WELLBUTRIN XL) 300 MG 24 hr tablet  Take 300 mg by mouth daily. (take with 150mg  tablet to equal 450mg  total)    . cetirizine (ZYRTEC) 10 MG tablet Take 10 mg by mouth daily.    . clopidogrel (PLAVIX) 75 MG tablet Take 75 mg by mouth daily.    . Dulaglutide (TRULICITY) 0.75 MG/0.5ML SOPN Inject 0.75 mg into the skin every Tuesday.    . DULoxetine (CYMBALTA) 60 MG capsule Take 60 mg by mouth daily.    . enalapril (VASOTEC) 5 MG tablet Take 5 mg by mouth daily.    gabapentin (NEURONTIN) 300 MG capsule Take 300-600 mg by mouth at bedtime.    Wednesday HUMULIN R U-500 KWIKPEN 500 UNIT/ML kwikpen Inject 50-70 Units into the skin See admin instructions. Inject 70u under the skin every morning before breakfast and inject 50u under the skin every evening before supper  3  . metFORMIN (GLUCOPHAGE-XR) 500 MG 24 hr tablet Take 500 mg by mouth daily with supper.     Marland Kitchen oxybutynin (DITROPAN-XL) 10 MG 24 hr tablet Take 10 mg by mouth daily.    . pantoprazole (PROTONIX) 40 MG tablet Take 40 mg by mouth daily.    . traZODone (DESYREL) 50 MG tablet Take 100 mg by mouth at bedtime.      Past Medical History:  Diagnosis Date  . Anemia   . Anxiety   . Arthritis   . CHF (congestive heart failure) (HCC)   . COPD (chronic obstructive pulmonary disease) (HCC)   . Coronary artery disease   . Diabetes mellitus without complication (HCC)   . Fibromyalgia   . GERD (gastroesophageal reflux disease)   . Hypertension   . Peripheral vascular disease (HCC)   . Sleep apnea   . Stroke [facility])     Past Surgical History:  Procedure Laterality Date  . CESAREAN SECTION    . CORONARY ANGIOPLASTY WITH STENT PLACEMENT Left   . DILATION AND CURETTAGE OF UTERUS    . ENDARTERECTOMY Left 01/29/2015   Procedure: ENDARTERECTOMY CAROTID;  Surgeon: [facility], MD;  Location: [facility] ORS;  Service: Vascular;  Laterality: Left;  . PERIPHERAL VASCULAR CATHETERIZATION Right 05/27/2015   Procedure: Lower Extremity Angiography;  Surgeon: Annice Needy, MD;  Location: [facility] INVASIVE CV LAB;  Service: Cardiovascular;  Laterality: Right;  . PERIPHERAL VASCULAR CATHETERIZATION  05/27/2015   Procedure: Lower Extremity Intervention;  Surgeon: Annice Needy, MD;  Location: [facility] INVASIVE CV LAB;  Service: Cardiovascular;;  . PERIPHERAL VASCULAR CATHETERIZATION Right 09/22/2016   Procedure: Lower Extremity Angiography;  Surgeon: Annice Needy, MD;  Location: [facility] INVASIVE CV LAB;  Service: Cardiovascular;  Laterality: Right;  . TUBAL LIGATION       Social History   Tobacco Use  . Smoking status: Former Smoker    Packs/day: 0.25    Years: 38.00    Pack years: 9.50    Types: Cigarettes    Quit date: 09/12/2016    Years since quitting: 4.0  . Smokeless tobacco: Current User  Substance Use Topics  . Alcohol use: No  . Drug use: No    Family History No bleeding disorders, clotting disorders,  autoimmune diseases, or aneurysms  Allergies  Allergen Reactions  . Niacin And Related Nausea And Vomiting     REVIEW OF SYSTEMS (Negative unless checked)  Constitutional: [] Weight loss  [] Fever  [] Chills Cardiac: [] Chest pain   [] Chest pressure   [] Palpitations   [] Shortness of breath when laying flat   [] Shortness of breath at rest   [] Shortness  of breath with exertion. Vascular:  [] Pain in legs with walking   [] Pain in legs at rest   [] Pain in legs when laying flat   [] Claudication   [] Pain in feet when walking  [] Pain in feet at rest  [] Pain in feet when laying flat   [] History of DVT   [] Phlebitis   [] Swelling in legs   [] Varicose veins   [x] Non-healing ulcers Pulmonary:   [] Uses home oxygen   [] Productive cough   [] Hemoptysis   [] Wheeze  [x] COPD   [] Asthma Neurologic:  [] Dizziness  [] Blackouts   [] Seizures   [] History of stroke   [] History of TIA  [] Aphasia   [] Temporary blindness   [] Dysphagia   [] Weakness or numbness in arms   [] Weakness or numbness in legs Musculoskeletal:  [x] Arthritis   [] Joint swelling   [x] Joint pain   [] Low back pain Hematologic:  [] Easy bruising  [] Easy bleeding   [] Hypercoagulable state   [x] Anemic  [] Hepatitis Gastrointestinal:  [] Blood in stool   [] Vomiting blood  [] Gastroesophageal reflux/heartburn   [] Difficulty swallowing. Genitourinary:  [] Chronic kidney disease   [] Difficult urination  [] Frequent urination  [] Burning with urination   [] Blood in urine Skin:  [] Rashes   [x] Ulcers   [x] Wounds Psychological:  [x] History of anxiety   []  History of major depression.  Physical Examination  Vitals:   09/23/20 1200 09/23/20 1304 09/23/20 1400 09/23/20 1527  BP: 102/77 (!) 105/94 130/68   Pulse: (!) 116 (!) 115 100   Resp: (!) 22 (!) 21 14   Temp:    (!) 101.4 F (38.6 C)  TempSrc:      SpO2: 95% 95% 96%   Weight:      Height:       Body mass index is 44.63 kg/m. Gen:  WD/WN, NAD.  Morbidly obese Head: [place]/AT, No temporalis wasting.   Ear/Nose/Throat: Hearing grossly intact, nares w/o erythema or drainage, oropharynx w/o Erythema/Exudate Eyes: Sclera non-icteric, conjunctiva clear Neck: Trachea midline.  No JVD.  Pulmonary:  Good air movement, respirations not labored, equal bilaterally.  Cardiac: Tachycardic Vascular:  Vessel Right Left  Radial Palpable Palpable                          PT  not palpable  trace palpable  DP  not palpable  1+ palpable   Gastrointestinal: soft, non-tender/non-distended. No guarding/reflex.  Musculoskeletal: M/S 5/5 throughout.  Extremities without ischemic changes.  Right BKA well-healed. Neurologic: Sensation grossly intact in extremities.  Symmetrical.  Speech is fluent. Motor exam as listed above. Psychiatric: Judgment intact, Mood & affect appropriate for pt's clinical situation. Dermatologic: Relatively clean, superficial ulceration on the left lateral lower foot just below the fifth metatarsal head area.  No significant surrounding erythema.  No drainage.      CBC Lab Results  Component Value Date   WBC 17.1 (H) 09/23/2020   HGB 11.3 (L) 09/23/2020   HCT 35.6 (L) 09/23/2020   MCV 83.4 09/23/2020   PLT 256 09/23/2020    BMET    Component Value Date/Time   NA 133 (L) 09/23/2020 0707   NA 137 01/22/2015 1146   K 4.0 09/23/2020 0707   K 4.2 01/22/2015 1146   CL 99 09/23/2020 0707   CL 101 01/22/2015 1146   CO2 24 09/23/2020 0707   CO2 29 01/22/2015 1146   GLUCOSE 350 (H) 09/23/2020 0707   GLUCOSE 171 (H) 01/22/2015 1146   BUN 16 09/23/2020 0707   BUN 12  01/22/2015 1146   CREATININE 0.88 09/23/2020 0707   CREATININE 0.81 01/22/2015 1146   CALCIUM 8.9 09/23/2020 0707   CALCIUM 9.2 01/22/2015 1146   GFRNONAA >60 09/23/2020 0707   GFRNONAA >60 01/22/2015 1146   GFRAA >60 09/20/2016 1431   GFRAA >60 01/22/2015 1146   Estimated Creatinine Clearance: 85.9 mL/min (by C-G formula based on SCr of 0.88 mg/dL).  COAG Lab Results  Component Value Date   INR  1.1 09/23/2020   INR 1.1 01/22/2015   INR 1.1 08/08/2014    Radiology DG Chest 2 View  Result Date: 09/23/2020 CLINICAL DATA:  Sepsis EXAM: CHEST - 2 VIEW COMPARISON:  October 06, 2008 FINDINGS: Lungs are clear. Heart is upper normal in size with pulmonary vascularity normal. No adenopathy. There is mild degenerative change in the thoracic spine. IMPRESSION: No edema or airspace opacity.  Heart upper normal in size. Electronically Signed   By: Bretta Bang III M.D.   On: 09/23/2020 08:12   CT ABDOMEN PELVIS W CONTRAST  Result Date: 09/23/2020 CLINICAL DATA:  Abdominal pain EXAM: CT ABDOMEN AND PELVIS WITH CONTRAST TECHNIQUE: Multidetector CT imaging of the abdomen and pelvis was performed using the standard protocol following bolus administration of intravenous contrast. CONTRAST:  OMNIPAQUE IOHEXOL 300 MG/ML  SOLN COMPARISON:  CT chest 2005 FINDINGS: Lower chest: Bibasilar atelectasis. Hepatobiliary: No focal liver abnormality is seen. No gallstones, gallbladder wall thickening, or biliary dilatation. Pancreas: Unremarkable. Spleen: Unremarkable. Adrenals/Urinary Tract: There is a 3.4 x 2.8 cm lesion of the right adrenal, which was also present on a 2005 chest CT and is therefore likely benign. Two 3 mm nonobstructing calculi of the lower pole of the right kidney. Small cyst of the interpolar right kidney. Partially distended bladder is unremarkable. Stomach/Bowel: Stomach is within normal limits. Bowel is normal in caliber. Normal appendix. Vascular/Lymphatic: Aortoiliac atherosclerosis. No enlarged lymph nodes identified. There are nonspecific small para-aortic retroperitoneal nodes with adjacent fat infiltration. Reproductive: Uterus and bilateral adnexa are unremarkable. Other: No ascites.  No acute abnormality of the abdominal wall. Musculoskeletal: Advanced degenerative changes of the included spine. No acute osseous abnormality. IMPRESSION: No acute abnormality. Small nonobstructing  right renal calculi. Aortic atherosclerosis. Electronically Signed   By: Guadlupe Spanish M.D.   On: 09/23/2020 09:39   DG Foot 2 Views Left  Result Date: 09/23/2020 CLINICAL DATA:  Soft tissue wound with redness EXAM: LEFT FOOT - 2 VIEW COMPARISON:  None. FINDINGS: Frontal and lateral views were obtained. There is generalized soft tissue swelling. No soft tissue air. No fracture or dislocation. There is spurring in the dorsal midfoot region. Other joint spaces appear unremarkable. No erosive change or bony destruction. There are prominent posterior and inferior calcaneal spurs. There is calcification in much of the posterior plantar fascia. IMPRESSION: Soft tissue swelling without soft tissue air. No erosion or bony destruction. Spurring dorsal midfoot. No fracture or dislocation. Prominent calcaneal spurs. There is plantar fascia calcification. Electronically Signed   By: Bretta Bang III M.D.   On: 09/23/2020 08:14      Assessment/Plan 1.  PAD with ulceration left lower extremity.  Has already lost her right leg for similar reasons 3 to 4 years ago.  Had intervention on the right leg 4 years ago but ultimately lost the leg due to continued infection.  Although her ulceration is shallow, given her multiple other comorbidities she is still quite high risk of limb loss.  I would recommend a left lower extremity angiogram and we will  plan on doing this tomorrow.  I discussed the risks and benefits of the procedure.  She is agreeable to proceed. 2.  Carotid artery stenosis.  Over 5 years status post right carotid endarterectomy.  Was told there was recurrent stenosis at [facility].  Her lower extremity situation takes precedence now, but this will need to be evaluated.  We can do a carotid duplex while she is in the hospital or as an outpatient when she is discharged. 3.  Hypertension.  Stable on outpatient medications and blood pressure control important in reducing the progression of atherosclerotic  disease. On appropriate oral medications. 4.  Diabetes. blood glucose control important in reducing the progression of atherosclerotic disease. Also, involved in wound healing. On appropriate medications.    Rendy Lazard, MD  09/23/2020 4:26 PM    This note was created with Dragon medical transcription system.  Any error is purely unintentional 

## 2020-09-23 NOTE — ED Notes (Signed)
Called lab and spoke with Joni Reining and she will add on tests to blood already in lab.

## 2020-09-23 NOTE — ED Notes (Addendum)
Per verbal order by MD Maisie Fus, "so d/c base and give 65 units.  d/c 15units base with meal and only use the quick pen with sliding scale"   ,

## 2020-09-23 NOTE — ED Notes (Signed)
Pt cleaned up and clean external cath placed. Pt placed in clean brief and clean chuxs placed on bed. Pt pulled up in bed. Pt given warm blankets.

## 2020-09-23 NOTE — ED Notes (Signed)
Meal tray given at this time 

## 2020-09-23 NOTE — H&P (Addendum)
History and Physical    [name] ZOX:[id number] DOB: [birth year] DOA: 09/23/2020  PCP: Sandrea Hughs, NP  Patient coming from: home  I have personally briefly reviewed patient's old medical records in [facility]  Chief Complaint: fever/chills   HPI: [name] is a 59 y.o. female with medical history significant of  of anemia, anxiety, arthritis, CHF, COPD, CAD, diabetes, fibromyalgia, gastric reflux, hypertension,CAD,OSA,PVD s/p right BKA, with history of recent admission to [facility] 12/4-12/11/21 for left foot cellulitis s due to infected laceration.She was initially placed on clindamycin in ED, transitioned to Vancomycin with improvement in cellulitis. Transitioned to Doxycycline to continue through 12/13, as well as continue local wound care on discharge. Patient now presents to ed with history of fever/chills/n/v x 12-24 hours.She also noted continued pain of left foot.  ED Course:  Vitals:102.5, bp 140/79, hr 127, rr 20 Labs: na 133,glu 350, cr 0.88,  Wbc 17.1, hgb 11.3 close to base of 12 cxr:No edema or airspace opacity.  Heart upper normal in size.  Foot xray:Soft tissue swelling without soft tissue air. No erosion or bony destruction. Spurring dorsal midfoot. No fracture or dislocation. Prominent calcaneal spurs. There is plantar fascia calcification.  CT abd/pelvis: No acute abnormality. small nonobstructing right renal calculi. Aortic atherosclerosis.   Tx: vanc/cefepime  Review of Systems: As per HPI otherwise 10 point review of systems negative.   Past Medical History:  Diagnosis Date  . Anemia   . Anxiety   . Arthritis   . CHF (congestive heart failure) (HCC)   . COPD (chronic obstructive pulmonary disease) (HCC)   . Coronary artery disease   . Diabetes mellitus without complication (HCC)   . Fibromyalgia   . GERD (gastroesophageal reflux disease)   . Hypertension   . Peripheral vascular disease (HCC)   . Sleep apnea   . Stroke [facility])     Past  Surgical History:  Procedure Laterality Date  . CESAREAN SECTION    . CORONARY ANGIOPLASTY WITH STENT PLACEMENT Left   . DILATION AND CURETTAGE OF UTERUS    . ENDARTERECTOMY Left 01/29/2015   Procedure: ENDARTERECTOMY CAROTID;  Surgeon: Annice Needy, MD;  Location: [facility] ORS;  Service: Vascular;  Laterality: Left;  . PERIPHERAL VASCULAR CATHETERIZATION Right 05/27/2015   Procedure: Lower Extremity Angiography;  Surgeon: Annice Needy, MD;  Location: [facility] INVASIVE CV LAB;  Service: Cardiovascular;  Laterality: Right;  . PERIPHERAL VASCULAR CATHETERIZATION  05/27/2015   Procedure: Lower Extremity Intervention;  Surgeon: Annice Needy, MD;  Location: [facility] INVASIVE CV LAB;  Service: Cardiovascular;;  . PERIPHERAL VASCULAR CATHETERIZATION Right 09/22/2016   Procedure: Lower Extremity Angiography;  Surgeon: Annice Needy, MD;  Location: [facility] INVASIVE CV LAB;  Service: Cardiovascular;  Laterality: Right;  . TUBAL LIGATION       reports that she quit smoking about 4 years ago. Her smoking use included cigarettes. She has a 9.50 pack-year smoking history. She uses smokeless tobacco. She reports that she does not drink alcohol and does not use drugs.  Allergies  Allergen Reactions  . Niacin And Related Nausea And Vomiting    No family history on file. Family history:  COPD,DMII, Heart failure, OSA , Cancer , Dementia,CVA Prior to Admission medications   Medication Sig Start Date End Date Taking? Authorizing Provider  ACCU-CHEK AVIVA PLUS test strip See admin instructions. 01/01/17   [provider]  aspirin EC 81 MG tablet Take 81 mg by mouth daily.    [provider]  buPROPion (WELLBUTRIN SR) 150 MG 12 hr tablet TAKE 1 TABLET BY MOUTH 3 TIMES A DAY FOR 3 DAYS THEN 1 TABLET TWICE A DAY AFTER 08/03/16   [provider]  BYDUREON 2 MG PEN INJECT SUBCUTANEOUSLY ONCE WEEKLY FOR HIGH BLOOD SUGAR 09/17/16   [provider]  carvedilol (COREG) 3.125 MG tablet Take 3.125 mg by  mouth 2 (two) times daily with a meal.     [provider]  clopidogrel (PLAVIX) 75 MG tablet Take 75 mg by mouth daily.    [provider]  enalapril (VASOTEC) 10 MG tablet 1 BY MOUTH DAILY FOR HIGH BLOOD PRESSURE 09/28/15   [provider]  fexofenadine (ALLEGRA) 180 MG tablet Take by mouth.    [provider]  furosemide (LASIX) 40 MG tablet Take 40 mg by mouth daily.    [provider]  gabapentin (NEURONTIN) 300 MG capsule Take 300 mg by mouth daily as needed (for nerve pain).     [provider]  glipiZIDE (GLUCOTROL) 10 MG tablet 1 BY MOUTH TWICE A DAY FOR DIABETES 09/17/16   [provider]  HUMULIN R U-500 KWIKPEN 500 UNIT/ML kwikpen INJECT 100 UNITS BEFORE BREAKFAST, 50 UNITS BEFORE SUPPER. 11/09/16   [provider]  ibuprofen (ADVIL,MOTRIN) 200 MG tablet Take 200 mg by mouth every 6 (six) hours as needed.    [provider]  JANUVIA 100 MG tablet 1 BY MOUTH DAILY FOR DIABETES 09/17/16   [provider]  metFORMIN (GLUCOPHAGE) 1000 MG tablet Take 1,000 mg by mouth 2 (two) times daily with a meal.    [provider]  methocarbamol (ROBAXIN) 500 MG tablet 1-2 BY MOUTH EVERY 6 HOURS AS NEEDED FOR MUSCLE SPASM 12/14/16   [provider]  naproxen (NAPROSYN) 500 MG tablet 1 BY MOUTH TWICE DAILY FOR 10 DAYS THEN AS NEEDED FOR PAIN 12/14/16   [provider]  oxyCODONE-acetaminophen (PERCOCET/ROXICET) 5-325 MG tablet Take 1 tablet by mouth every 6 (six) hours as needed. for pain 11/15/16   [provider]  pantoprazole (PROTONIX) 40 MG tablet Take 40 mg by mouth daily.    [provider]  SANTYL ointment OINTMENT TOPICAL AS DIRECTED 10/31/16   [provider]  sertraline (ZOLOFT) 100 MG tablet Take 150 mg by mouth daily.     [provider]  simvastatin (ZOCOR) 40 MG tablet Take 40 mg by mouth daily.     [provider]  traMADol (ULTRAM) 50  MG tablet Take 1 tablet (50 mg total) by mouth every 6 (six) hours as needed. 08/02/16   Cuthriell, Delorise RoyalsJonathan D, PA-C  traZODone (DESYREL) 50 MG tablet Take 50 mg by mouth at bedtime.    [provider]    Physical Exam: Vitals:   09/23/20 1115 09/23/20 1130 09/23/20 1145 09/23/20 1200  BP: 110/79 122/75  102/77  Pulse:    (!) 116  Resp:  (!) 27 (!) 30 (!) 22  Temp:      TempSrc:      SpO2:    95%  Weight:      Height:        Constitutional: NAD, calm, comfortable Vitals:   09/23/20 1115 09/23/20 1130 09/23/20 1145 09/23/20 1200  BP: 110/79 122/75  102/77  Pulse:    (!) 116  Resp:  (!) 27 (!) 30 (!) 22  Temp:      TempSrc:      SpO2:    95%  Weight:  Height:       Eyes: PERRL, lids and conjunctivae normal ENMT: Mucous membranes are moist. Posterior pharynx clear of any exudate or lesions.Normal dentition.  Neck: normal, supple, no masses, no thyromegaly Respiratory: clear to auscultation bilaterally, no wheezing, no crackles. Normal respiratory effort. No accessory muscle use.  Cardiovascular:tachycardic, no murmurs / rubs / gallops. No extremity edema. 2+ pedal pulses. No carotid bruits.  Abdomen: no tenderness, no masses palpated. No hepatosplenomegaly. Bowel sounds positive.  EXT: no clubbing / cyanosis. S/p R BKA, Good ROM. Normal muscle tone.  Left foot with noted redness streaking up leg to right below knee, area is noted to be tender to touch as well.  Left foot with noted ulcer on lateral aspect of foot with drainage note on bandage, no drainage from ulcer expressed.  Skin: cellulitis left foot,  Noted ulcer lateral left foot. No induration Neurologic: CN 2-12 grossly intact. Sensation intact, DTR normal. Strength 5/5 in all 4.  Psychiatric: Normal judgment and insight. Alert and oriented x 3. Normal mood.    Labs on Admission: I have personally reviewed following labs and imaging studies  CBC: Recent Labs  Lab 09/23/20 0707  WBC 17.1*  NEUTROABS  15.6*  HGB 11.3*  HCT 35.6*  MCV 83.4  PLT 256   Basic Metabolic Panel: Recent Labs  Lab 09/23/20 0707  NA 133*  K 4.0  CL 99  CO2 24  GLUCOSE 350*  BUN 16  CREATININE 0.88  CALCIUM 8.9   GFR: Estimated Creatinine Clearance: 85.9 mL/min (by C-G formula based on SCr of 0.88 mg/dL). Liver Function Tests: Recent Labs  Lab 09/23/20 0707  AST 15  ALT 19  ALKPHOS 103  BILITOT 0.9  PROT 7.0  ALBUMIN 3.1*   No results for input(s): LIPASE, AMYLASE in the last 168 hours. No results for input(s): AMMONIA in the last 168 hours. Coagulation Profile: Recent Labs  Lab 09/23/20 0707  INR 1.1   Cardiac Enzymes: No results for input(s): CKTOTAL, CKMB, CKMBINDEX, TROPONINI in the last 168 hours. BNP (last 3 results) No results for input(s): PROBNP in the last 8760 hours. HbA1C: No results for input(s): HGBA1C in the last 72 hours. CBG: No results for input(s): GLUCAP in the last 168 hours. Lipid Profile: No results for input(s): CHOL, HDL, LDLCALC, TRIG, CHOLHDL, LDLDIRECT in the last 72 hours. Thyroid Function Tests: No results for input(s): TSH, T4TOTAL, FREET4, T3FREE, THYROIDAB in the last 72 hours. Anemia Panel: No results for input(s): VITAMINB12, FOLATE, FERRITIN, TIBC, IRON, RETICCTPCT in the last 72 hours. Urine analysis:    Component Value Date/Time   COLORURINE Straw 01/24/2014 1456   APPEARANCEUR Clear 01/24/2014 1456   LABSPEC 1.006 01/24/2014 1456   PHURINE 5.0 01/24/2014 1456   GLUCOSEU Negative 01/24/2014 1456   HGBUR Negative 01/24/2014 1456   BILIRUBINUR Negative 01/24/2014 1456   KETONESUR Negative 01/24/2014 1456   PROTEINUR Negative 01/24/2014 1456   NITRITE Negative 01/24/2014 1456   LEUKOCYTESUR Negative 01/24/2014 1456    Radiological Exams on Admission: DG Chest 2 View  Result Date: 09/23/2020 CLINICAL DATA:  Sepsis EXAM: CHEST - 2 VIEW COMPARISON:  October 06, 2008 FINDINGS: Lungs are clear. Heart is upper normal in size with pulmonary  vascularity normal. No adenopathy. There is mild degenerative change in the thoracic spine. IMPRESSION: No edema or airspace opacity.  Heart upper normal in size. Electronically Signed   By: Bretta Bang III M.D.   On: 09/23/2020 08:12   CT ABDOMEN PELVIS W CONTRAST  Result Date: 09/23/2020 CLINICAL DATA:  Abdominal pain EXAM: CT ABDOMEN AND PELVIS WITH CONTRAST TECHNIQUE: Multidetector CT imaging of the abdomen and pelvis was performed using the standard protocol following bolus administration of intravenous contrast. CONTRAST:  OMNIPAQUE IOHEXOL 300 MG/ML  SOLN COMPARISON:  CT chest 2005 FINDINGS: Lower chest: Bibasilar atelectasis. Hepatobiliary: No focal liver abnormality is seen. No gallstones, gallbladder wall thickening, or biliary dilatation. Pancreas: Unremarkable. Spleen: Unremarkable. Adrenals/Urinary Tract: There is a 3.4 x 2.8 cm lesion of the right adrenal, which was also present on a 2005 chest CT and is therefore likely benign. Two 3 mm nonobstructing calculi of the lower pole of the right kidney. Small cyst of the interpolar right kidney. Partially distended bladder is unremarkable. Stomach/Bowel: Stomach is within normal limits. Bowel is normal in caliber. Normal appendix. Vascular/Lymphatic: Aortoiliac atherosclerosis. No enlarged lymph nodes identified. There are nonspecific small para-aortic retroperitoneal nodes with adjacent fat infiltration. Reproductive: Uterus and bilateral adnexa are unremarkable. Other: No ascites.  No acute abnormality of the abdominal wall. Musculoskeletal: Advanced degenerative changes of the included spine. No acute osseous abnormality. IMPRESSION: No acute abnormality. Small nonobstructing right renal calculi. Aortic atherosclerosis. Electronically Signed   By: Guadlupe Spanish M.D.   On: 09/23/2020 09:39   DG Foot 2 Views Left  Result Date: 09/23/2020 CLINICAL DATA:  Soft tissue wound with redness EXAM: LEFT FOOT - 2 VIEW COMPARISON:  None.  FINDINGS: Frontal and lateral views were obtained. There is generalized soft tissue swelling. No soft tissue air. No fracture or dislocation. There is spurring in the dorsal midfoot region. Other joint spaces appear unremarkable. No erosive change or bony destruction. There are prominent posterior and inferior calcaneal spurs. There is calcification in much of the posterior plantar fascia. IMPRESSION: Soft tissue swelling without soft tissue air. No erosion or bony destruction. Spurring dorsal midfoot. No fracture or dislocation. Prominent calcaneal spurs. There is plantar fascia calcification. Electronically Signed   By: Bretta Bang III M.D.   On: 09/23/2020 08:14    EKG: Independently reviewed. 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 -Olanzapine 2.5mg  daily    DMII -last a1c 9.5, -will place on iss/resume home regimen once med rec completed    OSA -Continue CPAP.  Mood Disorder -cont Cymbalta and Wellbutrin.  Morbid Obesity -BMI 49 at admission  -Weight loss  counseling at discharge    DVT prophylaxis: lovenox  Code Status: FULL Family Communication: N/a Disposition Plan:  Patient is expected to be admitted greater than 2 midnights  Consults called:  Infectious disease Ravishankar Admission status:inpatient   Lurline Del MD [facility] If 7PM-7AM, please contact night-coverage [email or web address] Password [facility]  09/23/2020, 12:50 PM

## 2020-09-23 NOTE — ED Triage Notes (Signed)
EMS brought pt in from home; recently d/c from [facility] for foot ulcer; hx rt BKA, w/c bound; d/c with antibiotics but did not finish; N/V since midnight; pt transferred to stretcher, generalized weakness and unable to assist for moving

## 2020-09-23 NOTE — ED Notes (Signed)
Messaged MD Maisie Fus regarding insulin orders at this time

## 2020-09-23 NOTE — ED Notes (Signed)
Per lab, will add on aPTT to previously drawn labs

## 2020-09-23 NOTE — Progress Notes (Signed)
Sepsis protocol being followed by [facility] 

## 2020-09-23 NOTE — Consult Note (Signed)
PHARMACY -  BRIEF ANTIBIOTIC NOTE   Pharmacy has received consult(s) for Vancomycin and Cefepime from an ED provider.  The patient's profile has been reviewed for ht/wt/allergies/indication/available labs.    One time order(s) placed for:  Cefepime 2 gram x 1   Additional vancomycin 1500 mg to be given with 1000 mg dose for total vancomycin loading dose of 2500 mg   Further antibiotics/pharmacy consults should be ordered by admitting physician if indicated.                       Thank you, Sharen Hones, PharmD, BCPS Clinical Pharmacist  09/23/2020  8:34 AM

## 2020-09-23 NOTE — H&P (View-Only) (Signed)
[facility] Vascular Consult Note  MRN : [id number]  [name] is a 59 y.o. (10-19-1960) female who presents with chief complaint of  Chief Complaint  Patient presents with  . Emesis  .  History of Present Illness: I am asked to see the patient by Dr. Maisie Fus and Dr. Excell Seltzer for evaluation of a nonhealing ulceration on the left foot.  The patient has previously seen me but it appears that the last time was about 3-1/2 years ago.  I performed a carotid endarterectomy on her several years before that and a right leg intervention 4 years ago.  She went on to get treatment at [facility] and ultimately lost her right leg.  She was in [facility] recently and was also told that she had recurrent carotid disease.  No recent focal neurologic symptoms.  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  No fevers or chills or signs of systemic infection.  No real pain in the area to speak of.  Given her nonhealing ulceration with previous history of PAD, we are consulted for further evaluation and treatment  Current Facility-Administered Medications  Medication Dose Route Frequency Provider Last Rate Last Admin  . 0.9 %  sodium chloride infusion   Intravenous Continuous Lurline Del, MD   Held at 09/23/20 1418  . albuterol (PROVENTIL) (2.5 MG/3ML) 0.083% nebulizer solution 2.5 mg  2.5 mg Nebulization Q2H PRN Skip Mayer A, MD      . ceFEPIme (MAXIPIME) 2 g in sodium chloride 0.9 % 100 mL IVPB  2 g Intravenous Q8H Foye Deer, RPH      . enoxaparin (LOVENOX) injection 60 mg  0.5 mg/kg Subcutaneous Q24H Skip Mayer A, MD      . HYDROcodone-acetaminophen (NORCO/VICODIN) 5-325 MG per tablet 1-2 tablet  1-2 tablet Oral Q4H PRN Lurline Del, MD   1 tablet at 09/23/20 1412  . ibuprofen (ADVIL) tablet 400 mg  400 mg Oral Q6H PRN Lurline Del, MD      . insulin aspart (novoLOG) injection 0-20 Units  0-20 Units Subcutaneous TID WC Skip Mayer A,  MD      . insulin aspart (novoLOG) injection 0-5 Units  0-5 Units Subcutaneous QHS Skip Mayer A, MD      . insulin aspart (novoLOG) injection 15 Units  15 Units Subcutaneous TID WC Skip Mayer A, MD      . insulin regular human CONCENTRATED (HUMULIN R) 500 UNIT/ML kwikpen 50 Units  50 Units Subcutaneous Q supper Lurline Del, MD      . Melene Muller ON 09/24/2020] insulin regular human CONCENTRATED (HUMULIN R) 500 UNIT/ML kwikpen 70 Units  70 Units Subcutaneous Q breakfast Skip Mayer A, MD      . ondansetron [facility]) tablet 4 mg  4 mg Oral Q6H PRN Lurline Del, MD       Or  . ondansetron [facility]) injection 4 mg  4 mg Intravenous Q6H PRN Lurline Del, MD      . vancomycin (VANCOREADY) IVPB 750 mg/150 mL  750 mg Intravenous Q12H Foye Deer, [facility]       Current Outpatient Medications  Medication Sig Dispense Refill  . atorvastatin (LIPITOR) 40 MG tablet Take 40 mg by mouth daily.    Marland Kitchen buPROPion (WELLBUTRIN XL) 150 MG 24 hr tablet Take 150 mg by mouth daily. (take with 300mg  tablet to equal 450mg  total)    . buPROPion (WELLBUTRIN XL) 300 MG 24 hr tablet  Take 300 mg by mouth daily. (take with 150mg  tablet to equal 450mg  total)    . cetirizine (ZYRTEC) 10 MG tablet Take 10 mg by mouth daily.    . clopidogrel (PLAVIX) 75 MG tablet Take 75 mg by mouth daily.    . Dulaglutide (TRULICITY) 0.75 MG/0.5ML SOPN Inject 0.75 mg into the skin every Tuesday.    . DULoxetine (CYMBALTA) 60 MG capsule Take 60 mg by mouth daily.    . enalapril (VASOTEC) 5 MG tablet Take 5 mg by mouth daily.    gabapentin (NEURONTIN) 300 MG capsule Take 300-600 mg by mouth at bedtime.    Wednesday HUMULIN R U-500 KWIKPEN 500 UNIT/ML kwikpen Inject 50-70 Units into the skin See admin instructions. Inject 70u under the skin every morning before breakfast and inject 50u under the skin every evening before supper  3  . metFORMIN (GLUCOPHAGE-XR) 500 MG 24 hr tablet Take 500 mg by mouth daily with supper.     Marland Kitchen oxybutynin (DITROPAN-XL) 10 MG 24 hr tablet Take 10 mg by mouth daily.    . pantoprazole (PROTONIX) 40 MG tablet Take 40 mg by mouth daily.    . traZODone (DESYREL) 50 MG tablet Take 100 mg by mouth at bedtime.      Past Medical History:  Diagnosis Date  . Anemia   . Anxiety   . Arthritis   . CHF (congestive heart failure) (HCC)   . COPD (chronic obstructive pulmonary disease) (HCC)   . Coronary artery disease   . Diabetes mellitus without complication (HCC)   . Fibromyalgia   . GERD (gastroesophageal reflux disease)   . Hypertension   . Peripheral vascular disease (HCC)   . Sleep apnea   . Stroke [facility])     Past Surgical History:  Procedure Laterality Date  . CESAREAN SECTION    . CORONARY ANGIOPLASTY WITH STENT PLACEMENT Left   . DILATION AND CURETTAGE OF UTERUS    . ENDARTERECTOMY Left 01/29/2015   Procedure: ENDARTERECTOMY CAROTID;  Surgeon: [facility], MD;  Location: [facility] ORS;  Service: Vascular;  Laterality: Left;  . PERIPHERAL VASCULAR CATHETERIZATION Right 05/27/2015   Procedure: Lower Extremity Angiography;  Surgeon: Annice Needy, MD;  Location: [facility] INVASIVE CV LAB;  Service: Cardiovascular;  Laterality: Right;  . PERIPHERAL VASCULAR CATHETERIZATION  05/27/2015   Procedure: Lower Extremity Intervention;  Surgeon: Annice Needy, MD;  Location: [facility] INVASIVE CV LAB;  Service: Cardiovascular;;  . PERIPHERAL VASCULAR CATHETERIZATION Right 09/22/2016   Procedure: Lower Extremity Angiography;  Surgeon: Annice Needy, MD;  Location: [facility] INVASIVE CV LAB;  Service: Cardiovascular;  Laterality: Right;  . TUBAL LIGATION       Social History   Tobacco Use  . Smoking status: Former Smoker    Packs/day: 0.25    Years: 38.00    Pack years: 9.50    Types: Cigarettes    Quit date: 09/12/2016    Years since quitting: 4.0  . Smokeless tobacco: Current User  Substance Use Topics  . Alcohol use: No  . Drug use: No    Family History No bleeding disorders, clotting disorders,  autoimmune diseases, or aneurysms  Allergies  Allergen Reactions  . Niacin And Related Nausea And Vomiting     REVIEW OF SYSTEMS (Negative unless checked)  Constitutional: [] Weight loss  [] Fever  [] Chills Cardiac: [] Chest pain   [] Chest pressure   [] Palpitations   [] Shortness of breath when laying flat   [] Shortness of breath at rest   [] Shortness  of breath with exertion. Vascular:  [] Pain in legs with walking   [] Pain in legs at rest   [] Pain in legs when laying flat   [] Claudication   [] Pain in feet when walking  [] Pain in feet at rest  [] Pain in feet when laying flat   [] History of DVT   [] Phlebitis   [] Swelling in legs   [] Varicose veins   [x] Non-healing ulcers Pulmonary:   [] Uses home oxygen   [] Productive cough   [] Hemoptysis   [] Wheeze  [x] COPD   [] Asthma Neurologic:  [] Dizziness  [] Blackouts   [] Seizures   [] History of stroke   [] History of TIA  [] Aphasia   [] Temporary blindness   [] Dysphagia   [] Weakness or numbness in arms   [] Weakness or numbness in legs Musculoskeletal:  [x] Arthritis   [] Joint swelling   [x] Joint pain   [] Low back pain Hematologic:  [] Easy bruising  [] Easy bleeding   [] Hypercoagulable state   [x] Anemic  [] Hepatitis Gastrointestinal:  [] Blood in stool   [] Vomiting blood  [] Gastroesophageal reflux/heartburn   [] Difficulty swallowing. Genitourinary:  [] Chronic kidney disease   [] Difficult urination  [] Frequent urination  [] Burning with urination   [] Blood in urine Skin:  [] Rashes   [x] Ulcers   [x] Wounds Psychological:  [x] History of anxiety   []  History of major depression.  Physical Examination  Vitals:   09/23/20 1200 09/23/20 1304 09/23/20 1400 09/23/20 1527  BP: 102/77 (!) 105/94 130/68   Pulse: (!) 116 (!) 115 100   Resp: (!) 22 (!) 21 14   Temp:    (!) 101.4 F (38.6 C)  TempSrc:      SpO2: 95% 95% 96%   Weight:      Height:       Body mass index is 44.63 kg/m. Gen:  WD/WN, NAD.  Morbidly obese Head: [place]/AT, No temporalis wasting.   Ear/Nose/Throat: Hearing grossly intact, nares w/o erythema or drainage, oropharynx w/o Erythema/Exudate Eyes: Sclera non-icteric, conjunctiva clear Neck: Trachea midline.  No JVD.  Pulmonary:  Good air movement, respirations not labored, equal bilaterally.  Cardiac: Tachycardic Vascular:  Vessel Right Left  Radial Palpable Palpable                          PT  not palpable  trace palpable  DP  not palpable  1+ palpable   Gastrointestinal: soft, non-tender/non-distended. No guarding/reflex.  Musculoskeletal: M/S 5/5 throughout.  Extremities without ischemic changes.  Right BKA well-healed. Neurologic: Sensation grossly intact in extremities.  Symmetrical.  Speech is fluent. Motor exam as listed above. Psychiatric: Judgment intact, Mood & affect appropriate for pt's clinical situation. Dermatologic: Relatively clean, superficial ulceration on the left lateral lower foot just below the fifth metatarsal head area.  No significant surrounding erythema.  No drainage.      CBC Lab Results  Component Value Date   WBC 17.1 (H) 09/23/2020   HGB 11.3 (L) 09/23/2020   HCT 35.6 (L) 09/23/2020   MCV 83.4 09/23/2020   PLT 256 09/23/2020    BMET    Component Value Date/Time   NA 133 (L) 09/23/2020 0707   NA 137 01/22/2015 1146   K 4.0 09/23/2020 0707   K 4.2 01/22/2015 1146   CL 99 09/23/2020 0707   CL 101 01/22/2015 1146   CO2 24 09/23/2020 0707   CO2 29 01/22/2015 1146   GLUCOSE 350 (H) 09/23/2020 0707   GLUCOSE 171 (H) 01/22/2015 1146   BUN 16 09/23/2020 0707   BUN 12  01/22/2015 1146   CREATININE 0.88 09/23/2020 0707   CREATININE 0.81 01/22/2015 1146   CALCIUM 8.9 09/23/2020 0707   CALCIUM 9.2 01/22/2015 1146   GFRNONAA >60 09/23/2020 0707   GFRNONAA >60 01/22/2015 1146   GFRAA >60 09/20/2016 1431   GFRAA >60 01/22/2015 1146   Estimated Creatinine Clearance: 85.9 mL/min (by C-G formula based on SCr of 0.88 mg/dL).  COAG Lab Results  Component Value Date   INR  1.1 09/23/2020   INR 1.1 01/22/2015   INR 1.1 08/08/2014    Radiology DG Chest 2 View  Result Date: 09/23/2020 CLINICAL DATA:  Sepsis EXAM: CHEST - 2 VIEW COMPARISON:  October 06, 2008 FINDINGS: Lungs are clear. Heart is upper normal in size with pulmonary vascularity normal. No adenopathy. There is mild degenerative change in the thoracic spine. IMPRESSION: No edema or airspace opacity.  Heart upper normal in size. Electronically Signed   By: Bretta Bang III M.D.   On: 09/23/2020 08:12   CT ABDOMEN PELVIS W CONTRAST  Result Date: 09/23/2020 CLINICAL DATA:  Abdominal pain EXAM: CT ABDOMEN AND PELVIS WITH CONTRAST TECHNIQUE: Multidetector CT imaging of the abdomen and pelvis was performed using the standard protocol following bolus administration of intravenous contrast. CONTRAST:  OMNIPAQUE IOHEXOL 300 MG/ML  SOLN COMPARISON:  CT chest 2005 FINDINGS: Lower chest: Bibasilar atelectasis. Hepatobiliary: No focal liver abnormality is seen. No gallstones, gallbladder wall thickening, or biliary dilatation. Pancreas: Unremarkable. Spleen: Unremarkable. Adrenals/Urinary Tract: There is a 3.4 x 2.8 cm lesion of the right adrenal, which was also present on a 2005 chest CT and is therefore likely benign. Two 3 mm nonobstructing calculi of the lower pole of the right kidney. Small cyst of the interpolar right kidney. Partially distended bladder is unremarkable. Stomach/Bowel: Stomach is within normal limits. Bowel is normal in caliber. Normal appendix. Vascular/Lymphatic: Aortoiliac atherosclerosis. No enlarged lymph nodes identified. There are nonspecific small para-aortic retroperitoneal nodes with adjacent fat infiltration. Reproductive: Uterus and bilateral adnexa are unremarkable. Other: No ascites.  No acute abnormality of the abdominal wall. Musculoskeletal: Advanced degenerative changes of the included spine. No acute osseous abnormality. IMPRESSION: No acute abnormality. Small nonobstructing  right renal calculi. Aortic atherosclerosis. Electronically Signed   By: Guadlupe Spanish M.D.   On: 09/23/2020 09:39   DG Foot 2 Views Left  Result Date: 09/23/2020 CLINICAL DATA:  Soft tissue wound with redness EXAM: LEFT FOOT - 2 VIEW COMPARISON:  None. FINDINGS: Frontal and lateral views were obtained. There is generalized soft tissue swelling. No soft tissue air. No fracture or dislocation. There is spurring in the dorsal midfoot region. Other joint spaces appear unremarkable. No erosive change or bony destruction. There are prominent posterior and inferior calcaneal spurs. There is calcification in much of the posterior plantar fascia. IMPRESSION: Soft tissue swelling without soft tissue air. No erosion or bony destruction. Spurring dorsal midfoot. No fracture or dislocation. Prominent calcaneal spurs. There is plantar fascia calcification. Electronically Signed   By: Bretta Bang III M.D.   On: 09/23/2020 08:14      Assessment/Plan 1.  PAD with ulceration left lower extremity.  Has already lost her right leg for similar reasons 3 to 4 years ago.  Had intervention on the right leg 4 years ago but ultimately lost the leg due to continued infection.  Although her ulceration is shallow, given her multiple other comorbidities she is still quite high risk of limb loss.  I would recommend a left lower extremity angiogram and we will  plan on doing this tomorrow.  I discussed the risks and benefits of the procedure.  She is agreeable to proceed. 2.  Carotid artery stenosis.  Over 5 years status post right carotid endarterectomy.  Was told there was recurrent stenosis at [facility].  Her lower extremity situation takes precedence now, but this will need to be evaluated.  We can do a carotid duplex while she is in the hospital or as an outpatient when she is discharged. 3.  Hypertension.  Stable on outpatient medications and blood pressure control important in reducing the progression of atherosclerotic  disease. On appropriate oral medications. 4.  Diabetes. blood glucose control important in reducing the progression of atherosclerotic disease. Also, involved in wound healing. On appropriate medications.    Festus Barren, MD  09/23/2020 4:26 PM    This note was created with Dragon medical transcription system.  Any error is purely unintentional

## 2020-09-23 NOTE — ED Notes (Signed)
Entered room to deliver pt's meal tray. Pt's IV out and pt bleeding at this time. Pt cleaned up and clean gown placed. Fluids hooked to other IV. Pt eating at this time.

## 2020-09-23 NOTE — ED Notes (Signed)
Pt given meal tray.

## 2020-09-23 NOTE — Progress Notes (Signed)
CODE SEPSIS - PHARMACY COMMUNICATION  **Broad Spectrum Antibiotics should be administered within 1 hour of Sepsis diagnosis**  Time Code Sepsis Called/Page Received: 0829  Antibiotics Ordered: Cefepime, vancomycin, metronidazole  Time of 1st antibiotic administration: 0853  Additional action taken by pharmacy: N/A  Tressie Ellis 09/23/2020  9:19 AM

## 2020-09-23 NOTE — Progress Notes (Signed)
Pharmacy Antibiotic Note  [name] is a 59 y.o. female admitted on 09/23/2020 with sepsis, cellulitis and diabetic foot infection.  Pharmacy has been consulted for Vancomycin and Cefepime dosing.  Plan: Patient received Vancomycin 1000mg  IV and 1500mg  IV in the ED for a total loading dose of 2500mg . Will order Vancomycin 750mg  IV q12h to follow.  Cefepime 2g IV q8h  Height: 5\' 4"  (162.6 cm) Weight: 117.9 kg (260 lb) IBW/kg (Calculated) : 54.7  Temp (24hrs), Avg:102 F (38.9 C), Min:101.4 F (38.6 C), Max:102.5 F (39.2 C)  Recent Labs  Lab 09/23/20 0707 09/23/20 1127  WBC 17.1*  --   CREATININE 0.88  --   LATICACIDVEN 1.8 1.6    Estimated Creatinine Clearance: 85.9 mL/min (by C-G formula based on SCr of 0.88 mg/dL).    Allergies  Allergen Reactions  . Niacin And Related Nausea And Vomiting    Antimicrobials this admission: Cefepime 12/29 >>  Vancomcyin 12/29 >>   Dose adjustments this admission:  Microbiology results:  Thank you for allowing pharmacy to be a part of this patient's care.  , PharmD, BCPS 09/23/2020 4:11 PM

## 2020-09-23 NOTE — Progress Notes (Signed)
PHARMACY - PHYSICIAN COMMUNICATION CRITICAL VALUE ALERT - BLOOD CULTURE IDENTIFICATION (BCID)  [name] is an 59 y.o. female who presented to [facility] on 09/23/2020 with a chief complaint of non healing ulcer/sepsis.   Assessment:  Strep species growing in 1 of 4  botttles (anaerobic) , no resistance detected.  (include suspected source if known)  Name of physician (or Provider) Contacted: Lindajo Royal   Current antibiotics: Vanc and Cefepime   Changes to prescribed antibiotics recommended:  Will continue pt on current regimen.  BCID results may be contaminant.   Results for orders placed or performed during the hospital encounter of 09/23/20  Blood Culture ID Panel (Reflexed) (Collected: 09/23/2020  7:11 AM)  Result Value Ref Range   Enterococcus faecalis NOT DETECTED NOT DETECTED   Enterococcus Faecium NOT DETECTED NOT DETECTED   Listeria monocytogenes NOT DETECTED NOT DETECTED   Staphylococcus species NOT DETECTED NOT DETECTED   Staphylococcus aureus (BCID) NOT DETECTED NOT DETECTED   Staphylococcus epidermidis NOT DETECTED NOT DETECTED   Staphylococcus lugdunensis NOT DETECTED NOT DETECTED   Streptococcus species DETECTED (A) NOT DETECTED   Streptococcus agalactiae NOT DETECTED NOT DETECTED   Streptococcus pneumoniae NOT DETECTED NOT DETECTED   Streptococcus pyogenes NOT DETECTED NOT DETECTED   A.calcoaceticus-baumannii NOT DETECTED NOT DETECTED   Bacteroides fragilis NOT DETECTED NOT DETECTED   Enterobacterales NOT DETECTED NOT DETECTED   Enterobacter cloacae complex NOT DETECTED NOT DETECTED   Escherichia coli NOT DETECTED NOT DETECTED   Klebsiella aerogenes NOT DETECTED NOT DETECTED   Klebsiella oxytoca NOT DETECTED NOT DETECTED   Klebsiella pneumoniae NOT DETECTED NOT DETECTED   Proteus species NOT DETECTED NOT DETECTED   Salmonella species NOT DETECTED NOT DETECTED   Serratia marcescens NOT DETECTED NOT DETECTED   Haemophilus influenzae NOT DETECTED NOT  DETECTED   Neisseria meningitidis NOT DETECTED NOT DETECTED   Pseudomonas aeruginosa NOT DETECTED NOT DETECTED   Stenotrophomonas maltophilia NOT DETECTED NOT DETECTED   Candida albicans NOT DETECTED NOT DETECTED   Candida auris NOT DETECTED NOT DETECTED   Candida glabrata NOT DETECTED NOT DETECTED   Candida krusei NOT DETECTED NOT DETECTED   Candida parapsilosis NOT DETECTED NOT DETECTED   Candida tropicalis NOT DETECTED NOT DETECTED   Cryptococcus neoformans/gattii NOT DETECTED NOT DETECTED    [name] D 09/23/2020  9:34 PM

## 2020-09-23 NOTE — ED Notes (Signed)
MD Maisie Fus made aware of critical troponin at this time

## 2020-09-23 NOTE — Consult Note (Signed)
NAME: [name]  DOB: Dec 29, 1960  MRN: [id number]  Date/Time: 09/23/2020 10:58 PM  REQUESTING PROVIDER: Dr. Maisie Fus Subjective:  REASON FOR CONSULT: Left foot wound ? [name] is a 59 y.o. pia with a history of diabetes mellitus, CHF, COPD, CAD, peripheral vascular disease, hypertension, sleep apnea, right below-knee amputation presented with chills, some confusion and a nonhealing wound on the left foot. Patient was recently at [facility] between August 29, 2020 until September 05, 2020 for the left foot wound which started a few weeks before that presentation after she accidentally hit her left foot on the wheelchair. She has involuntary movements of the left leg.  She is here to see a neurologist for that.  Patient says she suddenly started having chills and also nausea vomiting.  She was having pain in the foot as well.  Her daughter said that she was a little confused and out of it. Patient thinks that it is due to her gabapentin medication. In the ED blood pressure 140/79, temperature 102.5, heart rate of 127, pulse ox 100%. Labs revealed sodium of 133, potassium 4.0, glucose of 350, creatinine of 0.88, albumin of 3.1, lactate of 1.8, WBC of 17.1, hemoglobin of 11.3, and platelet 256.  UA showed proteinuria of more than 300.  0-5 WBC. Blood culture was sent.  And patient was started on vancomycin and cefepime I am asked to see the patient for the left foot wound  Past Medical History:  Diagnosis Date  . Anemia   . Anxiety   . Arthritis   . CHF (congestive heart failure) (HCC)   . COPD (chronic obstructive pulmonary disease) (HCC)   . Coronary artery disease   . Diabetes mellitus without complication (HCC)   . Fibromyalgia   . GERD (gastroesophageal reflux disease)   . Hypertension   . Peripheral vascular disease (HCC)   . Sleep apnea   . Stroke [facility])     Past Surgical History:  Procedure Laterality Date  . CESAREAN SECTION    . CORONARY ANGIOPLASTY WITH STENT PLACEMENT Left    . DILATION AND CURETTAGE OF UTERUS    . ENDARTERECTOMY Left 01/29/2015   Procedure: ENDARTERECTOMY CAROTID;  Surgeon: Annice Needy, MD;  Location: [facility] ORS;  Service: Vascular;  Laterality: Left;  . PERIPHERAL VASCULAR CATHETERIZATION Right 05/27/2015   Procedure: Lower Extremity Angiography;  Surgeon: Annice Needy, MD;  Location: [facility] INVASIVE CV LAB;  Service: Cardiovascular;  Laterality: Right;  . PERIPHERAL VASCULAR CATHETERIZATION  05/27/2015   Procedure: Lower Extremity Intervention;  Surgeon: Annice Needy, MD;  Location: [facility] INVASIVE CV LAB;  Service: Cardiovascular;;  . PERIPHERAL VASCULAR CATHETERIZATION Right 09/22/2016   Procedure: Lower Extremity Angiography;  Surgeon: Annice Needy, MD;  Location: [facility] INVASIVE CV LAB;  Service: Cardiovascular;  Laterality: Right;  . TUBAL LIGATION      Social History   Socioeconomic History  . Marital status: Divorced    Spouse name: Not on file  . Number of children: Not on file  . Years of education: Not on file  . Highest education level: Not on file  Occupational History  . Not on file  Tobacco Use  . Smoking status: Former Smoker    Packs/day: 0.25    Years: 38.00    Pack years: 9.50    Types: Cigarettes    Quit date: 09/12/2016    Years since quitting: 4.0  . Smokeless tobacco: Current User  Substance and Sexual Activity  . Alcohol use: No  .  Drug use: No  . Sexual activity: Not on file  Other Topics Concern  . Not on file  Social History Narrative  . Not on file   Social Determinants of Health   Financial Resource Strain: Not on file  Food Insecurity: Not on file  Transportation Needs: Not on file  Physical Activity: Not on file  Stress: Not on file  Social Connections: Not on file  Intimate Partner Violence: Not on file    No family history on file. Allergies  Allergen Reactions  . Niacin And Related Nausea And Vomiting   Family history High blood pressure father Sleep apnea father COPD Father Heart failure  father Arthritis Father Alzheimer's disease maternal grandmother Cancer maternal grandmother Cancer maternal grandfather ? Current Facility-Administered Medications  Medication Dose Route Frequency Provider Last Rate Last Admin  . 0.9 %  sodium chloride infusion   Intravenous Continuous Lurline Del, MD 100 mL/hr at 09/23/20 1628 New Bag at 09/23/20 1628  . 0.9 %  sodium chloride infusion   Intravenous Continuous Annice Needy, MD 20 mL/hr at 09/23/20 2227 New Bag at 09/23/20 2227  . albuterol (PROVENTIL) (2.5 MG/3ML) 0.083% nebulizer solution 2.5 mg  2.5 mg Nebulization Q2H PRN Skip Mayer A, MD      . ceFAZolin (ANCEF) IVPB 2g/100 mL premix  2 g Intravenous 30 min Pre-Op Annice Needy, MD      . ceFEPIme (MAXIPIME) 2 g in sodium chloride 0.9 % 100 mL IVPB  2 g Intravenous Q8H Foye Deer, [facility]   Stopped at 09/23/20 1711  . collagenase (SANTYL) ointment   Topical Daily Rosetta Posner, DPM      . enoxaparin (LOVENOX) injection 60 mg  0.5 mg/kg Subcutaneous Q24H Skip Mayer A, MD      . gadobutrol (GADAVIST) 1 MMOL/ML injection 10 mL  10 mL Intravenous Once PRN Lurline Del, MD      . HYDROcodone-acetaminophen (NORCO/VICODIN) 5-325 MG per tablet 1-2 tablet  1-2 tablet Oral Q4H PRN Lurline Del, MD   2 tablet at 09/23/20 2200  . ibuprofen (ADVIL) tablet 400 mg  400 mg Oral Q6H PRN Lurline Del, MD   400 mg at 09/23/20 1656  . insulin aspart (novoLOG) injection 0-20 Units  0-20 Units Subcutaneous TID WC Lurline Del, MD   15 Units at 09/23/20 1711  . insulin aspart (novoLOG) injection 0-5 Units  0-5 Units Subcutaneous QHS Skip Mayer A, MD      . insulin regular human CONCENTRATED (HUMULIN R) 500 UNIT/ML kwikpen 50 Units  50 Units Subcutaneous Q supper Lurline Del, MD      . Melene Muller ON 09/24/2020] insulin regular human CONCENTRATED (HUMULIN R) 500 UNIT/ML kwikpen 70 Units  70 Units Subcutaneous Q breakfast Skip Mayer A, MD      .  ondansetron [facility]) tablet 4 mg  4 mg Oral Q6H PRN Lurline Del, MD       Or  . ondansetron [facility]) injection 4 mg  4 mg Intravenous Q6H PRN Lurline Del, MD      . Melene Muller ON 09/24/2020] vancomycin (VANCOREADY) IVPB 750 mg/150 mL  750 mg Intravenous Q12H Foye Deer, [facility]       Current Outpatient Medications  Medication Sig Dispense Refill  . atorvastatin (LIPITOR) 40 MG tablet Take 40 mg by mouth daily.    Marland Kitchen buPROPion (WELLBUTRIN XL) 150 MG 24 hr tablet Take 150 mg by mouth daily. (take with 300mg  tablet to equal   total)    . buPROPion (WELLBUTRIN XL) 300 MG 24 hr tablet Take 300 mg by mouth daily. (take with  tablet to equal  total)    . cetirizine (ZYRTEC) 10 MG tablet Take 10 mg by mouth daily.    . clopidogrel (PLAVIX) 75 MG tablet Take 75 mg by mouth daily.    . Dulaglutide (TRULICITY) 0.75 MG/0.5ML SOPN Inject 0.75 mg into the skin every Tuesday.    . DULoxetine (CYMBALTA) 60 MG capsule Take 60 mg by mouth daily.    . enalapril (VASOTEC) 5 MG tablet Take 5 mg by mouth daily.    Marland Kitchen gabapentin (NEURONTIN) 300 MG capsule Take 300-600 mg by mouth at bedtime.    Marland Kitchen HUMULIN R U-500 KWIKPEN 500 UNIT/ML kwikpen Inject 50-70 Units into the skin See admin instructions. Inject 70u under the skin every morning before breakfast and inject 50u under the skin every evening before supper  3  . metFORMIN (GLUCOPHAGE-XR) 500 MG 24 hr tablet Take 500 mg by mouth daily with supper.    Marland Kitchen oxybutynin (DITROPAN-XL) 10 MG 24 hr tablet Take 10 mg by mouth daily.    . pantoprazole (PROTONIX) 40 MG tablet Take 40 mg by mouth daily.    . traZODone (DESYREL) 50 MG tablet Take 100 mg by mouth at bedtime.       Abtx:  Anti-infectives (From admission, onward)   Start     Dose/Rate Route Frequency Ordered Stop   09/24/20 0600  vancomycin (VANCOREADY) IVPB 750 mg/150 mL        750 mg 150 mL/hr over 60 Minutes Intravenous Every 12 hours 09/23/20 1606     09/23/20 2045  ceFAZolin  (ANCEF) IVPB 2g/100 mL premix        2 g 200 mL/hr over 30 Minutes Intravenous 30 min pre-op 09/23/20 2011     09/23/20 1615  ceFEPIme (MAXIPIME) 2 g in sodium chloride 0.9 % 100 mL IVPB        2 g 200 mL/hr over 30 Minutes Intravenous Every 8 hours 09/23/20 1604     09/23/20 0900  vancomycin (VANCOREADY) IVPB 1500 mg/300 mL        1,500 mg 150 mL/hr over 120 Minutes Intravenous  Once 09/23/20 0833 09/23/20 1339   09/23/20 0830  ceFEPIme (MAXIPIME) 2 g in sodium chloride 0.9 % 100 mL IVPB        2 g 200 mL/hr over 30 Minutes Intravenous  Once 09/23/20 0827 09/23/20 1117   09/23/20 0830  metroNIDAZOLE (FLAGYL) IVPB 500 mg        500 mg 100 mL/hr over 60 Minutes Intravenous  Once 09/23/20 0827 09/23/20 1117   09/23/20 0830  vancomycin (VANCOCIN) IVPB 1000 mg/200 mL premix        1,000 mg 200 mL/hr over 60 Minutes Intravenous  Once 09/23/20 0827 09/23/20 1305      REVIEW OF SYSTEMS:  Const: fever,  chills, negative weight loss Eyes: negative diplopia or visual changes, negative eye pain ENT: negative coryza, negative sore throat Resp: negative cough, hemoptysis, dyspnea Cards: negative for chest pain, palpitations, lower extremity edema GU: negative for frequency, dysuria and hematuria GI: Negative for abdominal pain, diarrhea, bleeding, constipation Skin: negative for rash and pruritus Heme: negative for easy bruising and gum/nose bleeding MS: Generalized weakness Neurolo: Some confusion and sleepiness , memory problems  Psych: negative for feelings of anxiety, depression  Endocrine: Poorly controlled diabetes mellitus Allergy/Immunology- negative for any medication or food allergies ? Pertinent  Positives include : Objective:  VITALS:  BP (!) 132/98 (BP Location: Left Arm)   Pulse 95   Temp (!) 101.4 F (38.6 C)   Resp (!) 22   Ht  (1.626 m)   Wt 117.9 kg   SpO2 95%   BMI 44.63 kg/m  PHYSICAL EXAM:  General: Alert, cooperative, no distress, appears stated age.   Very high BMI, pale Head: Normocephalic, without obvious abnormality, atraumatic. Eyes: Conjunctivae clear, anicteric sclerae. Pupils are equal ENT Nares normal. No drainage or sinus tenderness. Lips, mucosa, and tongue normal. No Thrush Edentulous Neck: Supple, symmetrical, no adenopathy, thyroid: non tender no carotid bruit and no JVD. Back: No CVA tenderness. Lungs: Clear to auscultation bilaterally. No Wheezing or Rhonchi. No rales. Heart: Regular rate and rhythm, no murmur, rub or gallop. Abdomen: Soft, non-tender,not distended. Bowel sounds normal. No masses Extremities: Left foot on the plantar surface of the lateral margin there is a superficial wound.  There is no surrounding erythema or swelling.   Right BKA Skin: No rashes or lesions. Or bruising Lymph: Cervical, supraclavicular normal. Neurologic: Grossly non-focal Pertinent Labs Lab Results CBC    Component Value Date/Time   WBC 17.1 (H) 09/23/2020 0707   RBC 4.27 09/23/2020 0707   HGB 11.3 (L) 09/23/2020 0707   HGB 10.1 (L) 01/22/2015 1146   HCT 35.6 (L) 09/23/2020 0707   HCT 31.9 (L) 01/22/2015 1146   PLT 256 09/23/2020 0707   PLT 191 01/22/2015 1146   MCV 83.4 09/23/2020 0707   MCV 77 (L) 01/22/2015 1146   MCH 26.5 09/23/2020 0707   MCHC 31.7 09/23/2020 0707   RDW 15.0 09/23/2020 0707   RDW 16.1 (H) 01/22/2015 1146   LYMPHSABS 0.4 (L) 09/23/2020 0707   MONOABS 0.9 09/23/2020 0707   EOSABS 0.1 09/23/2020 0707   BASOSABS 0.1 09/23/2020 0707    CMP Latest Ref Rng & Units 09/23/2020 08/15/2020 09/20/2016  Glucose 70 - 99 mg/dL 098(J) 74 -  BUN 6 - 20 mg/dL Creatinine 0.44 - 1.00 mg/dL 1.91 4.78(G) 9.56  Sodium 135 - 145 mmol/L 133(L) 137 -  Potassium 3.5 - 5.1 mmol/L 4.0 3.8 -  Chloride 98 - 111 mmol/L 99 102 -  CO2 22 - 32 mmol/L 24 25 -  Calcium 8.9 - 10.3 mg/dL 8.9 9.0 -  Total Protein 6.5 - 8.1 g/dL 7.0 - -  Total Bilirubin 0.3 - 1.2 mg/dL 0.9 - -  Alkaline Phos 38 - 126 U/L 103 - -   AST 15 - 41 U/L 15 - -  ALT 0 - 44 U/L 19 - -      Microbiology: Recent Results (from the past 240 hour(s))  Culture, blood (Routine x 2)     Status: None (Preliminary result)   Collection Time: 09/23/20  7:11 AM   Specimen: BLOOD  Result Value Ref Range Status   Specimen Description BLOOD  Final   Special Requests   Final    BOTTLES DRAWN AEROBIC AND ANAEROBIC Blood Culture adequate volume   Culture  Setup Time   Final    Organism ID to follow GRAM POSITIVE COCCI ANAEROBIC BOTTLE ONLY CRITICAL RESULT CALLED TO, READ BACK BY AND VERIFIED WITH: JASON ROBINS  ON 09/23/20 SKL Performed at [facility], [address], [place], [place] [address]    Culture [facility] POSITIVE COCCI  Final   Report Status PENDING  Incomplete  Blood Culture ID Panel (Reflexed)     Status: Abnormal  Collection Time: 09/23/20  7:11 AM  Result Value Ref Range Status   Enterococcus faecalis NOT DETECTED NOT DETECTED Final   Enterococcus Faecium NOT DETECTED NOT DETECTED Final   Listeria monocytogenes NOT DETECTED NOT DETECTED Final   Staphylococcus species NOT DETECTED NOT DETECTED Final   Staphylococcus aureus (BCID) NOT DETECTED NOT DETECTED Final   Staphylococcus epidermidis NOT DETECTED NOT DETECTED Final   Staphylococcus lugdunensis NOT DETECTED NOT DETECTED Final   Streptococcus species DETECTED (A) NOT DETECTED Final    Comment: Not Enterococcus species, Streptococcus agalactiae, Streptococcus pyogenes, or Streptococcus pneumoniae. CRITICAL RESULT CALLED TO, READ BACK BY AND VERIFIED WITH: JASON ROBINS @2059  ON 09/23/20 SKL    Streptococcus agalactiae NOT DETECTED NOT DETECTED Final   Streptococcus pneumoniae NOT DETECTED NOT DETECTED Final   Streptococcus pyogenes NOT DETECTED NOT DETECTED Final   A.calcoaceticus-baumannii NOT DETECTED NOT DETECTED Final   Bacteroides fragilis NOT DETECTED NOT DETECTED Final   Enterobacterales NOT DETECTED NOT DETECTED Final   Enterobacter  cloacae complex NOT DETECTED NOT DETECTED Final   Escherichia coli NOT DETECTED NOT DETECTED Final   Klebsiella aerogenes NOT DETECTED NOT DETECTED Final   Klebsiella oxytoca NOT DETECTED NOT DETECTED Final   Klebsiella pneumoniae NOT DETECTED NOT DETECTED Final   Proteus species NOT DETECTED NOT DETECTED Final   Salmonella species NOT DETECTED NOT DETECTED Final   Serratia marcescens NOT DETECTED NOT DETECTED Final   Haemophilus influenzae NOT DETECTED NOT DETECTED Final   Neisseria meningitidis NOT DETECTED NOT DETECTED Final   Pseudomonas aeruginosa NOT DETECTED NOT DETECTED Final   Stenotrophomonas maltophilia NOT DETECTED NOT DETECTED Final   Candida albicans NOT DETECTED NOT DETECTED Final   Candida auris NOT DETECTED NOT DETECTED Final   Candida glabrata NOT DETECTED NOT DETECTED Final   Candida krusei NOT DETECTED NOT DETECTED Final   Candida parapsilosis NOT DETECTED NOT DETECTED Final   Candida tropicalis NOT DETECTED NOT DETECTED Final   Cryptococcus neoformans/gattii NOT DETECTED NOT DETECTED Final    Comment: Performed at [facility], [address] [place], [place]  Resp Panel by RT-PCR (Flu A&B, Covid) Nasopharyngeal Swab     Status: None   Collection Time: 09/23/20 11:27 AM   Specimen: Nasopharyngeal Swab; Nasopharyngeal(NP) swabs in vial transport medium  Result Value Ref Range Status   SARS Coronavirus 2 by RT PCR NEGATIVE NEGATIVE Final    Comment: (NOTE) SARS-CoV-2 target nucleic acids are NOT DETECTED.  The SARS-CoV-2 RNA is generally detectable in upper respiratory specimens during the acute phase of infection. The lowest concentration of SARS-CoV-2 viral copies this assay can detect is 138 copies/mL. A negative result does not preclude SARS-Cov-2 infection and should not be used as the sole basis for treatment or other patient management decisions. A negative result may occur with  improper specimen collection/handling, submission of  specimen other than nasopharyngeal swab, presence of viral mutation(s) within the areas targeted by this assay, and inadequate number of viral copies(<138 copies/mL). A negative result must be combined with clinical observations, patient history, and epidemiological information. The expected result is Negative.  Fact Sheet for Patients:  09/25/20  Fact Sheet for Healthcare Providers:  BloggerCourse.com  This test is no t yet approved or cleared by the [place] FDA and  has been authorized for detection and/or diagnosis of SARS-CoV-2 by FDA under an Emergency Use Authorization (EUA). This EUA will remain  in effect (meaning this test can be used) for the duration of the COVID-19 declaration under  Section 564(b)(1) of the Act, 21 U.S.C.section 360bbb-3(b)(1), unless the authorization is terminated  or revoked sooner.       Influenza A by PCR NEGATIVE NEGATIVE Final   Influenza B by PCR NEGATIVE NEGATIVE Final    Comment: (NOTE) The Xpert Xpress SARS-CoV-2/FLU/RSV plus assay is intended as an aid in the diagnosis of influenza from Nasopharyngeal swab specimens and should not be used as a sole basis for treatment. Nasal washings and aspirates are unacceptable for Xpert Xpress SARS-CoV-2/FLU/RSV testing.  Fact Sheet for Patients: BloggerCourse.comhttps://www.fda.gov/media/152166/download  Fact Sheet for Healthcare Providers: SeriousBroker.ithttps://www.fda.gov/media/152162/download  This test is not yet approved or cleared by the [place] FDA and has been authorized for detection and/or diagnosis of SARS-CoV-2 by FDA under an Emergency Use Authorization (EUA). This EUA will remain in effect (meaning this test can be used) for the duration of the COVID-19 declaration under Section 564(b)(1) of the Act, 21 U.S.C. section 360bbb-3(b)(1), unless the authorization is terminated or revoked.  Performed at [facility], [address] [place], [place] [id number]     IMAGING RESULTS: X-ray foot no erosion or bony destruction.  There is soft tissue swelling. I have personally reviewed the films  CT abdomen shows 3 mm nonobstructing calculi in the lower pole of the right kidney ? Impression/Recommendation Patient with diabetes mellitus which is poorly controlled admitted with fever and chills.   ? Left foot wound chronic and for which [facility].  She had received vancomycin followed by oral doxycycline.  I did not see any wound cultures in the [facility] system.  Blood cultures were negative at that time. The wound does not look overtly infected.  Does not look to be a deep wound.  Doubt there is any bone involvement.  But because of fever and white count will treat with vancomycin and cefepime. She has been assessed by podiatrist and an MRI has been ordered.   Dr. Wyn Quakerew at bedside.  Angiogram is being planned for tomorrow.  ?Diabetes mellitus on insulin  ___________________________________________________ Discussed with patient and her daughter and requesting provider Note:  This document was prepared using Dragon voice recognition software and may include unintentional dictation errors.

## 2020-09-24 ENCOUNTER — Inpatient Hospital Stay: Payer: Medicaid Other

## 2020-09-24 ENCOUNTER — Other Ambulatory Visit: Payer: Self-pay

## 2020-09-24 ENCOUNTER — Encounter
Admission: EM | Disposition: A | Discharge status: Home / Self Care | Payer: Self-pay | Source: Home / Self Care | Attending: Internal Medicine

## 2020-09-24 DIAGNOSIS — A419 Sepsis, unspecified organism: Secondary | ICD-10-CM

## 2020-09-24 DIAGNOSIS — E11628 Type 2 diabetes mellitus with other skin complications: Secondary | ICD-10-CM | POA: Diagnosis not present

## 2020-09-24 DIAGNOSIS — I739 Peripheral vascular disease, unspecified: Secondary | ICD-10-CM | POA: Diagnosis not present

## 2020-09-24 DIAGNOSIS — L089 Local infection of the skin and subcutaneous tissue, unspecified: Secondary | ICD-10-CM | POA: Diagnosis not present

## 2020-09-24 DIAGNOSIS — R6520 Severe sepsis without septic shock: Secondary | ICD-10-CM

## 2020-09-24 DIAGNOSIS — I70245 Atherosclerosis of native arteries of left leg with ulceration of other part of foot: Secondary | ICD-10-CM

## 2020-09-24 HISTORY — PX: LOWER EXTREMITY ANGIOGRAPHY: CATH118251

## 2020-09-24 LAB — GLUCOSE, CAPILLARY
Glucose-Capillary: 262 mg/dL — ABNORMAL HIGH (ref 70–99)
Glucose-Capillary: 267 mg/dL — ABNORMAL HIGH (ref 70–99)
Glucose-Capillary: 263 mg/dL — ABNORMAL HIGH (ref 70–99)
Glucose-Capillary: 240 mg/dL — ABNORMAL HIGH (ref 70–99)
Glucose-Capillary: 292 mg/dL — ABNORMAL HIGH (ref 70–99)

## 2020-09-24 LAB — URINE CULTURE: Culture: NO GROWTH

## 2020-09-24 LAB — COMPREHENSIVE METABOLIC PANEL
ALT: 22 U/L (ref 0–44)
AST: 21 U/L (ref 15–41)
Albumin: 2.8 g/dL — ABNORMAL LOW (ref 3.5–5.0)
Alkaline Phosphatase: 82 U/L (ref 38–126)
Anion gap: 10 (ref 5–15)
BUN: 16 mg/dL (ref 6–20)
CO2: 24 mmol/L (ref 22–32)
Calcium: 8.2 mg/dL — ABNORMAL LOW (ref 8.9–10.3)
Chloride: 99 mmol/L (ref 98–111)
Creatinine, Ser: 1.02 mg/dL — ABNORMAL HIGH (ref 0.44–1.00)
GFR, Estimated: >60 mL/min (ref >60)
Glucose, Bld: 306 mg/dL — ABNORMAL HIGH (ref 70–99)
Potassium: 3.9 mmol/L (ref 3.5–5.1)
Sodium: 133 mmol/L — ABNORMAL LOW (ref 135–145)
Total Bilirubin: 0.7 mg/dL (ref 0.3–1.2)
Total Protein: 6.7 g/dL (ref 6.5–8.1)

## 2020-09-24 LAB — CBC
HCT: 33.7 % — ABNORMAL LOW (ref 36.0–46.0)
Hemoglobin: 10.2 g/dL — ABNORMAL LOW (ref 12.0–15.0)
MCH: 25.8 pg — ABNORMAL LOW (ref 26.0–34.0)
MCHC: 30.3 g/dL (ref 30.0–36.0)
MCV: 85.3 fL (ref 80.0–100.0)
Platelets: 198 10*3/uL (ref 150–400)
RBC: 3.95 MIL/uL (ref 3.87–5.11)
RDW: 15.1 % (ref 11.5–15.5)
WBC: 13 10*3/uL — ABNORMAL HIGH (ref 4.0–10.5)
nRBC: 0 % (ref 0.0–0.2)

## 2020-09-24 LAB — TROPONIN I (HIGH SENSITIVITY)
Troponin I (High Sensitivity): 331 ng/L (ref <18)
Troponin I (High Sensitivity): 225 ng/L (ref <18)

## 2020-09-24 LAB — PROCALCITONIN: Procalcitonin: 4.95 ng/mL

## 2020-09-24 LAB — CBG MONITORING, ED: Glucose-Capillary: 273 mg/dL — ABNORMAL HIGH (ref 70–99)

## 2020-09-24 SURGERY — LOWER EXTREMITY ANGIOGRAPHY
Anesthesia: Moderate Sedation | Laterality: Left

## 2020-09-24 MED ORDER — INFLUENZA VAC SPLIT QUAD 0.5 ML IM SUSY: 0.5000 mL | PREFILLED_SYRINGE | INTRAMUSCULAR | Status: DC | PRN

## 2020-09-24 MED ORDER — CLOPIDOGREL BISULFATE 75 MG PO TABS
75.0000 mg | ORAL_TABLET | Freq: Every day | ORAL | Status: DC
  Administered 2020-09-25: 75 mg via ORAL
  Filled 2020-09-24: qty 1

## 2020-09-24 MED ORDER — ASPIRIN 81 MG PO CHEW: 162.0000 mg | CHEWABLE_TABLET | Freq: Every day | ORAL | Status: DC

## 2020-09-24 MED ORDER — MIDAZOLAM HCL 2 MG/2ML IJ SOLN
INTRAMUSCULAR | Status: DC | PRN
  Administered 2020-09-24 (×2): 2 mg via INTRAVENOUS

## 2020-09-24 MED ORDER — ENOXAPARIN SODIUM 120 MG/0.8ML ~~LOC~~ SOLN
1.0000 mg/kg | SUBCUTANEOUS | Status: DC
  Administered 2020-09-24: 22:00:00 117 mg via SUBCUTANEOUS
  Filled 2020-09-24 (×2): qty 0.8

## 2020-09-24 MED ORDER — GABAPENTIN 300 MG PO CAPS
300.0000 mg | ORAL_CAPSULE | Freq: Every day | ORAL | Status: DC
  Administered 2020-09-24: 01:00:00 600 mg via ORAL
  Filled 2020-09-24: qty 1
  Filled 2020-09-24: qty 2

## 2020-09-24 MED ORDER — FENTANYL CITRATE (PF) 100 MCG/2ML IJ SOLN
INTRAMUSCULAR | Status: DC | PRN
  Administered 2020-09-24 (×2): 50 ug via INTRAVENOUS

## 2020-09-24 MED ORDER — DULOXETINE HCL 60 MG PO CPEP
60.0000 mg | ORAL_CAPSULE | Freq: Every day | ORAL | Status: DC
  Administered 2020-09-25: 60 mg via ORAL
  Filled 2020-09-24 (×2): qty 1
  Filled 2020-09-24: qty 2

## 2020-09-24 MED ORDER — IODIXANOL 320 MG/ML IV SOLN
INTRAVENOUS | Status: DC | PRN
  Administered 2020-09-24: 10:00:00 90 mL

## 2020-09-24 MED ORDER — ASPIRIN 81 MG PO CHEW
CHEWABLE_TABLET | ORAL | Status: AC
  Administered 2020-09-24: 01:00:00 162 mg via ORAL
  Filled 2020-09-24: qty 2

## 2020-09-24 MED ORDER — TRAZODONE HCL 100 MG PO TABS
100.0000 mg | ORAL_TABLET | Freq: Every day | ORAL | Status: DC
  Administered 2020-09-24 (×2): 100 mg via ORAL
  Filled 2020-09-24 (×3): qty 1

## 2020-09-24 MED ORDER — GABAPENTIN 300 MG PO CAPS
300.0000 mg | ORAL_CAPSULE | Freq: Every day | ORAL | Status: DC
  Administered 2020-09-24: 22:00:00 300 mg via ORAL
  Filled 2020-09-24: qty 1

## 2020-09-24 MED ORDER — ATORVASTATIN CALCIUM 20 MG PO TABS
40.0000 mg | ORAL_TABLET | Freq: Every day | ORAL | Status: DC
  Administered 2020-09-25: 40 mg via ORAL
  Filled 2020-09-24: qty 2

## 2020-09-24 MED ORDER — INSULIN REGULAR HUMAN (CONC) 500 UNIT/ML ~~LOC~~ SOPN
25.0000 [IU] | PEN_INJECTOR | Freq: Every day | SUBCUTANEOUS | Status: DC
  Administered 2020-09-24 – 2020-09-25 (×2): 25 [IU] via SUBCUTANEOUS
  Filled 2020-09-24: qty 3

## 2020-09-24 MED ORDER — MIDAZOLAM HCL 2 MG/2ML IJ SOLN
INTRAMUSCULAR | Status: AC
  Filled 2020-09-24: qty 2

## 2020-09-24 MED ORDER — MIDAZOLAM HCL 2 MG/2ML IJ SOLN
INTRAMUSCULAR | Status: AC
  Administered 2020-09-24: 09:00:00 1 mg
  Filled 2020-09-24: qty 2

## 2020-09-24 MED ORDER — VANCOMYCIN HCL 750 MG/150ML IV SOLN
750.0000 mg | Freq: Two times a day (BID) | INTRAVENOUS | Status: DC
  Administered 2020-09-25: 750 mg via INTRAVENOUS
  Filled 2020-09-24 (×2): qty 150

## 2020-09-24 MED ORDER — VANCOMYCIN HCL 10 G IV SOLR
2500.0000 mg | Freq: Once | INTRAVENOUS | Status: AC
  Administered 2020-09-24: 19:00:00 2500 mg via INTRAVENOUS
  Filled 2020-09-24: qty 2500

## 2020-09-24 MED ORDER — HEPARIN SODIUM (PORCINE) 1000 UNIT/ML IJ SOLN
INTRAMUSCULAR | Status: AC
  Filled 2020-09-24: qty 1

## 2020-09-24 MED ORDER — INSULIN REGULAR HUMAN (CONC) 500 UNIT/ML ~~LOC~~ SOPN
35.0000 [IU] | PEN_INJECTOR | Freq: Every day | SUBCUTANEOUS | Status: DC
  Filled 2020-09-24: qty 3

## 2020-09-24 MED ORDER — BUPROPION HCL ER (XL) 150 MG PO TB24
300.0000 mg | ORAL_TABLET | Freq: Every day | ORAL | Status: DC
  Administered 2020-09-25: 300 mg via ORAL
  Filled 2020-09-24: qty 2

## 2020-09-24 MED ORDER — OXYBUTYNIN CHLORIDE ER 10 MG PO TB24
10.0000 mg | ORAL_TABLET | Freq: Every day | ORAL | Status: DC
  Filled 2020-09-24: qty 1

## 2020-09-24 MED ORDER — FENTANYL CITRATE (PF) 100 MCG/2ML IJ SOLN
INTRAMUSCULAR | Status: AC
  Administered 2020-09-24: 09:00:00 25 ug
  Filled 2020-09-24: qty 2

## 2020-09-24 MED ORDER — OXYBUTYNIN CHLORIDE ER 10 MG PO TB24
10.0000 mg | ORAL_TABLET | Freq: Every day | ORAL | Status: DC
  Administered 2020-09-24: 22:00:00 10 mg via ORAL
  Filled 2020-09-24 (×2): qty 1

## 2020-09-24 MED ORDER — IOHEXOL 350 MG/ML SOLN
75.0000 mL | Freq: Once | INTRAVENOUS | Status: AC | PRN
  Administered 2020-09-24: 75 mL via INTRAVENOUS
  Filled 2020-09-24: qty 75

## 2020-09-24 MED ORDER — HEPARIN SODIUM (PORCINE) 1000 UNIT/ML IJ SOLN
INTRAMUSCULAR | Status: DC | PRN
  Administered 2020-09-24: 5000 [IU] via INTRAVENOUS

## 2020-09-24 SURGICAL SUPPLY — 25 items
BALLN LUTONIX 018 5X150X130 (BALLOONS) ×2
BALLN LUTONIX 018 5X300X130 (BALLOONS) ×2
BALLN LUTONIX 5X220X130 (BALLOONS) ×4
BALLN LUTONIX DCB 4X100X130 (BALLOONS) ×2
BALLN LUTONIX DCB 7X60X130 (BALLOONS) ×2
BALLOON LUTONIX 018 5X150X130 (BALLOONS) IMPLANT
BALLOON LUTONIX 018 5X300X130 (BALLOONS) IMPLANT
BALLOON LUTONIX 5X220X130 (BALLOONS) IMPLANT
BALLOON LUTONIX DCB 4X100X130 (BALLOONS) IMPLANT
BALLOON LUTONIX DCB 7X60X130 (BALLOONS) IMPLANT
CATH BEACON 5 .035 65 KMP TIP (CATHETERS) ×1 IMPLANT
CATH BEACON 5 .038 100 VERT TP (CATHETERS) ×1 IMPLANT
CATH PIG 70CM (CATHETERS) ×1 IMPLANT
CATH TEMPO 5F RIM 65CM (CATHETERS) ×1 IMPLANT
DEVICE STARCLOSE SE CLOSURE (Vascular Products) ×1 IMPLANT
GLIDEWIRE ADV .035X260CM (WIRE) ×1 IMPLANT
KIT ENCORE 26 ADVANTAGE (KITS) ×1 IMPLANT
PACK ANGIOGRAPHY (CUSTOM PROCEDURE TRAY) ×2 IMPLANT
SHEATH ANL2 6FRX45 HC (SHEATH) ×1 IMPLANT
SHEATH BRITE TIP 5FRX11 (SHEATH) ×1 IMPLANT
STENT LIFESTAR 9X60 (Permanent Stent) ×1 IMPLANT
STENT VIABAHN 6X150X120 (Permanent Stent) ×1 IMPLANT
STENT VIABAHN 6X250X120 (Permanent Stent) ×1 IMPLANT
WIRE G V18X300CM (WIRE) ×1 IMPLANT
WIRE GUIDERIGHT .035X150 (WIRE) ×2 IMPLANT

## 2020-09-24 NOTE — Progress Notes (Signed)
HOSPITAL MEDICINE OVERNIGHT EVENT NOTE    Notified by nursing that patient's serial troponins continued to trend upwards, from 133 late in the evening on 12/29 to 331 early in the morning on 12/30.  Patient continues to be chest pain-free.  Patient is continuing to exhibit sinus tachycardia secondary to underlying sepsis which is actively being treated.  Despite rising troponin, this continues to be likely secondary to underlying supply demand mismatch due to ongoing infection.  I do not believe that initiation of anticoagulation is necessary at this time.  Continuing to treat underlying infection with antibiotic therapy, continuing close clinical monitoring with telemetry.  Obtaining serial EKG.  Obtaining repeat troponin at 9 AM.  We will formally consult cardiology in the morning.  Marinda Elk  MD [facility]

## 2020-09-24 NOTE — ED Notes (Signed)
Pt alert and oriented. Informed she was going to surgery, pt states she wasn't aware but understood she would be educated on surgery prior to surgery start. Pt appreciative. OR RN in pt room to take her to surgery. Pt calm and cooperative.

## 2020-09-24 NOTE — Op Note (Signed)
[facility]  Percutaneous Study/Intervention Procedural Note   Date of Surgery: 09/24/2020  Surgeon(s):Faron Tudisco    Assistants:none  Pre-operative Diagnosis: PAD with ulceration left lower extremity  Post-operative diagnosis:  Same  Procedure(s) Performed:             1.  Ultrasound guidance for vascular access right femoral artery             2.  Catheter placement into left common femoral artery from right femoral approach             3.  Aortogram and selective left lower extremity angiogram             4.  Percutaneous transluminal angioplasty of left anterior tibial artery with 4 mm diameter Lutonix drug-coated angioplasty balloon             5.   Percutaneous transluminal angioplasty of the left above-knee popliteal artery and the entire SFA with two 5 mm diameter by 22 cm length Lutonix drug-coated angioplasty balloons  6.  Viabahn stent placement x2 to the left SFA and above-knee popliteal arteries with 6 mm diameter by 25 cm length stent and 6 mm diameter by 15 cm length stent  7.  Life star stent placement to the left external iliac artery with 9 mm diameter by 6 cm length stent             8.  StarClose closure device right femoral artery  EBL: 10 cc  Contrast: 90 cc  Fluoro Time: 10.2 minutes  Moderate Conscious Sedation Time: approximately 60 minutes using 4 mg of Versed and 100 mcg of Fentanyl              Indications:  Patient is a 59 y.o.female with nonhealing ulceration on the left foot and a history of significant peripheral arterial disease. The patient is brought in for angiography for further evaluation and potential treatment.  Due to the limb threatening nature of the situation, angiogram was performed for attempted limb salvage. The patient is aware that if the procedure fails, amputation would be expected.  The patient also understands that even with successful revascularization, amputation may still be required due to the severity of  the situation.  Risks and benefits are discussed and informed consent is obtained.   Procedure:  The patient was identified and appropriate procedural time out was performed.  The patient was then placed supine on the table and prepped and draped in the usual sterile fashion. Moderate conscious sedation was administered during a face to face encounter with the patient throughout the procedure with my supervision of the RN administering medicines and monitoring the patient's vital signs, pulse oximetry, telemetry and mental status throughout from the start of the procedure until the patient was taken to the recovery room. Ultrasound was used to evaluate the right common femoral artery.  It was patent .  A digital ultrasound image was acquired.  A Seldinger needle was used to access the right common femoral artery under direct ultrasound guidance and a permanent image was performed.  A 0.035 J wire was advanced without resistance and a 5Fr sheath was placed.  Pigtail catheter was placed into the aorta and an AP aortogram was performed. This demonstrated that the renal arteries appeared to be patent.  The aorta was patent.  The right iliac system had mild disease in the external and common iliac arteries but nothing high-grade.  The left common iliac artery was mildly ectatic but  not stenotic.  The left external iliac artery had about a 60% stenosis in its proximal segment. I then crossed the aortic bifurcation and advanced to the left femoral head. Selective left lower extremity angiogram was then performed. This demonstrated a flush occlusion of the SFA which reconstituted about 10 cm distally in the SFA.  The SFA and popliteal artery were diffusely diseased below this with multiple areas of greater than 70% stenosis including about a 90% stenosis at Hunter's canal.  The anterior tibial artery had about a 70 to 80% stenosis over the first 5 to 6 cm of the vessel but was then continuous to the foot.  The peroneal  artery provided a second runoff vessel although it was a little smaller it did not have focal stenosis.  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 It was felt that it was in the patient's best interest to proceed with intervention after these images to avoid a second procedure and a larger amount of contrast and fluoroscopy based off of the findings from the initial angiogram. The patient was systemically heparinized and a 6 [place] [employer] sheath was then placed over the [employer] wire. I then used a Kumpe catheter and the advantage wire to navigate through the SFA occlusion surprisingly easily and then get down into the diseased SFA and popliteal arteries.  We then navigated through that and advanced into the anterior tibial artery where we crossed the proximal stenosis of the anterior tibial artery without difficulty as well.  Intraluminal flow was confirmed in the mid anterior tibial artery.  We then proceeded with intervention.  A 4 mm diameter by 10 cm length Lutonix drug-coated angioplasty balloon was inflated to 6 atm for 1 minute in the proximal left anterior tibial artery.  The popliteal artery in the mid and proximal segment and the entire SFA were then treated with 2 separate 5 mm diameter by 22 cm length Lutonix drug-coated angioplasty balloons inflated to 8 atm for 1 minute.  Completion imaging showed multiple areas of greater than 50% residual stenosis throughout the SFA and above-knee popliteal artery requiring stent placement.  I then exchanged for 0.018 wire.  A 6 mm diameter by 25 cm length Viabahn stent was then deployed initially followed by 6 mm diameter by 15 cm length Viabahn stent.  These went from the proximal SFA about 1 cm beyond its origin down to the above-knee popliteal artery.  These were postdilated with 5 mm balloons with excellent angiographic completion result and less than 10% residual  stenosis.  There is about a 20% residual stenosis in the proximal anterior tibial artery that was nonflow limiting.  I then pulled the sheath back and address the iliac lesion.  A 9 mm diameter by 6 cm length life star stent was deployed from the origin of the left external iliac artery down to the mid to distal left external iliac artery.  This was postdilated with a 7 mm diameter Lutonix drug-coated angioplasty balloon with excellent angiographic completion result and less than 10% residual stenosis. I elected to terminate the procedure. The sheath was removed and StarClose closure device was deployed in the right femoral artery with excellent hemostatic result. The patient was taken to the recovery room in stable condition having tolerated the procedure well.  Findings:               Aortogram:  Renal arteries appeared to be patent.  The  aorta was patent.  The right iliac system had mild disease in the external and common iliac arteries but nothing high-grade.  The left common iliac artery was mildly ectatic but not stenotic.  The left external iliac artery had about a 60% stenosis in its proximal segment.             Left lower Extremity:  This demonstrated a flush occlusion of the SFA which reconstituted about 10 cm distally in the SFA.  The SFA and popliteal artery were diffusely diseased below this with multiple areas of greater than 70% stenosis including about a 90% stenosis at Hunter's canal.  The anterior tibial artery had about a 70 to 80% stenosis over the first 5 to 6 cm of the vessel but was then continuous to the foot.  The peroneal artery provided a second runoff vessel although it was a little smaller it did not have focal stenosis.  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  Complications:  None  Leotis Pain 09/24/2020 10:30 AM   This note was created with Dragon Medical transcription system. Any errors in dictation are purely unintentional.

## 2020-09-24 NOTE — Progress Notes (Signed)
PODIATRY / FOOT AND ANKLE SURGERY PROGRESS NOTE  Requesting Physician: Lauree Chandler, MD  Reason for consult: Left foot wound, cellulitis  Chief Complaint: Left foot wound   HPI: [name] is a 59 y.o. female who presents today resting in bed comfortably eating lunch with boyfriend at bedside.  Patient does not currently have a dressing on the left foot.  She states that the dressing that was applied yesterday was removed and no one has came to change it.  Patient has not had anyone placed Santyl on the wound that was ordered yesterday.  Patient recently underwent angiogram with Dr. Lucky Cowboy today and is awaiting her MRI results.  PMHx:  Past Medical History:  Diagnosis Date  . Anemia   . Anxiety   . Arthritis   . CHF (congestive heart failure) ([place])   . COPD (chronic obstructive pulmonary disease) ([place])   . Coronary artery disease   . Diabetes mellitus without complication ([place])   . Fibromyalgia   . GERD (gastroesophageal reflux disease)   . Hypertension   . Peripheral vascular disease ([place])   . Sleep apnea   . Stroke [facility])     Surgical Hx:  Past Surgical History:  Procedure Laterality Date  . CESAREAN SECTION    . CORONARY ANGIOPLASTY WITH STENT PLACEMENT Left   . DILATION AND CURETTAGE OF UTERUS    . ENDARTERECTOMY Left 01/29/2015   Procedure: ENDARTERECTOMY CAROTID;  Surgeon: Algernon Huxley, MD;  Location: [facility] ORS;  Service: Vascular;  Laterality: Left;  . PERIPHERAL VASCULAR CATHETERIZATION Right 05/27/2015   Procedure: Lower Extremity Angiography;  Surgeon: Algernon Huxley, MD;  Location: [place] [facility];  Service: Cardiovascular;  Laterality: Right;  . PERIPHERAL VASCULAR CATHETERIZATION  05/27/2015   Procedure: Lower Extremity Intervention;  Surgeon: Algernon Huxley, MD;  Location: [place] [facility];  Service: Cardiovascular;;  . PERIPHERAL VASCULAR CATHETERIZATION Right 09/22/2016   Procedure: Lower Extremity Angiography;  Surgeon: Algernon Huxley, MD;  Location: [place] [facility];  Service: Cardiovascular;  Laterality: Right;  . TUBAL LIGATION      FHx: No family history on file.  Social History:  reports that she quit smoking about 4 years ago. Her smoking use included cigarettes. She has a 9.50 pack-year smoking history. She uses smokeless tobacco. She reports that she does not drink alcohol and does not use drugs.  Allergies:  Allergies  Allergen Reactions  . Niacin And Related Nausea And Vomiting    Review of Systems: General ROS: negative Respiratory ROS: no cough, shortness of breath, or wheezing Cardiovascular ROS: no chest pain or dyspnea on exertion Gastrointestinal ROS: no abdominal pain, change in bowel habits, or black or bloody stools Musculoskeletal ROS: positive for - joint pain Neurological ROS: positive for - numbness/tingling Dermatological ROS: L lateral 5th MTPJ ulceration  Medications Prior to Admission  Medication Sig Dispense Refill  . atorvastatin (LIPITOR) 40 MG tablet Take 40 mg by mouth daily.    Marland Kitchen buPROPion (WELLBUTRIN XL) 150 MG 24 hr tablet Take 150 mg by mouth daily. (take with $RemoveBe'300mg'WjDiDYRoL$  tablet to equal $Remove'450mg'YQPtfUH$  total)    . buPROPion (WELLBUTRIN XL) 300 MG 24 hr tablet Take 300 mg by mouth daily. (take with $RemoveBe'150mg'PPqQyEeMA$  tablet to equal $Remove'450mg'WheCToy$  total)    . cetirizine (ZYRTEC) 10 MG tablet Take 10 mg by mouth daily.    . clopidogrel (PLAVIX) 75 MG tablet Take 75 mg by mouth daily.    . Dulaglutide (TRULICITY) 2.84 XL/2.4MW SOPN Inject  0.75 mg into the skin every Tuesday.    . DULoxetine (CYMBALTA) 60 MG capsule Take 60 mg by mouth daily.    . enalapril (VASOTEC) 5 MG tablet Take 5 mg by mouth daily.    Marland Kitchen gabapentin (NEURONTIN) 300 MG capsule Take 300-600 mg by mouth at bedtime.    Marland Kitchen HUMULIN R U-500 KWIKPEN 500 UNIT/ML kwikpen Inject 50-70 Units into the skin See admin instructions. Inject 70u under the skin every morning before breakfast and inject 50u under the skin every evening before supper  3  . metFORMIN (GLUCOPHAGE-XR)  500 MG 24 hr tablet Take 500 mg by mouth daily with supper.    Marland Kitchen oxybutynin (DITROPAN-XL) 10 MG 24 hr tablet Take 10 mg by mouth daily.    . pantoprazole (PROTONIX) 40 MG tablet Take 40 mg by mouth daily.    . traZODone (DESYREL) 50 MG tablet Take 100 mg by mouth at bedtime.      Physical Exam: General: Alert and oriented.  No apparent distress.  Vascular: DP/PT pulses left foot faintly palpable, foot is warm to touch, capillary fill time does appear to be intact to digits.  No hair growth noted to left lower extremity.  Neuro: Light touch sensation absent to left lower extremity.  Derm: Left lateral fifth metatarsal phalangeal joint ulceration present which measures approximately 2 x 2 cm but has minimal depth overall to 0.1 cm/dermis, no probing to bone.  Does appear to have an area underneath the plantar aspect which appears to be a stable dry eschar.  Minimal to no erythema or edema present.   MSK: Mild pain on palpation to the left lateral foot at the fifth metatarsal phalangeal joint.  Right below-knee amputation  Results for orders placed or performed during the hospital encounter of 09/23/20 (from the past 48 hour(s))  Comprehensive metabolic panel     Status: Abnormal   Collection Time: 09/23/20  7:07 AM  Result Value Ref Range   Sodium 133 (L) 135 - 145 mmol/L   Potassium 4.0 3.5 - 5.1 mmol/L   Chloride 99 98 - 111 mmol/L   CO2 24 22 - 32 mmol/L   Glucose, Bld 350 (H) 70 - 99 mg/dL    Comment: Glucose reference range applies only to samples taken after fasting for at least 8 hours.   BUN 16 6 - 20 mg/dL   Creatinine, Ser 0.88 0.44 - 1.00 mg/dL   Calcium 8.9 8.9 - 10.3 mg/dL   Total Protein 7.0 6.5 - 8.1 g/dL   Albumin 3.1 (L) 3.5 - 5.0 g/dL   AST 15 15 - 41 U/L   ALT 19 0 - 44 U/L   Alkaline Phosphatase 103 38 - 126 U/L   Total Bilirubin 0.9 0.3 - 1.2 mg/dL   GFR, Estimated >60 >60 mL/min    Comment: (NOTE) Calculated using the CKD-EPI Creatinine Equation (2021)     Anion gap 10 5 - 15    Comment: Performed at [facility], [address] [place], [place] [address]  Lactic acid, plasma     Status: None   Collection Time: 09/23/20  7:07 AM  Result Value Ref Range   Lactic Acid, Venous 1.8 0.5 - 1.9 mmol/L    Comment: Performed at [facility], [address] [place], [place] [address]  CBC with Differential     Status: Abnormal   Collection Time: 09/23/20  7:07 AM  Result Value Ref Range   WBC 17.1 (H) 4.0 - 10.5  K/uL   RBC 4.27 3.87 - 5.11 MIL/uL   Hemoglobin 11.3 (L) 12.0 - 15.0 g/dL   HCT 35.6 (L) 36.0 - 46.0 %   MCV 83.4 80.0 - 100.0 fL   MCH 26.5 26.0 - 34.0 pg   MCHC 31.7 30.0 - 36.0 g/dL   RDW 15.0 11.5 - 15.5 %   Platelets 256 150 - 400 K/uL   nRBC 0.0 0.0 - 0.2 %   Neutrophils Relative % 92 %   Neutro Abs 15.6 (H) 1.7 - 7.7 K/uL   Lymphocytes Relative 2 %   Lymphs Abs 0.4 (L) 0.7 - 4.0 K/uL   Monocytes Relative 5 %   Monocytes Absolute 0.9 0.1 - 1.0 K/uL   Eosinophils Relative 0 %   Eosinophils Absolute 0.1 0.0 - 0.5 K/uL   Basophils Relative 0 %   Basophils Absolute 0.1 0.0 - 0.1 K/uL   Immature Granulocytes 1 %   Abs Immature Granulocytes 0.08 (H) 0.00 - 0.07 K/uL    Comment: Performed at [facility], [address] [place], [place] [address]  Protime-INR     Status: None   Collection Time: 09/23/20  7:07 AM  Result Value Ref Range   Prothrombin Time 13.9 11.4 - 15.2 seconds   INR 1.1 0.8 - 1.2    Comment: (NOTE) INR goal varies based on device and disease states. Performed at [facility], [address] [place], [place] [address]   Culture, blood (Routine x 2)     Status: None (Preliminary result)   Collection Time: 09/23/20  7:10 AM   Specimen: BLOOD RIGHT ARM  Result Value Ref Range   Specimen Description BLOOD RIGHT ARM    Special Requests      BOTTLES DRAWN AEROBIC AND ANAEROBIC Blood Culture results may not be optimal due to an excessive volume of blood received in  culture bottles   Culture      NO GROWTH < 24 HOURS Performed at [facility], [address], [place], [place] [address]    Report Status PENDING   APTT     Status: None   Collection Time: 09/23/20  7:10 AM  Result Value Ref Range   aPTT 30 24 - 36 seconds    Comment: Performed at [facility], [address] [place], [place] [address]  CK     Status: None   Collection Time: 09/23/20  7:10 AM  Result Value Ref Range   Total CK 90 38 - 234 U/L    Comment: Performed at [facility], [address] [place], [place] [address]  Hemoglobin A1c     Status: Abnormal   Collection Time: 09/23/20  7:10 AM  Result Value Ref Range   Hgb A1c MFr Bld 8.6 (H) 4.8 - 5.6 %    Comment: (NOTE) Pre diabetes:          5.7%-6.4%  Diabetes:              >6.4%  Glycemic control for   <7.0% adults with diabetes    Mean Plasma Glucose 200.12 mg/dL    Comment: Performed at [facility], [place] [address] [place], [place] [address]  Procalcitonin - Baseline     Status: None   Collection Time: 09/23/20  7:10 AM  Result Value Ref Range   Procalcitonin 0.21 ng/mL    Comment:        Interpretation: PCT (Procalcitonin) <= 0.5 ng/mL: Systemic infection (sepsis) is not likely.  Local bacterial infection is possible. (NOTE)       Sepsis PCT Algorithm           Lower Respiratory Tract                                      Infection PCT Algorithm    ----------------------------     ----------------------------         PCT < 0.25 ng/mL                PCT < 0.10 ng/mL          Strongly encourage             Strongly discourage   discontinuation of antibiotics    initiation of antibiotics    ----------------------------     -----------------------------       PCT 0.25 - 0.50 ng/mL            PCT 0.10 - 0.25 ng/mL               OR       >80% decrease in PCT            Discourage initiation of                                            antibiotics      Encourage  discontinuation           of antibiotics    ----------------------------     -----------------------------         PCT >= 0.50 ng/mL              PCT 0.26 - 0.50 ng/mL               AND        <80% decrease in PCT             Encourage initiation of                                             antibiotics       Encourage continuation           of antibiotics    ----------------------------     -----------------------------        PCT >= 0.50 ng/mL                  PCT > 0.50 ng/mL               AND         increase in PCT                  Strongly encourage                                      initiation of antibiotics    Strongly encourage escalation           of antibiotics                                     -----------------------------  PCT <= 0.25 ng/mL                                                 OR                                        > 80% decrease in PCT                                      Discontinue / Do not initiate                                             antibiotics  Performed at [facility], [address] [place], [place] [address]   TSH     Status: None   Collection Time: 09/23/20  7:10 AM  Result Value Ref Range   TSH 1.335 0.350 - 4.500 uIU/mL    Comment: Performed by a 3rd Generation assay with a functional sensitivity of <=0.01 uIU/mL. Performed at [facility], [address] [place], [place] [address]   Culture, blood (Routine x 2)     Status: None (Preliminary result)   Collection Time: 09/23/20  7:11 AM   Specimen: BLOOD  Result Value Ref Range   Specimen Description BLOOD    Special Requests      BOTTLES DRAWN AEROBIC AND ANAEROBIC Blood Culture adequate volume   Culture  Setup Time      Organism ID to follow GRAM POSITIVE COCCI ANAEROBIC BOTTLE ONLY CRITICAL RESULT CALLED TO, READ BACK BY AND VERIFIED WITH: JASON ROBINS $RemoveBefo'[email or web address]'[id number]$  ON 09/23/20 SKL Performed at [facility], [address] [place], [place] [address]    Culture GRAM POSITIVE COCCI    Report Status PENDING   C-reactive protein     Status: Abnormal   Collection Time: 09/23/20  7:11 AM  Result Value Ref Range   CRP 3.7 (H) <1.0 mg/dL    Comment: Performed at [facility], [address] [place], [place] [address]  Blood Culture ID Panel (Reflexed)     Status: Abnormal   Collection Time: 09/23/20  7:11 AM  Result Value Ref Range   Enterococcus faecalis NOT DETECTED NOT DETECTED   Enterococcus Faecium NOT DETECTED NOT DETECTED   Listeria monocytogenes NOT DETECTED NOT DETECTED   Staphylococcus species NOT DETECTED NOT DETECTED   Staphylococcus aureus (BCID) NOT DETECTED NOT DETECTED   Staphylococcus epidermidis NOT DETECTED NOT DETECTED   Staphylococcus lugdunensis NOT DETECTED NOT DETECTED   Streptococcus species DETECTED (A) NOT DETECTED    Comment: Not Enterococcus species, Streptococcus agalactiae, Streptococcus pyogenes, or Streptococcus pneumoniae. CRITICAL RESULT CALLED TO, READ BACK BY AND VERIFIED WITH: JASON ROBINS $RemoveBefo'[email or web address]$  ON 09/23/20 SKL    Streptococcus agalactiae NOT DETECTED NOT DETECTED   Streptococcus pneumoniae NOT DETECTED NOT DETECTED   Streptococcus pyogenes NOT DETECTED NOT DETECTED   A.calcoaceticus-baumannii NOT DETECTED NOT DETECTED   Bacteroides fragilis NOT DETECTED NOT DETECTED   Enterobacterales NOT DETECTED NOT DETECTED   Enterobacter cloacae complex NOT DETECTED NOT DETECTED  Escherichia coli NOT DETECTED NOT DETECTED   Klebsiella aerogenes NOT DETECTED NOT DETECTED   Klebsiella oxytoca NOT DETECTED NOT DETECTED   Klebsiella pneumoniae NOT DETECTED NOT DETECTED   Proteus species NOT DETECTED NOT DETECTED   Salmonella species NOT DETECTED NOT DETECTED   Serratia marcescens NOT DETECTED NOT DETECTED   Haemophilus influenzae NOT DETECTED NOT DETECTED   Neisseria meningitidis NOT DETECTED NOT DETECTED   Pseudomonas aeruginosa NOT DETECTED NOT  DETECTED   Stenotrophomonas maltophilia NOT DETECTED NOT DETECTED   Candida albicans NOT DETECTED NOT DETECTED   Candida auris NOT DETECTED NOT DETECTED   Candida glabrata NOT DETECTED NOT DETECTED   Candida krusei NOT DETECTED NOT DETECTED   Candida parapsilosis NOT DETECTED NOT DETECTED   Candida tropicalis NOT DETECTED NOT DETECTED   Cryptococcus neoformans/gattii NOT DETECTED NOT DETECTED    Comment: Performed at [facility], [name] [place], [place] [address]  Lactic acid, plasma     Status: None   Collection Time: 09/23/20 11:27 AM  Result Value Ref Range   Lactic Acid, Venous 1.6 0.5 - 1.9 mmol/L    Comment: Performed at [facility], [address], [place], [place] [address]  Resp Panel by RT-PCR (Flu A&B, Covid) Nasopharyngeal Swab     Status: None   Collection Time: 09/23/20 11:27 AM   Specimen: Nasopharyngeal Swab; Nasopharyngeal(NP) swabs in vial transport medium  Result Value Ref Range   SARS Coronavirus 2 by RT PCR NEGATIVE NEGATIVE    Comment: (NOTE) SARS-CoV-2 target nucleic acids are NOT DETECTED.  The SARS-CoV-2 RNA is generally detectable in upper respiratory specimens during the acute phase of infection. The lowest concentration of SARS-CoV-2 viral copies this assay can detect is 138 copies/mL. A negative result does not preclude SARS-Cov-2 infection and should not be used as the sole basis for treatment or other patient management decisions. A negative result may occur with  improper specimen collection/handling, submission of specimen other than nasopharyngeal swab, presence of viral mutation(s) within the areas targeted by this assay, and inadequate number of viral copies(<138 copies/mL). A negative result must be combined with clinical observations, patient history, and epidemiological information. The expected result is Negative.  Fact Sheet for Patients:  EntrepreneurPulse.com.au  Fact Sheet for Healthcare  Providers:  IncredibleEmployment.be  This test is no t yet approved or cleared by the [place] FDA and  has been authorized for detection and/or diagnosis of SARS-CoV-2 by FDA under an Emergency Use Authorization (EUA). This EUA will remain  in effect (meaning this test can be used) for the duration of the COVID-19 declaration under Section 564(b)(1) of the Act, 21 U.S.C.section 360bbb-3(b)(1), unless the authorization is terminated  or revoked sooner.       Influenza A by PCR NEGATIVE NEGATIVE   Influenza B by PCR NEGATIVE NEGATIVE    Comment: (NOTE) The Xpert Xpress SARS-CoV-2/FLU/RSV plus assay is intended as an aid in the diagnosis of influenza from Nasopharyngeal swab specimens and should not be used as a sole basis for treatment. Nasal washings and aspirates are unacceptable for Xpert Xpress SARS-CoV-2/FLU/RSV testing.  Fact Sheet for Patients: EntrepreneurPulse.com.au  Fact Sheet for Healthcare Providers: IncredibleEmployment.be  This test is not yet approved or cleared by the [place] FDA and has been authorized for detection and/or diagnosis of SARS-CoV-2 by FDA under an Emergency Use Authorization (EUA). This EUA will remain in effect (meaning this test can be used) for the duration of the COVID-19 declaration under Section 564(b)(1) of  the Act, 21 U.S.C. section 360bbb-3(b)(1), unless the authorization is terminated or revoked.  Performed at [facility], [address] [place], [place] [address]   POC SARS Coronavirus 2 Ag-ED - Nasal Swab (BD Veritor Kit)     Status: None   Collection Time: 09/23/20 11:33 AM  Result Value Ref Range   SARS Coronavirus 2 Ag NEGATIVE NEGATIVE    Comment: (NOTE) SARS-CoV-2 antigen NOT DETECTED.   Negative results are presumptive.  Negative results do not preclude SARS-CoV-2 infection and should not be used as the sole basis for treatment or other patient  management decisions, including infection  control decisions, particularly in the presence of clinical signs and  symptoms consistent with COVID-19, or in those who have been in contact with the virus.  Negative results must be combined with clinical observations, patient history, and epidemiological information. The expected result is Negative.  Fact Sheet for Patients: PodPark.tn  Fact Sheet for Healthcare Providers: GiftContent.is   This test is not yet approved or cleared by the [place] FDA and  has been authorized for detection and/or diagnosis of SARS-CoV-2 by FDA under an Emergency Use Authorization (EUA).  This EUA will remain in effect (meaning this test can be used) for the duration of  the C OVID-19 declaration under Section 564(b)(1) of the Act, 21 U.S.C. section 360bbb-3(b)(1), unless the authorization is terminated or revoked sooner.    Urinalysis, Complete w Microscopic     Status: Abnormal   Collection Time: 09/23/20  2:13 PM  Result Value Ref Range   Color, Urine YELLOW (A) YELLOW   APPearance HAZY (A) CLEAR   Specific Gravity, Urine 1.039 (H) 1.005 - 1.030   pH 5.0 5.0 - 8.0   Glucose, UA >=500 (A) NEGATIVE mg/dL   Hgb urine dipstick SMALL (A) NEGATIVE   Bilirubin Urine NEGATIVE NEGATIVE   Ketones, ur 5 (A) NEGATIVE mg/dL   Protein, ur >=300 (A) NEGATIVE mg/dL   Nitrite POSITIVE (A) NEGATIVE   Leukocytes,Ua NEGATIVE NEGATIVE   RBC / HPF 0-5 0 - 5 RBC/hpf   WBC, UA 0-5 0 - 5 WBC/hpf   Bacteria, UA NONE SEEN NONE SEEN   Squamous Epithelial / LPF NONE SEEN 0 - 5    Comment: Performed at [facility], [address] [place], [place] [address]  Fibrin derivatives D-Dimer [facility] only)     Status: Abnormal   Collection Time: 09/23/20  3:08 PM  Result Value Ref Range   Fibrin derivatives D-dimer ([facility]) 1,094.66 (H) 0.00 - 499.00 ng/mL (FEU)    Comment: (NOTE) <> Exclusion of Venous  Thromboembolism (VTE) - OUTPATIENT ONLY   (Emergency Department or [place])    0-499 ng/ml (FEU): With a low to intermediate pretest probability                      for VTE this test result excludes the diagnosis                      of VTE.   >499 ng/ml (FEU) : VTE not excluded; additional work up for VTE is                      required.  <> Testing on Inpatients and Evaluation of Disseminated Intravascular   Coagulation (DIC) Reference Range:   0-499 ng/ml (FEU) Performed at [facility], [address] [place], [place] [address]   Troponin I (High Sensitivity)  Status: Abnormal   Collection Time: 09/23/20  3:08 PM  Result Value Ref Range   Troponin I (High Sensitivity) 94 (H) <18 ng/L    Comment: (NOTE) Elevated high sensitivity troponin I (hsTnI) values and significant  changes across serial measurements may suggest ACS but many other  chronic and acute conditions are known to elevate hsTnI results.  Refer to the "Links" section for chest pain algorithms and additional  guidance. Performed at [facility], [address] [place], [place] [address]   Magnesium     Status: Abnormal   Collection Time: 09/23/20  3:08 PM  Result Value Ref Range   Magnesium 1.3 (L) 1.7 - 2.4 mg/dL    Comment: Performed at [facility], [address] [place], [place] [address]  Aerobic/Anaerobic Culture (surgical/deep wound)     Status: None (Preliminary result)   Collection Time: 09/23/20  3:08 PM   Specimen: Wound  Result Value Ref Range   Specimen Description      WOUND Performed at [facility], [address] [place], [place] [address]    Special Requests      FOOT LEFT Performed at [facility], [address] [place], [place] [address]    Gram Stain      RARE WBC PRESENT, PREDOMINANTLY PMN NO ORGANISMS SEEN    Culture      TOO YOUNG TO READ Performed at [facility], [place] [address], [place], [place] [address]     Report Status PENDING   CBG monitoring, ED     Status: Abnormal   Collection Time: 09/23/20  4:37 PM  Result Value Ref Range   Glucose-Capillary 327 (H) 70 - 99 mg/dL    Comment: Glucose reference range applies only to samples taken after fasting for at least 8 hours.  Troponin I (High Sensitivity)     Status: Abnormal   Collection Time: 09/23/20  4:40 PM  Result Value Ref Range   Troponin I (High Sensitivity) 133 (HH) <18 ng/L    Comment: CRITICAL RESULT CALLED TO, READ BACK BY AND VERIFIED WITH ISABELLA LAPIETRA $RemoveBeforeDEI'@1735'[id number]$  09/23/20 MJU (NOTE) Elevated high sensitivity troponin I (hsTnI) values and significant  changes across serial measurements may suggest ACS but many other  chronic and acute conditions are known to elevate hsTnI results.  Refer to the "Links" section for chest pain algorithms and additional  guidance. Performed at [facility], [address] [place], [place] [address]   CBG monitoring, ED     Status: Abnormal   Collection Time: 09/23/20 11:57 PM  Result Value Ref Range   Glucose-Capillary 311 (H) 70 - 99 mg/dL    Comment: Glucose reference range applies only to samples taken after fasting for at least 8 hours.  Procalcitonin     Status: None   Collection Time: 09/24/20  3:21 AM  Result Value Ref Range   Procalcitonin 4.95 ng/mL    Comment:        Interpretation: PCT > 2 ng/mL: Systemic infection (sepsis) is likely, unless other causes are known. (NOTE)       Sepsis PCT Algorithm           Lower Respiratory Tract                                      Infection PCT Algorithm    ----------------------------     ----------------------------  PCT < 0.25 ng/mL                PCT < 0.10 ng/mL          Strongly encourage             Strongly discourage   discontinuation of antibiotics    initiation of antibiotics    ----------------------------     -----------------------------       PCT 0.25 - 0.50 ng/mL            PCT 0.10 - 0.25 ng/mL                OR       >80% decrease in PCT            Discourage initiation of                                            antibiotics      Encourage discontinuation           of antibiotics    ----------------------------     -----------------------------         PCT >= 0.50 ng/mL              PCT 0.26 - 0.50 ng/mL               AND       <80% decrease in PCT              Encourage initiation of                                             antibiotics       Encourage continuation           of antibiotics    ----------------------------     -----------------------------        PCT >= 0.50 ng/mL                  PCT > 0.50 ng/mL               AND         increase in PCT                  Strongly encourage                                      initiation of antibiotics    Strongly encourage escalation           of antibiotics                                     -----------------------------                                           PCT <= 0.25 ng/mL  OR                                        > 80% decrease in PCT                                      Discontinue / Do not initiate                                             antibiotics  Performed at [facility], [address] [place], [place] [address]   Comprehensive metabolic panel     Status: Abnormal   Collection Time: 09/24/20  3:21 AM  Result Value Ref Range   Sodium 133 (L) 135 - 145 mmol/L   Potassium 3.9 3.5 - 5.1 mmol/L   Chloride 99 98 - 111 mmol/L   CO2 24 22 - 32 mmol/L   Glucose, Bld 306 (H) 70 - 99 mg/dL    Comment: Glucose reference range applies only to samples taken after fasting for at least 8 hours.   BUN 16 6 - 20 mg/dL   Creatinine, Ser 1.02 (H) 0.44 - 1.00 mg/dL   Calcium 8.2 (L) 8.9 - 10.3 mg/dL   Total Protein 6.7 6.5 - 8.1 g/dL   Albumin 2.8 (L) 3.5 - 5.0 g/dL   AST 21 15 - 41 U/L   ALT 22 0 - 44 U/L   Alkaline Phosphatase 82 38 - 126 U/L   Total  Bilirubin 0.7 0.3 - 1.2 mg/dL   GFR, Estimated >60 >60 mL/min    Comment: (NOTE) Calculated using the CKD-EPI Creatinine Equation (2021)    Anion gap 10 5 - 15    Comment: Performed at [facility], [address] [place], [place] [address]  CBC     Status: Abnormal   Collection Time: 09/24/20  3:21 AM  Result Value Ref Range   WBC 13.0 (H) 4.0 - 10.5 K/uL   RBC 3.95 3.87 - 5.11 MIL/uL   Hemoglobin 10.2 (L) 12.0 - 15.0 g/dL   HCT 33.7 (L) 36.0 - 46.0 %   MCV 85.3 80.0 - 100.0 fL   MCH 25.8 (L) 26.0 - 34.0 pg   MCHC 30.3 30.0 - 36.0 g/dL   RDW 15.1 11.5 - 15.5 %   Platelets 198 150 - 400 K/uL   nRBC 0.0 0.0 - 0.2 %    Comment: Performed at [facility], [address] [place], [place] [address]  Troponin I (High Sensitivity)     Status: Abnormal   Collection Time: 09/24/20  3:21 AM  Result Value Ref Range   Troponin I (High Sensitivity) 331 (HH) <18 ng/L    Comment: CRITICAL VALUE NOTED. VALUE IS CONSISTENT WITH PREVIOUSLY REPORTED/CALLED VALUE HNM (NOTE) Elevated high sensitivity troponin I (hsTnI) values and significant  changes across serial measurements may suggest ACS but many other  chronic and acute conditions are known to elevate hsTnI results.  Refer to the "Links" section for chest pain algorithms and additional  guidance. Performed at [facility], [address] [place],  [address]   CBG monitoring, ED     Status: Abnormal   Collection Time: 09/24/20  7:54 AM  Result Value Ref Range   Glucose-Capillary 273 (H) 70 - 99 mg/dL    Comment: Glucose reference range applies only to samples taken after fasting for at least 8 hours.  Glucose, capillary     Status: Abnormal   Collection Time: 09/24/20  8:36 AM  Result Value Ref Range   Glucose-Capillary 262 (H) 70 - 99 mg/dL    Comment: Glucose reference range applies only to samples taken after fasting for at least 8 hours.  Glucose, capillary     Status: Abnormal   Collection  Time: 09/24/20 11:14 AM  Result Value Ref Range   Glucose-Capillary 267 (H) 70 - 99 mg/dL    Comment: Glucose reference range applies only to samples taken after fasting for at least 8 hours.  Glucose, capillary     Status: Abnormal   Collection Time: 09/24/20 12:18 PM  Result Value Ref Range   Glucose-Capillary 263 (H) 70 - 99 mg/dL    Comment: Glucose reference range applies only to samples taken after fasting for at least 8 hours.   Comment 1 Notify RN    Comment 2 Document in Chart    DG Chest 2 View  Result Date: 09/23/2020 CLINICAL DATA:  Sepsis EXAM: CHEST - 2 VIEW COMPARISON:  October 06, 2008 FINDINGS: Lungs are clear. Heart is upper normal in size with pulmonary vascularity normal. No adenopathy. There is mild degenerative change in the thoracic spine. IMPRESSION: No edema or airspace opacity.  Heart upper normal in size. Electronically Signed   By: Lowella Grip III M.D.   On: 09/23/2020 08:12   CT ANGIO CHEST PE W OR WO CONTRAST  Result Date: 09/24/2020 CLINICAL DATA:  59 year old female with cellulitis. Shortness of breath. EXAM: CT ANGIOGRAPHY CHEST WITH CONTRAST TECHNIQUE: Multidetector CT imaging of the chest was performed using the standard protocol during bolus administration of intravenous contrast. Multiplanar CT image reconstructions and MIPs were obtained to evaluate the vascular anatomy. CONTRAST:  28mL OMNIPAQUE IOHEXOL 350 MG/ML SOLN COMPARISON:  Chest radiographs 09/23/2020.  CTA neck 12/29/2014. CT Abdomen and Pelvis 09/23/2020, chest CT 08/09/2004. FINDINGS: Cardiovascular: Adequate contrast bolus timing in the pulmonary arterial tree. Mild respiratory motion. No central or saddle embolus. At the hila the bilateral pulmonary arteries appear patent. No convincing filling defect identified to suggest acute pulmonary embolism. Cardiomegaly. No pericardial effusion. Calcified coronary artery atherosclerosis. Calcified aortic atherosclerosis. Mediastinum/Nodes:  Negative.  No mediastinal lymphadenopathy. Negative thyroid for age, chronic subcentimeter thyroid nodules and calcification stable since 2016. Lungs/Pleura: Major airways are patent. Mild dependent opacity at both lung bases most resembles atelectasis. No pleural effusion, pulmonary nodule, or inflammatory appearing pulmonary opacity. Upper Abdomen: Negative visible liver, gallbladder, spleen, pancreas, left adrenal gland, renal upper poles and bowel in the upper abdomen. Chronic round low-density right adrenal nodule measures less than 10 Hounsfield units and is stable since 2005 consistent with benign adrenal adenoma. Musculoskeletal: Degenerative changes in the spine. No acute osseous abnormality identified. Review of the MIP images confirms the above findings. IMPRESSION: 1. Negative for acute pulmonary embolus. 2. Mild pulmonary atelectasis. No other acute finding in the chest. 3. Cardiomegaly. Calcified coronary artery and Aortic Atherosclerosis (ICD10-I70.0). 4. Benign right adrenal adenoma. Electronically Signed   By: Genevie Ann M.D.   On: 09/24/2020 04:30   CT ABDOMEN PELVIS W CONTRAST  Result Date: 09/23/2020 CLINICAL DATA:  Abdominal pain EXAM: CT ABDOMEN AND PELVIS WITH CONTRAST TECHNIQUE: Multidetector CT imaging of the abdomen and pelvis was performed using the  standard protocol following bolus administration of intravenous contrast. CONTRAST:  OMNIPAQUE IOHEXOL 300 MG/ML  SOLN COMPARISON:  CT chest 2005 FINDINGS: Lower chest: Bibasilar atelectasis. Hepatobiliary: No focal liver abnormality is seen. No gallstones, gallbladder wall thickening, or biliary dilatation. Pancreas: Unremarkable. Spleen: Unremarkable. Adrenals/Urinary Tract: There is a 3.4 x 2.8 cm lesion of the right adrenal, which was also present on a 2005 chest CT and is therefore likely benign. Two 3 mm nonobstructing calculi of the lower pole of the right kidney. Small cyst of the interpolar right kidney. Partially distended  bladder is unremarkable. Stomach/Bowel: Stomach is within normal limits. Bowel is normal in caliber. Normal appendix. Vascular/Lymphatic: Aortoiliac atherosclerosis. No enlarged lymph nodes identified. There are nonspecific small para-aortic retroperitoneal nodes with adjacent fat infiltration. Reproductive: Uterus and bilateral adnexa are unremarkable. Other: No ascites.  No acute abnormality of the abdominal wall. Musculoskeletal: Advanced degenerative changes of the included spine. No acute osseous abnormality. IMPRESSION: No acute abnormality. Small nonobstructing right renal calculi. Aortic atherosclerosis. Electronically Signed   By: Guadlupe Spanish M.D.   On: 09/23/2020 09:39   MR FOOT LEFT WO CONTRAST  Result Date: 09/23/2020 CLINICAL DATA:  Swelling, diabetic osteomyelitis EXAM: MRI OF THE LEFT FOOT WITHOUT CONTRAST TECHNIQUE: Multiplanar, multisequence MR imaging of the left was performed. No intravenous contrast was administered. COMPARISON:  None. FINDINGS: The study is somewhat limited due to patient motion. Bones/Joint/Cartilage No areas of bony erosion cortical destruction are seen. There is mildly increased signal seen within the fifth metatarsal head without associated T1 hypointensity. Ligaments The Lisfranc ligaments are intact. Muscles and Tendons Mild fatty atrophy with increased feathery signal seen throughout the forefoot. The flexor and extensor tendons are intact. The plantar fascia is intact. However there is increased signal and thickening seen within the middle cord of the plantar fascia with adjacent calcaneal enthesophyte. Soft tissues There is diffuse subcutaneous edema seen the heel pad. There is question of focal soft tissue swelling seen on the plantar base the fifth metatarsal head. No loculated fluid collection or free air. IMPRESSION: Focal soft tissue swelling seen on the plantar base beneath the fifth metatarsal head with probable reactive marrow within the fifth metatarsal  head. No definite evidence of osteomyelitis. However somewhat limited due to patient motion. Electronically Signed   By: Jonna Clark M.D.   On: 09/23/2020 21:44   PERIPHERAL VASCULAR CATHETERIZATION  Result Date: 09/24/2020 See op note  US Venous Img Lower Bilateral (DVT)  Result Date: 09/24/2020 CLINICAL DATA:  Bilateral leg pain EXAM: BILATERAL LOWER EXTREMITY VENOUS DOPPLER ULTRASOUND TECHNIQUE: Gray-scale sonography with compression, as well as color and duplex ultrasound, were performed to evaluate the deep venous system(s) from the level of the common femoral vein through the popliteal and proximal calf veins. COMPARISON:  None. FINDINGS: VENOUS Normal compressibility of the common femoral, superficial femoral, and popliteal veins, as well as the visualized calf veins of the left lower extremity. Right below-knee amputation has been performed. Visualized portions of profunda femoral vein and great saphenous vein unremarkable. No filling defects to suggest DVT on grayscale or color Doppler imaging. Doppler waveforms show normal direction of venous flow, normal respiratory plasticity and response to augmentation. OTHER None. Limitations: none IMPRESSION: Negative. Electronically Signed   By: Helyn Numbers MD   On: 09/24/2020 03:27   DG Foot 2 Views Left  Result Date: 09/23/2020 CLINICAL DATA:  Soft tissue wound with redness EXAM: LEFT FOOT - 2 VIEW COMPARISON:  None. FINDINGS: Frontal and lateral views were  obtained. There is generalized soft tissue swelling. No soft tissue air. No fracture or dislocation. There is spurring in the dorsal midfoot region. Other joint spaces appear unremarkable. No erosive change or bony destruction. There are prominent posterior and inferior calcaneal spurs. There is calcification in much of the posterior plantar fascia. IMPRESSION: Soft tissue swelling without soft tissue air. No erosion or bony destruction. Spurring dorsal midfoot. No fracture or dislocation.  Prominent calcaneal spurs. There is plantar fascia calcification. Electronically Signed   By: Lowella Grip III M.D.   On: 09/23/2020 08:14    Blood pressure 132/75, pulse (!) 101, temperature 99.8 F (37.7 C), temperature source Oral, resp. rate 16, height _0  (1.626 m), weight 117.9 kg, SpO2 94 %.  Assessment 1. Left foot fifth metatarsal phalangeal joint ulceration with stable necrotic eschar 2. Diabetes type 2 polyneuropathy 3. PVD status post right BKA  Plan -Patient seen and examined -X-ray imaging reviewed discussed with patient in detail.  No obvious evidence of osteomyelitis noted. -MRI reviewed.  No evidence for osteomyelitis.  Reactive marrow edema on T2/STIR imaging but no T1 marrow replacement. -Wound appears to be stable at this time to the plantar aspect of the right foot and appears to have a stable eschar centrally with dermis exposed around the periphery of the wound. -Applied Betadine wet-to-dry dressing today. -Recommend Santyl dressings daily applying Santyl to the wound followed by 4 x 4 gauze, ABD, Kerlix, Ace wrap with minimal compression.  Wound care consult ordered.  Home health orders placed. -Appreciate vascular recommendations from Dr. Lucky Cowboy.  Angiogram appears to show patient has all 3 vessels with some degree of blood flow to the foot. -Appreciate infectious disease recommendations for oral antibiotics upon discharge. -Patient can be partial weightbearing in a surgical shoe at this time to left foot.  Surgical shoe has been ordered.  Podiatry team to sign off at this time.  Patient should follow up with wound care center.  Discharge instructions placed in chart.  Caroline More, DPM 09/24/2020, 2:05 PM

## 2020-09-24 NOTE — Interval H&P Note (Signed)
History and Physical Interval Note:  09/24/2020 8:20 AM  [name]  has presented today for surgery, with the diagnosis of PAD with ulceration.  The various methods of treatment have been discussed with the patient and family. After consideration of risks, benefits and other options for treatment, the patient has consented to  Procedure(s): Lower Extremity Angiography (Left) as a surgical intervention.  The patient's history has been reviewed, patient examined, no change in status, stable for surgery.  I have reviewed the patient's chart and labs.  Questions were answered to the patient's satisfaction.     Festus Barren

## 2020-09-24 NOTE — Plan of Care (Signed)

## 2020-09-24 NOTE — Progress Notes (Signed)
Inpatient Diabetes Program Recommendations  AACE/ADA: New Consensus Statement on Inpatient Glycemic Control   Target Ranges:  Prepandial:   less than 140 mg/dL      Peak postprandial:   less than 180 mg/dL (1-2 hours)      Critically ill patients:  140 - 180 mg/dL  Results for AASHIKA, CARTA (MRN [id number]) as of 09/24/2020 07:39  Ref. Range 09/24/2020 03:21  Glucose Latest Ref Range: 70 - 99 mg/dL 614 (H)   Results for CHALESE, PEACH (MRN [id number]) as of 09/24/2020 07:39  Ref. Range 09/23/2020 16:37 09/23/2020 23:57  Glucose-Capillary Latest Ref Range: 70 - 99 mg/dL 761 (H) 950 (H)   Review of Glycemic Control  history: DM2 Outpatient Diabetes medications: Humulin R U500 100 units with breakfast, Humulin R U500 50 units with supper, Januvia 10 mg daily, Metformin 1000 mg BID, Glipizide 10 mg BID Current orders for Inpatient glycemic control: Humulin R U500 70 units QAM, Humulin R U500 50 units QPM, Novolog 0-20 units TID with meals, Novolog 0-5 units QHS  Inpatient Diabetes Program Recommendations:    Insulin: Noted Humulin R U500 has not been given since arriving in ED and lab glucose 306 mg/dl this morning. May want to consider decreasing Humulin R U500 to 35 units QAM with breakfast and Humulin R U500 25 units with supper.   NOTE: In reviewing chart, noted patient was inpatient at [facility] 08/29/20-09/05/20 and was followed by Endocrinology and was given Humulin R U500 inpatient. Per discharge notes on 09/05/20 patient was asked to take Humulin R U500 70 units with breakfast, Humulin R U500 50 units with supper.  Thanks, Orlando Penner, RN, MSN, CDE Diabetes Coordinator Inpatient Diabetes Program [phone] (Team Pager from 8am to 5pm)

## 2020-09-24 NOTE — Consult Note (Signed)
WOC Nurse Consult Note: Reason for Consult:debridement left 5th metatarsal.  Concern over dressing removed and not replaced and new consult placed to our team.  Podiatry had previously ordered santyl (yesterday at 1830) and medication can be placed by bedside RN.  WOC recommendations not needed at this time as orders are in place.  Wound type:infectious neuropathic Pressure Injury POA: NA Measurement:not assessed.  Wound bed: ruddy red per photo in chart.   Periwound:revascularization to this leg has been performed to promote perfusion and healing.  Dressing procedure/placement/frequency:Bedside RN to cleanse left foot wound with NS and pat dry  Apply Santyl to wound per podiatry order. Cover with NS moist gauze and secure with dry gauze and kerlix and tape daily.  Will not follow at this time.  Please re-consult if needed.  Cynthia Hudson MSN, RN, FNP-BC CWON Wound, Ostomy, Continence Nurse Pager [phone]

## 2020-09-24 NOTE — ED Notes (Signed)
US at bedside

## 2020-09-24 NOTE — Progress Notes (Addendum)
Misty Stanley from [facility] ([phone]) called to notify the social worker that the pt is already getting services from [facility]. Social worker to contact [facility].

## 2020-09-24 NOTE — Progress Notes (Signed)
Pharmacy Antibiotic Note  [name] is a 59 y.o. female admitted on 09/23/2020 with sepsis. Pt presented with fever and chills x12-24h. PMH includes anemia, anxiety, arthritis, CHF, COPD, CAD, diabetes, fibromyalgia, GERD, HTN, OSA, and PVD s/p R BKA. Pt was recently admitted to [facility] from 12/4-12/11 for L foot cellulitis due to infected laceration. Pt was initially treated with clindamycin >> vancomycin >> doxycycline through 12/13 with improvement in cellulitis. Pharmacy has been consulted for vancomycin and cefepime dosing for a LLE recurrent complicated soft tissue infection associated with infected non-healing diabetic foot ulcer with early sepsis without shock. Pt being started on broad spectrum antibiotics due to second hospitalization in 2 weeks for IV antibiotics.   12/29 MRI: No definite evidence of osteomyelitis  12/30: Pt received 2500 LD on 12/29 @ ~1130. Pt scheduled to received 750 mg q12h starting 12/30 @ 0600 - does not appear that pt received this dose per review.  Scr stable ~ 1, WBC 14.9>17.1>13.0, 24h Tmax: 100.7  Plan: Pt would benefit from another LD as it has been ~ 30 hours since last dose. Give vancomycin 2500 mg LD x 1, followed by Vancomycin 750 mg IV every 12 hours.  Goal trough 15-20 mcg/mL.  Plan to draw a level prior to 4th dose Continue cefepime 2g q8h Monitor renal function with AM labs  Height: 5\' 4"  (162.6 cm) Weight: 117.9 kg (260 lb) IBW/kg (Calculated) : 54.7  Temp (24hrs), Avg:99.8 F (37.7 C), Min:99 F (37.2 C), Max:100.7 F (38.2 C)  Recent Labs  Lab 09/23/20 0707 09/23/20 1127 09/24/20 0321  WBC 17.1*  --  13.0*  CREATININE 0.88  --  1.02*  LATICACIDVEN 1.8 1.6  --     Estimated Creatinine Clearance: 74.1 mL/min (A) (by C-G formula based on SCr of 1.02 mg/dL (H)).    Allergies  Allergen Reactions  . Niacin And Related Nausea And Vomiting    Antimicrobials this admission: 12/29 cefepime >> 12/29 metronidazole x 1 12/29  vancomycin >>  Microbiology results: 12/29 BCx: 1/4 bottles + streptococcus species - organism not identified  12/29 UCx: pending 12/29 wound cx: pending  Thank you for allowing pharmacy to be a part of this patient's care.  1/30, PharmD Pharmacy Resident  09/24/2020 5:55 PM

## 2020-09-24 NOTE — Progress Notes (Signed)
PROGRESS NOTE    [name]  BJY:[id number] DOB: Feb 20, 1961 DOA: 09/23/2020 PCP: Sandrea Hughs, NP    Assessment & Plan:   Active Problems:   Cellulitis   Diabetic foot infection (HCC)  LLE recurrent complicated soft tissue infection: associated with infected non healing diabetic foot ulcer. Continue on IV vanco & cefepime as per ID. S/p wound debridement of left foot 5th metatarsal phalangeal joint on 09/23/20 as per podiatry. MRI of left foot shows no definite evidence of osteomyelitis. S/p stent placement to left external iliac artery, angioplasty as per vascular surg. Vascular surg, ID, podiatry recs apprec.  Sepsis: meets criteria w/ fever, tachypnea, elevated WBCs & LLE infection. Continue on IV abxs.   Leukocytosis: reactive vs infection. Continue on IV abxs as per ID   Normocytic anemia: no need for a transfusion currently. Will continue to monitor   PVD: s/p right BKA. Continue on statin, plavix. 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 Continue on regular insulin, SSI w/ accuchecks  AKI: baseline Cr/GFR is unknown. Avoid nephrotoxic meds   CAD: continue on home dose of statin, plavix   OSA: continue on CPAP   Mood Disorder: unknown type or severity. Continue on home dose of bupropion, duloxetine   Morbid Obesity: BMI 44.6. Complicates overall care and prognosis. Needs significant weight loss   DVT prophylaxis: lovenox Code Status: full  Family Communication: discussed pt's care w/ pt's family at bedside and answered their questions Disposition Plan: depends on PT/OT recs (not consulted yet)   Status is: Inpatient  Remains inpatient appropriate because:Ongoing diagnostic testing needed not appropriate for outpatient work up, Unsafe d/c plan, IV treatments appropriate due to intensity of illness or inability to take PO and Inpatient level of care appropriate due to severity  of illness   Dispo: The patient is from: Home              Anticipated d/c is to: SNF vs home health               Anticipated d/c date is: 3 days              Patient currently is not medically stable to d/c.        Consultants:   ID  Vascular surg  Podiatry    Procedures:  Ultrasound guidance for vascular access right femoral artery 2. Catheter placement into left common femoral artery from right femoral approach 3. Aortogram and selective left lower extremity angiogram 4. Percutaneous transluminal angioplasty of left anterior tibial artery with 4 mm diameter Lutonix drug-coated angioplasty balloon 5.  Percutaneous transluminal angioplasty of the left above-knee popliteal artery and the entire SFA with two 5 mm diameter by 22 cm length Lutonix drug-coated angioplasty balloons             6.  Viabahn stent placement x2 to the left SFA and above-knee popliteal arteries with 6 mm diameter by 25 cm length stent and 6 mm diameter by 15 cm length stent             7.  Life star stent placement to the left external iliac artery with 9 mm diameter by 6 cm length stent 8. StarClose closure device right femoral artery    Antimicrobials:    Subjective: Pt c/o fatigue   Objective: Vitals:   09/24/20 0004 09/24/20 0329 09/24/20 0633 09/24/20 0827  BP:  126/80 132/76  140/76  Pulse:  (!) 107 (!) 110 (!) 106  Resp:  16 (!) 23 18  Temp: 99 F (37.2 C)     TempSrc: Oral     SpO2:  94% 93% 93%  Weight:      Height:        Intake/Output Summary (Last 24 hours) at 09/24/2020 0829 Last data filed at 09/24/2020 0704 Gross per 24 hour  Intake 200 ml  Output --  Net 200 ml   Filed Weights   09/23/20 0708  Weight: 117.9 kg    Examination:  General exam: Appears calm and comfortable  Respiratory system: Clear to auscultation. Respiratory effort normal. Cardiovascular system: S1 & S2 +. No rubs, gallops or  clicks.  Gastrointestinal system: Abdomen is obese soft and nontender.  Normal bowel sounds heard. Central nervous system: Alert and oriented.  Extremities:  R BKA & LLE is dressed & dressing is C/D/I  Psychiatry: Judgement and insight appear normal. Mood & affect appropriate.     Data Reviewed: I have personally reviewed following labs and imaging studies  CBC: Recent Labs  Lab 09/23/20 0707 09/24/20 0321  WBC 17.1* 13.0*  NEUTROABS 15.6*  --   HGB 11.3* 10.2*  HCT 35.6* 33.7*  MCV 83.4 85.3  PLT 256 198   Basic Metabolic Panel: Recent Labs  Lab 09/23/20 0707 09/23/20 1508 09/24/20 0321  NA 133*  --  133*  K 4.0  --  3.9  CL 99  --  99  CO2 24  --  24  GLUCOSE 350*  --  306*  BUN 16  --  16  CREATININE 0.88  --  1.02*  CALCIUM 8.9  --  8.2*  MG  --  1.3*  --    GFR: Estimated Creatinine Clearance: 74.1 mL/min (A) (by C-G formula based on SCr of 1.02 mg/dL (H)). Liver Function Tests: Recent Labs  Lab 09/23/20 0707 09/24/20 0321  AST 15 21  ALT 19 22  ALKPHOS 103 82  BILITOT 0.9 0.7  PROT 7.0 6.7  ALBUMIN 3.1* 2.8*   No results for input(s): LIPASE, AMYLASE in the last 168 hours. No results for input(s): AMMONIA in the last 168 hours. Coagulation Profile: Recent Labs  Lab 09/23/20 0707  INR 1.1   Cardiac Enzymes: Recent Labs  Lab 09/23/20 0710  CKTOTAL 90   BNP (last 3 results) No results for input(s): PROBNP in the last 8760 hours. HbA1C: Recent Labs    09/23/20 0710  HGBA1C 8.6*   CBG: Recent Labs  Lab 09/23/20 1637 09/23/20 2357  GLUCAP 327* 311*   Lipid Profile: No results for input(s): CHOL, HDL, LDLCALC, TRIG, CHOLHDL, LDLDIRECT in the last 72 hours. Thyroid Function Tests: Recent Labs    09/23/20 0710  TSH 1.335   Anemia Panel: No results for input(s): VITAMINB12, FOLATE, FERRITIN, TIBC, IRON, RETICCTPCT in the last 72 hours. Sepsis Labs: Recent Labs  Lab 09/23/20 0707 09/23/20 0710 09/23/20 1127 09/24/20 0321   PROCALCITON  --  0.21  --  4.95  LATICACIDVEN 1.8  --  1.6  --     Recent Results (from the past 240 hour(s))  Culture, blood (Routine x 2)     Status: None (Preliminary result)   Collection Time: 09/23/20  7:10 AM   Specimen: BLOOD RIGHT ARM  Result Value Ref Range Status   Specimen Description BLOOD RIGHT ARM  Final   Special Requests   Final    BOTTLES DRAWN AEROBIC AND ANAEROBIC Blood Culture  results may not be optimal due to an excessive volume of blood received in culture bottles   Culture   Final    NO GROWTH < 24 HOURS Performed at [facility], [address] [place], [place] [address]    Report Status PENDING  Incomplete  Culture, blood (Routine x 2)     Status: None (Preliminary result)   Collection Time: 09/23/20  7:11 AM   Specimen: BLOOD  Result Value Ref Range Status   Specimen Description BLOOD  Final   Special Requests   Final    BOTTLES DRAWN AEROBIC AND ANAEROBIC Blood Culture adequate volume   Culture  Setup Time   Final    Organism ID to follow GRAM POSITIVE COCCI ANAEROBIC BOTTLE ONLY CRITICAL RESULT CALLED TO, READ BACK BY AND VERIFIED WITH: JASON ROBINS  ON 09/23/20 SKL Performed at [facility], [address] [place], [place] [address]    Culture GRAM POSITIVE COCCI  Final   Report Status PENDING  Incomplete  Blood Culture ID Panel (Reflexed)     Status: Abnormal   Collection Time: 09/23/20  7:11 AM  Result Value Ref Range Status   Enterococcus faecalis NOT DETECTED NOT DETECTED Final   Enterococcus Faecium NOT DETECTED NOT DETECTED Final   Listeria monocytogenes NOT DETECTED NOT DETECTED Final   Staphylococcus species NOT DETECTED NOT DETECTED Final   Staphylococcus aureus (BCID) NOT DETECTED NOT DETECTED Final   Staphylococcus epidermidis NOT DETECTED NOT DETECTED Final   Staphylococcus lugdunensis NOT DETECTED NOT DETECTED Final   Streptococcus species DETECTED (A) NOT DETECTED Final    Comment: Not Enterococcus  species, Streptococcus agalactiae, Streptococcus pyogenes, or Streptococcus pneumoniae. CRITICAL RESULT CALLED TO, READ BACK BY AND VERIFIED WITH: JASON ROBINS  ON 09/23/20 SKL    Streptococcus agalactiae NOT DETECTED NOT DETECTED Final   Streptococcus pneumoniae NOT DETECTED NOT DETECTED Final   Streptococcus pyogenes NOT DETECTED NOT DETECTED Final   A.calcoaceticus-baumannii NOT DETECTED NOT DETECTED Final   Bacteroides fragilis NOT DETECTED NOT DETECTED Final   Enterobacterales NOT DETECTED NOT DETECTED Final   Enterobacter cloacae complex NOT DETECTED NOT DETECTED Final   Escherichia coli NOT DETECTED NOT DETECTED Final   Klebsiella aerogenes NOT DETECTED NOT DETECTED Final   Klebsiella oxytoca NOT DETECTED NOT DETECTED Final   Klebsiella pneumoniae NOT DETECTED NOT DETECTED Final   Proteus species NOT DETECTED NOT DETECTED Final   Salmonella species NOT DETECTED NOT DETECTED Final   Serratia marcescens NOT DETECTED NOT DETECTED Final   Haemophilus influenzae NOT DETECTED NOT DETECTED Final   Neisseria meningitidis NOT DETECTED NOT DETECTED Final   Pseudomonas aeruginosa NOT DETECTED NOT DETECTED Final   Stenotrophomonas maltophilia NOT DETECTED NOT DETECTED Final   Candida albicans NOT DETECTED NOT DETECTED Final   Candida auris NOT DETECTED NOT DETECTED Final   Candida glabrata NOT DETECTED NOT DETECTED Final   Candida krusei NOT DETECTED NOT DETECTED Final   Candida parapsilosis NOT DETECTED NOT DETECTED Final   Candida tropicalis NOT DETECTED NOT DETECTED Final   Cryptococcus neoformans/gattii NOT DETECTED NOT DETECTED Final    Comment: Performed at [facility], [address] [place], [place] [address]  Resp Panel by RT-PCR (Flu A&B, Covid) Nasopharyngeal Swab     Status: None   Collection Time: 09/23/20 11:27 AM   Specimen: Nasopharyngeal Swab; Nasopharyngeal(NP) swabs in vial transport medium  Result Value Ref Range Status   SARS Coronavirus 2 by RT PCR  NEGATIVE NEGATIVE Final  Comment: (NOTE) SARS-CoV-2 target nucleic acids are NOT DETECTED.  The SARS-CoV-2 RNA is generally detectable in upper respiratory specimens during the acute phase of infection. The lowest concentration of SARS-CoV-2 viral copies this assay can detect is 138 copies/mL. A negative result does not preclude SARS-Cov-2 infection and should not be used as the sole basis for treatment or other patient management decisions. A negative result may occur with  improper specimen collection/handling, submission of specimen other than nasopharyngeal swab, presence of viral mutation(s) within the areas targeted by this assay, and inadequate number of viral copies(<138 copies/mL). A negative result must be combined with clinical observations, patient history, and epidemiological information. The expected result is Negative.  Fact Sheet for Patients:  BloggerCourse.com  Fact Sheet for Healthcare Providers:  SeriousBroker.it  This test is no t yet approved or cleared by the [place] FDA and  has been authorized for detection and/or diagnosis of SARS-CoV-2 by FDA under an Emergency Use Authorization (EUA). This EUA will remain  in effect (meaning this test can be used) for the duration of the COVID-19 declaration under Section 564(b)(1) of the Act, 21 U.S.C.section 360bbb-3(b)(1), unless the authorization is terminated  or revoked sooner.       Influenza A by PCR NEGATIVE NEGATIVE Final   Influenza B by PCR NEGATIVE NEGATIVE Final    Comment: (NOTE) The Xpert Xpress SARS-CoV-2/FLU/RSV plus assay is intended as an aid in the diagnosis of influenza from Nasopharyngeal swab specimens and should not be used as a sole basis for treatment. Nasal washings and aspirates are unacceptable for Xpert Xpress SARS-CoV-2/FLU/RSV testing.  Fact Sheet for Patients: BloggerCourse.com  Fact Sheet for  Healthcare Providers: SeriousBroker.it  This test is not yet approved or cleared by the [place] FDA and has been authorized for detection and/or diagnosis of SARS-CoV-2 by FDA under an Emergency Use Authorization (EUA). This EUA will remain in effect (meaning this test can be used) for the duration of the COVID-19 declaration under Section 564(b)(1) of the Act, 21 U.S.C. section 360bbb-3(b)(1), unless the authorization is terminated or revoked.  Performed at [facility], [address] [place], [place] [address]   Aerobic/Anaerobic Culture (surgical/deep wound)     Status: None (Preliminary result)   Collection Time: 09/23/20  3:08 PM   Specimen: Wound  Result Value Ref Range Status   Specimen Description   Final    WOUND Performed at [facility], [address], [place], [place] [address]    Special Requests   Final    FOOT LEFT Performed at [facility], [address] [place], [place] [address]    Gram Stain   Final    RARE WBC PRESENT, PREDOMINANTLY PMN NO ORGANISMS SEEN Performed at [facility], [address] [place], [place] [address]    Culture PENDING  Incomplete   Report Status PENDING  Incomplete         Radiology Studies: DG Chest 2 View  Result Date: 09/23/2020 CLINICAL DATA:  Sepsis EXAM: CHEST - 2 VIEW COMPARISON:  October 06, 2008 FINDINGS: Lungs are clear. Heart is upper normal in size with pulmonary vascularity normal. No adenopathy. There is mild degenerative change in the thoracic spine. IMPRESSION: No edema or airspace opacity.  Heart upper normal in size. Electronically Signed   By: Bretta Bang III M.D.   On: 09/23/2020 08:12   CT ANGIO CHEST PE W OR WO CONTRAST  Result Date: 09/24/2020 CLINICAL DATA:  59 year old female with cellulitis.  Shortness of breath. EXAM: CT ANGIOGRAPHY CHEST WITH CONTRAST TECHNIQUE: Multidetector CT imaging of the chest was performed using the  standard protocol during bolus administration of intravenous contrast. Multiplanar CT image reconstructions and MIPs were obtained to evaluate the vascular anatomy. CONTRAST:  75mL OMNIPAQUE IOHEXOL 350 MG/ML SOLN COMPARISON:  Chest radiographs 09/23/2020.  CTA neck 12/29/2014. CT Abdomen and Pelvis 09/23/2020, chest CT 08/09/2004. FINDINGS: Cardiovascular: Adequate contrast bolus timing in the pulmonary arterial tree. Mild respiratory motion. No central or saddle embolus. At the hila the bilateral pulmonary arteries appear patent. No convincing filling defect identified to suggest acute pulmonary embolism. Cardiomegaly. No pericardial effusion. Calcified coronary artery atherosclerosis. Calcified aortic atherosclerosis. Mediastinum/Nodes: Negative.  No mediastinal lymphadenopathy. Negative thyroid for age, chronic subcentimeter thyroid nodules and calcification stable since 2016. Lungs/Pleura: Major airways are patent. Mild dependent opacity at both lung bases most resembles atelectasis. No pleural effusion, pulmonary nodule, or inflammatory appearing pulmonary opacity. Upper Abdomen: Negative visible liver, gallbladder, spleen, pancreas, left adrenal gland, renal upper poles and bowel in the upper abdomen. Chronic round low-density right adrenal nodule measures less than 10 Hounsfield units and is stable since 2005 consistent with benign adrenal adenoma. Musculoskeletal: Degenerative changes in the spine. No acute osseous abnormality identified. Review of the MIP images confirms the above findings. IMPRESSION: 1. Negative for acute pulmonary embolus. 2. Mild pulmonary atelectasis. No other acute finding in the chest. 3. Cardiomegaly. Calcified coronary artery and Aortic Atherosclerosis (ICD10-I70.0). 4. Benign right adrenal adenoma. Electronically Signed   By: Odessa FlemingH  Hall M.D.   On: 09/24/2020 04:30   CT ABDOMEN PELVIS W CONTRAST  Result Date: 09/23/2020 CLINICAL DATA:  Abdominal pain EXAM: CT ABDOMEN AND PELVIS  WITH CONTRAST TECHNIQUE: Multidetector CT imaging of the abdomen and pelvis was performed using the standard protocol following bolus administration of intravenous contrast. CONTRAST:  100mL OMNIPAQUE IOHEXOL 300 MG/ML  SOLN COMPARISON:  CT chest 2005 FINDINGS: Lower chest: Bibasilar atelectasis. Hepatobiliary: No focal liver abnormality is seen. No gallstones, gallbladder wall thickening, or biliary dilatation. Pancreas: Unremarkable. Spleen: Unremarkable. Adrenals/Urinary Tract: There is a 3.4 x 2.8 cm lesion of the right adrenal, which was also present on a 2005 chest CT and is therefore likely benign. Two 3 mm nonobstructing calculi of the lower pole of the right kidney. Small cyst of the interpolar right kidney. Partially distended bladder is unremarkable. Stomach/Bowel: Stomach is within normal limits. Bowel is normal in caliber. Normal appendix. Vascular/Lymphatic: Aortoiliac atherosclerosis. No enlarged lymph nodes identified. There are nonspecific small para-aortic retroperitoneal nodes with adjacent fat infiltration. Reproductive: Uterus and bilateral adnexa are unremarkable. Other: No ascites.  No acute abnormality of the abdominal wall. Musculoskeletal: Advanced degenerative changes of the included spine. No acute osseous abnormality. IMPRESSION: No acute abnormality. Small nonobstructing right renal calculi. Aortic atherosclerosis. Electronically Signed   By: Guadlupe SpanishPraneil  Patel M.D.   On: 09/23/2020 09:39   MR FOOT LEFT WO CONTRAST  Result Date: 09/23/2020 CLINICAL DATA:  Swelling, diabetic osteomyelitis EXAM: MRI OF THE LEFT FOOT WITHOUT CONTRAST TECHNIQUE: Multiplanar, multisequence MR imaging of the left was performed. No intravenous contrast was administered. COMPARISON:  None. FINDINGS: The study is somewhat limited due to patient motion. Bones/Joint/Cartilage No areas of bony erosion cortical destruction are seen. There is mildly increased signal seen within the fifth metatarsal head without  associated T1 hypointensity. Ligaments The Lisfranc ligaments are intact. Muscles and Tendons Mild fatty atrophy with increased feathery signal seen throughout the forefoot. The flexor and extensor tendons are intact. The plantar fascia  is intact. However there is increased signal and thickening seen within the middle cord of the plantar fascia with adjacent calcaneal enthesophyte. Soft tissues There is diffuse subcutaneous edema seen the heel pad. There is question of focal soft tissue swelling seen on the plantar base the fifth metatarsal head. No loculated fluid collection or free air. IMPRESSION: Focal soft tissue swelling seen on the plantar base beneath the fifth metatarsal head with probable reactive marrow within the fifth metatarsal head. No definite evidence of osteomyelitis. However somewhat limited due to patient motion. Electronically Signed   By: Jonna Clark M.D.   On: 09/23/2020 21:44   US Venous Img Lower Bilateral (DVT)  Result Date: 09/24/2020 CLINICAL DATA:  Bilateral leg pain EXAM: BILATERAL LOWER EXTREMITY VENOUS DOPPLER ULTRASOUND TECHNIQUE: Gray-scale sonography with compression, as well as color and duplex ultrasound, were performed to evaluate the deep venous system(s) from the level of the common femoral vein through the popliteal and proximal calf veins. COMPARISON:  None. FINDINGS: VENOUS Normal compressibility of the common femoral, superficial femoral, and popliteal veins, as well as the visualized calf veins of the left lower extremity. Right below-knee amputation has been performed. Visualized portions of profunda femoral vein and great saphenous vein unremarkable. No filling defects to suggest DVT on grayscale or color Doppler imaging. Doppler waveforms show normal direction of venous flow, normal respiratory plasticity and response to augmentation. OTHER None. Limitations: none IMPRESSION: Negative. Electronically Signed   By: Helyn Numbers MD   On: 09/24/2020 03:27   DG  Foot 2 Views Left  Result Date: 09/23/2020 CLINICAL DATA:  Soft tissue wound with redness EXAM: LEFT FOOT - 2 VIEW COMPARISON:  None. FINDINGS: Frontal and lateral views were obtained. There is generalized soft tissue swelling. No soft tissue air. No fracture or dislocation. There is spurring in the dorsal midfoot region. Other joint spaces appear unremarkable. No erosive change or bony destruction. There are prominent posterior and inferior calcaneal spurs. There is calcification in much of the posterior plantar fascia. IMPRESSION: Soft tissue swelling without soft tissue air. No erosion or bony destruction. Spurring dorsal midfoot. No fracture or dislocation. Prominent calcaneal spurs. There is plantar fascia calcification. Electronically Signed   By: Bretta Bang III M.D.   On: 09/23/2020 08:14        Scheduled Meds: . aspirin  162 mg Oral Daily  . collagenase   Topical Daily  . enoxaparin (LOVENOX) injection  1 mg/kg Subcutaneous Q24H  . gabapentin  300-600 mg Oral QHS  . insulin aspart  0-20 Units Subcutaneous TID WC  . insulin aspart  0-5 Units Subcutaneous QHS  . insulin regular human CONCENTRATED  25 Units Subcutaneous Q supper  . insulin regular human CONCENTRATED  35 Units Subcutaneous Q breakfast  . oxybutynin  10 mg Oral QHS  . traZODone  100 mg Oral QHS   Continuous Infusions: . sodium chloride Stopped (09/24/20 0035)  . sodium chloride 20 mL/hr at 09/24/20 0634  .  ceFAZolin (ANCEF) IV Stopped (09/23/20 2352)  . ceFEPime (MAXIPIME) IV Stopped (09/24/20 0704)  . vancomycin       LOS: 1 day    Time spent: 33 mins    Charise Killian, MD [facility] Pager [phone]  If 7PM-7AM, please contact night-coverage 09/24/2020, 8:29 AM

## 2020-09-24 NOTE — ED Notes (Signed)
This RN observed pt resting in bed with eyes closed, rise and fall of chest noted. Bed locked and low, call light in reach, purewik intact and draining dark yellow urine.

## 2020-09-24 NOTE — Progress Notes (Addendum)
Date of Admission:  09/23/2020       Subjective: Pt in bed without any clothes Because of constant moving of her left leg she has dislodged the purewic and has wet the bed Also her clothes, sheets are all of  She does not have any fever, chest pain, son Had angio today Medications:  . atorvastatin  40 mg Oral Daily  . buPROPion  300 mg Oral Daily  . clopidogrel  75 mg Oral Daily  . collagenase   Topical Daily  . DULoxetine  60 mg Oral Daily  . enoxaparin (LOVENOX) injection  1 mg/kg Subcutaneous Q24H  . gabapentin  300 mg Oral QHS  . heparin sodium (porcine)      . insulin aspart  0-20 Units Subcutaneous TID WC  . insulin aspart  0-5 Units Subcutaneous QHS  . insulin regular human CONCENTRATED  25 Units Subcutaneous Q supper  . insulin regular human CONCENTRATED  35 Units Subcutaneous Q breakfast  . midazolam      . oxybutynin  10 mg Oral QHS  . traZODone  100 mg Oral QHS    Objective: Awake and alert Left carotid scar Constantly moves left leg Chest b/l air entry Hss1s2 abd soft obese, Rt BKA Left foot wound       Patient Vitals for the past 24 hrs:  BP Temp Temp src Pulse Resp SpO2 Height Weight  09/24/20 1217 132/75 99.8 F (37.7 C) Oral (!) 101 -- 94 % -- --  09/24/20 1115 130/71 -- -- (!) 116 16 92 % -- --  09/24/20 1100 138/82 -- -- (!) 105 20 91 % -- --  09/24/20 1045 126/84 -- -- (!) 111 16 93 % -- --  09/24/20 1043 (!) 141/116 -- -- (!) 110 15 -- -- --  09/24/20 1005 -- -- -- -- -- 95 % -- --  09/24/20 0945 -- -- -- -- -- 96 % -- --  09/24/20 0940 -- -- -- -- -- 98 % -- --  09/24/20 0935 -- -- -- -- -- 97 % -- --  09/24/20 0930 -- -- -- -- -- 97 % -- --  09/24/20 0925 134/61 -- -- (!) 101 19 95 % -- --  09/24/20 0923 134/61 -- -- (!) 105 20 99 % -- --  09/24/20 0838 -- 99.5 F (37.5 C) -- -- -- -- -- --  09/24/20 0827 140/76 -- -- (!) 106 18 93 % 5\' 4"  (1.626 m) 117.9 kg  09/24/20 0633 132/76 -- -- (!) 110 (!) 23 93 % -- --  09/24/20 0329  126/80 -- -- (!) 107 16 94 % -- --  09/24/20 0004 -- 99 F (37.2 C) Oral -- -- -- -- --  09/23/20 2202 (!) 132/98 -- -- 95 (!) 22 95 % -- --  09/23/20 1800 111/61 -- -- (!) 123 16 98 % -- --     Objective: Awake and alert Constantly moves left leg Chest b/l air entry Hss1s2 abd soft obese, Rt BKA Left foot wound dressing not removed   CNS non focal  Lab Results Recent Labs    09/23/20 0707 09/24/20 0321  WBC 17.1* 13.0*  HGB 11.3* 10.2*  HCT 35.6* 33.7*  NA 133* 133*  K 4.0 3.9  CL 99 99  CO2 24 24  BUN 16 16  CREATININE 0.88 1.02*   Liver Panel Recent Labs    09/23/20 0707 09/24/20 0321  PROT 7.0 6.7  ALBUMIN 3.1* 2.8*  AST 15 21  ALT 19 22  ALKPHOS 103 82  BILITOT 0.9 0.7   Sedimentation Rate No results for input(s): ESRSEDRATE in the last 72 hours. C-Reactive Protein Recent Labs    09/23/20 0711  CRP 3.7*    Microbiology: Micro 1 of 4  BC- strep species 12/29 wound culture- pending  Studies/Results: DG Chest 2 View  Result Date: 09/23/2020 CLINICAL DATA:  Sepsis EXAM: CHEST - 2 VIEW COMPARISON:  October 06, 2008 FINDINGS: Lungs are clear. Heart is upper normal in size with pulmonary vascularity normal. No adenopathy. There is mild degenerative change in the thoracic spine. IMPRESSION: No edema or airspace opacity.  Heart upper normal in size. Electronically Signed   By: Bretta BangWilliam  Woodruff III M.D.   On: 09/23/2020 08:12   CT ANGIO CHEST PE W OR WO CONTRAST  Result Date: 09/24/2020 CLINICAL DATA:  59 year old female with cellulitis. Shortness of breath. EXAM: CT ANGIOGRAPHY CHEST WITH CONTRAST TECHNIQUE: Multidetector CT imaging of the chest was performed using the standard protocol during bolus administration of intravenous contrast. Multiplanar CT image reconstructions and MIPs were obtained to evaluate the vascular anatomy. CONTRAST:  75mL OMNIPAQUE IOHEXOL 350 MG/ML SOLN COMPARISON:  Chest radiographs 09/23/2020.  CTA neck 12/29/2014. CT  Abdomen and Pelvis 09/23/2020, chest CT 08/09/2004. FINDINGS: Cardiovascular: Adequate contrast bolus timing in the pulmonary arterial tree. Mild respiratory motion. No central or saddle embolus. At the hila the bilateral pulmonary arteries appear patent. No convincing filling defect identified to suggest acute pulmonary embolism. Cardiomegaly. No pericardial effusion. Calcified coronary artery atherosclerosis. Calcified aortic atherosclerosis. Mediastinum/Nodes: Negative.  No mediastinal lymphadenopathy. Negative thyroid for age, chronic subcentimeter thyroid nodules and calcification stable since 2016. Lungs/Pleura: Major airways are patent. Mild dependent opacity at both lung bases most resembles atelectasis. No pleural effusion, pulmonary nodule, or inflammatory appearing pulmonary opacity. Upper Abdomen: Negative visible liver, gallbladder, spleen, pancreas, left adrenal gland, renal upper poles and bowel in the upper abdomen. Chronic round low-density right adrenal nodule measures less than 10 Hounsfield units and is stable since 2005 consistent with benign adrenal adenoma. Musculoskeletal: Degenerative changes in the spine. No acute osseous abnormality identified. Review of the MIP images confirms the above findings. IMPRESSION: 1. Negative for acute pulmonary embolus. 2. Mild pulmonary atelectasis. No other acute finding in the chest. 3. Cardiomegaly. Calcified coronary artery and Aortic Atherosclerosis (ICD10-I70.0). 4. Benign right adrenal adenoma. Electronically Signed   By: Odessa FlemingH  Hall M.D.   On: 09/24/2020 04:30   CT ABDOMEN PELVIS W CONTRAST  Result Date: 09/23/2020 CLINICAL DATA:  Abdominal pain EXAM: CT ABDOMEN AND PELVIS WITH CONTRAST TECHNIQUE: Multidetector CT imaging of the abdomen and pelvis was performed using the standard protocol following bolus administration of intravenous contrast. CONTRAST:  100mL OMNIPAQUE IOHEXOL 300 MG/ML  SOLN COMPARISON:  CT chest 2005 FINDINGS: Lower chest:  Bibasilar atelectasis. Hepatobiliary: No focal liver abnormality is seen. No gallstones, gallbladder wall thickening, or biliary dilatation. Pancreas: Unremarkable. Spleen: Unremarkable. Adrenals/Urinary Tract: There is a 3.4 x 2.8 cm lesion of the right adrenal, which was also present on a 2005 chest CT and is therefore likely benign. Two 3 mm nonobstructing calculi of the lower pole of the right kidney. Small cyst of the interpolar right kidney. Partially distended bladder is unremarkable. Stomach/Bowel: Stomach is within normal limits. Bowel is normal in caliber. Normal appendix. Vascular/Lymphatic: Aortoiliac atherosclerosis. No enlarged lymph nodes identified. There are nonspecific small para-aortic retroperitoneal nodes with adjacent fat infiltration. Reproductive: Uterus and bilateral adnexa are unremarkable. Other: No ascites.  No acute abnormality  of the abdominal wall. Musculoskeletal: Advanced degenerative changes of the included spine. No acute osseous abnormality. IMPRESSION: No acute abnormality. Small nonobstructing right renal calculi. Aortic atherosclerosis. Electronically Signed   By: Guadlupe Spanish M.D.   On: 09/23/2020 09:39   MR FOOT LEFT WO CONTRAST  Result Date: 09/23/2020 CLINICAL DATA:  Swelling, diabetic osteomyelitis EXAM: MRI OF THE LEFT FOOT WITHOUT CONTRAST TECHNIQUE: Multiplanar, multisequence MR imaging of the left was performed. No intravenous contrast was administered. COMPARISON:  None. FINDINGS: The study is somewhat limited due to patient motion. Bones/Joint/Cartilage No areas of bony erosion cortical destruction are seen. There is mildly increased signal seen within the fifth metatarsal head without associated T1 hypointensity. Ligaments The Lisfranc ligaments are intact. Muscles and Tendons Mild fatty atrophy with increased feathery signal seen throughout the forefoot. The flexor and extensor tendons are intact. The plantar fascia is intact. However there is increased  signal and thickening seen within the middle cord of the plantar fascia with adjacent calcaneal enthesophyte. Soft tissues There is diffuse subcutaneous edema seen the heel pad. There is question of focal soft tissue swelling seen on the plantar base the fifth metatarsal head. No loculated fluid collection or free air. IMPRESSION: Focal soft tissue swelling seen on the plantar base beneath the fifth metatarsal head with probable reactive marrow within the fifth metatarsal head. No definite evidence of osteomyelitis. However somewhat limited due to patient motion. Electronically Signed   By: Jonna Clark M.D.   On: 09/23/2020 21:44   PERIPHERAL VASCULAR CATHETERIZATION  Result Date: 09/24/2020 See op note  US Venous Img Lower Bilateral (DVT)  Result Date: 09/24/2020 CLINICAL DATA:  Bilateral leg pain EXAM: BILATERAL LOWER EXTREMITY VENOUS DOPPLER ULTRASOUND TECHNIQUE: Gray-scale sonography with compression, as well as color and duplex ultrasound, were performed to evaluate the deep venous system(s) from the level of the common femoral vein through the popliteal and proximal calf veins. COMPARISON:  None. FINDINGS: VENOUS Normal compressibility of the common femoral, superficial femoral, and popliteal veins, as well as the visualized calf veins of the left lower extremity. Right below-knee amputation has been performed. Visualized portions of profunda femoral vein and great saphenous vein unremarkable. No filling defects to suggest DVT on grayscale or color Doppler imaging. Doppler waveforms show normal direction of venous flow, normal respiratory plasticity and response to augmentation. OTHER None. Limitations: none IMPRESSION: Negative. Electronically Signed   By: Helyn Numbers MD   On: 09/24/2020 03:27   DG Foot 2 Views Left  Result Date: 09/23/2020 CLINICAL DATA:  Soft tissue wound with redness EXAM: LEFT FOOT - 2 VIEW COMPARISON:  None. FINDINGS: Frontal and lateral views were obtained. There is  generalized soft tissue swelling. No soft tissue air. No fracture or dislocation. There is spurring in the dorsal midfoot region. Other joint spaces appear unremarkable. No erosive change or bony destruction. There are prominent posterior and inferior calcaneal spurs. There is calcification in much of the posterior plantar fascia. IMPRESSION: Soft tissue swelling without soft tissue air. No erosion or bony destruction. Spurring dorsal midfoot. No fracture or dislocation. Prominent calcaneal spurs. There is plantar fascia calcification. Electronically Signed   By: Bretta Bang III M.D.   On: 09/23/2020 08:14   Assessment/Plan:  DM with left foot infected wound- superficial- MRI no evidence of osteo No surrounding cellulitis On vanco and cefepime Wound culture pending  Fever- resolved  1of 4 streptococcus bacteremia- could be a pathogen or contaminant. Will ask lab to speciate it  Peripheral vascular disease-  undernet angio today and had PTA of left ATA, Left above knee popliteal artery, and the entire SFA, Stent placement X 2 to the SFA  Anemia  DM- poorly controlled  Obstructive sleep apnea- needs CPAP machine.  Restless left leg- ? Akathisia ( as per [facility] note) has to see neurologist as Op  Continue vanco and cefepime Await cultures to finalize Will then decide on PO antibiotic  Discussed the management with the patient and her nurse

## 2020-09-25 DIAGNOSIS — B955 Unspecified streptococcus as the cause of diseases classified elsewhere: Secondary | ICD-10-CM

## 2020-09-25 DIAGNOSIS — N179 Acute kidney failure, unspecified: Secondary | ICD-10-CM

## 2020-09-25 DIAGNOSIS — L089 Local infection of the skin and subcutaneous tissue, unspecified: Secondary | ICD-10-CM | POA: Diagnosis not present

## 2020-09-25 DIAGNOSIS — R7881 Bacteremia: Secondary | ICD-10-CM

## 2020-09-25 DIAGNOSIS — I739 Peripheral vascular disease, unspecified: Secondary | ICD-10-CM | POA: Diagnosis not present

## 2020-09-25 DIAGNOSIS — E11628 Type 2 diabetes mellitus with other skin complications: Secondary | ICD-10-CM | POA: Diagnosis not present

## 2020-09-25 LAB — CBC
HCT: 33.4 % — ABNORMAL LOW (ref 36.0–46.0)
Hemoglobin: 10.4 g/dL — ABNORMAL LOW (ref 12.0–15.0)
MCH: 26.5 pg (ref 26.0–34.0)
MCHC: 31.1 g/dL (ref 30.0–36.0)
MCV: 85 fL (ref 80.0–100.0)
Platelets: 187 10*3/uL (ref 150–400)
RBC: 3.93 MIL/uL (ref 3.87–5.11)
RDW: 15.2 % (ref 11.5–15.5)
WBC: 13.3 10*3/uL — ABNORMAL HIGH (ref 4.0–10.5)
nRBC: 0 % (ref 0.0–0.2)

## 2020-09-25 LAB — HIV ANTIBODY (ROUTINE TESTING W REFLEX): HIV Screen 4th Generation wRfx: NONREACTIVE

## 2020-09-25 LAB — GLUCOSE, CAPILLARY
Glucose-Capillary: 240 mg/dL — ABNORMAL HIGH (ref 70–99)
Glucose-Capillary: 346 mg/dL — ABNORMAL HIGH (ref 70–99)
Glucose-Capillary: 307 mg/dL — ABNORMAL HIGH (ref 70–99)
Glucose-Capillary: 346 mg/dL — ABNORMAL HIGH (ref 70–99)

## 2020-09-25 LAB — BASIC METABOLIC PANEL
Anion gap: 7 (ref 5–15)
BUN: 14 mg/dL (ref 6–20)
CO2: 27 mmol/L (ref 22–32)
Calcium: 8.2 mg/dL — ABNORMAL LOW (ref 8.9–10.3)
Chloride: 106 mmol/L (ref 98–111)
Creatinine, Ser: 1 mg/dL (ref 0.44–1.00)
GFR, Estimated: >60 mL/min (ref >60)
Glucose, Bld: 240 mg/dL — ABNORMAL HIGH (ref 70–99)
Potassium: 3.6 mmol/L (ref 3.5–5.1)
Sodium: 140 mmol/L (ref 135–145)

## 2020-09-25 LAB — PROCALCITONIN: Procalcitonin: 3.42 ng/mL

## 2020-09-25 LAB — MAGNESIUM: Magnesium: 1.7 mg/dL (ref 1.7–2.4)

## 2020-09-25 MED ORDER — SODIUM CHLORIDE 0.9 % IV SOLN
3.0000 g | Freq: Four times a day (QID) | INTRAVENOUS | Status: DC
  Administered 2020-09-25: 3 g via INTRAVENOUS
  Filled 2020-09-25 (×3): qty 8
  Filled 2020-09-25: qty 3
  Filled 2020-09-25: qty 8

## 2020-09-25 MED ORDER — SODIUM CHLORIDE 0.9 % IV SOLN
INTRAVENOUS | Status: DC | PRN
  Administered 2020-09-25 (×2): 500 mL via INTRAVENOUS
  Administered 2020-09-25: 1000 mL via INTRAVENOUS

## 2020-09-25 MED ORDER — ORITAVANCIN DIPHOSPHATE 400 MG IV SOLR
1200.0000 mg | Freq: Once | INTRAVENOUS | Status: AC
  Administered 2020-09-25: 1200 mg via INTRAVENOUS
  Filled 2020-09-25: qty 120

## 2020-09-25 MED ORDER — AMOXICILLIN-POT CLAVULANATE 875-125 MG PO TABS: 1.0000 | ORAL_TABLET | Freq: Two times a day (BID) | ORAL | 0 refills | Status: AC

## 2020-09-25 MED ORDER — COLLAGENASE 250 UNIT/GM EX OINT: TOPICAL_OINTMENT | Freq: Every day | CUTANEOUS | 0 refills | Status: DC

## 2020-09-25 NOTE — Progress Notes (Signed)
PROGRESS NOTE    [name]  XBJ:[id number] DOB: April 21, 1961 DOA: 09/23/2020 PCP: Sandrea Hughs, NP    Assessment & Plan:   Active Problems:   Cellulitis   Diabetic foot infection (HCC)  LLE recurrent complicated soft tissue infection: associated with infected non healing diabetic foot ulcer. Continue on IV vanco & cefepime as per ID. S/p wound debridement of left foot 5th metatarsal phalangeal joint on 09/23/20 as per podiatry. MRI of left foot shows no definite evidence of osteomyelitis. S/p stents placed, angioplasty as per vascular surg. Vascular surg, ID, podiatry recs apprec.  Sepsis: meets criteria w/ fever, tachypnea, elevated WBCs & LLE infection. Continue on IV abxs.    Leukocytosis: reactive vs infection. Continue on IV abxs   Normocytic anemia: H&H are stable. Will continue to monitor   PVD: s/p right BKA. Continue on plavix, statin. 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 Continue on regular insulin, SSI w/ accuchecks  AKI: Cr is trending down. Baseline Cr/GFR is unknown.   CAD: continue on home dose of statin, plavix   OSA: continue on CPAP   Mood Disorder: unknown type or severity. Continue on home dose of bupropion, duloxetine.   Morbid Obesity: BMI 44.6. Complicates overall care and prognosis. Needs significant weight loss   DVT prophylaxis: lovenox Code Status: full  Family Communication:  Disposition Plan: depends on PT/OT recs (not consulted yet)   Status is: Inpatient  Remains inpatient appropriate because:Ongoing diagnostic testing needed not appropriate for outpatient work up, Unsafe d/c plan, IV treatments appropriate due to intensity of illness or inability to take PO and Inpatient level of care appropriate due to severity of illness   Dispo: The patient is from: Home              Anticipated d/c is to: SNF vs home health               Anticipated d/c  date is: 3 days              Patient currently is not medically stable to d/c.        Consultants:   ID  Vascular surg  Podiatry    Procedures:  Ultrasound guidance for vascular access right femoral artery 2. Catheter placement into left common femoral artery from right femoral approach 3. Aortogram and selective left lower extremity angiogram 4. Percutaneous transluminal angioplasty of left anterior tibial artery with 4 mm diameter Lutonix drug-coated angioplasty balloon 5.  Percutaneous transluminal angioplasty of the left above-knee popliteal artery and the entire SFA with two 5 mm diameter by 22 cm length Lutonix drug-coated angioplasty balloons             6.  Viabahn stent placement x2 to the left SFA and above-knee popliteal arteries with 6 mm diameter by 25 cm length stent and 6 mm diameter by 15 cm length stent             7.  Life star stent placement to the left external iliac artery with 9 mm diameter by 6 cm length stent 8. StarClose closure device right femoral artery    Antimicrobials:    Subjective: Pt c/o depression  Objective: Vitals:   09/24/20 1217 09/24/20 1643 09/24/20 1915 09/25/20 0354  BP: 132/75 (!) 149/60 128/81 (!) 164/90  Pulse: (!) 101 83 71 (!) 110  Resp:   18 19  Temp: 99.8 F (  37.7 C) (!) 100.7 F (38.2 C) 98.6 F (37 C) 97.6 F (36.4 C)  TempSrc: Oral Oral    SpO2: 94% 97% 90% 93%  Weight:    117.9 kg  Height:        Intake/Output Summary (Last 24 hours) at 09/25/2020 0742 Last data filed at 09/25/2020 0355 Gross per 24 hour  Intake 340.62 ml  Output 500 ml  Net -159.38 ml   Filed Weights   09/23/20 0708 09/24/20 0827 09/25/20 0354  Weight: 117.9 kg 117.9 kg 117.9 kg    Examination:  General exam: Appears calm & comfortable  Respiratory system: clear breath sounds b/l. No rales  Cardiovascular system: S1/S2+. No rubs or clicks  Gastrointestinal  system: Abdomen is obese soft and nontender.  Normal bowel sounds heard. Central nervous system: Alert and oriented.  Extremities:  R BKA & LLE is dressed & dressing is C/D/I.  Psychiatry: Judgement and insight appear normal. Flat mood and affect    Data Reviewed: I have personally reviewed following labs and imaging studies  CBC: Recent Labs  Lab 09/23/20 0707 09/24/20 0321 09/25/20 0419  WBC 17.1* 13.0* 13.3*  NEUTROABS 15.6*  --   --   HGB 11.3* 10.2* 10.4*  HCT 35.6* 33.7* 33.4*  MCV 83.4 85.3 85.0  PLT 256 198 187   Basic Metabolic Panel: Recent Labs  Lab 09/23/20 0707 09/23/20 1508 09/24/20 0321 09/25/20 0419  NA 133*  --  133* 140  K 4.0  --  3.9 3.6  CL 99  --  99 106  CO2 24  --  24 27  GLUCOSE 350*  --  306* 240*  BUN 16  --  16 14  CREATININE 0.88  --  1.02* 1.00  CALCIUM 8.9  --  8.2* 8.2*  MG  --  1.3*  --   --    GFR: Estimated Creatinine Clearance: 75.6 mL/min (by C-G formula based on SCr of 1 mg/dL). Liver Function Tests: Recent Labs  Lab 09/23/20 0707 09/24/20 0321  AST 15 21  ALT 19 22  ALKPHOS 103 82  BILITOT 0.9 0.7  PROT 7.0 6.7  ALBUMIN 3.1* 2.8*   No results for input(s): LIPASE, AMYLASE in the last 168 hours. No results for input(s): AMMONIA in the last 168 hours. Coagulation Profile: Recent Labs  Lab 09/23/20 0707  INR 1.1   Cardiac Enzymes: Recent Labs  Lab 09/23/20 0710  CKTOTAL 90   BNP (last 3 results) No results for input(s): PROBNP in the last 8760 hours. HbA1C: Recent Labs    09/23/20 0710  HGBA1C 8.6*   CBG: Recent Labs  Lab 09/24/20 0836 09/24/20 1114 09/24/20 1218 09/24/20 1645 09/24/20 1917  GLUCAP 262* 267* 263* 240* 292*   Lipid Profile: No results for input(s): CHOL, HDL, LDLCALC, TRIG, CHOLHDL, LDLDIRECT in the last 72 hours. Thyroid Function Tests: Recent Labs    09/23/20 0710  TSH 1.335   Anemia Panel: No results for input(s): VITAMINB12, FOLATE, FERRITIN, TIBC, IRON, RETICCTPCT in  the last 72 hours. Sepsis Labs: Recent Labs  Lab 09/23/20 0707 09/23/20 0710 09/23/20 1127 09/24/20 0321 09/25/20 0419  PROCALCITON  --  0.21  --  4.95 3.42  LATICACIDVEN 1.8  --  1.6  --   --     Recent Results (from the past 240 hour(s))  Culture, blood (Routine x 2)     Status: None (Preliminary result)   Collection Time: 09/23/20  7:10 AM   Specimen: BLOOD RIGHT ARM  Result Value Ref Range Status   Specimen Description BLOOD RIGHT ARM  Final   Special Requests   Final    BOTTLES DRAWN AEROBIC AND ANAEROBIC Blood Culture results may not be optimal due to an excessive volume of blood received in culture bottles   Culture   Final    NO GROWTH 2 DAYS Performed at [facility], [address], [place], [place] [address]    Report Status PENDING  Incomplete  Culture, blood (Routine x 2)     Status: None (Preliminary result)   Collection Time: 09/23/20  7:11 AM   Specimen: BLOOD  Result Value Ref Range Status   Specimen Description BLOOD  Final   Special Requests   Final    BOTTLES DRAWN AEROBIC AND ANAEROBIC Blood Culture adequate volume   Culture  Setup Time   Final    Organism ID to follow GRAM POSITIVE COCCI ANAEROBIC BOTTLE ONLY CRITICAL RESULT CALLED TO, READ BACK BY AND VERIFIED WITH: JASON ROBINS @2059  ON 09/23/20 SKL Performed at [facility], [address] [place], [place]    Culture GRAM POSITIVE COCCI  Final   Report Status PENDING  Incomplete  Blood Culture ID Panel (Reflexed)     Status: Abnormal   Collection Time: 09/23/20  7:11 AM  Result Value Ref Range Status   Enterococcus faecalis NOT DETECTED NOT DETECTED Final   Enterococcus Faecium NOT DETECTED NOT DETECTED Final   Listeria monocytogenes NOT DETECTED NOT DETECTED Final   Staphylococcus species NOT DETECTED NOT DETECTED Final   Staphylococcus aureus (BCID) NOT DETECTED NOT DETECTED Final   Staphylococcus epidermidis NOT DETECTED NOT DETECTED Final   Staphylococcus  lugdunensis NOT DETECTED NOT DETECTED Final   Streptococcus species DETECTED (A) NOT DETECTED Final    Comment: Not Enterococcus species, Streptococcus agalactiae, Streptococcus pyogenes, or Streptococcus pneumoniae. CRITICAL RESULT CALLED TO, READ BACK BY AND VERIFIED WITH: JASON ROBINS @2059  ON 09/23/20 SKL    Streptococcus agalactiae NOT DETECTED NOT DETECTED Final   Streptococcus pneumoniae NOT DETECTED NOT DETECTED Final   Streptococcus pyogenes NOT DETECTED NOT DETECTED Final   A.calcoaceticus-baumannii NOT DETECTED NOT DETECTED Final   Bacteroides fragilis NOT DETECTED NOT DETECTED Final   Enterobacterales NOT DETECTED NOT DETECTED Final   Enterobacter cloacae complex NOT DETECTED NOT DETECTED Final   Escherichia coli NOT DETECTED NOT DETECTED Final   Klebsiella aerogenes NOT DETECTED NOT DETECTED Final   Klebsiella oxytoca NOT DETECTED NOT DETECTED Final   Klebsiella pneumoniae NOT DETECTED NOT DETECTED Final   Proteus species NOT DETECTED NOT DETECTED Final   Salmonella species NOT DETECTED NOT DETECTED Final   Serratia marcescens NOT DETECTED NOT DETECTED Final   Haemophilus influenzae NOT DETECTED NOT DETECTED Final   Neisseria meningitidis NOT DETECTED NOT DETECTED Final   Pseudomonas aeruginosa NOT DETECTED NOT DETECTED Final   Stenotrophomonas maltophilia NOT DETECTED NOT DETECTED Final   Candida albicans NOT DETECTED NOT DETECTED Final   Candida auris NOT DETECTED NOT DETECTED Final   Candida glabrata NOT DETECTED NOT DETECTED Final   Candida krusei NOT DETECTED NOT DETECTED Final   Candida parapsilosis NOT DETECTED NOT DETECTED Final   Candida tropicalis NOT DETECTED NOT DETECTED Final   Cryptococcus neoformans/gattii NOT DETECTED NOT DETECTED Final    Comment: Performed at [facility], [address] [place], [address] [place]  Resp Panel by RT-PCR (Flu A&B, Covid) Nasopharyngeal Swab     Status: None   Collection Time: 09/23/20 11:27 AM  Specimen:  Nasopharyngeal Swab; Nasopharyngeal(NP) swabs in vial transport medium  Result Value Ref Range Status   SARS Coronavirus 2 by RT PCR NEGATIVE NEGATIVE Final    Comment: (NOTE) SARS-CoV-2 target nucleic acids are NOT DETECTED.  The SARS-CoV-2 RNA is generally detectable in upper respiratory specimens during the acute phase of infection. The lowest concentration of SARS-CoV-2 viral copies this assay can detect is 138 copies/mL. A negative result does not preclude SARS-Cov-2 infection and should not be used as the sole basis for treatment or other patient management decisions. A negative result may occur with  improper specimen collection/handling, submission of specimen other than nasopharyngeal swab, presence of viral mutation(s) within the areas targeted by this assay, and inadequate number of viral copies(<138 copies/mL). A negative result must be combined with clinical observations, patient history, and epidemiological information. The expected result is Negative.  Fact Sheet for Patients:  BloggerCourse.com  Fact Sheet for Healthcare Providers:  SeriousBroker.it  This test is no t yet approved or cleared by the [place] FDA and  has been authorized for detection and/or diagnosis of SARS-CoV-2 by FDA under an Emergency Use Authorization (EUA). This EUA will remain  in effect (meaning this test can be used) for the duration of the COVID-19 declaration under Section 564(b)(1) of the Act, 21 U.S.C.section 360bbb-3(b)(1), unless the authorization is terminated  or revoked sooner.       Influenza A by PCR NEGATIVE NEGATIVE Final   Influenza B by PCR NEGATIVE NEGATIVE Final    Comment: (NOTE) The Xpert Xpress SARS-CoV-2/FLU/RSV plus assay is intended as an aid in the diagnosis of influenza from Nasopharyngeal swab specimens and should not be used as a sole basis for treatment. Nasal washings and aspirates are unacceptable for  Xpert Xpress SARS-CoV-2/FLU/RSV testing.  Fact Sheet for Patients: BloggerCourse.com  Fact Sheet for Healthcare Providers: SeriousBroker.it  This test is not yet approved or cleared by the [place] FDA and has been authorized for detection and/or diagnosis of SARS-CoV-2 by FDA under an Emergency Use Authorization (EUA). This EUA will remain in effect (meaning this test can be used) for the duration of the COVID-19 declaration under Section 564(b)(1) of the Act, 21 U.S.C. section 360bbb-3(b)(1), unless the authorization is terminated or revoked.  Performed at [facility], [address] [place], [place] [address]   Urine culture     Status: None   Collection Time: 09/23/20  2:13 PM   Specimen: Urine, Random  Result Value Ref Range Status   Specimen Description   Final    URINE, RANDOM Performed at [facility], [address], [place], [place] [address]    Special Requests   Final    NONE Performed at [facility], [address] [place], [place] [address]    Culture   Final    NO GROWTH Performed at [facility], [address], [place], [place] [address]    Report Status 09/24/2020 FINAL  Final  Aerobic/Anaerobic Culture (surgical/deep wound)     Status: None (Preliminary result)   Collection Time: 09/23/20  3:08 PM   Specimen: Wound  Result Value Ref Range Status   Specimen Description   Final    WOUND Performed at [facility], [address], [place], [place] [address]    Special Requests   Final    FOOT LEFT Performed at [facility], [address], [place], [place] [address]    Gram Stain   Final  RARE WBC PRESENT, PREDOMINANTLY PMN NO ORGANISMS SEEN    Culture   Final    TOO YOUNG TO READ Performed at [facility], [address], [place], [place] [address]    Report Status PENDING  Incomplete         Radiology Studies: DG  Chest 2 View  Result Date: 09/23/2020 CLINICAL DATA:  Sepsis EXAM: CHEST - 2 VIEW COMPARISON:  October 06, 2008 FINDINGS: Lungs are clear. Heart is upper normal in size with pulmonary vascularity normal. No adenopathy. There is mild degenerative change in the thoracic spine. IMPRESSION: No edema or airspace opacity.  Heart upper normal in size. Electronically Signed   By: Bretta Bang III M.D.   On: 09/23/2020 08:12   CT ANGIO CHEST PE W OR WO CONTRAST  Result Date: 09/24/2020 CLINICAL DATA:  59 year old female with cellulitis. Shortness of breath. EXAM: CT ANGIOGRAPHY CHEST WITH CONTRAST TECHNIQUE: Multidetector CT imaging of the chest was performed using the standard protocol during bolus administration of intravenous contrast. Multiplanar CT image reconstructions and MIPs were obtained to evaluate the vascular anatomy. CONTRAST:  51mL OMNIPAQUE IOHEXOL 350 MG/ML SOLN COMPARISON:  Chest radiographs 09/23/2020.  CTA neck 12/29/2014. CT Abdomen and Pelvis 09/23/2020, chest CT 08/09/2004. FINDINGS: Cardiovascular: Adequate contrast bolus timing in the pulmonary arterial tree. Mild respiratory motion. No central or saddle embolus. At the hila the bilateral pulmonary arteries appear patent. No convincing filling defect identified to suggest acute pulmonary embolism. Cardiomegaly. No pericardial effusion. Calcified coronary artery atherosclerosis. Calcified aortic atherosclerosis. Mediastinum/Nodes: Negative.  No mediastinal lymphadenopathy. Negative thyroid for age, chronic subcentimeter thyroid nodules and calcification stable since 2016. Lungs/Pleura: Major airways are patent. Mild dependent opacity at both lung bases most resembles atelectasis. No pleural effusion, pulmonary nodule, or inflammatory appearing pulmonary opacity. Upper Abdomen: Negative visible liver, gallbladder, spleen, pancreas, left adrenal gland, renal upper poles and bowel in the upper abdomen. Chronic round low-density right adrenal  nodule measures less than 10 Hounsfield units and is stable since 2005 consistent with benign adrenal adenoma. Musculoskeletal: Degenerative changes in the spine. No acute osseous abnormality identified. Review of the MIP images confirms the above findings. IMPRESSION: 1. Negative for acute pulmonary embolus. 2. Mild pulmonary atelectasis. No other acute finding in the chest. 3. Cardiomegaly. Calcified coronary artery and Aortic Atherosclerosis (ICD10-I70.0). 4. Benign right adrenal adenoma. Electronically Signed   By: Odessa Fleming M.D.   On: 09/24/2020 04:30   CT ABDOMEN PELVIS W CONTRAST  Result Date: 09/23/2020 CLINICAL DATA:  Abdominal pain EXAM: CT ABDOMEN AND PELVIS WITH CONTRAST TECHNIQUE: Multidetector CT imaging of the abdomen and pelvis was performed using the standard protocol following bolus administration of intravenous contrast. CONTRAST:  OMNIPAQUE IOHEXOL 300 MG/ML  SOLN COMPARISON:  CT chest 2005 FINDINGS: Lower chest: Bibasilar atelectasis. Hepatobiliary: No focal liver abnormality is seen. No gallstones, gallbladder wall thickening, or biliary dilatation. Pancreas: Unremarkable. Spleen: Unremarkable. Adrenals/Urinary Tract: There is a 3.4 x 2.8 cm lesion of the right adrenal, which was also present on a 2005 chest CT and is therefore likely benign. Two 3 mm nonobstructing calculi of the lower pole of the right kidney. Small cyst of the interpolar right kidney. Partially distended bladder is unremarkable. Stomach/Bowel: Stomach is within normal limits. Bowel is normal in caliber. Normal appendix. Vascular/Lymphatic: Aortoiliac atherosclerosis. No enlarged lymph nodes identified. There are nonspecific small para-aortic retroperitoneal nodes with adjacent fat infiltration. Reproductive: Uterus and bilateral adnexa are unremarkable. Other: No ascites.  No acute abnormality of the  abdominal wall. Musculoskeletal: Advanced degenerative changes of the included spine. No acute osseous abnormality.  IMPRESSION: No acute abnormality. Small nonobstructing right renal calculi. Aortic atherosclerosis. Electronically Signed   By: Guadlupe Spanish M.D.   On: 09/23/2020 09:39   MR FOOT LEFT WO CONTRAST  Result Date: 09/23/2020 CLINICAL DATA:  Swelling, diabetic osteomyelitis EXAM: MRI OF THE LEFT FOOT WITHOUT CONTRAST TECHNIQUE: Multiplanar, multisequence MR imaging of the left was performed. No intravenous contrast was administered. COMPARISON:  None. FINDINGS: The study is somewhat limited due to patient motion. Bones/Joint/Cartilage No areas of bony erosion cortical destruction are seen. There is mildly increased signal seen within the fifth metatarsal head without associated T1 hypointensity. Ligaments The Lisfranc ligaments are intact. Muscles and Tendons Mild fatty atrophy with increased feathery signal seen throughout the forefoot. The flexor and extensor tendons are intact. The plantar fascia is intact. However there is increased signal and thickening seen within the middle cord of the plantar fascia with adjacent calcaneal enthesophyte. Soft tissues There is diffuse subcutaneous edema seen the heel pad. There is question of focal soft tissue swelling seen on the plantar base the fifth metatarsal head. No loculated fluid collection or free air. IMPRESSION: Focal soft tissue swelling seen on the plantar base beneath the fifth metatarsal head with probable reactive marrow within the fifth metatarsal head. No definite evidence of osteomyelitis. However somewhat limited due to patient motion. Electronically Signed   By: Jonna Clark M.D.   On: 09/23/2020 21:44   PERIPHERAL VASCULAR CATHETERIZATION  Result Date: 09/24/2020 See op note  US Venous Img Lower Bilateral (DVT)  Result Date: 09/24/2020 CLINICAL DATA:  Bilateral leg pain EXAM: BILATERAL LOWER EXTREMITY VENOUS DOPPLER ULTRASOUND TECHNIQUE: Gray-scale sonography with compression, as well as color and duplex ultrasound, were performed to evaluate  the deep venous system(s) from the level of the common femoral vein through the popliteal and proximal calf veins. COMPARISON:  None. FINDINGS: VENOUS Normal compressibility of the common femoral, superficial femoral, and popliteal veins, as well as the visualized calf veins of the left lower extremity. Right below-knee amputation has been performed. Visualized portions of profunda femoral vein and great saphenous vein unremarkable. No filling defects to suggest DVT on grayscale or color Doppler imaging. Doppler waveforms show normal direction of venous flow, normal respiratory plasticity and response to augmentation. OTHER None. Limitations: none IMPRESSION: Negative. Electronically Signed   By: Helyn Numbers MD   On: 09/24/2020 03:27   DG Foot 2 Views Left  Result Date: 09/23/2020 CLINICAL DATA:  Soft tissue wound with redness EXAM: LEFT FOOT - 2 VIEW COMPARISON:  None. FINDINGS: Frontal and lateral views were obtained. There is generalized soft tissue swelling. No soft tissue air. No fracture or dislocation. There is spurring in the dorsal midfoot region. Other joint spaces appear unremarkable. No erosive change or bony destruction. There are prominent posterior and inferior calcaneal spurs. There is calcification in much of the posterior plantar fascia. IMPRESSION: Soft tissue swelling without soft tissue air. No erosion or bony destruction. Spurring dorsal midfoot. No fracture or dislocation. Prominent calcaneal spurs. There is plantar fascia calcification. Electronically Signed   By: Bretta Bang III M.D.   On: 09/23/2020 08:14        Scheduled Meds: . atorvastatin  40 mg Oral Daily  . buPROPion  300 mg Oral Daily  . clopidogrel  75 mg Oral Daily  . collagenase   Topical Daily  . DULoxetine  60 mg Oral Daily  . enoxaparin (LOVENOX) injection  1 mg/kg Subcutaneous Q24H  . gabapentin  300 mg Oral QHS  . insulin aspart  0-20 Units Subcutaneous TID WC  . insulin aspart  0-5 Units  Subcutaneous QHS  . insulin regular human CONCENTRATED  25 Units Subcutaneous Q supper  . insulin regular human CONCENTRATED  35 Units Subcutaneous Q breakfast  . oxybutynin  10 mg Oral QHS  . traZODone  100 mg Oral QHS   Continuous Infusions: . sodium chloride 500 mL (09/25/20 0541)  . ceFEPime (MAXIPIME) IV 2 g (09/25/20 0542)  . vancomycin 750 mg (09/25/20 0646)     LOS: 2 days    Time spent: 30 mins    Charise Killian, MD [facility] Pager [phone]  If 7PM-7AM, please contact night-coverage 09/25/2020, 7:42 AM

## 2020-09-25 NOTE — Progress Notes (Signed)
Wound dressing education and teach back completed at bedside with patient and boyfriend on FaceTime. Patient prepared to go home with wound dressing supplies. Daughter to pick patient up at discharge. Patient was made aware by ID she must follow up with podiatry as well as control diabetes to further decrease chances of infection. Patient agrees to comply with discharge plans and education.

## 2020-09-25 NOTE — Progress Notes (Signed)
Pharmacy Antibiotic Note  [name] is a 59 y.o. female admitted on 09/23/2020 with sepsis. Pt presented with fever and chills x12-24h. PMH includes anemia, anxiety, arthritis, CHF, COPD, CAD, diabetes, fibromyalgia, GERD, HTN, OSA, and PVD s/p R BKA. Pt was recently admitted to [facility] from 12/4-12/11 for L foot cellulitis due to infected laceration. Pt was initially treated with clindamycin >> vancomycin >> doxycycline through 12/13 with improvement in cellulitis. Pharmacy has been consulted for vancomycin and cefepime dosing for a LLE recurrent complicated soft tissue infection associated with infected non-healing diabetic foot ulcer with early sepsis without shock. Pt being started on broad spectrum antibiotics due to second hospitalization in 2 weeks for IV antibiotics.   12/29 MRI: No definite evidence of osteomyelitis  12/30: Pt received 2500 LD on 12/29 @ ~1130. Pt scheduled to received 750 mg q12h starting 12/30 @ 0600 - does not appear that pt received this dose per review. Pt was given an additional LD of 2500 mg as it had been over 30 hours since previous dose given.   Scr stable ~ 1, WBC 14.9>17.1>13.0>13.3, 24h Tmax: 100.7  Day 3 abx  Plan: Continue Vancomycin 750 mg IV every 12 hours.   Goal trough 15-20 mcg/mL.  Plan to draw a level prior to 4th dose (09/27/19 @ 0500 AM) Continue cefepime 2g q8h Monitor renal function with AM labs  Height: 5\' 4"  (162.6 cm) Weight: 117.9 kg (259 lb 14.4 oz) IBW/kg (Calculated) : 54.7  Temp (24hrs), Avg:99.2 F (37.3 C), Min:97.6 F (36.4 C), Max:100.7 F (38.2 C)  Recent Labs  Lab 09/23/20 0707 09/23/20 1127 09/24/20 0321 09/25/20 0419  WBC 17.1*  --  13.0* 13.3*  CREATININE 0.88  --  1.02*  --   LATICACIDVEN 1.8 1.6  --   --     Estimated Creatinine Clearance: 74.1 mL/min (A) (by C-G formula based on SCr of 1.02 mg/dL (H)).    Allergies  Allergen Reactions  . Niacin And Related Nausea And Vomiting    Antimicrobials this  admission: 12/29 cefepime >> 12/29 metronidazole x 1 12/29 vancomycin >>  Microbiology results: 12/29 BCx: 1/4 bottles + streptococcus species - organism not identified  12/29 UCx: pending 12/29 wound cx: pending  Thank you for allowing pharmacy to be a part of this patient's care.  1/30, PharmD Pharmacy Resident  09/25/2020 6:19 AM

## 2020-09-25 NOTE — Discharge Summary (Addendum)
Physician Discharge Summary  [name]:[id number] DOB: 1961-06-10 DOA: 09/23/2020  PCP: Sandrea Hughs, NP  Admit date: 09/23/2020 Discharge date: 09/25/2020  Admitted From: home Disposition:  home  Recommendations for Outpatient Follow-up:  1. Follow up with PCP in 1-2 weeks 2. F/u podiatry, Dr. Excell Seltzer, in 1-2 weeks 3. F/u ID, Dr. Rivka Safer, in 1-2 weeks  Home Health: Equipment/Devices:  Discharge Condition: stable  CODE STATUS: full  Diet recommendation: Heart Healthy / Carb Modified   Brief/Interim Summary: HPI was taken from Dr. Maisie Fus: [name] is a 59 y.o. female with medical history significant of  of anemia, anxiety, arthritis, CHF, COPD, CAD, diabetes, fibromyalgia, gastric reflux, hypertension,CAD,OSA,PVD s/p right BKA, with history of recent admission to [facility] 12/4-12/11/21 for left foot cellulitis s due to infected laceration.She was initially placed on clindamycin in ED, transitioned to Vancomycin with improvement in cellulitis. Transitioned to Doxycycline to continue through 12/13, as well as continue local wound care on discharge. Patient now presents to ed with history of fever/chills/n/v x 12-24 hours.She also noted continued pain of left foot.  ED Course:  Vitals:102.5, bp 140/79, hr 127, rr 20 Labs: na 133,glu 350, cr 0.88,  Wbc 17.1, hgb 11.3 close to base of 12 cxr:No edema or airspace opacity. Heart upper normal in size.  Foot xray:Soft tissue swelling without soft tissue air. No erosion or bony destruction. Spurring dorsal midfoot. No fracture or dislocation. Prominent calcaneal spurs. There is plantar fascia calcification.  CT abd/pelvis: No acute abnormality. small nonobstructing right renal calculi. Aortic atherosclerosis.   Tx: vanc/cefepime   Hospital Course from Dr. Mayford Knife 12/30-12/31/21: Pt was found to have a recurrent left diabetic foot ulcer. Pt initially received IV abxs, vanco & cefepime as per ID and was transitioned  to po augmentin at d/c. Pt had wound debridement of left foot 5th metatarsal phalangeal joint as per podiatry. Also, pt had stents placed & angioplasty done as per vascular surgery. For more information, please see previous progress/consult notes.   Discharge Diagnoses:  Active Problems:   Cellulitis   Diabetic foot infection (HCC)  LLE recurrentcomplicated soft tissue infection: associated with infected non healing diabetic foot ulcer. Continue on IV vanco & cefepime while inpatient and transition to po augmentin at d/c. S/p wound debridement of left foot 5th metatarsal phalangeal joint on 09/23/20 as per podiatry. MRI of left foot shows no definite evidence of osteomyelitis. S/p stents placed, angioplasty as per vascular surg. Vascular surg, ID, podiatry recs apprec.  Sepsis: meets criteria w/ fever, tachypnea, elevated WBCs & LLE infection. Continue on IV abxs.    Bacteremia: strept group G. Abxs as per ID.   Leukocytosis: reactive vs infection. Continue on IV abxs   Normocytic anemia: H&H are stable. Will continue to monitor   PVD: s/p right BKA. Continue on plavix, statin. 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 Continue on regular insulin, SSI w/ accuchecks  AKI: Cr is trending down. Baseline Cr/GFR is unknown.   CAD: continue on home dose of statin, plavix   OSA: continue on CPAP   Mood Disorder: unknown type or severity. Continue on home dose of bupropion, duloxetine.   Morbid Obesity: BMI 44.6. Complicates overall care and prognosis. Needs significant weight loss   Discharge Instructions  Discharge Instructions    Diet - low sodium heart healthy   Complete by: As directed    Diet Carb Modified  Complete by: As directed    Discharge instructions   Complete by: As directed    F/u podiatry, Dr. Excell Seltzer, in 1-2 weeks. F/u w/ ID, Dr. Rivka Safer, in 1-2 weeks. F/u w/ PCP in 1-2  weeks. Partial weightbearing in a surgical shoe at this time to left foot.   Discharge wound care:   Complete by: As directed    Daily      Comments:To cleanse left foot wound with NS and pat dry  Apply Santyl to wound per podiatry order. Cover with NS moist gauze and secure with dry gauze and kerlix and tape daily   Increase activity slowly   Complete by: As directed      Allergies as of 09/25/2020      Reactions   Niacin And Related Nausea And Vomiting      Medication List    TAKE these medications   amoxicillin-clavulanate 875-125 MG tablet Commonly known as: Augmentin Take 1 tablet by mouth 2 (two) times daily for 10 days.   atorvastatin 40 MG tablet Commonly known as: LIPITOR Take 40 mg by mouth daily.   buPROPion 300 MG 24 hr tablet Commonly known as: WELLBUTRIN XL Take 300 mg by mouth daily. Pt taking 300 mg by mouth daily What changed: Another medication with the same name was removed. Continue taking this medication, and follow the directions you see here.   cetirizine 10 MG tablet Commonly known as: ZYRTEC Take 10 mg by mouth daily.   clopidogrel 75 MG tablet Commonly known as: PLAVIX Take 75 mg by mouth daily.   collagenase ointment Commonly known as: SANTYL Apply topically daily. Start taking on: September 26, 2020   DULoxetine 60 MG capsule Commonly known as: CYMBALTA Take 60 mg by mouth daily.   enalapril 5 MG tablet Commonly known as: VASOTEC Take 5 mg by mouth daily.   gabapentin 300 MG capsule Commonly known as: NEURONTIN Take 300-600 mg by mouth at bedtime.   HumuLIN R U-500 KwikPen 500 UNIT/ML kwikpen Generic drug: insulin regular human CONCENTRATED Inject 50-70 Units into the skin See admin instructions. Inject 70u under the skin every morning before breakfast and inject 50u under the skin every evening before supper   metFORMIN 500 MG 24 hr tablet Commonly known as: GLUCOPHAGE-XR Take 500 mg by mouth daily with supper.   oxybutynin 10  MG 24 hr tablet Commonly known as: DITROPAN-XL Take 10 mg by mouth daily.   pantoprazole 40 MG tablet Commonly known as: PROTONIX Take 40 mg by mouth daily.   traZODone 50 MG tablet Commonly known as: DESYREL Take 100 mg by mouth at bedtime.   Trulicity 0.75 MG/0.5ML Sopn Generic drug: Dulaglutide Inject 0.75 mg into the skin every Tuesday.            Discharge Care Instructions  (From admission, onward)         Start     Ordered   09/25/20 0000  Discharge wound care:       Comments: Daily      Comments:To cleanse left foot wound with NS and pat dry  Apply Santyl to wound per podiatry order. Cover with NS moist gauze and secure with dry gauze and kerlix and tape daily   09/25/20 1657          Follow-up Information    [facility]. Schedule an appointment as soon as possible for a visit.   Specialty: Wound Care Contact information: [address] [phone] ar  [phone]       Rosetta Posner, DPM. Schedule an appointment as soon as possible for a visit in 2 week(s).   Specialty: Podiatry Contact information: [address] [place] [address] [phone]              Allergies  Allergen Reactions  . Niacin And Related Nausea And Vomiting    Consultations: Podiatry ID Vascular surgery   Procedures/Studies: DG Chest 2 View  Result Date: 09/23/2020 CLINICAL DATA:  Sepsis EXAM: CHEST - 2 VIEW COMPARISON:  October 06, 2008 FINDINGS: Lungs are clear. Heart is upper normal in size with pulmonary vascularity normal. No adenopathy. There is mild degenerative change in the thoracic spine. IMPRESSION: No edema or airspace opacity.  Heart upper normal in size. Electronically Signed   By: Bretta Bang III M.D.   On: 09/23/2020 08:12   CT ANGIO CHEST PE W OR WO CONTRAST  Result Date: 09/24/2020 CLINICAL DATA:  58 year old female with cellulitis. Shortness of breath. EXAM: CT ANGIOGRAPHY CHEST  WITH CONTRAST TECHNIQUE: Multidetector CT imaging of the chest was performed using the standard protocol during bolus administration of intravenous contrast. Multiplanar CT image reconstructions and MIPs were obtained to evaluate the vascular anatomy. CONTRAST:  75mL OMNIPAQUE IOHEXOL 350 MG/ML SOLN COMPARISON:  Chest radiographs 09/23/2020.  CTA neck 12/29/2014. CT Abdomen and Pelvis 09/23/2020, chest CT 08/09/2004. FINDINGS: Cardiovascular: Adequate contrast bolus timing in the pulmonary arterial tree. Mild respiratory motion. No central or saddle embolus. At the hila the bilateral pulmonary arteries appear patent. No convincing filling defect identified to suggest acute pulmonary embolism. Cardiomegaly. No pericardial effusion. Calcified coronary artery atherosclerosis. Calcified aortic atherosclerosis. Mediastinum/Nodes: Negative.  No mediastinal lymphadenopathy. Negative thyroid for age, chronic subcentimeter thyroid nodules and calcification stable since 2016. Lungs/Pleura: Major airways are patent. Mild dependent opacity at both lung bases most resembles atelectasis. No pleural effusion, pulmonary nodule, or inflammatory appearing pulmonary opacity. Upper Abdomen: Negative visible liver, gallbladder, spleen, pancreas, left adrenal gland, renal upper poles and bowel in the upper abdomen. Chronic round low-density right adrenal nodule measures less than 10 Hounsfield units and is stable since 2005 consistent with benign adrenal adenoma. Musculoskeletal: Degenerative changes in the spine. No acute osseous abnormality identified. Review of the MIP images confirms the above findings. IMPRESSION: 1. Negative for acute pulmonary embolus. 2. Mild pulmonary atelectasis. No other acute finding in the chest. 3. Cardiomegaly. Calcified coronary artery and Aortic Atherosclerosis (ICD10-I70.0). 4. Benign right adrenal adenoma. Electronically Signed   By: Odessa Fleming M.D.   On: 09/24/2020 04:30   CT ABDOMEN PELVIS W  CONTRAST  Result Date: 09/23/2020 CLINICAL DATA:  Abdominal pain EXAM: CT ABDOMEN AND PELVIS WITH CONTRAST TECHNIQUE: Multidetector CT imaging of the abdomen and pelvis was performed using the standard protocol following bolus administration of intravenous contrast. CONTRAST:  OMNIPAQUE IOHEXOL 300 MG/ML  SOLN COMPARISON:  CT chest 2005 FINDINGS: Lower chest: Bibasilar atelectasis. Hepatobiliary: No focal liver abnormality is seen. No gallstones, gallbladder wall thickening, or biliary dilatation. Pancreas: Unremarkable. Spleen: Unremarkable. Adrenals/Urinary Tract: There is a 3.4 x 2.8 cm lesion of the right adrenal, which was also present on a 2005 chest CT and is therefore likely benign. Two 3 mm nonobstructing calculi of the lower pole of the right kidney. Small cyst of the interpolar right kidney. Partially distended bladder is unremarkable. Stomach/Bowel: Stomach is within normal limits. Bowel is normal in caliber. Normal appendix. Vascular/Lymphatic: Aortoiliac atherosclerosis. No enlarged lymph nodes identified. There are nonspecific small para-aortic retroperitoneal nodes  with adjacent fat infiltration. Reproductive: Uterus and bilateral adnexa are unremarkable. Other: No ascites.  No acute abnormality of the abdominal wall. Musculoskeletal: Advanced degenerative changes of the included spine. No acute osseous abnormality. IMPRESSION: No acute abnormality. Small nonobstructing right renal calculi. Aortic atherosclerosis. Electronically Signed   By: Guadlupe Spanish M.D.   On: 09/23/2020 09:39   MR FOOT LEFT WO CONTRAST  Result Date: 09/23/2020 CLINICAL DATA:  Swelling, diabetic osteomyelitis EXAM: MRI OF THE LEFT FOOT WITHOUT CONTRAST TECHNIQUE: Multiplanar, multisequence MR imaging of the left was performed. No intravenous contrast was administered. COMPARISON:  None. FINDINGS: The study is somewhat limited due to patient motion. Bones/Joint/Cartilage No areas of bony erosion cortical  destruction are seen. There is mildly increased signal seen within the fifth metatarsal head without associated T1 hypointensity. Ligaments The Lisfranc ligaments are intact. Muscles and Tendons Mild fatty atrophy with increased feathery signal seen throughout the forefoot. The flexor and extensor tendons are intact. The plantar fascia is intact. However there is increased signal and thickening seen within the middle cord of the plantar fascia with adjacent calcaneal enthesophyte. Soft tissues There is diffuse subcutaneous edema seen the heel pad. There is question of focal soft tissue swelling seen on the plantar base the fifth metatarsal head. No loculated fluid collection or free air. IMPRESSION: Focal soft tissue swelling seen on the plantar base beneath the fifth metatarsal head with probable reactive marrow within the fifth metatarsal head. No definite evidence of osteomyelitis. However somewhat limited due to patient motion. Electronically Signed   By: Jonna Clark M.D.   On: 09/23/2020 21:44   PERIPHERAL VASCULAR CATHETERIZATION  Result Date: 09/24/2020 See op note  US Venous Img Lower Bilateral (DVT)  Result Date: 09/24/2020 CLINICAL DATA:  Bilateral leg pain EXAM: BILATERAL LOWER EXTREMITY VENOUS DOPPLER ULTRASOUND TECHNIQUE: Gray-scale sonography with compression, as well as color and duplex ultrasound, were performed to evaluate the deep venous system(s) from the level of the common femoral vein through the popliteal and proximal calf veins. COMPARISON:  None. FINDINGS: VENOUS Normal compressibility of the common femoral, superficial femoral, and popliteal veins, as well as the visualized calf veins of the left lower extremity. Right below-knee amputation has been performed. Visualized portions of profunda femoral vein and great saphenous vein unremarkable. No filling defects to suggest DVT on grayscale or color Doppler imaging. Doppler waveforms show normal direction of venous flow, normal  respiratory plasticity and response to augmentation. OTHER None. Limitations: none IMPRESSION: Negative. Electronically Signed   By: Helyn Numbers MD   On: 09/24/2020 03:27   DG Foot 2 Views Left  Result Date: 09/23/2020 CLINICAL DATA:  Soft tissue wound with redness EXAM: LEFT FOOT - 2 VIEW COMPARISON:  None. FINDINGS: Frontal and lateral views were obtained. There is generalized soft tissue swelling. No soft tissue air. No fracture or dislocation. There is spurring in the dorsal midfoot region. Other joint spaces appear unremarkable. No erosive change or bony destruction. There are prominent posterior and inferior calcaneal spurs. There is calcification in much of the posterior plantar fascia. IMPRESSION: Soft tissue swelling without soft tissue air. No erosion or bony destruction. Spurring dorsal midfoot. No fracture or dislocation. Prominent calcaneal spurs. There is plantar fascia calcification. Electronically Signed   By: Bretta Bang III M.D.   On: 09/23/2020 08:14       Subjective: Pt c/o depression    Discharge Exam: Vitals:   09/25/20 1235 09/25/20 1636  BP: 118/73 (!) 161/84  Pulse: 98 (!) 107  Resp: 18 18  Temp: 98.9 F (37.2 C) 98.8 F (37.1 C)  SpO2: 98% 99%   Vitals:   09/25/20 0354 09/25/20 0754 09/25/20 1235 09/25/20 1636  BP: (!) 164/90 (!) 159/90 118/73 (!) 161/84  Pulse: (!) 110 (!) 106 98 (!) 107  Resp: 19 18 18 18   Temp: 97.6 F (36.4 C) 97.9 F (36.6 C) 98.9 F (37.2 C) 98.8 F (37.1 C)  TempSrc:   Oral Oral  SpO2: 93% 93% 98% 99%  Weight: 117.9 kg     Height:         General exam: Appears calm & comfortable  Respiratory system: clear breath sounds b/l. No rales  Cardiovascular system: S1/S2+. No rubs or clicks  Gastrointestinal system: Abdomen is obese soft and nontender.  Normal bowel sounds heard. Central nervous system: Alert and oriented.  Extremities:  R BKA & LLE is dressed & dressing is C/D/I.  Psychiatry: Judgement and insight  appear normal. Flat mood and affect    The results of significant diagnostics from this hospitalization (including imaging, microbiology, ancillary and laboratory) are listed below for reference.     Microbiology: Recent Results (from the past 240 hour(s))  Culture, blood (Routine x 2)     Status: None (Preliminary result)   Collection Time: 09/23/20  7:10 AM   Specimen: BLOOD RIGHT ARM  Result Value Ref Range Status   Specimen Description BLOOD RIGHT ARM  Final   Special Requests   Final    BOTTLES DRAWN AEROBIC AND ANAEROBIC Blood Culture results may not be optimal due to an excessive volume of blood received in culture bottles   Culture   Final    NO GROWTH 2 DAYS Performed at [facility], [address], [place], [place]    Report Status PENDING  Incomplete  Culture, blood (Routine x 2)     Status: Abnormal (Preliminary result)   Collection Time: 09/23/20  7:11 AM   Specimen: BLOOD  Result Value Ref Range Status   Specimen Description   Final    BLOOD Performed at [facility], [address], [place], [place]    Special Requests   Final    BOTTLES DRAWN AEROBIC AND ANAEROBIC Blood Culture adequate volume Performed at [facility], [address] [place], [place]    Culture  Setup Time   Final    GRAM POSITIVE COCCI ANAEROBIC BOTTLE ONLY CRITICAL RESULT CALLED TO, READ BACK BY AND VERIFIED WITH: JASON ROBINS @2059  ON 09/23/20 SKL    Culture (A)  Final    STREPTOCOCCUS GROUP G SUSCEPTIBILITIES TO FOLLOW Performed at [facility], [address] [place], [address] [place]    Report Status PENDING  Incomplete  Blood Culture ID Panel (Reflexed)     Status: Abnormal   Collection Time: 09/23/20  7:11 AM  Result Value Ref Range Status   Enterococcus faecalis NOT DETECTED NOT DETECTED Final   Enterococcus Faecium NOT DETECTED NOT DETECTED Final   Listeria monocytogenes NOT DETECTED NOT DETECTED Final    Staphylococcus species NOT DETECTED NOT DETECTED Final   Staphylococcus aureus (BCID) NOT DETECTED NOT DETECTED Final   Staphylococcus epidermidis NOT DETECTED NOT DETECTED Final   Staphylococcus lugdunensis NOT DETECTED NOT DETECTED Final   Streptococcus species DETECTED (A) NOT DETECTED Final    Comment: Not Enterococcus species, Streptococcus agalactiae, Streptococcus pyogenes, or Streptococcus pneumoniae. CRITICAL RESULT CALLED TO, READ BACK BY AND VERIFIED WITH: JASON ROBINS @2059  ON 09/23/20  SKL    Streptococcus agalactiae NOT DETECTED NOT DETECTED Final   Streptococcus pneumoniae NOT DETECTED NOT DETECTED Final   Streptococcus pyogenes NOT DETECTED NOT DETECTED Final   A.calcoaceticus-baumannii NOT DETECTED NOT DETECTED Final   Bacteroides fragilis NOT DETECTED NOT DETECTED Final   Enterobacterales NOT DETECTED NOT DETECTED Final   Enterobacter cloacae complex NOT DETECTED NOT DETECTED Final   Escherichia coli NOT DETECTED NOT DETECTED Final   Klebsiella aerogenes NOT DETECTED NOT DETECTED Final   Klebsiella oxytoca NOT DETECTED NOT DETECTED Final   Klebsiella pneumoniae NOT DETECTED NOT DETECTED Final   Proteus species NOT DETECTED NOT DETECTED Final   Salmonella species NOT DETECTED NOT DETECTED Final   Serratia marcescens NOT DETECTED NOT DETECTED Final   Haemophilus influenzae NOT DETECTED NOT DETECTED Final   Neisseria meningitidis NOT DETECTED NOT DETECTED Final   Pseudomonas aeruginosa NOT DETECTED NOT DETECTED Final   Stenotrophomonas maltophilia NOT DETECTED NOT DETECTED Final   Candida albicans NOT DETECTED NOT DETECTED Final   Candida auris NOT DETECTED NOT DETECTED Final   Candida glabrata NOT DETECTED NOT DETECTED Final   Candida krusei NOT DETECTED NOT DETECTED Final   Candida parapsilosis NOT DETECTED NOT DETECTED Final   Candida tropicalis NOT DETECTED NOT DETECTED Final   Cryptococcus neoformans/gattii NOT DETECTED NOT DETECTED Final    Comment: Performed at  [facility], [address] [place], [place] [address]  Resp Panel by RT-PCR (Flu A&B, Covid) Nasopharyngeal Swab     Status: None   Collection Time: 09/23/20 11:27 AM   Specimen: Nasopharyngeal Swab; Nasopharyngeal(NP) swabs in vial transport medium  Result Value Ref Range Status   SARS Coronavirus 2 by RT PCR NEGATIVE NEGATIVE Final    Comment: (NOTE) SARS-CoV-2 target nucleic acids are NOT DETECTED.  The SARS-CoV-2 RNA is generally detectable in upper respiratory specimens during the acute phase of infection. The lowest concentration of SARS-CoV-2 viral copies this assay can detect is 138 copies/mL. A negative result does not preclude SARS-Cov-2 infection and should not be used as the sole basis for treatment or other patient management decisions. A negative result may occur with  improper specimen collection/handling, submission of specimen other than nasopharyngeal swab, presence of viral mutation(s) within the areas targeted by this assay, and inadequate number of viral copies(<138 copies/mL). A negative result must be combined with clinical observations, patient history, and epidemiological information. The expected result is Negative.  Fact Sheet for Patients:  BloggerCourse.comhttps://www.fda.gov/media/152166/download  Fact Sheet for Healthcare Providers:  SeriousBroker.ithttps://www.fda.gov/media/152162/download  This test is no t yet approved or cleared by the [place] FDA and  has been authorized for detection and/or diagnosis of SARS-CoV-2 by FDA under an Emergency Use Authorization (EUA). This EUA will remain  in effect (meaning this test can be used) for the duration of the COVID-19 declaration under Section 564(b)(1) of the Act, 21 U.S.C.section 360bbb-3(b)(1), unless the authorization is terminated  or revoked sooner.       Influenza A by PCR NEGATIVE NEGATIVE Final   Influenza B by PCR NEGATIVE NEGATIVE Final    Comment: (NOTE) The Xpert Xpress SARS-CoV-2/FLU/RSV plus assay  is intended as an aid in the diagnosis of influenza from Nasopharyngeal swab specimens and should not be used as a sole basis for treatment. Nasal washings and aspirates are unacceptable for Xpert Xpress SARS-CoV-2/FLU/RSV testing.  Fact Sheet for Patients: BloggerCourse.comhttps://www.fda.gov/media/152166/download  Fact Sheet for Healthcare Providers: SeriousBroker.ithttps://www.fda.gov/media/152162/download  This test is not yet approved or cleared by the [place] FDA and  has been authorized for detection and/or diagnosis of SARS-CoV-2 by FDA under an Emergency Use Authorization (EUA). This EUA will remain in effect (meaning this test can be used) for the duration of the COVID-19 declaration under Section 564(b)(1) of the Act, 21 U.S.C. section 360bbb-3(b)(1), unless the authorization is terminated or revoked.  Performed at [facility], [address], [place], [place] [address]   Urine culture     Status: None   Collection Time: 09/23/20  2:13 PM   Specimen: Urine, Random  Result Value Ref Range Status   Specimen Description   Final    URINE, RANDOM Performed at [facility], [address], [place], [place] [address]    Special Requests   Final    NONE Performed at [facility], [address] [place], [place] [address]    Culture   Final    NO GROWTH Performed at [facility], [address], [place], [place] [address]    Report Status 09/24/2020 FINAL  Final  Aerobic/Anaerobic Culture (surgical/deep wound)     Status: None (Preliminary result)   Collection Time: 09/23/20  3:08 PM   Specimen: Wound  Result Value Ref Range Status   Specimen Description   Final    WOUND Performed at [facility], [address], [place], [place] [address]    Special Requests   Final    FOOT LEFT Performed at [facility], [address] [place], [place] [address]    Gram Stain   Final    RARE WBC PRESENT, PREDOMINANTLY PMN NO ORGANISMS  SEEN Performed at [facility], [address], [place], [place] [address]    Culture   Final    CULTURE REINCUBATED FOR BETTER GROWTH RARE STREPTOCOCCUS GROUP G Beta hemolytic streptococci are predictably susceptible to penicillin and other beta lactams. Susceptibility testing not routinely performed. NO ANAEROBES ISOLATED; CULTURE IN PROGRESS FOR 5 DAYS    Report Status PENDING  Incomplete     Labs: BNP (last 3 results) No results for input(s): BNP in the last 8760 hours. Basic Metabolic Panel: Recent Labs  Lab 09/23/20 0707 09/23/20 1508 09/24/20 0321 09/25/20 0419  NA 133*  --  133* 140  K 4.0  --  3.9 3.6  CL 99  --  99 106  CO2 24  --  24 27  GLUCOSE 350*  --  306* 240*  BUN 16  --  16 14  CREATININE 0.88  --  1.02* 1.00  CALCIUM 8.9  --  8.2* 8.2*  MG  --  1.3*  --  1.7   Liver Function Tests: Recent Labs  Lab 09/23/20 0707 09/24/20 0321  AST 15 21  ALT 19 22  ALKPHOS 103 82  BILITOT 0.9 0.7  PROT 7.0 6.7  ALBUMIN 3.1* 2.8*   No results for input(s): LIPASE, AMYLASE in the last 168 hours. No results for input(s): AMMONIA in the last 168 hours. CBC: Recent Labs  Lab 09/23/20 0707 09/24/20 0321 09/25/20 0419  WBC 17.1* 13.0* 13.3*  NEUTROABS 15.6*  --   --   HGB 11.3* 10.2* 10.4*  HCT 35.6* 33.7* 33.4*  MCV 83.4 85.3 85.0  PLT 256 198 187   Cardiac Enzymes: Recent Labs  Lab 09/23/20 0710  CKTOTAL 90   BNP: Invalid input(s): POCBNP CBG: Recent Labs  Lab 09/24/20 1645 09/24/20 1917 09/25/20 0802 09/25/20 1231 09/25/20 1637  GLUCAP 240* 292* 240* 346* 307*   D-Dimer No  results for input(s): DDIMER in the last 72 hours. Hgb A1c Recent Labs    09/23/20 0710  HGBA1C 8.6*   Lipid Profile No results for input(s): CHOL, HDL, LDLCALC, TRIG, CHOLHDL, LDLDIRECT in the last 72 hours. Thyroid function studies Recent Labs    09/23/20 0710  TSH 1.335   Anemia work up No results for input(s): VITAMINB12, FOLATE, FERRITIN, TIBC,  IRON, RETICCTPCT in the last 72 hours. Urinalysis    Component Value Date/Time   COLORURINE YELLOW (A) 09/23/2020 1413   APPEARANCEUR HAZY (A) 09/23/2020 1413   APPEARANCEUR Clear 01/24/2014 1456   LABSPEC 1.039 (H) 09/23/2020 1413   LABSPEC 1.006 01/24/2014 1456   PHURINE 5.0 09/23/2020 1413   GLUCOSEU >=500 (A) 09/23/2020 1413   GLUCOSEU Negative 01/24/2014 1456   HGBUR SMALL (A) 09/23/2020 1413   BILIRUBINUR NEGATIVE 09/23/2020 1413   BILIRUBINUR Negative 01/24/2014 1456   KETONESUR 5 (A) 09/23/2020 1413   PROTEINUR >=300 (A) 09/23/2020 1413   NITRITE POSITIVE (A) 09/23/2020 1413   LEUKOCYTESUR NEGATIVE 09/23/2020 1413   LEUKOCYTESUR Negative 01/24/2014 1456   Sepsis Labs Invalid input(s): PROCALCITONIN,  WBC,  LACTICIDVEN Microbiology Recent Results (from the past 240 hour(s))  Culture, blood (Routine x 2)     Status: None (Preliminary result)   Collection Time: 09/23/20  7:10 AM   Specimen: BLOOD RIGHT ARM  Result Value Ref Range Status   Specimen Description BLOOD RIGHT ARM  Final   Special Requests   Final    BOTTLES DRAWN AEROBIC AND ANAEROBIC Blood Culture results may not be optimal due to an excessive volume of blood received in culture bottles   Culture   Final    NO GROWTH 2 DAYS Performed at [facility], [address] [place], [place] [address]    Report Status PENDING  Incomplete  Culture, blood (Routine x 2)     Status: Abnormal (Preliminary result)   Collection Time: 09/23/20  7:11 AM   Specimen: BLOOD  Result Value Ref Range Status   Specimen Description   Final    BLOOD Performed at [facility], [address], [place], [place] [address]    Special Requests   Final    BOTTLES DRAWN AEROBIC AND ANAEROBIC Blood Culture adequate volume Performed at [facility], [address] [place], [place] [address]    Culture  Setup Time   Final    GRAM POSITIVE COCCI ANAEROBIC BOTTLE ONLY CRITICAL RESULT CALLED TO, READ BACK  BY AND VERIFIED WITH: JASON ROBINS @2059  ON 09/23/20 SKL    Culture (A)  Final    STREPTOCOCCUS GROUP G SUSCEPTIBILITIES TO FOLLOW Performed at [facility], [address], [place], [place] [address]    Report Status PENDING  Incomplete  Blood Culture ID Panel (Reflexed)     Status: Abnormal   Collection Time: 09/23/20  7:11 AM  Result Value Ref Range Status   Enterococcus faecalis NOT DETECTED NOT DETECTED Final   Enterococcus Faecium NOT DETECTED NOT DETECTED Final   Listeria monocytogenes NOT DETECTED NOT DETECTED Final   Staphylococcus species NOT DETECTED NOT DETECTED Final   Staphylococcus aureus (BCID) NOT DETECTED NOT DETECTED Final   Staphylococcus epidermidis NOT DETECTED NOT DETECTED Final   Staphylococcus lugdunensis NOT DETECTED NOT DETECTED Final   Streptococcus species DETECTED (A) NOT DETECTED Final    Comment: Not Enterococcus species, Streptococcus agalactiae, Streptococcus pyogenes, or Streptococcus pneumoniae. CRITICAL RESULT CALLED TO, READ BACK BY AND VERIFIED WITH: JASON ROBINS @2059  ON  09/23/20 SKL    Streptococcus agalactiae NOT DETECTED NOT DETECTED Final   Streptococcus pneumoniae NOT DETECTED NOT DETECTED Final   Streptococcus pyogenes NOT DETECTED NOT DETECTED Final   A.calcoaceticus-baumannii NOT DETECTED NOT DETECTED Final   Bacteroides fragilis NOT DETECTED NOT DETECTED Final   Enterobacterales NOT DETECTED NOT DETECTED Final   Enterobacter cloacae complex NOT DETECTED NOT DETECTED Final   Escherichia coli NOT DETECTED NOT DETECTED Final   Klebsiella aerogenes NOT DETECTED NOT DETECTED Final   Klebsiella oxytoca NOT DETECTED NOT DETECTED Final   Klebsiella pneumoniae NOT DETECTED NOT DETECTED Final   Proteus species NOT DETECTED NOT DETECTED Final   Salmonella species NOT DETECTED NOT DETECTED Final   Serratia marcescens NOT DETECTED NOT DETECTED Final   Haemophilus influenzae NOT DETECTED NOT DETECTED Final   Neisseria meningitidis NOT  DETECTED NOT DETECTED Final   Pseudomonas aeruginosa NOT DETECTED NOT DETECTED Final   Stenotrophomonas maltophilia NOT DETECTED NOT DETECTED Final   Candida albicans NOT DETECTED NOT DETECTED Final   Candida auris NOT DETECTED NOT DETECTED Final   Candida glabrata NOT DETECTED NOT DETECTED Final   Candida krusei NOT DETECTED NOT DETECTED Final   Candida parapsilosis NOT DETECTED NOT DETECTED Final   Candida tropicalis NOT DETECTED NOT DETECTED Final   Cryptococcus neoformans/gattii NOT DETECTED NOT DETECTED Final    Comment: Performed at [facility], [address] [place], [place] [address]  Resp Panel by RT-PCR (Flu A&B, Covid) Nasopharyngeal Swab     Status: None   Collection Time: 09/23/20 11:27 AM   Specimen: Nasopharyngeal Swab; Nasopharyngeal(NP) swabs in vial transport medium  Result Value Ref Range Status   SARS Coronavirus 2 by RT PCR NEGATIVE NEGATIVE Final    Comment: (NOTE) SARS-CoV-2 target nucleic acids are NOT DETECTED.  The SARS-CoV-2 RNA is generally detectable in upper respiratory specimens during the acute phase of infection. The lowest concentration of SARS-CoV-2 viral copies this assay can detect is 138 copies/mL. A negative result does not preclude SARS-Cov-2 infection and should not be used as the sole basis for treatment or other patient management decisions. A negative result may occur with  improper specimen collection/handling, submission of specimen other than nasopharyngeal swab, presence of viral mutation(s) within the areas targeted by this assay, and inadequate number of viral copies(<138 copies/mL). A negative result must be combined with clinical observations, patient history, and epidemiological information. The expected result is Negative.  Fact Sheet for Patients:  BloggerCourse.com  Fact Sheet for Healthcare Providers:  SeriousBroker.it  This test is no t yet approved or cleared  by the [place] FDA and  has been authorized for detection and/or diagnosis of SARS-CoV-2 by FDA under an Emergency Use Authorization (EUA). This EUA will remain  in effect (meaning this test can be used) for the duration of the COVID-19 declaration under Section 564(b)(1) of the Act, 21 U.S.C.section 360bbb-3(b)(1), unless the authorization is terminated  or revoked sooner.       Influenza A by PCR NEGATIVE NEGATIVE Final   Influenza B by PCR NEGATIVE NEGATIVE Final    Comment: (NOTE) The Xpert Xpress SARS-CoV-2/FLU/RSV plus assay is intended as an aid in the diagnosis of influenza from Nasopharyngeal swab specimens and should not be used as a sole basis for treatment. Nasal washings and aspirates are unacceptable for Xpert Xpress SARS-CoV-2/FLU/RSV testing.  Fact Sheet for Patients: BloggerCourse.com  Fact Sheet for Healthcare Providers: SeriousBroker.it  This test is not yet approved or cleared by the [place] FDA  and has been authorized for detection and/or diagnosis of SARS-CoV-2 by FDA under an Emergency Use Authorization (EUA). This EUA will remain in effect (meaning this test can be used) for the duration of the COVID-19 declaration under Section 564(b)(1) of the Act, 21 U.S.C. section 360bbb-3(b)(1), unless the authorization is terminated or revoked.  Performed at [facility], [address] [place], [place] [address]   Urine culture     Status: None   Collection Time: 09/23/20  2:13 PM   Specimen: Urine, Random  Result Value Ref Range Status   Specimen Description   Final    URINE, RANDOM Performed at [facility], [address] [place], [place] [address]    Special Requests   Final    NONE Performed at [facility], [address] [place], [place] [address]    Culture   Final    NO GROWTH Performed at [facility], [address], [place], [place]  [address]    Report Status 09/24/2020 FINAL  Final  Aerobic/Anaerobic Culture (surgical/deep wound)     Status: None (Preliminary result)   Collection Time: 09/23/20  3:08 PM   Specimen: Wound  Result Value Ref Range Status   Specimen Description   Final    WOUND Performed at [facility], [address] [place], [place] [address]    Special Requests   Final    FOOT LEFT Performed at [facility], [address] [place], [place] [address]    Gram Stain   Final    RARE WBC PRESENT, PREDOMINANTLY PMN NO ORGANISMS SEEN Performed at [facility], [address], [place], [place] [address]    Culture   Final    CULTURE REINCUBATED FOR BETTER GROWTH RARE STREPTOCOCCUS GROUP G Beta hemolytic streptococci are predictably susceptible to penicillin and other beta lactams. Susceptibility testing not routinely performed. NO ANAEROBES ISOLATED; CULTURE IN PROGRESS FOR 5 DAYS    Report Status PENDING  Incomplete     Time coordinating discharge: Over 30 minutes  SIGNED:   Charise Killian, MD  [facility] 09/25/2020, 4:58 PM Pager   If 7PM-7AM, please contact night-coverage

## 2020-09-25 NOTE — Progress Notes (Signed)
Patient sitting on bedside waiting on breakfast. This RN fixed Telemetry Leads and assessed patient. Patient requested and provided with fresh ice water and bed and room cleaned up from excess trash lying around. Patient expressed no additional needs at this time. Will continue to reassess.

## 2020-09-25 NOTE — Progress Notes (Signed)
Subjective  - POD #1, s/p angio  Wants to go home   Physical Exam:  Right groin without hematoma      Assessment/Plan:  POD #1  Stable from vascular perspective.  Continue plavix Will have her follow up as an outpatient  Please call with additional questions  Wells Fouad Taul 09/25/2020 4:58 PM --  Vitals:   09/25/20 1235 09/25/20 1636  BP: 118/73 (!) 161/84  Pulse: 98 (!) 107  Resp: 18 18  Temp: 98.9 F (37.2 C) 98.8 F (37.1 C)  SpO2: 98% 99%    Intake/Output Summary (Last 24 hours) at 09/25/2020 1658 Last data filed at 09/25/2020 1520 Gross per 24 hour  Intake 456.48 ml  Output 800 ml  Net -343.52 ml     Laboratory CBC    Component Value Date/Time   WBC 13.3 (H) 09/25/2020 0419   HGB 10.4 (L) 09/25/2020 0419   HGB 10.1 (L) 01/22/2015 1146   HCT 33.4 (L) 09/25/2020 0419   HCT 31.9 (L) 01/22/2015 1146   PLT 187 09/25/2020 0419   PLT 191 01/22/2015 1146    BMET    Component Value Date/Time   NA 140 09/25/2020 0419   NA 137 01/22/2015 1146   K 3.6 09/25/2020 0419   K 4.2 01/22/2015 1146   CL 106 09/25/2020 0419   CL 101 01/22/2015 1146   CO2 27 09/25/2020 0419   CO2 29 01/22/2015 1146   GLUCOSE 240 (H) 09/25/2020 0419   GLUCOSE 171 (H) 01/22/2015 1146   BUN 14 09/25/2020 0419   BUN 12 01/22/2015 1146   CREATININE 1.00 09/25/2020 0419   CREATININE 0.81 01/22/2015 1146   CALCIUM 8.2 (L) 09/25/2020 0419   CALCIUM 9.2 01/22/2015 1146   GFRNONAA >60 09/25/2020 0419   GFRNONAA >60 01/22/2015 1146   GFRAA >60 09/20/2016 1431   GFRAA >60 01/22/2015 1146    COAG Lab Results  Component Value Date   INR 1.1 09/23/2020   INR 1.1 01/22/2015   INR 1.1 08/08/2014   No results found for: PTT  Antibiotics Anti-infectives (From admission, onward)   Start     Dose/Rate Route Frequency Ordered Stop   09/25/20 1730  Oritavancin Diphosphate (ORBACTIV) 1,200 mg in dextrose 5 % IVPB        1,200 mg 333.3 mL/hr over 180 Minutes Intravenous Once  09/25/20 1628     09/25/20 1430  Ampicillin-Sulbactam (UNASYN) 3 g in sodium chloride 0.9 % 100 mL IVPB  Status:  Discontinued        3 g 200 mL/hr over 30 Minutes Intravenous Every 6 hours 09/25/20 1337 09/25/20 1628   09/25/20 0600  vancomycin (VANCOREADY) IVPB 750 mg/150 mL  Status:  Discontinued        750 mg 150 mL/hr over 60 Minutes Intravenous Every 12 hours 09/24/20 1728 09/25/20 1336   09/25/20 0000  amoxicillin-clavulanate (AUGMENTIN) 875-125 MG tablet        1 tablet Oral 2 times daily 09/25/20 1657 10/05/20 2359   09/24/20 1800  vancomycin (VANCOCIN) 2,500 mg in sodium chloride 0.9 % 500 mL IVPB        2,500 mg 250 mL/hr over 120 Minutes Intravenous  Once 09/24/20 1728 09/25/20 0356   09/24/20 0600  vancomycin (VANCOREADY) IVPB 750 mg/150 mL  Status:  Discontinued        750 mg 150 mL/hr over 60 Minutes Intravenous Every 12 hours 09/23/20 1606 09/24/20 1726   09/23/20 2045  ceFAZolin (ANCEF) IVPB 2g/100 mL  premix  Status:  Discontinued        2 g 200 mL/hr over 30 Minutes Intravenous 30 min pre-op 09/23/20 2011 09/24/20 1144   09/23/20 1615  ceFEPIme (MAXIPIME) 2 g in sodium chloride 0.9 % 100 mL IVPB  Status:  Discontinued        2 g 200 mL/hr over 30 Minutes Intravenous Every 8 hours 09/23/20 1604 09/25/20 1336   09/23/20 0900  vancomycin (VANCOREADY) IVPB 1500 mg/300 mL        1,500 mg 150 mL/hr over 120 Minutes Intravenous  Once 09/23/20 0833 09/23/20 1339   09/23/20 0830  ceFEPIme (MAXIPIME) 2 g in sodium chloride 0.9 % 100 mL IVPB        2 g 200 mL/hr over 30 Minutes Intravenous  Once 09/23/20 0827 09/23/20 1117   09/23/20 0830  metroNIDAZOLE (FLAGYL) IVPB 500 mg        500 mg 100 mL/hr over 60 Minutes Intravenous  Once 09/23/20 0827 09/23/20 1117   09/23/20 0830  vancomycin (VANCOCIN) IVPB 1000 mg/200 mL premix        1,000 mg 200 mL/hr over 60 Minutes Intravenous  Once 09/23/20 0827 09/23/20 1305       V. Charlena Cross, M.D., [facility] [place] Office: [phone] Pager:  [phone]

## 2020-09-25 NOTE — Progress Notes (Signed)
ID Pt says she wants to go home Says she is feeling much better and wants to be home for newyears eve She has no fever No nausea or vomiting No chills Sitting in bed and face time with her family  O/e well No distress Chest b/l air entry [id number] Left foot       Labs CBC Latest Ref Rng & Units 09/25/2020 09/24/2020 09/23/2020  WBC 4.0 - 10.5 K/uL 13.3(H) 13.0(H) 17.1(H)  Hemoglobin 12.0 - 15.0 g/dL 10.4(L) 10.2(L) 11.3(L)  Hematocrit 36.0 - 46.0 % 33.4(L) 33.7(L) 35.6(L)  Platelets 150 - 400 K/uL 187 198 256   CMP Latest Ref Rng & Units 09/25/2020 09/24/2020 09/23/2020  Glucose 70 - 99 mg/dL 329(J) 242(A) 834(H)  BUN 6 - 20 mg/dL 14 16 16   Creatinine 0.44 - 1.00 mg/dL 9.62) 2.29(N  Sodium 135 - 145 mmol/L 140 133(L) 133(L)  Potassium 3.5 - 5.1 mmol/L 3.6 3.9 4.0  Chloride 98 - 111 mmol/L 106 99 99  CO2 22 - 32 mmol/L 27 24 24   Calcium 8.9 - 10.3 mg/dL 8.2(L) 8.2(L) 8.9  Total Protein 6.5 - 8.1 g/dL - 6.7 7.0  Total Bilirubin 0.3 - 1.2 mg/dL - 0.7 0.9  Alkaline Phos 38 - 126 U/L - 82 103  AST 15 - 41 U/L - 21 15  ALT 0 - 44 U/L - 22 19    Micro BC 09/23/20 1 of 4 positive for strep G Wound culture strepG   Impression/recommendation  Left foot wound superficial with Group G streptococcus cultured in wound Streptococcus Group G bacteremia- 1 of 4- low bioburden aferbrile > 24 hrs Wbc declining Pt is insisting on going home this evening, says she is very depressed and upset that she is [place] and wants to be with her family. We cannot  arrange for home IV antibiotic or PICc this evening So will give long acting glycopeptide oritavancin 1.2 grams one dose over 3 hour infusion and send her on Po augmentin 875 mg BID for 10 days Linezolid would have been an excellent option but she is on 3 SSRI which increases her risk for serotonin syndrome. Told her to keep the leg elevated and not to get wet Follow up with podiatrist as OP Tight sugar  control Follow up with me as OP Discussed the management with her nurse, hospitalist , phramcist and  podiatrist

## 2020-09-25 NOTE — Progress Notes (Signed)
Skip Mayer admitting MD contacted via secure chat reference Contact Precautions. Labs confirmed and do not support contact precautions at this time. Contact precautions dc'd. MD Mayford Knife and ID Ravishankar asked about contact precautions and advised to ask Infection Protection. Resulted in contacting admitting MD who confirmed to dc contact order.

## 2020-09-25 NOTE — Progress Notes (Signed)
Patient provided with discharge instructions and information. Belongings packed. Patient needs IV removed after antibiotic administration completes, IV removed and CPap machine packed.

## 2020-09-26 LAB — CULTURE, BLOOD (ROUTINE X 2): Special Requests: ADEQUATE

## 2020-09-28 ENCOUNTER — Encounter: Payer: Self-pay | Admitting: Vascular Surgery

## 2020-09-28 LAB — AEROBIC/ANAEROBIC CULTURE W GRAM STAIN (SURGICAL/DEEP WOUND)

## 2020-09-28 LAB — CULTURE, BLOOD (ROUTINE X 2): Culture: NO GROWTH

## 2020-09-30 LAB — CULTURE, BLOOD (ROUTINE X 2)
Culture: NO GROWTH
Culture: NO GROWTH
Special Requests: ADEQUATE
Special Requests: ADEQUATE

## 2020-10-06 ENCOUNTER — Other Ambulatory Visit: Payer: Self-pay

## 2020-10-06 ENCOUNTER — Ambulatory Visit: Payer: Medicaid Other | Attending: Infectious Diseases | Admitting: Infectious Diseases

## 2020-10-06 DIAGNOSIS — B955 Unspecified streptococcus as the cause of diseases classified elsewhere: Secondary | ICD-10-CM

## 2020-10-06 DIAGNOSIS — R7881 Bacteremia: Secondary | ICD-10-CM

## 2020-10-06 DIAGNOSIS — L089 Local infection of the skin and subcutaneous tissue, unspecified: Secondary | ICD-10-CM | POA: Diagnosis not present

## 2020-10-06 NOTE — Progress Notes (Signed)
The purpose of this virtual visit is to provide medical care while limiting exposure to the novel coronavirus (COVID19) for both patient and office staff.   Consent was obtained for phone visit:  Yes.   Answered questions that patient had about telehealth interaction:  Yes.   I discussed the limitations, risks, security and privacy concerns of performing an evaluation and management service by telephone. I also discussed with the patient that there may be a patient responsible charge related to this service. The patient expressed understanding and agreed to proceed.   Patient Location: Home Provider Location:office Only patient and provider on this call This is a follow up visit to the recent hospitalization between 09/23/20-09/25/20 for left  foot infection and bacteremia She had group G streptococcus in wound culture and blood culture. She also had Staphpseudintermedius in wound culture After initial Iv vanco and cefepime she received a dose of IV oritavancin on 09/25/20 a long acting glycopeptide as she wanted to leave . She was sent home on PO augmentin for 10 days and she has completed treatment yesterday- She says she is doing much better.foot is not swollen, no fever, pain much better She is going to see Dr.Baker, podiatrist on 10/08/20 Will discuss with Dr.Baker  regarding further management  Total time spent on the call 7 min

## 2020-10-19 ENCOUNTER — Telehealth: Payer: Self-pay

## 2020-10-19 NOTE — Telephone Encounter (Signed)
Patient left VM that foot is worsening. Advised her she was supposed to see Dr Excell Seltzer. She missed her appt on 10/08/20. Advised her to call them and reschedule that appt.She agrees she will call them and get back on schedule. States she is in great deal of pain and may need more medications. She will call Dr Excell Seltzer and get there or if she developes fever or worsening symptoms will go to ED.

## 2020-10-19 NOTE — Telephone Encounter (Signed)
Thanks

## 2020-10-20 ENCOUNTER — Ambulatory Visit: Payer: Medicaid Other | Admitting: Physician Assistant

## 2020-10-21 ENCOUNTER — Other Ambulatory Visit (INDEPENDENT_AMBULATORY_CARE_PROVIDER_SITE_OTHER): Payer: Self-pay | Admitting: Vascular Surgery

## 2020-10-21 ENCOUNTER — Encounter (INDEPENDENT_AMBULATORY_CARE_PROVIDER_SITE_OTHER): Payer: Medicaid Other

## 2020-10-21 ENCOUNTER — Ambulatory Visit (INDEPENDENT_AMBULATORY_CARE_PROVIDER_SITE_OTHER): Payer: Medicaid Other | Admitting: Nurse Practitioner

## 2020-10-21 DIAGNOSIS — I6523 Occlusion and stenosis of bilateral carotid arteries: Secondary | ICD-10-CM

## 2020-10-21 DIAGNOSIS — Z95820 Peripheral vascular angioplasty status with implants and grafts: Secondary | ICD-10-CM

## 2020-10-21 DIAGNOSIS — I70249 Atherosclerosis of native arteries of left leg with ulceration of unspecified site: Secondary | ICD-10-CM

## 2020-11-04 ENCOUNTER — Encounter (INDEPENDENT_AMBULATORY_CARE_PROVIDER_SITE_OTHER): Payer: Medicaid Other

## 2020-11-04 ENCOUNTER — Ambulatory Visit (INDEPENDENT_AMBULATORY_CARE_PROVIDER_SITE_OTHER): Payer: Medicaid Other | Admitting: Nurse Practitioner

## 2020-11-09 ENCOUNTER — Ambulatory Visit: Payer: Medicaid Other | Admitting: Physician Assistant

## 2021-01-20 DIAGNOSIS — K219 Gastro-esophageal reflux disease without esophagitis: Secondary | ICD-10-CM | POA: Insufficient documentation

## 2021-01-20 DIAGNOSIS — E785 Hyperlipidemia, unspecified: Secondary | ICD-10-CM | POA: Insufficient documentation

## 2021-01-20 DIAGNOSIS — I70219 Atherosclerosis of native arteries of extremities with intermittent claudication, unspecified extremity: Secondary | ICD-10-CM | POA: Insufficient documentation

## 2021-01-20 NOTE — Progress Notes (Deleted)
MRN : [id number]  [name] is a 60 y.o. (1961/03/08) female who presents with chief complaint of No chief complaint on file. Marland Kitchen  History of Present Illness:   The patient is seen for evaluation of carotid stenosis. The carotid stenosis was identified after ***.  CT angiography of the neck performed 08/31/2020 demonstrated a 40 to 50% diameter reduction of the right internal carotid artery and a less than 50% diameter reduction of the left internal carotid artery.  This was followed up with a duplex ultrasound of the carotids dated 09/02/2020 which was reported as greater than 70% bilateral internal carotid artery stenosis.  The patient denies interval amaurosis fugax. There is no recent history of TIA symptoms or focal motor deficits. There is a prior documented CVA.  There is no history of migraine headaches. There is no history of seizures.  The patient is taking enteric-coated aspirin 81 mg daily.  The patient has a history of coronary artery disease, no recent episodes of angina or shortness of breath. The patient denies PAD or claudication symptoms. There is a history of hyperlipidemia which is being treated with a statin.    No outpatient medications have been marked as taking for the 01/21/21 encounter (Appointment) with Gilda Crease, Latina Craver, MD.    Past Medical History:  Diagnosis Date  . Anemia   . Anxiety   . Arthritis   . CHF (congestive heart failure) (HCC)   . COPD (chronic obstructive pulmonary disease) (HCC)   . Coronary artery disease   . Diabetes mellitus without complication (HCC)   . Fibromyalgia   . GERD (gastroesophageal reflux disease)   . Hypertension   . Peripheral vascular disease (HCC)   . Sleep apnea   . Stroke [facility])     Past Surgical History:  Procedure Laterality Date  . CESAREAN SECTION    . CORONARY ANGIOPLASTY WITH STENT PLACEMENT Left   . DILATION AND CURETTAGE OF UTERUS    . ENDARTERECTOMY Left 01/29/2015   Procedure: ENDARTERECTOMY  CAROTID;  Surgeon: Annice Needy, MD;  Location: [facility] ORS;  Service: Vascular;  Laterality: Left;  . LOWER EXTREMITY ANGIOGRAPHY Left 09/24/2020   Procedure: Lower Extremity Angiography;  Surgeon: Annice Needy, MD;  Location: [facility] INVASIVE CV LAB;  Service: Cardiovascular;  Laterality: Left;  . PERIPHERAL VASCULAR CATHETERIZATION Right 05/27/2015   Procedure: Lower Extremity Angiography;  Surgeon: Annice Needy, MD;  Location: [facility] INVASIVE CV LAB;  Service: Cardiovascular;  Laterality: Right;  . PERIPHERAL VASCULAR CATHETERIZATION  05/27/2015   Procedure: Lower Extremity Intervention;  Surgeon: Annice Needy, MD;  Location: [facility] INVASIVE CV LAB;  Service: Cardiovascular;;  . PERIPHERAL VASCULAR CATHETERIZATION Right 09/22/2016   Procedure: Lower Extremity Angiography;  Surgeon: Annice Needy, MD;  Location: [facility] INVASIVE CV LAB;  Service: Cardiovascular;  Laterality: Right;  . TUBAL LIGATION      Social History Social History   Tobacco Use  . Smoking status: Former Smoker    Packs/day: 0.25    Years: 38.00    Pack years: 9.50    Types: Cigarettes    Quit date: 09/12/2016    Years since quitting: 4.3  . Smokeless tobacco: Current User  Substance Use Topics  . Alcohol use: No  . Drug use: No    Family History No family history of bleeding/clotting disorders, porphyria or autoimmune disease   Allergies  Allergen Reactions  . Niacin And Related Nausea And Vomiting     REVIEW OF SYSTEMS (Negative unless  checked)  Constitutional: [] Weight loss  [] Fever  [] Chills Cardiac: [] Chest pain   [] Chest pressure   [] Palpitations   [] Shortness of breath when laying flat   [] Shortness of breath with exertion. Vascular:  [] Pain in legs with walking   [] Pain in legs at rest  [] History of DVT   [] Phlebitis   [] Swelling in legs   [] Varicose veins   [] Non-healing ulcers Pulmonary:   [] Uses home oxygen   [] Productive cough   [] Hemoptysis   [] Wheeze  [] COPD   [] Asthma Neurologic:  [] Dizziness   [] Seizures    [] History of stroke   [] History of TIA  [] Aphasia   [] Vissual changes   [] Weakness or numbness in arm   [] Weakness or numbness in leg Musculoskeletal:   [] Joint swelling   [] Joint pain   [] Low back pain Hematologic:  [] Easy bruising  [] Easy bleeding   [] Hypercoagulable state   [] Anemic Gastrointestinal:  [] Diarrhea   [] Vomiting  [] Gastroesophageal reflux/heartburn   [] Difficulty swallowing. Genitourinary:  [] Chronic kidney disease   [] Difficult urination  [] Frequent urination   [] Blood in urine Skin:  [] Rashes   [] Ulcers  Psychological:  [] History of anxiety   []  History of major depression.  Physical Examination  There were no vitals filed for this visit. There is no height or weight on file to calculate BMI. Gen: WD/WN, NAD Head: [place]/AT, No temporalis wasting.  Ear/Nose/Throat: Hearing grossly intact, nares w/o erythema or drainage, poor dentition Eyes: PER, EOMI, sclera nonicteric.  Neck: Supple, no masses.  No bruit or JVD.  Pulmonary:  Good air movement, clear to auscultation bilaterally, no use of accessory muscles.  Cardiac: RRR, normal S1, S2, no Murmurs. Vascular: *** Vessel Right Left  Radial Palpable Palpable  Carotid Palpable Palpable  Popliteal Palpable Palpable  PT Palpable Palpable  DP Palpable Palpable  Gastrointestinal: soft, non-distended. No guarding/no peritoneal signs.  Musculoskeletal: M/S 5/5 throughout.  No deformity or atrophy.  Neurologic: CN 2-12 intact. Pain and light touch intact in extremities.  Symmetrical.  Speech is fluent. Motor exam as listed above. Psychiatric: Judgment intact, Mood & affect appropriate for pt's clinical situation. Dermatologic: No rashes or ulcers noted.  No changes consistent with cellulitis. Lymph : No Cervical lymphadenopathy, no lichenification or skin changes of chronic lymphedema.  CBC Lab Results  Component Value Date   WBC 13.3 (H) 09/25/2020   HGB 10.4 (L) 09/25/2020   HCT 33.4 (L) 09/25/2020   MCV 85.0 09/25/2020    PLT 187 09/25/2020    BMET    Component Value Date/Time   NA 140 09/25/2020 0419   NA 137 01/22/2015 1146   K 3.6 09/25/2020 0419   K 4.2 01/22/2015 1146   CL 106 09/25/2020 0419   CL 101 01/22/2015 1146   CO2 27 09/25/2020 0419   CO2 29 01/22/2015 1146   GLUCOSE 240 (H) 09/25/2020 0419   GLUCOSE 171 (H) 01/22/2015 1146   BUN 14 09/25/2020 0419   BUN 12 01/22/2015 1146   CREATININE 1.00 09/25/2020 0419   CREATININE 0.81 01/22/2015 1146   CALCIUM 8.2 (L) 09/25/2020 0419   CALCIUM 9.2 01/22/2015 1146   GFRNONAA >60 09/25/2020 0419   GFRNONAA >60 01/22/2015 1146   GFRAA >60 09/20/2016 1431   GFRAA >60 01/22/2015 1146   CrCl cannot be calculated (Patient's most recent lab result is older than the maximum 21 days allowed.).  COAG Lab Results  Component Value Date   INR 1.1 09/23/2020   INR 1.1 01/22/2015   INR 1.1 08/08/2014  Radiology No results found.   Assessment/Plan There are no diagnoses linked to this encounter.   Levora Dredge, MD  01/20/2021 4:58 PM

## 2021-01-21 ENCOUNTER — Encounter (INDEPENDENT_AMBULATORY_CARE_PROVIDER_SITE_OTHER): Payer: Self-pay | Admitting: Vascular Surgery

## 2021-01-21 ENCOUNTER — Encounter (INDEPENDENT_AMBULATORY_CARE_PROVIDER_SITE_OTHER): Payer: Medicaid Other | Admitting: Vascular Surgery

## 2021-01-21 DIAGNOSIS — I70213 Atherosclerosis of native arteries of extremities with intermittent claudication, bilateral legs: Secondary | ICD-10-CM

## 2021-01-21 DIAGNOSIS — I10 Essential (primary) hypertension: Secondary | ICD-10-CM

## 2021-01-21 DIAGNOSIS — E782 Mixed hyperlipidemia: Secondary | ICD-10-CM

## 2021-01-21 DIAGNOSIS — K219 Gastro-esophageal reflux disease without esophagitis: Secondary | ICD-10-CM

## 2021-01-21 DIAGNOSIS — E1159 Type 2 diabetes mellitus with other circulatory complications: Secondary | ICD-10-CM

## 2021-01-21 DIAGNOSIS — I6523 Occlusion and stenosis of bilateral carotid arteries: Secondary | ICD-10-CM

## 2021-09-27 IMAGING — US US EXTREM LOW VENOUS
1 series · 14 of 24 positions shown · non-contrast
Comparison: None.

CLINICAL DATA: Bilateral leg pain

EXAM:
BILATERAL LOWER EXTREMITY VENOUS DOPPLER ULTRASOUND
TECHNIQUE: Gray-scale sonography with compression, as well as color and duplex
ultrasound, were performed to evaluate the deep venous system(s)
from the level of the common femoral vein through the popliteal and
proximal calf veins.

[Series 1: us venous img lower bilat (dvt) · portal-venous · 14 of 54 slices shown]
[im 1/54]
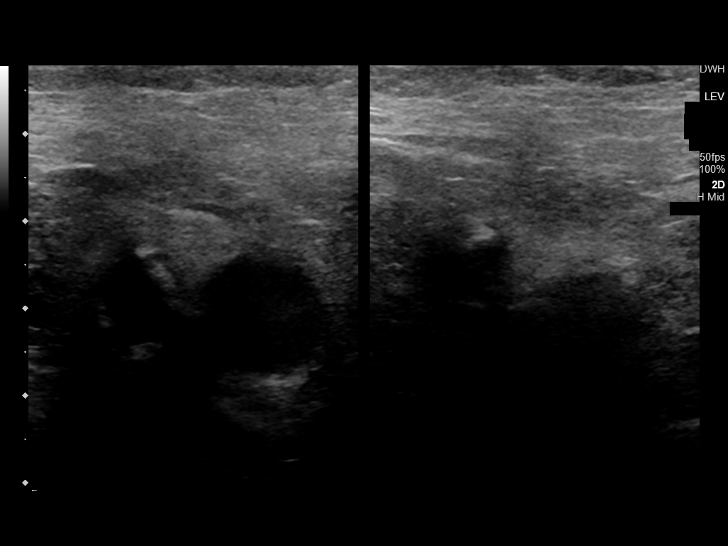
[im 5/54]
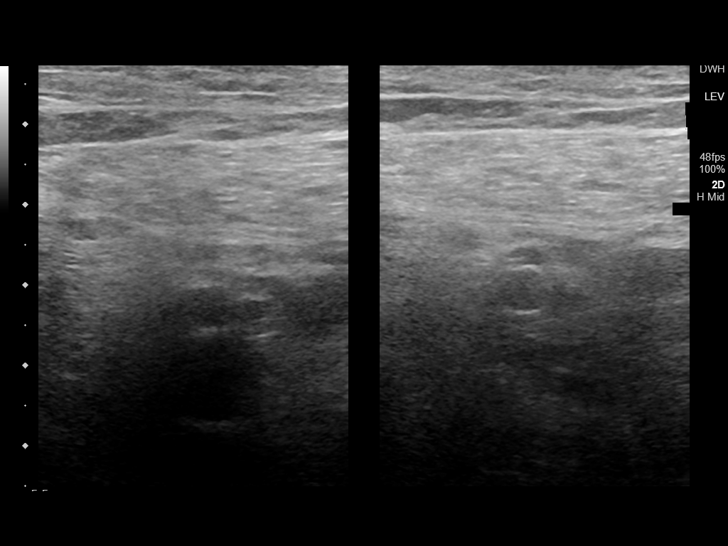
[im 10/54]
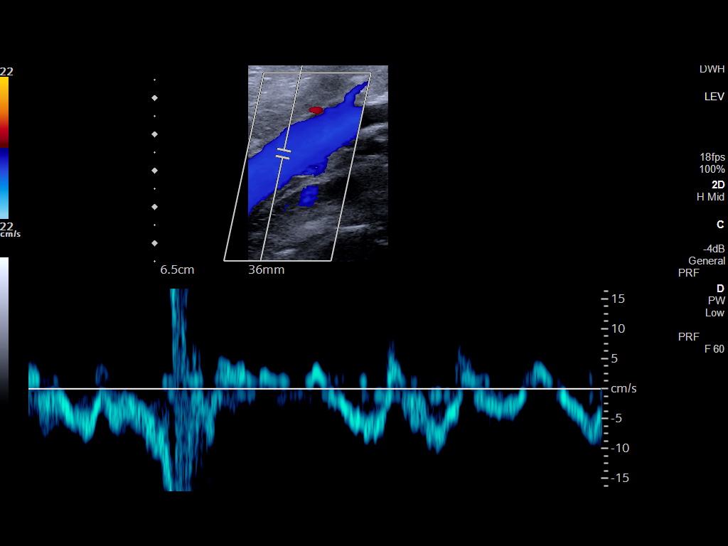
[im 14/54]
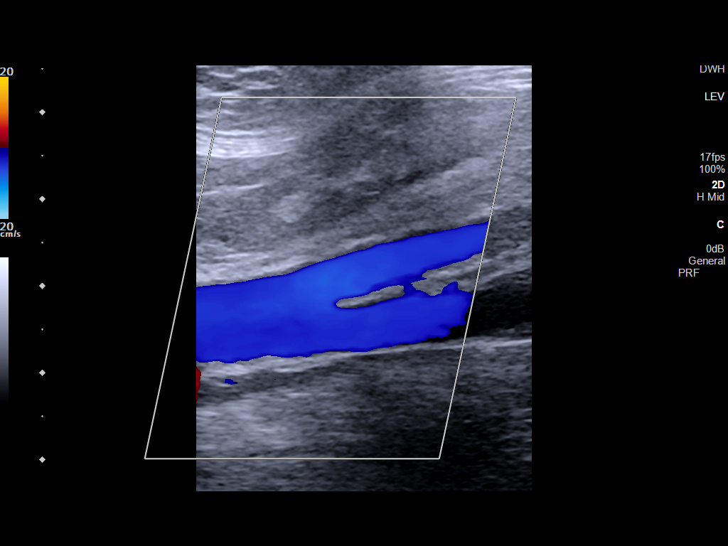
[im 17/54]
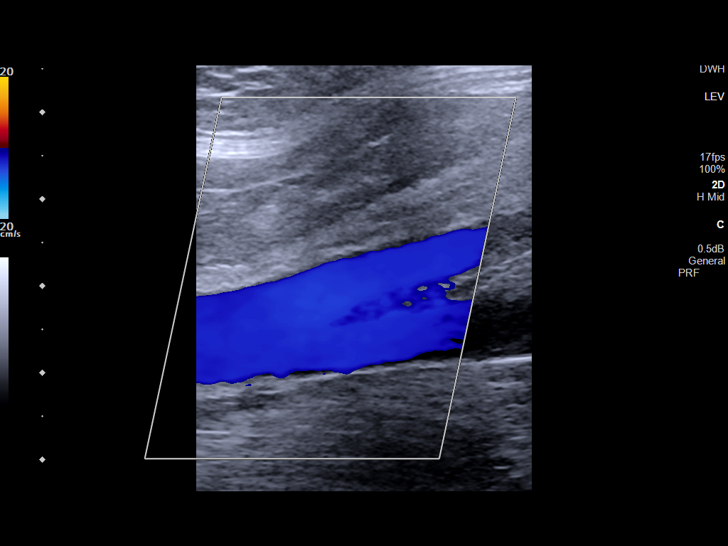
[im 21/54]
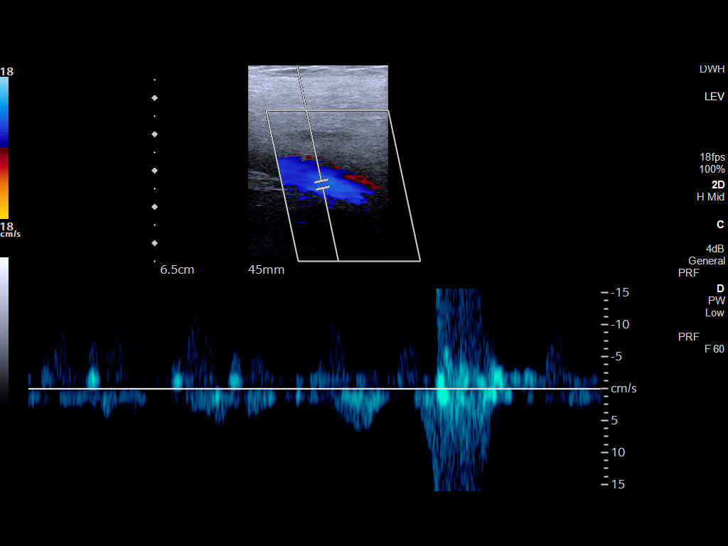
[im 26/54]
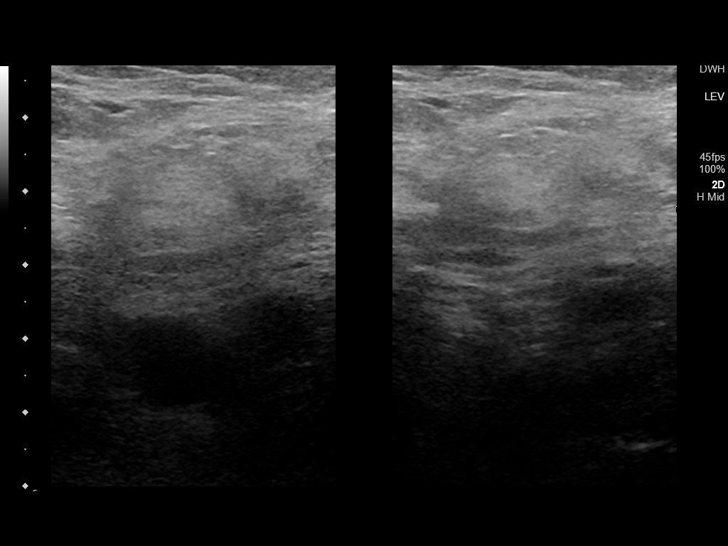
[im 28/54]
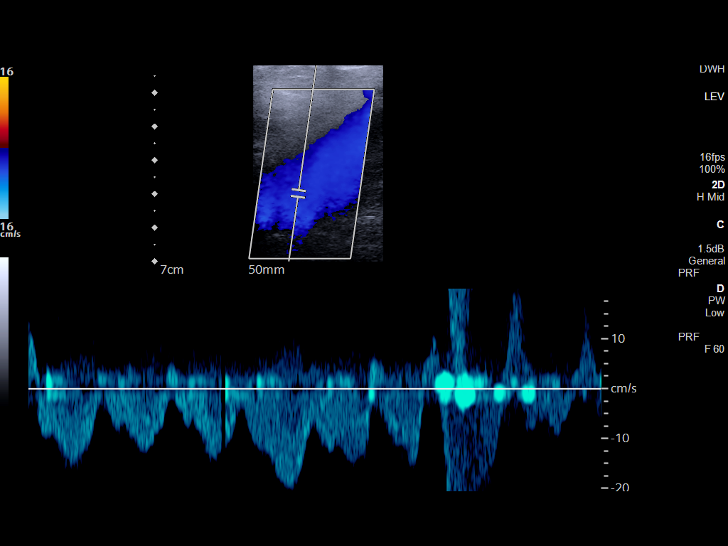
[im 33/54]
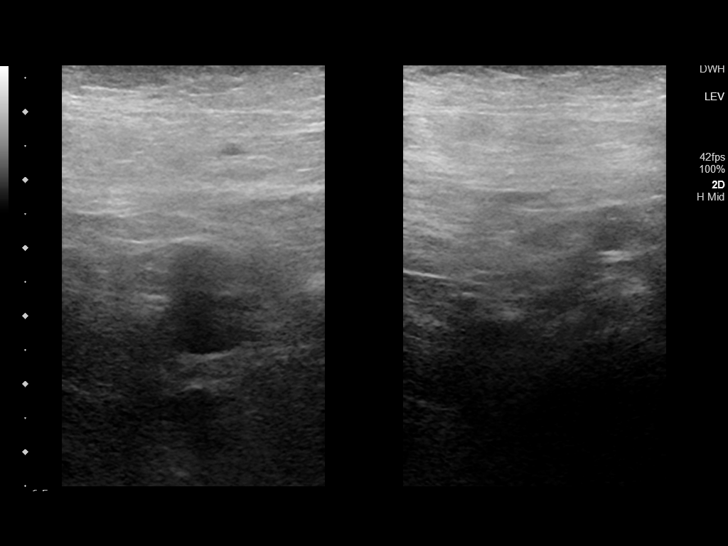
[im 37/54]
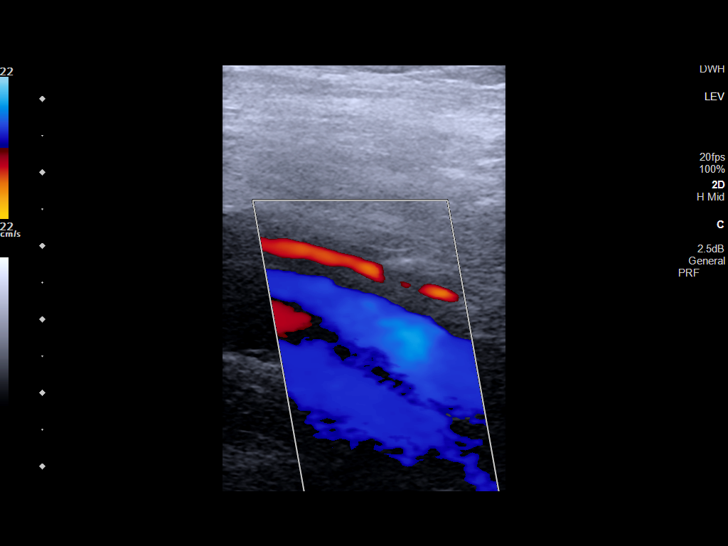
[im 42/54]
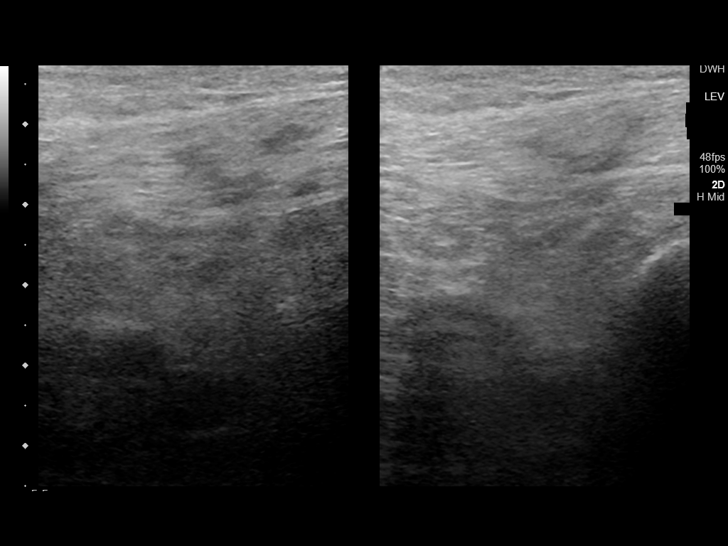
[im 44/54]
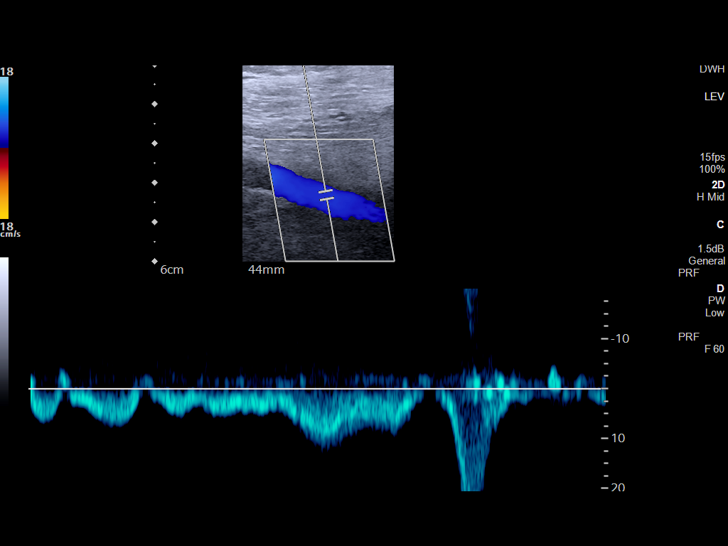
[im 49/54]
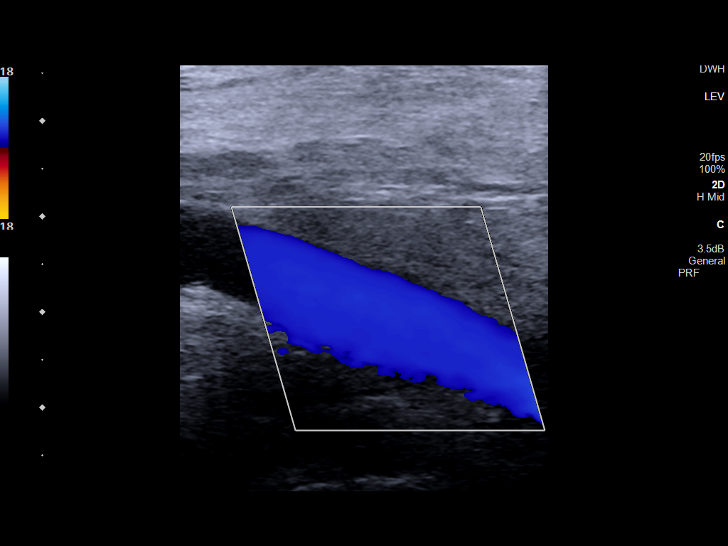
[im 54/54]
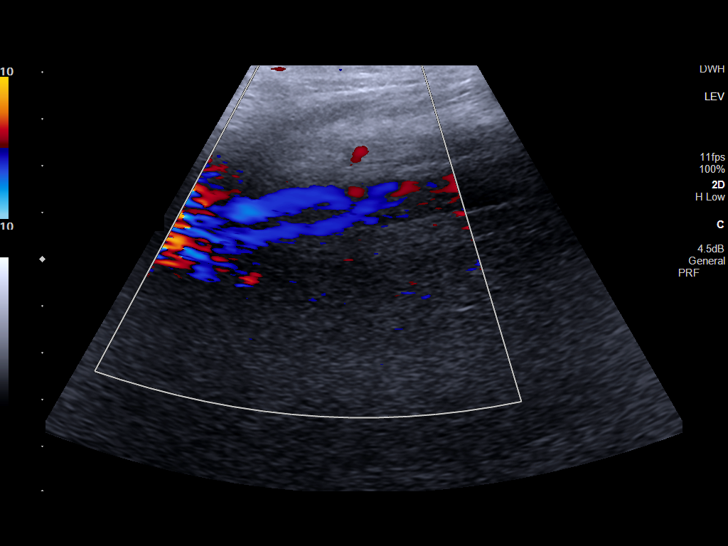

[14 of 24 positions shown; findings below may reference images not displayed]

FINDINGS: VENOUS

Normal compressibility of the common femoral, superficial femoral,
and popliteal veins, as well as the visualized calf veins of the
left lower extremity. Right below-knee amputation has been
performed. Visualized portions of profunda femoral vein and great
saphenous vein unremarkable. No filling defects to suggest DVT on
grayscale or color Doppler imaging. Doppler waveforms show normal
direction of venous flow, normal respiratory plasticity and response
to augmentation.

OTHER

None.

Limitations: none
IMPRESSION: Negative.

## 2021-11-16 ENCOUNTER — Other Ambulatory Visit: Payer: Self-pay

## 2021-11-16 ENCOUNTER — Ambulatory Visit: Payer: Medicaid Other | Attending: Primary Care

## 2021-11-16 DIAGNOSIS — R262 Difficulty in walking, not elsewhere classified: Secondary | ICD-10-CM | POA: Diagnosis not present

## 2021-11-16 DIAGNOSIS — R269 Unspecified abnormalities of gait and mobility: Secondary | ICD-10-CM | POA: Insufficient documentation

## 2021-11-16 DIAGNOSIS — M6281 Muscle weakness (generalized): Secondary | ICD-10-CM | POA: Insufficient documentation

## 2021-11-16 NOTE — Therapy (Signed)
[place] [facility] [address] [place], [place], [address] Phone: [phone]   Fax:  [phone]  Physical Therapy Evaluation  Patient Details  Name: [name] MRN: [id number] Date of Birth: 02/12/1961 Referring Provider (PT): Sandrea Hughs   Encounter Date: 11/16/2021   PT End of Session - 11/16/21 1034     Visit Number 1    Number of Visits 17    Date for PT Re-Evaluation 01/11/22    PT Start Time 0932    PT Stop Time 1020    PT Time Calculation (min) 48 min    Equipment Utilized During Treatment Gait belt    Activity Tolerance Patient limited by fatigue    Behavior During Therapy WFL for tasks assessed/performed             Past Medical History:  Diagnosis Date   Anemia    Anxiety    Arthritis    CHF (congestive heart failure) (HCC)    COPD (chronic obstructive pulmonary disease) (HCC)    Coronary artery disease    Diabetes mellitus without complication (HCC)    Fibromyalgia    GERD (gastroesophageal reflux disease)    Hypertension    Peripheral vascular disease (HCC)    Sleep apnea    Stroke [facility])     Past Surgical History:  Procedure Laterality Date   CESAREAN SECTION     CORONARY ANGIOPLASTY WITH STENT PLACEMENT Left    DILATION AND CURETTAGE OF UTERUS     ENDARTERECTOMY Left 01/29/2015   Procedure: ENDARTERECTOMY CAROTID;  Surgeon: Annice Needy, MD;  Location: [facility] ORS;  Service: Vascular;  Laterality: Left;   LOWER EXTREMITY ANGIOGRAPHY Left 09/24/2020   Procedure: Lower Extremity Angiography;  Surgeon: Annice Needy, MD;  Location: [facility] INVASIVE CV LAB;  Service: Cardiovascular;  Laterality: Left;   PERIPHERAL VASCULAR CATHETERIZATION Right 05/27/2015   Procedure: Lower Extremity Angiography;  Surgeon: Annice Needy, MD;  Location: [facility] INVASIVE CV LAB;  Service: Cardiovascular;  Laterality: Right;   PERIPHERAL VASCULAR CATHETERIZATION  05/27/2015   Procedure: Lower Extremity Intervention;  Surgeon: Annice Needy, MD;  Location: [facility] INVASIVE CV LAB;  Service: Cardiovascular;;   PERIPHERAL VASCULAR CATHETERIZATION Right 09/22/2016   Procedure: Lower Extremity Angiography;  Surgeon: Annice Needy, MD;  Location: [facility] INVASIVE CV LAB;  Service: Cardiovascular;  Laterality: Right;   TUBAL LIGATION      There were no vitals filed for this visit.    Subjective Assessment - 11/16/21 0914     Subjective Pt is a 61 y.o. 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 PMH includes:  anemia, anxiety, arthritis, CHF, COPD, CAD, diabetes, fibromyalgia, gastric reflux, hypertension,CAD,OSA,PVD s/p right BKA, with history of recent admission to [facility] 12/4-12/11/21 for left foot cellulitis s due to infected laceration.    Pertinent History Pt is a 61 y.o. 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 PMH includes:  anemia, anxiety, arthritis, CHF, COPD, CAD, diabetes, fibromyalgia, gastric reflux, hypertension,CAD,OSA,PVD s/p right BKA, with history of recent admission to [facility] 12/4-12/11/21 for left foot cellulitis s due to infected laceration. Pt reports having her R BKA in either 2016 or 2018 and received a prosthesis but has not worn it since then due to some fear. Also endorsing L knee pain she contributes to arthritis. Pt wanting to improve her strength and mobiltiy in hopes of receiving a new prosthetic. Pt relies  on powerchair for current mobility with scoot transfers for toileting, to bed, etc. Denies any falls with transfers. Pt reports being able to stand but does not do if often. Pt has not walked since her amputation. Pt's goal is to improve strength, mobility and be able to walk.    Limitations House hold activities;Lifting;Standing;Walking             OBJECTIVE  Musculoskeletal Tremor: Pt endorses having a tremor. Does display sporadic, occasional limb movements that are jerky in nature Bulk: Normal Tone: Normal, no clonus   Posture Seated: Slouched posture in sitting  leaving R BKA in dependent position. Standing: Heavy reliance on UE's on RW in truncal flexed posture.  Gait Pt unable to perform as this time.   Transfers Pt able to safely scoot transfer to/from powerchair and mat table with supervision with min VC's for power chair proximity to mat table.   Pt able to stand to RW on LLE with CGA and VC's for safe hand placement. Unable to achieve terminal knee extension on LLE in WB'ing position. Is minimally unsteady with heavy UE use on RW.   Standing time to RW on LLE with CGA: 10.66s  Strength R/L 4/4 Hip flexion 4/4 Hip extension  4/4+ Hip abduction 4/4 Knee extension NT/4 Ankle Plantarflexion NT/4 Ankle Dorsiflexion  Lower Extremity AROM R/L:  Hip extension: Further assess next session. Body habitus and jerky limb motions limiting accurate measurement. Anticipate limited AROM due to frequent sitting in powerchair and limited standing tolerance.   Knee extension: (BKA propped on 1/2 bolster) lacking 20/ lacking 10 Knee flexion: 105/106   NEUROLOGICAL:  Mental Status Patient's fund of knowledge is within normal limits for educational level.   Sensation Grossly intact to light touch bilateral UEs/LEs as determined by testing dermatomes C2-T2/L2-S2 respectively. Proprioception and hot/cold testing deferred on this date    Education provided on HEP as listed below. Education provided on edema management, maintaining R limb out of dependent positions and use of shrinker.    Access Code: [email or web address] URL: [email or web address] Date: 11/16/2021 Prepared by: Ronnie Derby  Exercises Long Sitting Quad Set with Towel Roll Under Heel - 1 x daily - 7 x weekly - 3 sets - 10 reps Sidelying Hip Abduction - 1 x daily - 7 x weekly - 3 sets - 10 reps Prone Hip Extension - 1 x daily - 7 x weekly - 3 sets - 10 reps    [facility] PT Assessment - 11/16/21 0942       Assessment   Medical Diagnosis S/P R BKA    Referring Provider (PT)  Sandrea Hughs    Prior Therapy Yes      Balance Screen   Has the patient fallen in the past 6 months No      Home Environment   Living Environment Private residence    Living Arrangements Spouse/significant other    Type of Home Apartment    Home Access Ramped entrance    Home Layout One level    Home Equipment Shower seat;Wheelchair - power;Walker - standard      Prior Function   Level of Independence Independent with transfers;Requires assistive device for independence;Needs assistance with ADLs;Needs assistance with homemaking              Objective measurements completed on examination: See above findings.    PT Education - 11/16/21 1032     Education Details POC, Importance of AROM and strength to progress to ambulation tasks. Need for use  of shrinker and keeping limb out of dependent positions.    Person(s) Educated Patient;Spouse    Methods Explanation;Demonstration;Tactile cues;Verbal cues;Handout    Comprehension Verbalized understanding;Need further instruction              PT Short Term Goals - 11/16/21 1049       PT SHORT TERM GOAL #1   Title Pt will be independent with HEP to improve strength and AROM deficits to optimize standing, transfer and walking tolerance.    Baseline 11/16/21: Established    Time 4    Period Weeks    Status New    Target Date 12/14/21      PT SHORT TERM GOAL #2   Title Pt will improve standing tolerance to >/= 30 sec with RW/SW for skin integrity, hip stability, and in prep for stand pivot transfers/pre gait activities.    Baseline 11/16/21: 10.66 sec with RW    Time 4    Period Weeks    Status New    Target Date 12/14/21               PT Long Term Goals - 11/16/21 1053       PT LONG TERM GOAL #1   Title Pt will improve FOTO to target score of 43 to display clinically significant improvement in functional mobility.    Baseline 11/16/21: 43    Time 8    Period Weeks    Status New    Target Date 01/11/22       PT LONG TERM GOAL #2   Title Pt will improve R and L knee extension by >/= 10 degrees to assist in quad/hip stability with standing transfers.    Baseline 11/16/21: Lacking 20 deg on RLE and 10 deg on LLE.    Time 8    Period Weeks    Status New    Target Date 01/11/22      PT LONG TERM GOAL #3   Title Pt will perform stand pivot transfers with LRAD to/from powerchair with CGA to improve tolerance for upright mobility and functional strengthening.    Baseline 11/16/21: Performing scoot transfers from powerchair to other surfaces    Time 8    Period Weeks    Status New    Target Date 01/11/22      PT LONG TERM GOAL #4   Title Pt will be able to perform hop to gait with RW 10' to demo safe, short household navigation distances.    Baseline 11/16/21: Standing at RW only    Time 8    Period Weeks    Status New    Target Date 01/11/22                    Plan - 11/16/21 1036     Clinical Impression Statement Pt is a 61 y.o. female referred to PT for strengthening and gait training due to chronic S/P R BKA either performed in 2016 or 2018 as pt was not aware of her exact year and date of operation. Pt presenting with deficits in standing tolerance, knee and hip AROM and muscular weakness placing pt at risk for falls with transfers and further deconditioning due ot reliance of powerchair for mobility at this point in time with heavy reliance on significant others for ADL and IADL completion. Pt will benefit from skilled PT services to attempt to improve strength and mobility to reduce risk of falls and optmize independence with ADL completion and  functional mobility.    Personal Factors and Comorbidities Age;Comorbidity 3+;Education;Time since onset of injury/illness/exacerbation;Past/Current Experience    Comorbidities anemia, anxiety, arthritis, CHF, COPD, CAD, diabetes, fibromyalgia, gastric reflux, hypertension,CAD,OSA,PVD    Examination-Activity Limitations  Bathing;Hygiene/Grooming;Bed Mobility;Stairs;Risk analystLocomotion Level;Stand;Reach Overhead;Toileting;Continence;Dressing    Examination-Participation Restrictions Cleaning;Community Activity;Meal Prep;Driving    Stability/Clinical Decision Making Evolving/Moderate complexity    Clinical Decision Making High    Rehab Potential Poor    PT Frequency 2x / week    PT Duration 8 weeks    PT Treatment/Interventions ADLs/Self Care Home Management;DME Instruction;Gait training;Functional mobility training;Therapeutic activities;Therapeutic exercise;Balance training;Neuromuscular re-education;Patient/family education;Prosthetic Training;Orthotic Fit/Training;Wheelchair mobility training;Passive range of motion;Energy conservation    PT Next Visit Plan Reassess HEP, standing tolerance, hip extension AROM with limb proped on bolster    PT Home Exercise Plan Access Code [email or web address]    Recommended Other Services Occupational therapy and prosthetist    Consulted and Agree with Plan of Care Patient;Family member/caregiver    Family Member Consulted Significant other             Patient will benefit from skilled therapeutic intervention in order to improve the following deficits and impairments:  Decreased knowledge of use of DME, Pain, Cardiopulmonary status limiting activity, Decreased mobility, Postural dysfunction, Decreased activity tolerance, Decreased endurance, Decreased range of motion, Decreased strength, Impaired perceived functional ability, Decreased balance, Decreased safety awareness, Difficulty walking, Impaired flexibility  Visit Diagnosis: Difficulty in walking, not elsewhere classified  Muscle weakness (generalized)  Abnormality of gait and mobility     Problem List Patient Active Problem List   Diagnosis Date Noted   Atherosclerosis of native arteries of extremity with intermittent claudication (HCC) 01/20/2021   Hyperlipidemia 01/20/2021   GERD (gastroesophageal reflux disease)  01/20/2021   Cellulitis 09/23/2020   Diabetic foot infection (HCC) 09/23/2020   Open toe wound 12/20/2016   Lower limb ulcer, calf, right, limited to breakdown of skin (HCC) 11/22/2016   PAD (peripheral artery disease) (HCC) 10/20/2016   Diabetes (HCC) 09/13/2016   Essential hypertension, benign 09/13/2016   Morbid obesity (HCC) 09/13/2016   Atherosclerosis of native arteries of the extremities with ulceration (HCC) 09/13/2016   Tobacco use disorder 09/13/2016   Carotid artery obstruction 01/29/2015    Delphia GratesMilton M. Fairly IV, PT, DPT Physical Therapist- [facility]  [facility]  11/16/2021, 11:26 AM   [facility] [address] [place], [place], [address] Phone: [phone]   Fax:  [phone]  Name: [name] MRN: [id number] Date of Birth: 10/17/1960

## 2021-11-17 ENCOUNTER — Other Ambulatory Visit: Payer: Self-pay | Admitting: Primary Care

## 2021-11-17 DIAGNOSIS — Z1231 Encounter for screening mammogram for malignant neoplasm of breast: Secondary | ICD-10-CM

## 2021-11-23 ENCOUNTER — Ambulatory Visit: Payer: Medicaid Other

## 2021-11-25 ENCOUNTER — Ambulatory Visit: Payer: Medicaid Other

## 2021-11-29 ENCOUNTER — Ambulatory Visit: Payer: Medicaid Other | Attending: Primary Care

## 2021-11-29 ENCOUNTER — Other Ambulatory Visit: Payer: Self-pay

## 2021-11-29 DIAGNOSIS — R269 Unspecified abnormalities of gait and mobility: Secondary | ICD-10-CM | POA: Insufficient documentation

## 2021-11-29 DIAGNOSIS — R262 Difficulty in walking, not elsewhere classified: Secondary | ICD-10-CM | POA: Diagnosis present

## 2021-11-29 DIAGNOSIS — M6281 Muscle weakness (generalized): Secondary | ICD-10-CM | POA: Insufficient documentation

## 2021-11-29 NOTE — Patient Instructions (Signed)
Access Code: [email or web address] ?URL: [email or web address] ?Date: 11/29/2021 ?Prepared by: Ria Comment ? ?Exercises ?Supine Quad Set (BKA) (Mirrored) - 1 x daily - 7 x weekly - 2 sets - 10 reps - 5s hold ?Sidelying Hip Abduction (Mirrored) - 1 x daily - 7 x weekly - 2 sets - 10 reps - 5s hold ?Seated Knee Extension (BKA) (Mirrored) - 1 x daily - 7 x weekly - 2 sets - 10 reps - 5s hold ? ?

## 2021-11-29 NOTE — Therapy (Signed)
[facility] [address] [place], [place], [address] Phone: [phone]   Fax:  [phone]  Physical Therapy Treatment  Patient Details  Name: [name] MRN: [id number] Date of Birth: 26-Jul-1961 Referring Provider (PT): Sandrea Hughs   Encounter Date: 11/29/2021   PT End of Session - 11/29/21 0945     Visit Number 2    Number of Visits 17    Date for PT Re-Evaluation 01/11/22    PT Start Time 0935    PT Stop Time 1015    PT Time Calculation (min) 40 min    Equipment Utilized During Treatment Gait belt    Activity Tolerance Patient limited by fatigue    Behavior During Therapy WFL for tasks assessed/performed             Past Medical History:  Diagnosis Date   Anemia    Anxiety    Arthritis    CHF (congestive heart failure) (HCC)    COPD (chronic obstructive pulmonary disease) (HCC)    Coronary artery disease    Diabetes mellitus without complication (HCC)    Fibromyalgia    GERD (gastroesophageal reflux disease)    Hypertension    Peripheral vascular disease (HCC)    Sleep apnea    Stroke [facility])     Past Surgical History:  Procedure Laterality Date   CESAREAN SECTION     CORONARY ANGIOPLASTY WITH STENT PLACEMENT Left    DILATION AND CURETTAGE OF UTERUS     ENDARTERECTOMY Left 01/29/2015   Procedure: ENDARTERECTOMY CAROTID;  Surgeon: Annice Needy, MD;  Location: [facility] ORS;  Service: Vascular;  Laterality: Left;   LOWER EXTREMITY ANGIOGRAPHY Left 09/24/2020   Procedure: Lower Extremity Angiography;  Surgeon: Annice Needy, MD;  Location: [facility] INVASIVE CV LAB;  Service: Cardiovascular;  Laterality: Left;   PERIPHERAL VASCULAR CATHETERIZATION Right 05/27/2015   Procedure: Lower Extremity Angiography;  Surgeon: Annice Needy, MD;  Location: [facility] INVASIVE CV LAB;  Service: Cardiovascular;  Laterality: Right;   PERIPHERAL VASCULAR CATHETERIZATION  05/27/2015   Procedure: Lower Extremity Intervention;  Surgeon: Annice Needy, MD;  Location: [facility] INVASIVE CV LAB;  Service: Cardiovascular;;   PERIPHERAL VASCULAR CATHETERIZATION Right 09/22/2016   Procedure: Lower Extremity Angiography;  Surgeon: Annice Needy, MD;  Location: [facility] INVASIVE CV LAB;  Service: Cardiovascular;  Laterality: Right;   TUBAL LIGATION      There were no vitals filed for this visit.   Subjective Assessment - 11/29/21 0943     Subjective Pt reports that she is doing well today. No changes since the initial evaluation. She denies pain upon arrival. She forgot to bring her prosthetic    Pertinent History Pt is a 61 y.o. 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 PMH includes:  anemia, anxiety, arthritis, CHF, COPD, CAD, diabetes, fibromyalgia, gastric reflux, hypertension,CAD,OSA,PVD s/p right BKA, with history of recent admission to [facility] 12/4-12/11/21 for left foot cellulitis s due to infected laceration. Pt reports having her R BKA in either 2016 or 2018 and received a prosthesis but has not worn it since then due to some fear. Also endorsing L knee pain she contributes to arthritis. Pt wanting to improve her strength and mobiltiy in hopes of receiving a new prosthetic. Pt relies on powerchair for current mobility with scoot transfers for toileting, to bed, etc. Denies any falls with transfers. Pt reports being able to stand but does not do if  often. Pt has not walked since her amputation. Pt's goal is to improve strength, mobility and be able to walk.    Limitations House hold activities;Lifting;Standing;Walking    Currently in Pain? No/denies                   TREATMENT   Ther-ex  Supine quad/glut sets with 3s hold x 10; Supine SLR hip flexion 2 x 10 BLE, manual resistance on RLE; Supine straight leg hip abduction 2 x 10 RLE, with manual resistance;  Supine straight leg hip adduction 2 x 10 RLE, with manual resistance; Supine bent knee manually resisted clam 2 x 10 LLE; Supine bent knee manually resisted  adduction 2 x 10 LLE; Hooklying knee bridges over bolster with arms at side x10, x 6; Seated RLE LAQ with manual resistance x 10; Seated RLE HS curl with manual resistance x 10; Sit to stand with BUE support on walker and mat table elevated 2 x 5; HEP updated, reviewed, and handout provided;   Pt educated throughout session about proper posture and technique with exercises. Improved exercise technique, movement at target joints, use of target muscles after min to mod verbal, visual, tactile cues.    Pt demonstrates excellent motivation during session. She arrived late so session was slightly abbreviated. Focused on supine and seated strengthening but also added sit to stands to today session. Pt is easily fatigued during bridges and sit to stands. HEP updated, reviewed, and handout provided to patient and her boyfriend. Pt will benefit from PT services to address deficits in strength, balance, and mobility in order to improve function at home.                           PT Short Term Goals - 11/16/21 1049       PT SHORT TERM GOAL #1   Title Pt will be independent with HEP to improve strength and AROM deficits to optimize standing, transfer and walking tolerance.    Baseline 11/16/21: Established    Time 4    Period Weeks    Status New    Target Date 12/14/21      PT SHORT TERM GOAL #2   Title Pt will improve standing tolerance to >/= 30 sec with RW/SW for skin integrity, hip stability, and in prep for stand pivot transfers/pre gait activities.    Baseline 11/16/21: 10.66 sec with RW    Time 4    Period Weeks    Status New    Target Date 12/14/21               PT Long Term Goals - 11/16/21 1053       PT LONG TERM GOAL #1   Title Pt will improve FOTO to target score of 43 to display clinically significant improvement in functional mobility.    Baseline 11/16/21: 43    Time 8    Period Weeks    Status New    Target Date 01/11/22      PT LONG TERM GOAL  #2   Title Pt will improve R and L knee extension by >/= 10 degrees to assist in quad/hip stability with standing transfers.    Baseline 11/16/21: Lacking 20 deg on RLE and 10 deg on LLE.    Time 8    Period Weeks    Status New    Target Date 01/11/22      PT LONG TERM GOAL #3  Title Pt will perform stand pivot transfers with LRAD to/from powerchair with CGA to improve tolerance for upright mobility and functional strengthening.    Baseline 11/16/21: Performing scoot transfers from powerchair to other surfaces    Time 8    Period Weeks    Status New    Target Date 01/11/22      PT LONG TERM GOAL #4   Title Pt will be able to perform hop to gait with RW 10' to demo safe, short household navigation distances.    Baseline 11/16/21: Standing at RW only    Time 8    Period Weeks    Status New    Target Date 01/11/22                   Plan - 11/29/21 0946     Clinical Impression Statement Pt demonstrates excellent motivation during session. She arrived late so session was slightly abbreviated. Focused on supine and seated strengthening but also added sit to stands to today session. Pt is easily fatigued during bridges and sit to stands. HEP updated, reviewed, and handout provided to patient and her boyfriend. Pt will benefit from PT services to address deficits in strength, balance, and mobility in order to improve function at home.    Personal Factors and Comorbidities Age;Comorbidity 3+;Education;Time since onset of injury/illness/exacerbation;Past/Current Experience    Comorbidities anemia, anxiety, arthritis, CHF, COPD, CAD, diabetes, fibromyalgia, gastric reflux, hypertension,CAD,OSA,PVD    Examination-Activity Limitations Bathing;Hygiene/Grooming;Bed Mobility;Stairs;Risk analyst;Toileting;Continence;Dressing    Examination-Participation Restrictions Cleaning;Community Activity;Meal Prep;Driving    Stability/Clinical Decision Making Evolving/Moderate  complexity    Rehab Potential Poor    PT Frequency 2x / week    PT Duration 8 weeks    PT Treatment/Interventions ADLs/Self Care Home Management;DME Instruction;Gait training;Functional mobility training;Therapeutic activities;Therapeutic exercise;Balance training;Neuromuscular re-education;Patient/family education;Prosthetic Training;Orthotic Fit/Training;Wheelchair mobility training;Passive range of motion;Energy conservation    PT Next Visit Plan Review HEP, practice transfers    PT Home Exercise Plan Access Code [email or web address]    Consulted and Agree with Plan of Care Patient;Family member/caregiver    Family Member Consulted Significant other             Patient will benefit from skilled therapeutic intervention in order to improve the following deficits and impairments:  Decreased knowledge of use of DME, Pain, Cardiopulmonary status limiting activity, Decreased mobility, Postural dysfunction, Decreased activity tolerance, Decreased endurance, Decreased range of motion, Decreased strength, Impaired perceived functional ability, Decreased balance, Decreased safety awareness, Difficulty walking, Impaired flexibility  Visit Diagnosis: Difficulty in walking, not elsewhere classified  Muscle weakness (generalized)     Problem List Patient Active Problem List   Diagnosis Date Noted   Atherosclerosis of native arteries of extremity with intermittent claudication (HCC) 01/20/2021   Hyperlipidemia 01/20/2021   GERD (gastroesophageal reflux disease) 01/20/2021   Cellulitis 09/23/2020   Diabetic foot infection (HCC) 09/23/2020   Open toe wound 12/20/2016   Lower limb ulcer, calf, right, limited to breakdown of skin (HCC) 11/22/2016   PAD (peripheral artery disease) (HCC) 10/20/2016   Diabetes (HCC) 09/13/2016   Essential hypertension, benign 09/13/2016   Morbid obesity (HCC) 09/13/2016   Atherosclerosis of native arteries of the extremities with ulceration (HCC) 09/13/2016   Tobacco use  disorder 09/13/2016   Carotid artery obstruction 01/29/2015   Lynnea Maizes PT, DPT, GCS  Alasia Enge, PT 11/29/2021, 10:18 AM  [facility] [address] [place], [place], [address] Phone: [phone]   Fax:  [phone]  Name: BREANE GRUNWALD MRN: [id number] Date of Birth: 14-Mar-1961

## 2021-12-01 ENCOUNTER — Ambulatory Visit: Payer: Medicaid Other

## 2021-12-01 ENCOUNTER — Other Ambulatory Visit: Payer: Self-pay

## 2021-12-01 DIAGNOSIS — R262 Difficulty in walking, not elsewhere classified: Secondary | ICD-10-CM

## 2021-12-01 DIAGNOSIS — M6281 Muscle weakness (generalized): Secondary | ICD-10-CM

## 2021-12-01 NOTE — Therapy (Signed)
[facility] [address] [place], [place], [address] Phone: [phone]   Fax:  [phone]  Physical Therapy Treatment  Patient Details  Name: [name] MRN: [id number] Date of Birth: May 24, 1961 Referring Provider (PT): Sandrea Hughs   Encounter Date: 12/01/2021   PT End of Session - 12/01/21 0930     Visit Number 3    Number of Visits 17    Date for PT Re-Evaluation 01/11/22    PT Start Time 0931    PT Stop Time 1015    PT Time Calculation (min) 44 min    Equipment Utilized During Treatment Gait belt    Activity Tolerance Patient limited by fatigue    Behavior During Therapy WFL for tasks assessed/performed             Past Medical History:  Diagnosis Date   Anemia    Anxiety    Arthritis    CHF (congestive heart failure) (HCC)    COPD (chronic obstructive pulmonary disease) (HCC)    Coronary artery disease    Diabetes mellitus without complication (HCC)    Fibromyalgia    GERD (gastroesophageal reflux disease)    Hypertension    Peripheral vascular disease (HCC)    Sleep apnea    Stroke [facility])     Past Surgical History:  Procedure Laterality Date   CESAREAN SECTION     CORONARY ANGIOPLASTY WITH STENT PLACEMENT Left    DILATION AND CURETTAGE OF UTERUS     ENDARTERECTOMY Left 01/29/2015   Procedure: ENDARTERECTOMY CAROTID;  Surgeon: Annice Needy, MD;  Location: [facility] ORS;  Service: Vascular;  Laterality: Left;   LOWER EXTREMITY ANGIOGRAPHY Left 09/24/2020   Procedure: Lower Extremity Angiography;  Surgeon: Annice Needy, MD;  Location: [facility] INVASIVE CV LAB;  Service: Cardiovascular;  Laterality: Left;   PERIPHERAL VASCULAR CATHETERIZATION Right 05/27/2015   Procedure: Lower Extremity Angiography;  Surgeon: Annice Needy, MD;  Location: [facility] INVASIVE CV LAB;  Service: Cardiovascular;  Laterality: Right;   PERIPHERAL VASCULAR CATHETERIZATION  05/27/2015   Procedure: Lower Extremity Intervention;  Surgeon: Annice Needy, MD;  Location: [facility] INVASIVE CV LAB;  Service: Cardiovascular;;   PERIPHERAL VASCULAR CATHETERIZATION Right 09/22/2016   Procedure: Lower Extremity Angiography;  Surgeon: Annice Needy, MD;  Location: [facility] INVASIVE CV LAB;  Service: Cardiovascular;  Laterality: Right;   TUBAL LIGATION      There were no vitals filed for this visit.   Subjective Assessment - 12/01/21 0930     Subjective Pt reports that she is doing well today. No changes since the last therapy session. She denies pain upon arrival. Pt brought her RLE prosthetic with her today.    Pertinent History Pt is a 61 y.o. 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 PMH includes:  anemia, anxiety, arthritis, CHF, COPD, CAD, diabetes, fibromyalgia, gastric reflux, hypertension,CAD,OSA,PVD s/p right BKA, with history of recent admission to [facility] 12/4-12/11/21 for left foot cellulitis s due to infected laceration. Pt reports having her R BKA in either 2016 or 2018 and received a prosthesis but has not worn it since then due to some fear. Also endorsing L knee pain she contributes to arthritis. Pt wanting to improve her strength and mobiltiy in hopes of receiving a new prosthetic. Pt relies on powerchair for current mobility with scoot transfers for toileting, to bed, etc. Denies any falls with transfers. Pt reports being able to stand but does  not do if often. Pt has not walked since her amputation. Pt's goal is to improve strength, mobility and be able to walk.    Limitations House hold activities;Lifting;Standing;Walking    Currently in Pain? No/denies                   TREATMENT   Ther-ex  Supine quad/glut sets with 3s hold x 10; Supine SLR hip flexion 2 x 10 BLE, manual resistance on RLE; Supine R hip long axis IR and ER with manual resistance 2 x 15 each; Supine straight leg hip abduction x10, x15 RLE, with manual resistance;  Supine straight leg hip adduction x10, x15 RLE, with manual resistance; Supine  bent knee manually resisted clam 2 x 15 BLE; Supine bent knee manually resisted adduction 2 x 15 BLE; Hooklying knee bridges over bolster with arms at side 2 x 10; Seated RLE LAQ with manual resistance 2 x 15; Seated RLE HS curl with manual resistance 2 x 15' Sit to stand with BUE support on walker and mat table elevated 2 x 5;   Pt educated throughout session about proper posture and technique with exercises. Improved exercise technique, movement at target joints, use of target muscles after min to mod verbal, visual, tactile cues.    Pt demonstrates excellent motivation during session. Progressed number of repetitions during strengthening today. Continued to focused on supine and seated strengthening and repeated sit to stands. Pt continues to be easily fatigued during strengthening. Reinforced importance of HEP. Pt will benefit from PT services to address deficits in strength, balance, and mobility in order to improve function at home.                           PT Short Term Goals - 11/16/21 1049       PT SHORT TERM GOAL #1   Title Pt will be independent with HEP to improve strength and AROM deficits to optimize standing, transfer and walking tolerance.    Baseline 11/16/21: Established    Time 4    Period Weeks    Status New    Target Date 12/14/21      PT SHORT TERM GOAL #2   Title Pt will improve standing tolerance to >/= 30 sec with RW/SW for skin integrity, hip stability, and in prep for stand pivot transfers/pre gait activities.    Baseline 11/16/21: 10.66 sec with RW    Time 4    Period Weeks    Status New    Target Date 12/14/21               PT Long Term Goals - 11/16/21 1053       PT LONG TERM GOAL #1   Title Pt will improve FOTO to target score of 43 to display clinically significant improvement in functional mobility.    Baseline 11/16/21: 43    Time 8    Period Weeks    Status New    Target Date 01/11/22      PT LONG TERM GOAL #2    Title Pt will improve R and L knee extension by >/= 10 degrees to assist in quad/hip stability with standing transfers.    Baseline 11/16/21: Lacking 20 deg on RLE and 10 deg on LLE.    Time 8    Period Weeks    Status New    Target Date 01/11/22      PT LONG TERM GOAL #3  Title Pt will perform stand pivot transfers with LRAD to/from powerchair with CGA to improve tolerance for upright mobility and functional strengthening.    Baseline 11/16/21: Performing scoot transfers from powerchair to other surfaces    Time 8    Period Weeks    Status New    Target Date 01/11/22      PT LONG TERM GOAL #4   Title Pt will be able to perform hop to gait with RW 10' to demo safe, short household navigation distances.    Baseline 11/16/21: Standing at RW only    Time 8    Period Weeks    Status New    Target Date 01/11/22                   Plan - 12/01/21 0939     Clinical Impression Statement Pt demonstrates excellent motivation during session. Progressed number of repetitions during strengthening today. Continued to focused on supine and seated strengthening and repeated sit to stands. Pt continues to be easily fatigued during strengthening. Reinforced importance of HEP. Pt will benefit from PT services to address deficits in strength, balance, and mobility in order to improve function at home.    Personal Factors and Comorbidities Age;Comorbidity 3+;Education;Time since onset of injury/illness/exacerbation;Past/Current Experience    Comorbidities anemia, anxiety, arthritis, CHF, COPD, CAD, diabetes, fibromyalgia, gastric reflux, hypertension,CAD,OSA,PVD    Examination-Activity Limitations Bathing;Hygiene/Grooming;Bed Mobility;Stairs;Risk analyst;Toileting;Continence;Dressing    Examination-Participation Restrictions Cleaning;Community Activity;Meal Prep;Driving    Stability/Clinical Decision Making Evolving/Moderate complexity    Rehab Potential Poor    PT  Frequency 2x / week    PT Duration 8 weeks    PT Treatment/Interventions ADLs/Self Care Home Management;DME Instruction;Gait training;Functional mobility training;Therapeutic activities;Therapeutic exercise;Balance training;Neuromuscular re-education;Patient/family education;Prosthetic Training;Orthotic Fit/Training;Wheelchair mobility training;Passive range of motion;Energy conservation    PT Next Visit Plan Review HEP, practice transfers    PT Home Exercise Plan Access Code [email or web address]    Consulted and Agree with Plan of Care Patient;Family member/caregiver    Family Member Consulted Significant other             Patient will benefit from skilled therapeutic intervention in order to improve the following deficits and impairments:  Decreased knowledge of use of DME, Pain, Cardiopulmonary status limiting activity, Decreased mobility, Postural dysfunction, Decreased activity tolerance, Decreased endurance, Decreased range of motion, Decreased strength, Impaired perceived functional ability, Decreased balance, Decreased safety awareness, Difficulty walking, Impaired flexibility  Visit Diagnosis: Difficulty in walking, not elsewhere classified  Muscle weakness (generalized)     Problem List Patient Active Problem List   Diagnosis Date Noted   Atherosclerosis of native arteries of extremity with intermittent claudication (HCC) 01/20/2021   Hyperlipidemia 01/20/2021   GERD (gastroesophageal reflux disease) 01/20/2021   Cellulitis 09/23/2020   Diabetic foot infection (HCC) 09/23/2020   Open toe wound 12/20/2016   Lower limb ulcer, calf, right, limited to breakdown of skin (HCC) 11/22/2016   PAD (peripheral artery disease) (HCC) 10/20/2016   Diabetes (HCC) 09/13/2016   Essential hypertension, benign 09/13/2016   Morbid obesity (HCC) 09/13/2016   Atherosclerosis of native arteries of the extremities with ulceration (HCC) 09/13/2016   Tobacco use disorder 09/13/2016   Carotid artery  obstruction 01/29/2015   Sharalyn Ink Dylon Correa PT, DPT, GCS  Zaccheaus Storlie, PT 12/01/2021, 12:09 PM  [facility] [address] [place], [place], [address] Phone: [phone]   Fax:  [phone]  Name: ENIJAH FURR MRN: [id number] Date of Birth: Apr 25, 1961

## 2021-12-05 NOTE — Therapy (Signed)
[facility] [address] [place], [place], [address] Phone: [phone]   Fax:  [phone]  Physical Therapy Treatment  Patient Details  Name: [name] MRN: [id number] Date of Birth: 01/22/1961 Referring Provider (PT): Sandrea Hughsubio, Jessica   Encounter Date: 12/06/2021   PT End of Session - 12/06/21 1400     Visit Number 4    Number of Visits 17    Date for PT Re-Evaluation 01/11/22    PT Start Time 0932    PT Stop Time 1012    PT Time Calculation (min) 40 min    Equipment Utilized During Treatment Gait belt    Activity Tolerance Patient limited by fatigue    Behavior During Therapy WFL for tasks assessed/performed              Past Medical History:  Diagnosis Date   Anemia    Anxiety    Arthritis    CHF (congestive heart failure) (HCC)    COPD (chronic obstructive pulmonary disease) (HCC)    Coronary artery disease    Diabetes mellitus without complication (HCC)    Fibromyalgia    GERD (gastroesophageal reflux disease)    Hypertension    Peripheral vascular disease (HCC)    Sleep apnea    Stroke [facility](HCC)     Past Surgical History:  Procedure Laterality Date   CESAREAN SECTION     CORONARY ANGIOPLASTY WITH STENT PLACEMENT Left    DILATION AND CURETTAGE OF UTERUS     ENDARTERECTOMY Left 01/29/2015   Procedure: ENDARTERECTOMY CAROTID;  Surgeon: Annice NeedyJason S Dew, MD;  Location: [facility] ORS;  Service: Vascular;  Laterality: Left;   LOWER EXTREMITY ANGIOGRAPHY Left 09/24/2020   Procedure: Lower Extremity Angiography;  Surgeon: Annice Needyew, Cashae Weich S, MD;  Location: [facility] IN[facility];  Service: Cardiovascular;  Laterality: Left;   PERIPHERAL VASCULAR CATHETERIZATION Right 05/27/2015   Procedure: Lower Extremity Angiography;  Surgeon: Annice NeedyJason S Dew, MD;  Location: [facility] INV[facility] CV LAB;  Service: Cardiovascular;  Laterality: Right;   PERIPHERAL VASCULAR CATHETERIZATION  05/27/2015   Procedure: Lower Extremity Intervention;  Surgeon: Annice NeedyJason S  Dew, MD;  Location: [facility] INV[facility] CV LAB;  Service: Cardiovascular;;   PERIPHERAL VASCULAR CATHETERIZATION Right 09/22/2016   Procedure: Lower Extremity Angiography;  Surgeon: Annice NeedyJason S Dew, MD;  Location: [facility] INV[facility]IVE CV LAB;  Service: Cardiovascular;  Laterality: Right;   TUBAL LIGATION      There were no vitals filed for this visit.   Subjective Assessment - 12/06/21 0950     Subjective Pt reports that she is doing well today. No changes since the last therapy session. She states that she did "a few" of her HEP over the weekend and did her exercises this morning. She denies pain upon arrival. Pt brought an extra RLE liner with her today.    Pertinent History Pt is a 61 y.o. 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 PMH includes:  anemia, anxiety, arthritis, CHF, COPD, CAD, diabetes, fibromyalgia, gastric reflux, hypertension,CAD,OSA,PVD s/p right BKA, with history of [facility]21 for left foot cellulitis s due to infected laceration. Pt reports having her R BKA in either 2016 or 2018 and received a prosthesis but has not worn it since then due to some fear. Also endorsing L knee pain she contributes to arthritis. Pt wanting to improve her strength and mobiltiy in hopes of receiving a new prosthetic. Pt relies on powerchair for current mobility  with scoot transfers for toileting, to bed, etc. Denies any falls with transfers. Pt reports being able to stand but does not do if often. Pt has not walked since her amputation. Pt's goal is to improve strength, mobility and be able to walk.    Limitations House hold activities;Lifting;Standing;Walking    Currently in Pain? No/denies                    TREATMENT   Ther-ex  Started in the parallel bars for donning prosthetic and possible standing. Donned liner on the residual RLE and attempted to don the prosthetic however the prosthetic does not fit and causes pain when attempted to put on patient  so discontinued;  Supine quad/glut sets with 3s hold x 10; Supine R SLR hip flexion with manual resistance x 20; Supine R hip long axis IR and ER with manual resistance x 20 each; Supine R straight leg hip abduction x 20 with manual resistance;  Supine R straight leg hip adduction x 20 with manual resistance; Hooklying knee bridges over bolster with arms at side x 20; Seated manually resisted clam x 20 BLE; Seated manually resisted adduction x 20 BLE; Seated RLE LAQ with manual resistance 2 x 20; Seated RLE HS curl with manual resistance 2 x 20; Attempted to practice standing tolerance and she is able to stand for 10s during the first bout and only 5s during the second bout, pt unable to tolerate due to increase in L knee pain; Attempted repeat sit to stands but only able to perform 2 reps before stopping secondary to increase in L knee pain.    Pt educated throughout session about proper posture and technique with exercises. Improved exercise technique, movement at target joints, use of target muscles after min to mod verbal, visual, tactile cues.    Pt demonstrates excellent motivation during session. Started in the parallel bars for donning prosthetic and possible standing. Donned liner on the residual RLE and attempted to don the prosthetic however the prosthetic does not fit and causes pain when attempted to put on patient so discontinued. Continued with mat table strengthening during session and increased reps to 20 during each set. Attempted standing endurance and repeated sit to stands but pt unable to complete due to increase in L knee. Pt reports history of chronic L knee pain and she last had a knee injection 5 years ago. Reinforced importance of HEP. Pt will benefit from PT services to address deficits in strength, balance, and mobility in order to improve function at home.                           PT Short Term Goals - 11/16/21 1049       PT SHORT TERM GOAL  #1   Title Pt will be independent with HEP to improve strength and AROM deficits to optimize standing, transfer and walking tolerance.    Baseline 11/16/21: Established    Time 4    Period Weeks    Status New    Target Date 12/14/21      PT SHORT TERM GOAL #2   Title Pt will improve standing tolerance to >/= 30 sec with RW/SW for skin integrity, hip stability, and in prep for stand pivot transfers/pre gait activities.    Baseline 11/16/21: 10.66 sec with RW    Time 4    Period Weeks    Status New    Target Date 12/14/21  PT Long Term Goals - 11/16/21 1053       PT LONG TERM GOAL #1   Title Pt will improve FOTO to target score of 43 to display clinically significant improvement in functional mobility.    Baseline 11/16/21: 43    Time 8    Period Weeks    Status New    Target Date 01/11/22      PT LONG TERM GOAL #2   Title Pt will improve R and L knee extension by >/= 10 degrees to assist in quad/hip stability with standing transfers.    Baseline 11/16/21: Lacking 20 deg on RLE and 10 deg on LLE.    Time 8    Period Weeks    Status New    Target Date 01/11/22      PT LONG TERM GOAL #3   Title Pt will perform stand pivot transfers with LRAD to/from powerchair with CGA to improve tolerance for upright mobility and functional strengthening.    Baseline 11/16/21: Performing scoot transfers from powerchair to other surfaces    Time 8    Period Weeks    Status New    Target Date 01/11/22      PT LONG TERM GOAL #4   Title Pt will be able to perform hop to gait with RW 10' to demo safe, short household navigation distances.    Baseline 11/16/21: Standing at RW only    Time 8    Period Weeks    Status New    Target Date 01/11/22                   Plan - 12/06/21 1400     Clinical Impression Statement Pt demonstrates excellent motivation during session. Started in the parallel bars for donning prosthetic and possible standing. Donned liner on the  residual RLE and attempted to don the prosthetic however the prosthetic does not fit and causes pain when attempted to put on patient so discontinued. Continued with mat table strengthening during session and increased reps to 20 during each set. Attempted standing endurance and repeated sit to stands but pt unable to complete due to increase in L knee. Pt reports history of chronic L knee pain and she last had a knee injection 5 years ago. Reinforced importance of HEP. Pt will benefit from PT services to address deficits in strength, balance, and mobility in order to improve function at home.    Personal Factors and Comorbidities Age;Comorbidity 3+;Education;Time since onset of injury/illness/exacerbation;Past/Current Experience    Comorbidities anemia, anxiety, arthritis, CHF, COPD, CAD, diabetes, fibromyalgia, gastric reflux, hypertension,CAD,OSA,PVD    Examination-Activity Limitations Bathing;Hygiene/Grooming;Bed Mobility;Stairs;Risk analyst;Toileting;Continence;Dressing    Examination-Participation Restrictions Cleaning;Community Activity;Meal Prep;Driving    Stability/Clinical Decision Making Evolving/Moderate complexity    Rehab Potential Poor    PT Frequency 2x / week    PT Duration 8 weeks    PT Treatment/Interventions ADLs/Self Care Home Management;DME Instruction;Gait training;Functional mobility training;Therapeutic activities;Therapeutic exercise;Balance training;Neuromuscular re-education;Patient/family education;Prosthetic Training;Orthotic Fit/Training;Wheelchair mobility training;Passive range of motion;Energy conservation    PT Next Visit Plan Review HEP, practice transfers    PT Home Exercise Plan Access Code [email or web address]    Consulted and Agree with Plan of Care Patient;Family member/caregiver    Family Member Consulted Significant other              Patient will benefit from skilled therapeutic intervention in order to improve the following deficits and  impairments:  Decreased knowledge of use of DME, Pain, Cardiopulmonary status limiting  activity, Decreased mobility, Postural dysfunction, Decreased activity tolerance, Decreased endurance, Decreased range of motion, Decreased strength, Impaired perceived functional ability, Decreased balance, Decreased safety awareness, Difficulty walking, Impaired flexibility  Visit Diagnosis: Difficulty in walking, not elsewhere classified  Muscle weakness (generalized)     Problem List Patient Active Problem List   Diagnosis Date Noted   Atherosclerosis of native arteries of extremity with intermittent claudication (HCC) 01/20/2021   Hyperlipidemia 01/20/2021   GERD (gastroesophageal reflux disease) 01/20/2021   Cellulitis 09/23/2020   Diabetic foot infection (HCC) 09/23/2020   Open toe wound 12/20/2016   Lower limb ulcer, calf, right, limited to breakdown of skin (HCC) 11/22/2016   PAD (peripheral artery disease) (HCC) 10/20/2016   Diabetes (HCC) 09/13/2016   Essential hypertension, benign 09/13/2016   Morbid obesity (HCC) 09/13/2016   Atherosclerosis of native arteries of the extremities with ulceration (HCC) 09/13/2016   Tobacco use disorder 09/13/2016   Carotid artery obstruction 01/29/2015   Lynnea Maizes PT, DPT, GCS  Virlee Stroschein, PT 12/06/2021, 2:08 PM  [facility] [address] [place], [place], [address] Phone: [phone]   Fax:  [phone]  Name: [name] MRN: [id number] Date of Birth: 1961-03-31

## 2021-12-06 ENCOUNTER — Other Ambulatory Visit: Payer: Self-pay

## 2021-12-06 ENCOUNTER — Ambulatory Visit: Payer: Medicaid Other

## 2021-12-06 DIAGNOSIS — R262 Difficulty in walking, not elsewhere classified: Secondary | ICD-10-CM | POA: Diagnosis not present

## 2021-12-06 DIAGNOSIS — M6281 Muscle weakness (generalized): Secondary | ICD-10-CM

## 2021-12-07 NOTE — Therapy (Signed)
?[facility] ?[address] [place], [place], [address] ?Phone: [phone]   Fax:  [phone] ? ?Physical Therapy Treatment ? ?Patient Details  ?Name: [name] ?MRN: [id number] ?Date of Birth: 1961/09/07 ?Referring Provider (PT): Sandrea Hughs ? ? ?Encounter Date: 12/08/2021 ? ? PT End of Session - 12/08/21 1103   ? ? Visit Number 5   ? Number of Visits 17   ? Date for PT Re-Evaluation 01/11/22   ? PT Start Time 0930   ? PT Stop Time 1010   ? PT Time Calculation (min) 40 min   ? Equipment Utilized During Treatment Gait belt   ? Activity Tolerance Patient limited by fatigue   ? Behavior During Therapy [facility] for tasks assessed/performed   ? ?  ?  ? ?  ? ? ? ? ?Past Medical History:  ?Diagnosis Date  ? Anemia   ? Anxiety   ? Arthritis   ? CHF (congestive heart failure) (HCC)   ? COPD (chronic obstructive pulmonary disease) (HCC)   ? Coronary artery disease   ? Diabetes mellitus without complication (HCC)   ? Fibromyalgia   ? GERD (gastroesophageal reflux disease)   ? Hypertension   ? Peripheral vascular disease (HCC)   ? Sleep apnea   ? Stroke [facility])   ? ? ?Past Surgical History:  ?Procedure Laterality Date  ? CESAREAN SECTION    ? CORONARY ANGIOPLASTY WITH STENT PLACEMENT Left   ? DILATION AND CURETTAGE OF UTERUS    ? ENDARTERECTOMY Left 01/29/2015  ? Procedure: ENDARTERECTOMY CAROTID;  Surgeon: Annice Needy, MD;  Location: [facility] ORS;  Service: Vascular;  Laterality: Left;  ? LOWER EXTREMITY ANGIOGRAPHY Left 09/24/2020  ? Procedure: Lower Extremity Angiography;  Surgeon: Annice Needy, MD;  Location: [facility];  Service: Cardiovascular;  Laterality: Left;  ? PERIPHERAL VASCULAR CATHETERIZATION Right 05/27/2015  ? Procedure: Lower Extremity Angiography;  Surgeon: Annice Needy, MD;  Location: [facility];  Service: Cardiovascular;  Laterality: Right;  ? PERIPHERAL VASCULAR CATHETERIZATION  05/27/2015  ? Procedure: Lower Extremity Intervention;  Surgeon: Annice Needy, MD;  Location: [facility];  Service: Cardiovascular;;  ? PERIPHERAL VASCULAR CATHETERIZATION Right 09/22/2016  ? Procedure: Lower Extremity Angiography;  Surgeon: Annice Needy, MD;  Location: [facility];  Service: Cardiovascular;  Laterality: Right;  ? TUBAL LIGATION    ? ? ?There were no vitals filed for this visit. ? ? Subjective Assessment - 12/08/21 1102   ? ? Subjective Pt reports that she is very tired today. No changes since the last therapy session. She is complaining of some dorsal L foot pain upon arrival today which is intermittent. No specific questions currently.   ? Pertinent History Pt is a 61 y.o. 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 PMH includes:  anemia, anxiety, arthritis, CHF, COPD, CAD, diabetes, fibromyalgia, gastric reflux, hypertension,CAD,OSA,PVD s/p right BKA, with history of recent admission to [facility] 12/4-12/11/21 for left foot cellulitis s due to infected laceration. Pt reports having her R BKA in either 2016 or 2018 and received a prosthesis but has not worn it since then due to some fear. Also endorsing L knee pain she contributes to arthritis. Pt wanting to improve her strength and mobiltiy in hopes of receiving a new prosthetic. Pt relies on powerchair for current mobility with scoot transfers for toileting, to bed, etc. Denies any falls with transfers.  Pt reports being able to stand but does not do if often. Pt has not walked since her amputation. Pt's goal is to improve strength, mobility and be able to walk.   ? Limitations House hold activities;Lifting;Standing;Walking   ? ?  ?  ? ?  ? ? ? ? ? ? ?TREATMENT ? ? ?Ther-ex  ?Examined L foot due to reports of pain. Once foot is cleaned no obvious signs of infection or ulceration. Notable ulcer on the lateral forefoot which is chronic and fully healed. Pt does have redness in her L lower leg consistent with PVD. Foot is warm to the touch. Unable to palpate DP or PT pulses. Pt encouraged  to monitor foot pain and if signs of infection arise contact MD. ?Supine R SLR hip flexion with manual resistance x 20; ?Supine R hip long axis IR and ER with manual resistance x 20 each; ?Supine R straight leg hip abduction x 20 with manual resistance;  ?Supine R straight leg hip adduction x 20 with manual resistance; ?Hooklying knee bridges over bolster with arms at side 2 x 10; ?Hooklying manually resisted clam x 20 BLE; ?Hooklying manually resisted adduction x 20 BLE; ?Seated RLE LAQ with manual resistance 2 x 20; ?Seated RLE HS curl with manual resistance 2 x 20; ?Attempted repeat sit to stands but pt only able to perform 2 reps before stopping secondary to increase in L knee pain.  ? ? ?Pt educated throughout session about proper posture and technique with exercises. Improved exercise technique, movement at target joints, use of target muscles after min to mod verbal, visual, tactile cues.  ? ? ?Examined L foot due to reports of L foot pain upon arrival. Once foot is cleaned no obvious signs of infection or ulceration. Notable ulcer on the L lateral forefoot which is chronic and fully healed. Pt does have redness in her L lower leg consistent with PVD. Foot is warm to the touch and no signs of active infection. Unable to palpate DP or PT pulses. Pt encouraged to monitor foot pain and if signs of infection arise contact MD. Continued with mat table strengthening and again attempted repeat sit to stands but  pt is unable to complete secondary to increase in L knee pain. Pt encouraged to contact [facility] to return for possible repeat L knee steroid injection (last injection 5 years ago). Reinforced importance of HEP. Pt will benefit from PT services to address deficits in strength, balance, and mobility in order to improve function at home.  ? ? ? ? ? ? ? ? ? ? ? ? ? ? PT Short Term Goals - 11/16/21 1049   ? ?  ? PT SHORT TERM GOAL #1  ? Title Pt will be independent with HEP to improve strength and  AROM deficits to optimize standing, transfer and walking tolerance.   ? Baseline 11/16/21: Established   ? Time 4   ? Period Weeks   ? Status New   ? Target Date 12/14/21   ?  ? PT SHORT TERM GOAL #2  ? Title Pt will improve standing tolerance to >/= 30 sec with RW/SW for skin integrity, hip stability, and in prep for stand pivot transfers/pre gait activities.   ? Baseline 11/16/21: 10.66 sec with RW   ? Time 4   ? Period Weeks   ? Status New   ? Target Date 12/14/21   ? ?  ?  ? ?  ? ? ? ? PT Long Term  Goals - 11/16/21 1053   ? ?  ? PT LONG TERM GOAL #1  ? Title Pt will improve FOTO to target score of 43 to display clinically significant improvement in functional mobility.   ? Baseline 11/16/21: 43   ? Time 8   ? Period Weeks   ? Status New   ? Target Date 01/11/22   ?  ? PT LONG TERM GOAL #2  ? Title Pt will improve R and L knee extension by >/= 10 degrees to assist in quad/hip stability with standing transfers.   ? Baseline 11/16/21: Lacking 20 deg on RLE and 10 deg on LLE.   ? Time 8   ? Period Weeks   ? Status New   ? Target Date 01/11/22   ?  ? PT LONG TERM GOAL #3  ? Title Pt will perform stand pivot transfers with LRAD to/from powerchair with CGA to improve tolerance for upright mobility and functional strengthening.   ? Baseline 11/16/21: Performing scoot transfers from powerchair to other surfaces   ? Time 8   ? Period Weeks   ? Status New   ? Target Date 01/11/22   ?  ? PT LONG TERM GOAL #4  ? Title Pt will be able to perform hop to gait with RW 10' to demo safe, short household navigation distances.   ? Baseline 11/16/21: Standing at RW only   ? Time 8   ? Period Weeks   ? Status New   ? Target Date 01/11/22   ? ?  ?  ? ?  ? ? ? ? ? ? ? ? Plan - 12/08/21 1103   ? ? Clinical Impression Statement Examined L foot due to reports of L foot pain upon arrival. Once foot is cleaned no obvious signs of infection or ulceration. Notable ulcer on the L lateral forefoot which is chronic and fully healed. Pt does have  redness in her L lower leg consistent with PVD. Foot is warm to the touch and no signs of active infection. Unable to palpate DP or PT pulses. Pt encouraged to monitor foot pain and if signs of infection arise

## 2021-12-08 ENCOUNTER — Other Ambulatory Visit: Payer: Self-pay

## 2021-12-08 ENCOUNTER — Ambulatory Visit: Payer: Medicaid Other

## 2021-12-08 DIAGNOSIS — R262 Difficulty in walking, not elsewhere classified: Secondary | ICD-10-CM | POA: Diagnosis not present

## 2021-12-08 DIAGNOSIS — M6281 Muscle weakness (generalized): Secondary | ICD-10-CM

## 2021-12-13 ENCOUNTER — Ambulatory Visit: Payer: Medicaid Other

## 2021-12-15 ENCOUNTER — Ambulatory Visit: Payer: Medicaid Other

## 2021-12-15 NOTE — Therapy (Signed)
[facility] ?[address]. [place], [place], [address] ?Phone: [phone]   Fax:  [phone] ? ?Physical Therapy Treatment ? ?Patient Details  ?Name: [name] ?MRN: [id number] ?Date of Birth: [birth year] ?Referring Provider (PT): Sandrea Hughs ? ? ?Encounter Date: 12/16/2021 ? ? PT End of Session - 12/16/21 1038   ? ? Visit Number 6   ? Number of Visits 17   ? Date for PT Re-Evaluation 01/11/22   ? PT Start Time 1015   ? PT Stop Time 1100   ? PT Time Calculation (min) 45 min   ? Equipment Utilized During Treatment Gait belt   ? Activity Tolerance Patient limited by fatigue   ? Behavior During Therapy [name] for tasks assessed/performed   ? ?  ?  ? ?  ? ? ?Past Medical History:  ?Diagnosis Date  ? Anemia   ? Anxiety   ? Arthritis   ? CHF (congestive heart failure) (HCC)   ? COPD (chronic obstructive pulmonary disease) (HCC)   ? Coronary artery disease   ? Diabetes mellitus without complication (HCC)   ? Fibromyalgia   ? GERD (gastroesophageal reflux disease)   ? Hypertension   ? Peripheral vascular disease (HCC)   ? Sleep apnea   ? Stroke [facility])   ? ? ?Past Surgical History:  ?Procedure Laterality Date  ? CESAREAN SECTION    ? CORONARY ANGIOPLASTY WITH STENT PLACEMENT Left   ? DILATION AND CURETTAGE OF UTERUS    ? ENDARTERECTOMY Left 01/29/2015  ? Procedure: ENDARTERECTOMY CAROTID;  Surgeon: Annice Needy, MD;  Location: [facility] ORS;  Service: Vascular;  Laterality: Left;  ? LOWER EXTREMITY ANGIOGRAPHY Left 09/24/2020  ? Procedure: Lower Extremity Angiography;  Surgeon: Annice Needy, MD;  Location: [facility] INVASIVE CV LAB;  Service: Cardiovascular;  Laterality: Left;  ? PERIPHERAL VASCULAR CATHETERIZATION Right 05/27/2015  ? Procedure: Lower Extremity Angiography;  Surgeon: Annice Needy, MD;  Location: [facility] INVASIVE CV LAB;  Service: Cardiovascular;  Laterality: Right;  ? PERIPHERAL VASCULAR CATHETERIZATION  05/27/2015  ? Procedure: Lower Extremity Intervention;  Surgeon: Annice Needy, MD;  Location: [facility] INVASIVE CV LAB;  Service: Cardiovascular;;  ? PERIPHERAL VASCULAR CATHETERIZATION Right 09/22/2016  ? Procedure: Lower Extremity Angiography;  Surgeon: Annice Needy, MD;  Location: [facility] INVASIVE CV LAB;  Service: Cardiovascular;  Laterality: Right;  ? TUBAL LIGATION    ? ? ?There were no vitals filed for this visit. ? ? Subjective Assessment - 12/16/21 1033   ? ? Subjective Pt reports that she is very tired again today. She did not sleep well last night and is still waiting for a replacement CPAP mask to arrive. No significant changes since the last therapy session. L foot pain resolved. No specific questions currently.   ? Pertinent History Pt is a 61 y.o. 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 PMH includes:  anemia, anxiety, arthritis, CHF, COPD, CAD, diabetes, fibromyalgia, gastric reflux, hypertension,CAD,OSA,PVD s/p right BKA, with history of recent admission to [facility] 12/4-12/11/21 for left foot cellulitis s due to infected laceration. Pt reports having her R BKA in either 2016 or 2018 and received a prosthesis but has not worn it since then due to some fear. Also endorsing L knee pain she contributes to arthritis. Pt wanting to improve her strength and mobiltiy in hopes of receiving a new prosthetic. Pt relies on powerchair for current mobility with scoot transfers for toileting, to  bed, etc. Denies any falls with transfers. Pt reports being able to stand but does not do if often. Pt has not walked since her amputation. Pt's goal is to improve strength, mobility and be able to walk.   ? Limitations House hold activities;Lifting;Standing;Walking   ? ?  ?  ? ?  ? ? ? ? ?TREATMENT ? ? ?Gait Training ?Practiced donning RLE prosthesis which fits today with considerable effort; ?Practiced sit to stand transfers in // bars with RLE prosthesis including prolonged standing and repositioning feet; ?Standing weight shifts in // bars with BUE support with pt progressively  loading RLE; ?Gait training with chair follow in // bars with BUE support. Verbal cues from therapist for proper sequencing leading with RLE during step-to pattern. Pt is able to ambulate 7' in the bars but is considerably fatigued afterwards; ? ? ?Ther-ex  ?Supine R SLR hip flexion with manual resistance x 20; ?Supine R hip long axis IR and ER with manual resistance x 20 each; ?Supine R straight leg hip abduction x 20 with manual resistance;  ?Supine R straight leg hip adduction x 20 with manual resistance; ? ? ?Pt educated throughout session about proper posture and technique with exercises. Improved exercise technique, movement at target joints, use of target muscles after min to mod verbal, visual, tactile cues.  ? ? ?Attempted to don RLE prosthesis again today and with considerable effort it fits today. Practiced sit to stand transfers in // bars with RLE prosthesis including prolonged standing, standing weight shifts, and progressively loading RLE. Afterward performed gait training with chair follow in // bars with BUE support and step-to pattern. Pt is able to ambulate 7' in the bars but is considerably fatigued afterwards. Proceeded after gait training to mat table strengthening. Reinforced importance of HEP. Pt will benefit from PT services to address deficits in strength, balance, and mobility in order to improve function at home.  ? ? ? ? ? ? ? ? ? ? ? ? ? ? PT Short Term Goals - 11/16/21 1049   ? ?  ? PT SHORT TERM GOAL #1  ? Title Pt will be independent with HEP to improve strength and AROM deficits to optimize standing, transfer and walking tolerance.   ? Baseline 11/16/21: Established   ? Time 4   ? Period Weeks   ? Status New   ? Target Date 12/14/21   ?  ? PT SHORT TERM GOAL #2  ? Title Pt will improve standing tolerance to >/= 30 sec with RW/SW for skin integrity, hip stability, and in prep for stand pivot transfers/pre gait activities.   ? Baseline 11/16/21: 10.66 sec with RW   ? Time 4   ? Period  Weeks   ? Status New   ? Target Date 12/14/21   ? ?  ?  ? ?  ? ? ? ? PT Long Term Goals - 11/16/21 1053   ? ?  ? PT LONG TERM GOAL #1  ? Title Pt will improve FOTO to target score of 43 to display clinically significant improvement in functional mobility.   ? Baseline 11/16/21: 43   ? Time 8   ? Period Weeks   ? Status New   ? Target Date 01/11/22   ?  ? PT LONG TERM GOAL #2  ? Title Pt will improve R and L knee extension by >/= 10 degrees to assist in quad/hip stability with standing transfers.   ? Baseline 11/16/21: Lacking 20 deg on  RLE and 10 deg on LLE.   ? Time 8   ? Period Weeks   ? Status New   ? Target Date 01/11/22   ?  ? PT LONG TERM GOAL #3  ? Title Pt will perform stand pivot transfers with LRAD to/from powerchair with CGA to improve tolerance for upright mobility and functional strengthening.   ? Baseline 11/16/21: Performing scoot transfers from powerchair to other surfaces   ? Time 8   ? Period Weeks   ? Status New   ? Target Date 01/11/22   ?  ? PT LONG TERM GOAL #4  ? Title Pt will be able to perform hop to gait with RW 10' to demo safe, short household navigation distances.   ? Baseline 11/16/21: Standing at RW only   ? Time 8   ? Period Weeks   ? Status New   ? Target Date 01/11/22   ? ?  ?  ? ?  ? ? ? ? ? ? ? ? Plan - 12/16/21 1242   ? ? Clinical Impression Statement Attempted to don RLE prosthesis again today and with considerable effort it fits today. Practiced sit to stand transfers in // bars with RLE prosthesis including prolonged standing, standing weight shifts, and progressively loading RLE. Afterward performed gait training with chair follow in // bars with BUE support and step-to pattern. Pt is able to ambulate 7' in the bars but is considerably fatigued afterwards. Proceeded after gait training to mat table strengthening. Reinforced importance of HEP. Pt will benefit from PT services to address deficits in strength, balance, and mobility in order to improve function at home.   ? Personal  Factors and Comorbidities Age;Comorbidity 3+;Education;Time since onset of injury/illness/exacerbation;Past/Current Experience   ? Comorbidities anemia, anxiety, arthritis, CHF, COPD, CAD, diabetes, fibromya

## 2021-12-16 ENCOUNTER — Ambulatory Visit: Payer: Medicaid Other

## 2021-12-16 ENCOUNTER — Other Ambulatory Visit: Payer: Self-pay

## 2021-12-16 DIAGNOSIS — R262 Difficulty in walking, not elsewhere classified: Secondary | ICD-10-CM | POA: Diagnosis not present

## 2021-12-16 DIAGNOSIS — M6281 Muscle weakness (generalized): Secondary | ICD-10-CM

## 2021-12-16 DIAGNOSIS — R269 Unspecified abnormalities of gait and mobility: Secondary | ICD-10-CM

## 2021-12-20 ENCOUNTER — Ambulatory Visit: Payer: Medicaid Other

## 2021-12-22 ENCOUNTER — Other Ambulatory Visit: Payer: Self-pay

## 2021-12-22 ENCOUNTER — Ambulatory Visit: Payer: Medicaid Other

## 2021-12-22 DIAGNOSIS — M6281 Muscle weakness (generalized): Secondary | ICD-10-CM

## 2021-12-22 DIAGNOSIS — R262 Difficulty in walking, not elsewhere classified: Secondary | ICD-10-CM | POA: Diagnosis not present

## 2021-12-22 NOTE — Therapy (Signed)
?OUTPATIENT PHYSICAL THERAPY TREATMENT NOTE ? ? ?Patient Name: [name] ?MRN: [id number] ?DOB:04/24/1961, 61 y.o., female ?Today's Date: 12/22/2021 ? ?PCP: Sandrea Hughsubio, Jessica, NP ?REFERRING PROVIDER: Sandrea Hughsubio, Jessica, NP ? ? PT End of Session - 12/22/21 [id number]   ? ? Visit Number 7   ? Number of Visits 17   ? Date for PT Re-Evaluation 01/11/22   ? PT Start Time 0930   ? PT Stop Time 1015   ? PT Time Calculation (min) 45 min   ? Equipment Utilized During Treatment Gait belt   ? Activity Tolerance Patient limited by fatigue   ? [facility] for tasks assessed/performed   ? ?  ?  ? ?  ? ? ?Past Medical History:  ?Diagnosis Date  ? Anemia   ? Anxiety   ? Arthritis   ? CHF (congestive heart failure) (HCC)   ? COPD (chronic obstructive pulmonary disease) (HCC)   ? Coronary artery disease   ? Diabetes mellitus without complication (HCC)   ? Fibromyalgia   ? GERD (gastroesophageal reflux disease)   ? Hypertension   ? Peripheral vascular disease (HCC)   ? Sleep apnea   ? Stroke [facility](HCC)   ? ?Past Surgical History:  ?Procedure Laterality Date  ? CESAREAN SECTION    ? CORONARY ANGIOPLASTY WITH STENT PLACEMENT Left   ? DILATION AND CURETTAGE OF UTERUS    ? ENDARTERECTOMY Left 01/29/2015  ? Procedure: ENDARTERECTOMY CAROTID;  Surgeon: Annice NeedyJason S Dew, MD;  Location: [facility] ORS;  Service: Vascular;  Laterality: Left;  ? LOWER EXTREMITY ANGIOGRAPHY Left 09/24/2020  ? Procedure: Lower Extremity Angiography;  Surgeon: Annice Needyew, Loranzo Desha S, MD;  Location: [facility] INVASIVE CV LAB;  Service: Cardiovascular;  Laterality: Left;  ? PERIPHERAL VASCULAR CATHETERIZATION Right 05/27/2015  ? Procedure: Lower Extremity Angiography;  Surgeon: Annice NeedyJason S Dew, MD;  Location: [facility] INVASIVE CV LAB;  Service: Cardiovascular;  Laterality: Right;  ? PERIPHERAL VASCULAR CATHETERIZATION  05/27/2015  ? Procedure: Lower Extremity Intervention;  Surgeon: Annice NeedyJason S Dew, MD;  Location: [facility] INVASIVE CV LAB;  Service: Cardiovascular;;  ? PERIPHERAL VASCULAR CATHETERIZATION Right  09/22/2016  ? Procedure: Lower Extremity Angiography;  Surgeon: Annice NeedyJason S Dew, MD;  Location: [facility] INVASIVE CV LAB;  Service: Cardiovascular;  Laterality: Right;  ? TUBAL LIGATION    ? ?Patient Active Problem List  ? Diagnosis Date Noted  ? Atherosclerosis of native arteries of extremity with intermittent claudication (HCC) 01/20/2021  ? Hyperlipidemia 01/20/2021  ? GERD (gastroesophageal reflux disease) 01/20/2021  ? Cellulitis 09/23/2020  ? Diabetic foot infection (HCC) 09/23/2020  ? Open toe wound 12/20/2016  ? Lower limb ulcer, calf, right, limited to breakdown of skin (HCC) 11/22/2016  ? PAD (peripheral artery disease) (HCC) 10/20/2016  ? Diabetes (HCC) 09/13/2016  ? Essential hypertension, benign 09/13/2016  ? Morbid obesity (HCC) 09/13/2016  ? Atherosclerosis of native arteries of the extremities with ulceration (HCC) 09/13/2016  ? Tobacco use disorder 09/13/2016  ? Carotid artery obstruction 01/29/2015  ? ? ?REFERRING DIAG: Z89.511 (ICD-10-CM) - Hx of amputation below knee, right (HCC) ? ?THERAPY DIAG: Difficulty in walking, not elsewhere classified ? ?Muscle weakness (generalized) ? ?PERTINENT HISTORY: Pt is a 61 y.o. female referred to OP PT for treatment after R BKA on /21 . PMH includes: anemia, anxiety, arthritis, CHF, COPD, CAD, diabetes, fibromyalgia, gastric reflux, hypertension,CAD,OSA,PVD s/p right BKA, with history of [facility]12[facility]11[facility] for left foot cellulitis s due to infected laceration. Pt reports having her R BKA in either 2016  or 2018 and received a prosthesis but has not worn it since then due to some fear. Also endorsing L knee pain she contributes to arthritis. Pt wanting to improve her strength and mobiltiy in hopes of receiving a new prosthetic. Pt relies on powerchair for current mobility with scoot transfers for toileting, to bed, etc. Denies any falls with transfers. Pt reports being able to stand but does not do if often. Pt has not walked since her amputation.  Pt's goal is to improve strength, mobility and be able to walk. ? ?PRECAUTIONS: Fall ? ?SUBJECTIVE: Pt reports that she "woke up on the wrong side of the bed this morning." No significant changes since the last therapy session. No specific questions currently. ? ?PAIN:  ?Are you having pain? No ? ? ? ?TODAY'S TREATMENT:  ? ? ?Gait Training ?Practiced donning RLE prosthesis which fits today again however with considerable effort; ?Practiced sit to stand transfers in // bars with RLE prosthesis including prolonged standing and repositioning feet. Also practiced one 180 degree turn in parallel bars to walk back the opposite directions in the bars. ?Standing weight shifts in // bars with BUE support with pt progressively loading RLE; ?Gait training with chair follow in // bars with BUE support. Verbal cues from therapist for proper sequencing leading with RLE during step-to pattern. Pt is able to ambulate approximately 14' today total in the bars over multiple attempts. She does require multiple seated rest breaks. Utilized walker in // bars for approximately half of the distance in order to work on eventual progression outside of the parallel bars; ? ?Ther-ex  ?Seated bilateral hip flexion marches 2 x 15 BLE; ?Seated bilateral alternating LAQ 2 x 15 BLE; ?Sit to stand with BUE support on // bars x 10, x 5, pt fatigue during second set and is only able to complete 5 repetitions.  ? ? ?PATIENT EDUCATION: ?Education details: Pt educated throughout session about proper posture and technique with exercises. Improved exercise technique, movement at target joints, use of target muscles after min to mod verbal, visual, tactile cues.  ?Person educated: Patient ?Education method: Explanation ?Education comprehension: verbalized understanding and returned demonstration ? ? ?HOME EXERCISE PROGRAM: ?Access Code [email or web address]  ? ? ? ? PT Short Term Goals  ? ?  ? PT SHORT TERM GOAL #1  ? Title Pt will be independent with HEP to improve  strength and AROM deficits to optimize standing, transfer and walking tolerance.   ? Baseline 11/16/21: Established   ? Time 4   ? Period Weeks   ? Status New   ? Target Date 12/14/21   ?  ? PT SHORT TERM GOAL #2  ? Title Pt will improve standing tolerance to >/= 30 sec with RW/SW for skin integrity, hip stability, and in prep for stand pivot transfers/pre gait activities.   ? Baseline 11/16/21: 10.66 sec with RW   ? Time 4   ? Period Weeks   ? Status New   ? Target Date 12/14/21   ? ?  ?  ? ?  ? ? ? PT Long Term Goals   ? ?  ? PT LONG TERM GOAL #1  ? Title Pt will improve FOTO to target score of 43 to display clinically significant improvement in functional mobility.   ? Baseline 11/16/21: 43   ? Time 8   ? Period Weeks   ? Status New   ? Target Date 01/11/22   ?  ? PT LONG TERM GOAL #2  ?  Title Pt will improve R and L knee extension by >/= 10 degrees to assist in quad/hip stability with standing transfers.   ? Baseline 11/16/21: Lacking 20 deg on RLE and 10 deg on LLE.   ? Time 8   ? Period Weeks   ? Status New   ? Target Date 01/11/22   ?  ? PT LONG TERM GOAL #3  ? Title Pt will perform stand pivot transfers with LRAD to/from powerchair with CGA to improve tolerance for upright mobility and functional strengthening.   ? Baseline 11/16/21: Performing scoot transfers from powerchair to other surfaces   ? Time 8   ? Period Weeks   ? Status New   ? Target Date 01/11/22   ?  ? PT LONG TERM GOAL #4  ? Title Pt will be able to perform hop to gait with RW 10' to demo safe, short household navigation distances.   ? Baseline 11/16/21: Standing at RW only   ? Time 8   ? Period Weeks   ? Status New   ? Target Date 01/11/22   ? ?  ?  ? ?  ? ? ? Plan   ? ? Clinical Impression Statement Pt able to don prosthesis again today so bulk of session focused on practicing transfers and gait in the parallel bars. Practiced sit to stand transfers in // bars with RLE prosthesis including prolonged standing, standing weight shifts, and  progressively loading RLE. Afterward performed gait training with chair follow in // bars with BUE support and step-to pattern. Pt is able to ambulate approximately 14' today total in the bars over multiple attempts

## 2021-12-27 ENCOUNTER — Ambulatory Visit: Payer: Medicaid Other | Attending: Primary Care

## 2021-12-27 DIAGNOSIS — M6281 Muscle weakness (generalized): Secondary | ICD-10-CM | POA: Insufficient documentation

## 2021-12-27 DIAGNOSIS — R269 Unspecified abnormalities of gait and mobility: Secondary | ICD-10-CM | POA: Diagnosis present

## 2021-12-27 DIAGNOSIS — R262 Difficulty in walking, not elsewhere classified: Secondary | ICD-10-CM | POA: Insufficient documentation

## 2021-12-27 NOTE — Therapy (Signed)
?OUTPATIENT PHYSICAL THERAPY TREATMENT NOTE ? ? ?Patient Name: [name] ?MRN: [id number] ?DOB:Feb 20, 1961, 61 y.o., female ?Today's Date: 12/27/2021 ? ?PCP: Freddy Finner, NP ?REFERRING PROVIDER: Freddy Finner, NP ? ? PT End of Session - 12/27/21 0932   ? ? Visit Number 8   ? Number of Visits 17   ? Date for PT Re-Evaluation 01/11/22   ? PT Start Time [phone]   ? PT Stop Time 1015   ? PT Time Calculation (min) 42 min   ? Equipment Utilized During Treatment Gait belt   ? Activity Tolerance Patient limited by fatigue   ? Behavior During Therapy [facility] for tasks assessed/performed   ? ?  ?  ? ?  ? ? ?Past Medical History:  ?Diagnosis Date  ? Anemia   ? Anxiety   ? Arthritis   ? CHF (congestive heart failure) ([place])   ? COPD (chronic obstructive pulmonary disease) ([place])   ? Coronary artery disease   ? Diabetes mellitus without complication ([place])   ? Fibromyalgia   ? GERD (gastroesophageal reflux disease)   ? Hypertension   ? Peripheral vascular disease ([place])   ? Sleep apnea   ? Stroke [facility])   ? ?Past Surgical History:  ?Procedure Laterality Date  ? CESAREAN SECTION    ? CORONARY ANGIOPLASTY WITH STENT PLACEMENT Left   ? DILATION AND CURETTAGE OF UTERUS    ? ENDARTERECTOMY Left 01/29/2015  ? Procedure: ENDARTERECTOMY CAROTID;  Surgeon: Algernon Huxley, MD;  Location: [facility] ORS;  Service: Vascular;  Laterality: Left;  ? LOWER EXTREMITY ANGIOGRAPHY Left 09/24/2020  ? Procedure: Lower Extremity Angiography;  Surgeon: Algernon Huxley, MD;  Location: [place] [facility];  Service: Cardiovascular;  Laterality: Left;  ? PERIPHERAL VASCULAR CATHETERIZATION Right 05/27/2015  ? Procedure: Lower Extremity Angiography;  Surgeon: Algernon Huxley, MD;  Location: [place] [facility];  Service: Cardiovascular;  Laterality: Right;  ? PERIPHERAL VASCULAR CATHETERIZATION  05/27/2015  ? Procedure: Lower Extremity Intervention;  Surgeon: Algernon Huxley, MD;  Location: [place] [facility];  Service: Cardiovascular;;  ? PERIPHERAL VASCULAR CATHETERIZATION Right  09/22/2016  ? Procedure: Lower Extremity Angiography;  Surgeon: Algernon Huxley, MD;  Location: [place] [facility];  Service: Cardiovascular;  Laterality: Right;  ? TUBAL LIGATION    ? ?Patient Active Problem List  ? Diagnosis Date Noted  ? Atherosclerosis of native arteries of extremity with intermittent claudication ([place]) 01/20/2021  ? Hyperlipidemia 01/20/2021  ? GERD (gastroesophageal reflux disease) 01/20/2021  ? Cellulitis 09/23/2020  ? Diabetic foot infection ([place]) 09/23/2020  ? Open toe wound 12/20/2016  ? Lower limb ulcer, calf, right, limited to breakdown of skin ([place]) 11/22/2016  ? PAD (peripheral artery disease) ([place]) 10/20/2016  ? Diabetes ([place]) 09/13/2016  ? Essential hypertension, benign 09/13/2016  ? Morbid obesity ([place]) 09/13/2016  ? Atherosclerosis of native arteries of the extremities with ulceration ([place]) 09/13/2016  ? Tobacco use disorder 09/13/2016  ? Carotid artery obstruction 01/29/2015  ? ? ?REFERRING DIAG: Z89.511 (ICD-10-CM) - Hx of amputation below knee, right ([place]) ? ?THERAPY DIAG: Difficulty in walking, not elsewhere classified ? ?Muscle weakness (generalized) ? ?Abnormality of gait and mobility ? ?PERTINENT HISTORY: Pt is a 61 y.o. female referred to OP PT for treatment after R BKA on /21 . PMH includes: anemia, anxiety, arthritis, CHF, COPD, CAD, diabetes, fibromyalgia, gastric reflux, hypertension,CAD,OSA,PVD s/p right BKA, with history of recent admission to [facility] 12/4-12/11/21 for left foot cellulitis s due to infected laceration. Pt reports having  her R BKA in either 2016 or 2018 and received a prosthesis but has not worn it since then due to some fear. Also endorsing L knee pain she contributes to arthritis. Pt wanting to improve her strength and mobiltiy in hopes of receiving a new prosthetic. Pt relies on powerchair for current mobility with scoot transfers for toileting, to bed, etc. Denies any falls with transfers. Pt reports being able to stand but does not do if often. Pt has  not walked since her amputation. Pt's goal is to improve strength, mobility and be able to walk. ? ?PRECAUTIONS: Fall ? ?SUBJECTIVE: Pt reports that she "woke up on the wrong side of the bed this morning." No significant changes since the last therapy session. No specific questions currently. ? ?PAIN:  ?Are you having pain? No ? ? ?TODAY'S TREATMENT:  ? ? ?Gait Training ?Practiced donning RLE prosthesis which fits today again however with considerable effort; ?Standing weight shifts in // bars with BUE support with pt progressively loading RLE; ?Standing marches in // bars with BUE support in order to increase WB through RLE; ?Gait training with chair follow in // bars with BUE support. Verbal cues from therapist for proper sequencing leading with RLE during step-to pattern. Pt is able to ambulate approximately 28' today total in the bars over multiple attempts (12', 6', 6', 6' (RW), 6' (RW). She does require multiple seated rest breaks. Plan is to work on eventual progression outside of the parallel bars for short distance ambulation; ? ? ?Therapeutic Activity ?Practiced standing transfers wheelchair to/from NuStep with front wheeled walker. Pt requires extensive cues for proper sequencing and CGA from therapist;   ?Once on NuStep pt is able to perform 5 minutes at level 0 with cues from therapist for proper steps per minute. Pt reporting fatigue; ?Practiced standing transfer from wheelchair into car with front wheeled walker. Extensive cues for proper sequencing with walker and therapist providing CGA.  ? ? ?PATIENT EDUCATION: ?Education details: Pt educated throughout session about proper posture and technique with exercises. Improved exercise technique, movement at target joints, use of target muscles after min to mod verbal, visual, tactile cues.  ?Person educated: Patient ?Education method: Explanation ?Education comprehension: verbalized understanding and returned demonstration ? ? ?HOME EXERCISE  PROGRAM: ?Access Code [email or web address]  ? ? ? ? PT Short Term Goals  ? ?  ? PT SHORT TERM GOAL #1  ? Title Pt will be independent with HEP to improve strength and AROM deficits to optimize standing, transfer and walking tolerance.   ? Baseline 11/16/21: Established   ? Time 4   ? Period Weeks   ? Status New   ? Target Date 12/14/21   ?  ? PT SHORT TERM GOAL #2  ? Title Pt will improve standing tolerance to >/= 30 sec with RW/SW for skin integrity, hip stability, and in prep for stand pivot transfers/pre gait activities.   ? Baseline 11/16/21: 10.66 sec with RW   ? Time 4   ? Period Weeks   ? Status New   ? Target Date 12/14/21   ? ?  ?  ? ?  ? ? ? PT Long Term Goals   ? ?  ? PT LONG TERM GOAL #1  ? Title Pt will improve FOTO to target score of 43 to display clinically significant improvement in functional mobility.   ? Baseline 11/16/21: 43   ? Time 8   ? Period Weeks   ? Status New   ?  Target Date 01/11/22   ?  ? PT LONG TERM GOAL #2  ? Title Pt will improve R and L knee extension by >/= 10 degrees to assist in quad/hip stability with standing transfers.   ? Baseline 11/16/21: Lacking 20 deg on RLE and 10 deg on LLE.   ? Time 8   ? Period Weeks   ? Status New   ? Target Date 01/11/22   ?  ? PT LONG TERM GOAL #3  ? Title Pt will perform stand pivot transfers with LRAD to/from powerchair with CGA to improve tolerance for upright mobility and functional strengthening.   ? Baseline 11/16/21: Performing scoot transfers from powerchair to other surfaces   ? Time 8   ? Period Weeks   ? Status New   ? Target Date 01/11/22   ?  ? PT LONG TERM GOAL #4  ? Title Pt will be able to perform hop to gait with RW 10' to demo safe, short household navigation distances.   ? Baseline 11/16/21: Standing at [place] only   ? Time 8   ? Period Weeks   ? Status New   ? Target Date 01/11/22   ? ?  ?  ? ?  ? ? ? Plan   ? ? Clinical Impression Statement Pt able to don prosthesis again today so bulk of session focused on practicing transfers and gait in the  parallel bars. Pt able to perform gait training with chair follow in parallel bars with BUE support. Verbal cues from therapist for proper sequencing leading with RLE during step-to pattern. Pt is able to ambulate appr

## 2021-12-28 NOTE — Therapy (Addendum)
?OUTPATIENT PHYSICAL THERAPY TREATMENT NOTE/GOAL UPDATE/RECERTIFICATION ? ? ?Patient Name: [name] ?MRN: [id number] ?DOB:12/18/1960, 61 y.o., female ?Today's Date: 12/29/2021 ? ?PCP: Freddy Finner, NP ?REFERRING PROVIDER: Freddy Finner, NP ? ? PT End of Session - 12/29/21 0926   ? ? Visit Number 9   ? Number of Visits 33   ? Date for PT Re-Evaluation 02/23/22   ? PT Start Time 0930   ? PT Stop Time 1015   ? PT Time Calculation (min) 45 min   ? Equipment Utilized During Treatment Gait belt   ? Activity Tolerance Patient limited by fatigue   ? Behavior During Therapy [facility] for tasks assessed/performed   ? ?  ?  ? ?  ? ? ? ?Past Medical History:  ?Diagnosis Date  ? Anemia   ? Anxiety   ? Arthritis   ? CHF (congestive heart failure) ([place])   ? COPD (chronic obstructive pulmonary disease) ([place])   ? Coronary artery disease   ? Diabetes mellitus without complication ([place])   ? Fibromyalgia   ? GERD (gastroesophageal reflux disease)   ? Hypertension   ? Peripheral vascular disease ([place])   ? Sleep apnea   ? Stroke [facility])   ? ?Past Surgical History:  ?Procedure Laterality Date  ? CESAREAN SECTION    ? CORONARY ANGIOPLASTY WITH STENT PLACEMENT Left   ? DILATION AND CURETTAGE OF UTERUS    ? ENDARTERECTOMY Left 01/29/2015  ? Procedure: ENDARTERECTOMY CAROTID;  Surgeon: Algernon Huxley, MD;  Location: [facility] ORS;  Service: Vascular;  Laterality: Left;  ? LOWER EXTREMITY ANGIOGRAPHY Left 09/24/2020  ? Procedure: Lower Extremity Angiography;  Surgeon: Algernon Huxley, MD;  Location: [place] [facility];  Service: Cardiovascular;  Laterality: Left;  ? PERIPHERAL VASCULAR CATHETERIZATION Right 05/27/2015  ? Procedure: Lower Extremity Angiography;  Surgeon: Algernon Huxley, MD;  Location: [place] [facility];  Service: Cardiovascular;  Laterality: Right;  ? PERIPHERAL VASCULAR CATHETERIZATION  05/27/2015  ? Procedure: Lower Extremity Intervention;  Surgeon: Algernon Huxley, MD;  Location: [place] [facility];  Service: Cardiovascular;;  ? PERIPHERAL  VASCULAR CATHETERIZATION Right 09/22/2016  ? Procedure: Lower Extremity Angiography;  Surgeon: Algernon Huxley, MD;  Location: [place] [facility];  Service: Cardiovascular;  Laterality: Right;  ? TUBAL LIGATION    ? ?Patient Active Problem List  ? Diagnosis Date Noted  ? Atherosclerosis of native arteries of extremity with intermittent claudication ([place]) 01/20/2021  ? Hyperlipidemia 01/20/2021  ? GERD (gastroesophageal reflux disease) 01/20/2021  ? Cellulitis 09/23/2020  ? Diabetic foot infection ([place]) 09/23/2020  ? Open toe wound 12/20/2016  ? Lower limb ulcer, calf, right, limited to breakdown of skin ([place]) 11/22/2016  ? PAD (peripheral artery disease) ([place]) 10/20/2016  ? Diabetes ([place]) 09/13/2016  ? Essential hypertension, benign 09/13/2016  ? Morbid obesity ([place]) 09/13/2016  ? Atherosclerosis of native arteries of the extremities with ulceration ([place]) 09/13/2016  ? Tobacco use disorder 09/13/2016  ? Carotid artery obstruction 01/29/2015  ? ? ?REFERRING DIAG: Z89.511 (ICD-10-CM) - Hx of amputation below knee, right ([place]) ? ?THERAPY DIAG: Difficulty in walking, not elsewhere classified ? ?Muscle weakness (generalized) ? ?PERTINENT HISTORY: Pt is a 61 y.o. female referred to OP PT for treatment after R BKA on /21 . PMH includes: anemia, anxiety, arthritis, CHF, COPD, CAD, diabetes, fibromyalgia, gastric reflux, hypertension,CAD,OSA,PVD s/p right BKA, with history of recent admission to [facility] 12/4-12/11/21 for left foot cellulitis s due to infected laceration. Pt reports having her R BKA in  either 2016 or 2018 and received a prosthesis but has not worn it since then due to some fear. Also endorsing L knee pain she contributes to arthritis. Pt wanting to improve her strength and mobiltiy in hopes of receiving a new prosthetic. Pt relies on powerchair for current mobility with scoot transfers for toileting, to bed, etc. Denies any falls with transfers. Pt reports being able to stand but does not do if often. Pt has not  walked since her amputation. Pt's goal is to improve strength, mobility and be able to walk. ? ?PRECAUTIONS: Fall ? ?SUBJECTIVE: Pt reports that she has been having some back pain recently. No resting pain upon arrival but pain occurs with specific motions. No significant changes since the last therapy session. No specific questions currently. ? ?PAIN:  ?Are you having pain? No ? ? ?TODAY'S TREATMENT:  ? ? ?Ther-ex  ?Supine R SLR hip flexion with manual resistance 2 x 15; ?Supine R hip long axis IR and ER with manual resistance 2 x 15 each; ?Supine R straight leg hip abduction 2 x 15 with manual resistance;  ?Supine R straight leg hip adduction 2 x 15 with manual resistance; ?Hooklying bolster bridges 2 x 10; ?Seated R LAQ with manual resistance from therapist x 15; ?Seated R HS curls with manual resistance from therapist; ?NuStep L0-1 x 8 minutes with cues from therapist to keep spm>50 and therapist monitoring fatigue. Pt requires one rest break halfway through time; ? ? ?Therapeutic Activity ?Updated outcome measures and goals (see goal section); ?FOTO: 39 ?Knee extension range of motion: R: -10 degrees, L: -10 degrees; ? ?Practiced standing transfers wheelchair to/from NuStep with front wheeled walker. Pt requires extensive cues for proper sequencing and CGA/minA+1 from therapist;   ?Practiced standing transfer from wheelchair into car with front wheeled walker. Extensive cues for proper sequencing with walker and therapist providing CGA/minA+1.  ? ? ?PATIENT EDUCATION: ?Education details: Pt educated throughout session about proper posture and technique with exercises. Improved exercise technique, movement at target joints, use of target muscles after min to mod verbal, visual, tactile cues.  ?Person educated: Patient ?Education method: Explanation ?Education comprehension: verbalized understanding and returned demonstration ? ? ?HOME EXERCISE PROGRAM: ?Access Code [email or web address]  ? ? ? ? PT Short Term Goals  ? ?  ?  PT SHORT TERM GOAL #1  ? Title Pt will be independent with HEP to improve strength and AROM deficits to optimize standing, transfer and walking tolerance.   ? Baseline 11/16/21: Established   ? Time 4   ? Period Weeks   ? Status Partially met  ? Target Date 01/26/2022    ?  ? PT SHORT TERM GOAL #2  ? Title Pt will improve standing tolerance to >/= 30 sec with RW/SW for skin integrity, hip stability, and in prep for stand pivot transfers/pre gait activities.   ? Baseline 11/16/21: 10.66 sec with RW, 12/29/21: Pt has been able to stand for >30s over the last few sessions during transfers and ambulation training;   ? Time 4   ? Period Weeks   ? Status Achieved  ? Target Date   ? ?  ?  ? ?  ? ? ? PT Long Term Goals   ? ?  ? PT LONG TERM GOAL #1  ? Title Pt will improve FOTO to target score of 43 to display clinically significant improvement in functional mobility.   ? Baseline 11/16/21: 34, 12/29/21: 39  ? Time 8   ? Period  Weeks   ? Status  Partially Met  ? Target Date 02/23/2022   ?  ? PT LONG TERM GOAL #2  ? Title Pt will improve R and L knee extension by >/= 10 degrees to assist in quad/hip stability with standing transfers.   ? Baseline 11/16/21: Lacking 20 deg on RLE and 10 deg on LLE. 12/29/21: Lacking 10 degrees bilateral knees  ? Time 8   ? Period Weeks   ? Status Achieved  ? Target Date   ?  ? PT LONG TERM GOAL #3  ? Title Pt will perform stand pivot transfers with LRAD to/from powerchair with CGA to improve tolerance for upright mobility and functional strengthening.   ? Baseline 11/16/21: Performing scoot transfers from powerchair to other surfaces, 12/29/21: CGA/minA+1 for stand pivot transfers    ? Time 8   ? Period Weeks   ? Status Partially met  ? Target Date 02/23/2022   ?  ? PT LONG TERM GOAL #4  ? Title Pt will be able to perform hop to gait with RW 10' to demo safe, short household navigation distances.   ? Baseline 11/16/21: Standing at [place] only, 12/29/21: Able to ambulate short distances (<10') with RW and RLE  prosthesis with CGA/minA+1 in parallel bars  ? Time 8   ? Period Weeks   ? Status Partially met  ? Target Date 02/23/2022   ? ?  ?  ? ?  ? ? ? Plan   ? ? Clinical Impression Statement Updated outcome measures/goals

## 2021-12-29 ENCOUNTER — Ambulatory Visit: Payer: Medicaid Other

## 2021-12-29 DIAGNOSIS — R262 Difficulty in walking, not elsewhere classified: Secondary | ICD-10-CM | POA: Diagnosis not present

## 2021-12-29 DIAGNOSIS — M6281 Muscle weakness (generalized): Secondary | ICD-10-CM

## 2021-12-29 NOTE — Addendum Note (Signed)
Addended by: Ria Comment D on: 12/29/2021 02:06 PM ? ? Modules accepted: Orders ? ?

## 2021-12-31 NOTE — Therapy (Incomplete)
?OUTPATIENT PHYSICAL THERAPY TREATMENT NOTE/GOAL UPDATE/RECERTIFICATION ? ? ?Patient Name: [name] ?MRN: [id number] ?DOB:06/18/1961, 61 y.o., female ?Today's Date: 12/31/2021 ? ?PCP: Freddy Finner, NP ?REFERRING PROVIDER: Freddy Finner, NP ? ? ? ? ? ?Past Medical History:  ?Diagnosis Date  ? Anemia   ? Anxiety   ? Arthritis   ? CHF (congestive heart failure) ([place])   ? COPD (chronic obstructive pulmonary disease) ([place])   ? Coronary artery disease   ? Diabetes mellitus without complication ([place])   ? Fibromyalgia   ? GERD (gastroesophageal reflux disease)   ? Hypertension   ? Peripheral vascular disease ([place])   ? Sleep apnea   ? Stroke [facility])   ? ?Past Surgical History:  ?Procedure Laterality Date  ? CESAREAN SECTION    ? CORONARY ANGIOPLASTY WITH STENT PLACEMENT Left   ? DILATION AND CURETTAGE OF UTERUS    ? ENDARTERECTOMY Left 01/29/2015  ? Procedure: ENDARTERECTOMY CAROTID;  Surgeon: Algernon Huxley, MD;  Location: [facility] ORS;  Service: Vascular;  Laterality: Left;  ? LOWER EXTREMITY ANGIOGRAPHY Left 09/24/2020  ? Procedure: Lower Extremity Angiography;  Surgeon: Algernon Huxley, MD;  Location: [place] [facility];  Service: Cardiovascular;  Laterality: Left;  ? PERIPHERAL VASCULAR CATHETERIZATION Right 05/27/2015  ? Procedure: Lower Extremity Angiography;  Surgeon: Algernon Huxley, MD;  Location: [place] [facility];  Service: Cardiovascular;  Laterality: Right;  ? PERIPHERAL VASCULAR CATHETERIZATION  05/27/2015  ? Procedure: Lower Extremity Intervention;  Surgeon: Algernon Huxley, MD;  Location: [place] [facility];  Service: Cardiovascular;;  ? PERIPHERAL VASCULAR CATHETERIZATION Right 09/22/2016  ? Procedure: Lower Extremity Angiography;  Surgeon: Algernon Huxley, MD;  Location: [place] [facility];  Service: Cardiovascular;  Laterality: Right;  ? TUBAL LIGATION    ? ?Patient Active Problem List  ? Diagnosis Date Noted  ? Atherosclerosis of native arteries of extremity with intermittent claudication ([place]) 01/20/2021  ?  Hyperlipidemia 01/20/2021  ? GERD (gastroesophageal reflux disease) 01/20/2021  ? Cellulitis 09/23/2020  ? Diabetic foot infection ([place]) 09/23/2020  ? Open toe wound 12/20/2016  ? Lower limb ulcer, calf, right, limited to breakdown of skin ([place]) 11/22/2016  ? PAD (peripheral artery disease) ([place]) 10/20/2016  ? Diabetes ([place]) 09/13/2016  ? Essential hypertension, benign 09/13/2016  ? Morbid obesity ([place]) 09/13/2016  ? Atherosclerosis of native arteries of the extremities with ulceration ([place]) 09/13/2016  ? Tobacco use disorder 09/13/2016  ? Carotid artery obstruction 01/29/2015  ? ? ?REFERRING DIAG: Z89.511 (ICD-10-CM) - Hx of amputation below knee, right ([place]) ? ?THERAPY DIAG: Difficulty in walking, not elsewhere classified ? ?Muscle weakness (generalized) ? ?PERTINENT HISTORY: Pt is a 61 y.o. female referred to OP PT for treatment after R BKA on /21 . PMH includes: anemia, anxiety, arthritis, CHF, COPD, CAD, diabetes, fibromyalgia, gastric reflux, hypertension,CAD,OSA,PVD s/p right BKA, with history of recent admission to [facility] 12/4-12/11/21 for left foot cellulitis s due to infected laceration. Pt reports having her R BKA in either 2016 or 2018 and received a prosthesis but has not worn it since then due to some fear. Also endorsing L knee pain she contributes to arthritis. Pt wanting to improve her strength and mobiltiy in hopes of receiving a new prosthetic. Pt relies on powerchair for current mobility with scoot transfers for toileting, to bed, etc. Denies any falls with transfers. Pt reports being able to stand but does not do if often. Pt has not walked since her amputation. Pt's goal is to improve strength, mobility and  be able to walk. ? ?PRECAUTIONS: Fall ? ?SUBJECTIVE: Pt reports that she has been having some back pain recently. No resting pain upon arrival but pain occurs with specific motions. No significant changes since the last therapy session. No specific questions currently. ? ?PAIN:  ?Are you  having pain? No ? ? ?TODAY'S TREATMENT:  ? ? ?Ther-ex  ?Supine R SLR hip flexion with manual resistance 2 x 15; ?Supine R hip long axis IR and ER with manual resistance 2 x 15 each; ?Supine R straight leg hip abduction 2 x 15 with manual resistance;  ?Supine R straight leg hip adduction 2 x 15 with manual resistance; ?Hooklying bolster bridges 2 x 10; ?Seated R LAQ with manual resistance from therapist x 15; ?Seated R HS curls with manual resistance from therapist; ?NuStep L0-1 x 8 minutes with cues from therapist to keep spm>50 and therapist monitoring fatigue. Pt requires one rest break halfway through time; ? ? ?Therapeutic Activity ?Updated outcome measures and goals (see goal section); ?FOTO: 39 ?Knee extension range of motion: R: -10 degrees, L: -10 degrees; ? ?Practiced standing transfers wheelchair to/from NuStep with front wheeled walker. Pt requires extensive cues for proper sequencing and CGA/minA+1 from therapist;   ?Practiced standing transfer from wheelchair into car with front wheeled walker. Extensive cues for proper sequencing with walker and therapist providing CGA/minA+1.  ? ? ?PATIENT EDUCATION: ?Education details: Pt educated throughout session about proper posture and technique with exercises. Improved exercise technique, movement at target joints, use of target muscles after min to mod verbal, visual, tactile cues.  ?Person educated: Patient ?Education method: Explanation ?Education comprehension: verbalized understanding and returned demonstration ? ? ?HOME EXERCISE PROGRAM: ?Access Code [email or web address]  ? ? ? ? PT Short Term Goals  ? ?  ? PT SHORT TERM GOAL #1  ? Title Pt will be independent with HEP to improve strength and AROM deficits to optimize standing, transfer and walking tolerance.   ? Baseline 11/16/21: Established   ? Time 4   ? Period Weeks   ? Status Partially met  ? Target Date 01/26/2022    ?  ? PT SHORT TERM GOAL #2  ? Title Pt will improve standing tolerance to >/= 30 sec with RW/SW  for skin integrity, hip stability, and in prep for stand pivot transfers/pre gait activities.   ? Baseline 11/16/21: 10.66 sec with RW, 12/29/21: Pt has been able to stand for >30s over the last few sessions during transfers and ambulation training;   ? Time 4   ? Period Weeks   ? Status Achieved  ? Target Date   ? ?  ?  ? ?  ? ? ? PT Long Term Goals   ? ?  ? PT LONG TERM GOAL #1  ? Title Pt will improve FOTO to target score of 43 to display clinically significant improvement in functional mobility.   ? Baseline 11/16/21: 34, 12/29/21: 39  ? Time 8   ? Period Weeks   ? Status  Partially Met  ? Target Date 02/23/2022   ?  ? PT LONG TERM GOAL #2  ? Title Pt will improve R and L knee extension by >/= 10 degrees to assist in quad/hip stability with standing transfers.   ? Baseline 11/16/21: Lacking 20 deg on RLE and 10 deg on LLE. 12/29/21: Lacking 10 degrees bilateral knees  ? Time 8   ? Period Weeks   ? Status Achieved  ? Target Date   ?  ?  PT LONG TERM GOAL #3  ? Title Pt will perform stand pivot transfers with LRAD to/from powerchair with CGA to improve tolerance for upright mobility and functional strengthening.   ? Baseline 11/16/21: Performing scoot transfers from powerchair to other surfaces, 12/29/21: CGA/minA+1 for stand pivot transfers    ? Time 8   ? Period Weeks   ? Status Partially met  ? Target Date 02/23/2022   ?  ? PT LONG TERM GOAL #4  ? Title Pt will be able to perform hop to gait with RW 10' to demo safe, short household navigation distances.   ? Baseline 11/16/21: Standing at [place] only, 12/29/21: Able to ambulate short distances (<10') with RW and RLE prosthesis with CGA/minA+1 in parallel bars  ? Time 8   ? Period Weeks   ? Status Partially met  ? Target Date 02/23/2022   ? ?  ?  ? ?  ? ? ? Plan   ? ? Clinical Impression Statement Updated outcome measures/goals with patient during visit today. Her knee extension range of motion has improved to -10 degrees. Her FOTO score improved from 34 at initial evaluation to 39  today. She has been able to perform ambulation in the parallel bars with her prosthetic limited distances. She has also been able to use a rolling walker inside the parallel bars for short distances. She has been able

## 2022-01-03 ENCOUNTER — Ambulatory Visit: Payer: Medicaid Other

## 2022-01-03 DIAGNOSIS — R262 Difficulty in walking, not elsewhere classified: Secondary | ICD-10-CM

## 2022-01-03 DIAGNOSIS — M6281 Muscle weakness (generalized): Secondary | ICD-10-CM

## 2022-01-05 ENCOUNTER — Ambulatory Visit: Payer: Medicaid Other

## 2022-01-07 NOTE — Therapy (Incomplete)
?OUTPATIENT PHYSICAL THERAPY TREATMENT NOTE/PROGRESS NOTE ? ?Dates of reporting period  ***   to   01/10/22  ? ? ?Patient Name: [name] ?MRN: [id number] ?DOB:03/25/1961, 61 y.o., female ?Today's Date: 01/07/2022 ? ?PCP: Freddy Finner, NP ?REFERRING PROVIDER: Freddy Finner, NP ? ? ? ? ? ?Past Medical History:  ?Diagnosis Date  ? Anemia   ? Anxiety   ? Arthritis   ? CHF (congestive heart failure) ([place])   ? COPD (chronic obstructive pulmonary disease) ([place])   ? Coronary artery disease   ? Diabetes mellitus without complication ([place])   ? Fibromyalgia   ? GERD (gastroesophageal reflux disease)   ? Hypertension   ? Peripheral vascular disease ([place])   ? Sleep apnea   ? Stroke [facility])   ? ?Past Surgical History:  ?Procedure Laterality Date  ? CESAREAN SECTION    ? CORONARY ANGIOPLASTY WITH STENT PLACEMENT Left   ? DILATION AND CURETTAGE OF UTERUS    ? ENDARTERECTOMY Left 01/29/2015  ? Procedure: ENDARTERECTOMY CAROTID;  Surgeon: Algernon Huxley, MD;  Location: [facility] ORS;  Service: Vascular;  Laterality: Left;  ? LOWER EXTREMITY ANGIOGRAPHY Left 09/24/2020  ? Procedure: Lower Extremity Angiography;  Surgeon: Algernon Huxley, MD;  Location: [place] [facility];  Service: Cardiovascular;  Laterality: Left;  ? PERIPHERAL VASCULAR CATHETERIZATION Right 05/27/2015  ? Procedure: Lower Extremity Angiography;  Surgeon: Algernon Huxley, MD;  Location: [place] [facility];  Service: Cardiovascular;  Laterality: Right;  ? PERIPHERAL VASCULAR CATHETERIZATION  05/27/2015  ? Procedure: Lower Extremity Intervention;  Surgeon: Algernon Huxley, MD;  Location: [place] [facility];  Service: Cardiovascular;;  ? PERIPHERAL VASCULAR CATHETERIZATION Right 09/22/2016  ? Procedure: Lower Extremity Angiography;  Surgeon: Algernon Huxley, MD;  Location: [place] [facility];  Service: Cardiovascular;  Laterality: Right;  ? TUBAL LIGATION    ? ?Patient Active Problem List  ? Diagnosis Date Noted  ? Atherosclerosis of native arteries of extremity with intermittent  claudication ([place]) 01/20/2021  ? Hyperlipidemia 01/20/2021  ? GERD (gastroesophageal reflux disease) 01/20/2021  ? Cellulitis 09/23/2020  ? Diabetic foot infection ([place]) 09/23/2020  ? Open toe wound 12/20/2016  ? Lower limb ulcer, calf, right, limited to breakdown of skin ([place]) 11/22/2016  ? PAD (peripheral artery disease) ([place]) 10/20/2016  ? Diabetes ([place]) 09/13/2016  ? Essential hypertension, benign 09/13/2016  ? Morbid obesity ([place]) 09/13/2016  ? Atherosclerosis of native arteries of the extremities with ulceration ([place]) 09/13/2016  ? Tobacco use disorder 09/13/2016  ? Carotid artery obstruction 01/29/2015  ? ? ?REFERRING DIAG: Z89.511 (ICD-10-CM) - Hx of amputation below knee, right ([place]) ? ?THERAPY DIAG: Difficulty in walking, not elsewhere classified ? ?Muscle weakness (generalized) ? ?PERTINENT HISTORY: Pt is a 61 y.o. female referred to OP PT for treatment after R BKA on /21 . PMH includes: anemia, anxiety, arthritis, CHF, COPD, CAD, diabetes, fibromyalgia, gastric reflux, hypertension,CAD,OSA,PVD s/p right BKA, with history of recent admission to [facility] 12/4-12/11/21 for left foot cellulitis s due to infected laceration. Pt reports having her R BKA in either 2016 or 2018 and received a prosthesis but has not worn it since then due to some fear. Also endorsing L knee pain she contributes to arthritis. Pt wanting to improve her strength and mobiltiy in hopes of receiving a new prosthetic. Pt relies on powerchair for current mobility with scoot transfers for toileting, to bed, etc. Denies any falls with transfers. Pt reports being able to stand but does not do if often. Pt  has not walked since her amputation. Pt's goal is to improve strength, mobility and be able to walk. ? ?PRECAUTIONS: Fall ? ?SUBJECTIVE: Pt reports that she has been having some back pain recently. No resting pain upon arrival but pain occurs with specific motions. No significant changes since the last therapy session. No specific questions  currently. ? ?PAIN:  ?Are you having pain? No ? ? ?TODAY'S TREATMENT:  ? ? ?Ther-ex  ?Supine R SLR hip flexion with manual resistance 2 x 15; ?Supine R hip long axis IR and ER with manual resistance 2 x 15 each; ?Supine R straight leg hip abduction 2 x 15 with manual resistance;  ?Supine R straight leg hip adduction 2 x 15 with manual resistance; ?Hooklying bolster bridges 2 x 10; ?Seated R LAQ with manual resistance from therapist x 15; ?Seated R HS curls with manual resistance from therapist; ?NuStep L0-1 x 8 minutes with cues from therapist to keep spm>50 and therapist monitoring fatigue. Pt requires one rest break halfway through time; ? ? ?Therapeutic Activity ?Updated outcome measures and goals (see goal section); ?FOTO: 39 ?Knee extension range of motion: R: -10 degrees, L: -10 degrees; ? ?Practiced standing transfers wheelchair to/from NuStep with front wheeled walker. Pt requires extensive cues for proper sequencing and CGA/minA+1 from therapist;   ?Practiced standing transfer from wheelchair into car with front wheeled walker. Extensive cues for proper sequencing with walker and therapist providing CGA/minA+1.  ? ? ?PATIENT EDUCATION: ?Education details: Pt educated throughout session about proper posture and technique with exercises. Improved exercise technique, movement at target joints, use of target muscles after min to mod verbal, visual, tactile cues.  ?Person educated: Patient ?Education method: Explanation ?Education comprehension: verbalized understanding and returned demonstration ? ? ?HOME EXERCISE PROGRAM: ?Access Code [email or web address]  ? ? ? ? PT Short Term Goals  ? ?  ? PT SHORT TERM GOAL #1  ? Title Pt will be independent with HEP to improve strength and AROM deficits to optimize standing, transfer and walking tolerance.   ? Baseline 11/16/21: Established   ? Time 4   ? Period Weeks   ? Status Partially met  ? Target Date 01/26/2022    ?  ? PT SHORT TERM GOAL #2  ? Title Pt will improve standing  tolerance to >/= 30 sec with RW/SW for skin integrity, hip stability, and in prep for stand pivot transfers/pre gait activities.   ? Baseline 11/16/21: 10.66 sec with RW, 12/29/21: Pt has been able to stand for >30s over the last few sessions during transfers and ambulation training;   ? Time 4   ? Period Weeks   ? Status Achieved  ? Target Date   ? ?  ?  ? ?  ? ? ? PT Long Term Goals   ? ?  ? PT LONG TERM GOAL #1  ? Title Pt will improve FOTO to target score of 43 to display clinically significant improvement in functional mobility.   ? Baseline 11/16/21: 34, 12/29/21: 39  ? Time 8   ? Period Weeks   ? Status  Partially Met  ? Target Date 02/23/2022   ?  ? PT LONG TERM GOAL #2  ? Title Pt will improve R and L knee extension by >/= 10 degrees to assist in quad/hip stability with standing transfers.   ? Baseline 11/16/21: Lacking 20 deg on RLE and 10 deg on LLE. 12/29/21: Lacking 10 degrees bilateral knees  ? Time 8   ? Period Weeks   ?  Status Achieved  ? Target Date   ?  ? PT LONG TERM GOAL #3  ? Title Pt will perform stand pivot transfers with LRAD to/from powerchair with CGA to improve tolerance for upright mobility and functional strengthening.   ? Baseline 11/16/21: Performing scoot transfers from powerchair to other surfaces, 12/29/21: CGA/minA+1 for stand pivot transfers    ? Time 8   ? Period Weeks   ? Status Partially met  ? Target Date 02/23/2022   ?  ? PT LONG TERM GOAL #4  ? Title Pt will be able to perform hop to gait with RW 10' to demo safe, short household navigation distances.   ? Baseline 11/16/21: Standing at [place] only, 12/29/21: Able to ambulate short distances (<10') with RW and RLE prosthesis with CGA/minA+1 in parallel bars  ? Time 8   ? Period Weeks   ? Status Partially met  ? Target Date 02/23/2022   ? ?  ?  ? ?  ? ? ? Plan   ? ? Clinical Impression Statement Updated outcome measures/goals with patient during visit today. Her knee extension range of motion has improved to -10 degrees. Her FOTO score improved  from 34 at initial evaluation to 39 today. She has been able to perform ambulation in the parallel bars with her prosthetic limited distances. She has also been able to use a rolling walker inside the parallel bars fo

## 2022-01-10 ENCOUNTER — Ambulatory Visit: Payer: Medicaid Other

## 2022-01-10 DIAGNOSIS — R262 Difficulty in walking, not elsewhere classified: Secondary | ICD-10-CM

## 2022-01-10 DIAGNOSIS — M6281 Muscle weakness (generalized): Secondary | ICD-10-CM

## 2022-01-12 ENCOUNTER — Ambulatory Visit: Payer: Medicaid Other

## 2022-02-08 NOTE — Therapy (Incomplete)
?OUTPATIENT PHYSICAL THERAPY TREATMENT NOTE/PROGRESS NOTE ? ?Dates of reporting period  ***   to   01/10/22  ? ? ?Patient Name: [name] ?MRN: [id number] ?DOB:02-25-1961, 61 y.o., female ?Today's Date: 02/08/2022 ? ?PCP: Freddy Finner, NP ?REFERRING PROVIDER: Freddy Finner, NP ? ? ? ? ? ?Past Medical History:  ?Diagnosis Date  ? Anemia   ? Anxiety   ? Arthritis   ? CHF (congestive heart failure) ([place])   ? COPD (chronic obstructive pulmonary disease) ([place])   ? Coronary artery disease   ? Diabetes mellitus without complication ([place])   ? Fibromyalgia   ? GERD (gastroesophageal reflux disease)   ? Hypertension   ? Peripheral vascular disease ([place])   ? Sleep apnea   ? Stroke [facility])   ? ?Past Surgical History:  ?Procedure Laterality Date  ? CESAREAN SECTION    ? CORONARY ANGIOPLASTY WITH STENT PLACEMENT Left   ? DILATION AND CURETTAGE OF UTERUS    ? ENDARTERECTOMY Left 01/29/2015  ? Procedure: ENDARTERECTOMY CAROTID;  Surgeon: Algernon Huxley, MD;  Location: [facility] ORS;  Service: Vascular;  Laterality: Left;  ? LOWER EXTREMITY ANGIOGRAPHY Left 09/24/2020  ? Procedure: Lower Extremity Angiography;  Surgeon: Algernon Huxley, MD;  Location: [place] [facility];  Service: Cardiovascular;  Laterality: Left;  ? PERIPHERAL VASCULAR CATHETERIZATION Right 05/27/2015  ? Procedure: Lower Extremity Angiography;  Surgeon: Algernon Huxley, MD;  Location: [place] [facility];  Service: Cardiovascular;  Laterality: Right;  ? PERIPHERAL VASCULAR CATHETERIZATION  05/27/2015  ? Procedure: Lower Extremity Intervention;  Surgeon: Algernon Huxley, MD;  Location: [place] [facility];  Service: Cardiovascular;;  ? PERIPHERAL VASCULAR CATHETERIZATION Right 09/22/2016  ? Procedure: Lower Extremity Angiography;  Surgeon: Algernon Huxley, MD;  Location: [place] [facility];  Service: Cardiovascular;  Laterality: Right;  ? TUBAL LIGATION    ? ?Patient Active Problem List  ? Diagnosis Date Noted  ? Atherosclerosis of native arteries of extremity with intermittent  claudication ([place]) 01/20/2021  ? Hyperlipidemia 01/20/2021  ? GERD (gastroesophageal reflux disease) 01/20/2021  ? Cellulitis 09/23/2020  ? Diabetic foot infection ([place]) 09/23/2020  ? Open toe wound 12/20/2016  ? Lower limb ulcer, calf, right, limited to breakdown of skin ([place]) 11/22/2016  ? PAD (peripheral artery disease) ([place]) 10/20/2016  ? Diabetes ([place]) 09/13/2016  ? Essential hypertension, benign 09/13/2016  ? Morbid obesity ([place]) 09/13/2016  ? Atherosclerosis of native arteries of the extremities with ulceration ([place]) 09/13/2016  ? Tobacco use disorder 09/13/2016  ? Carotid artery obstruction 01/29/2015  ? ? ?REFERRING DIAG: Z89.511 (ICD-10-CM) - Hx of amputation below knee, right ([place]) ? ?THERAPY DIAG: Difficulty in walking, not elsewhere classified ? ?Muscle weakness (generalized) ? ?PERTINENT HISTORY: Pt is a 61 y.o. female referred to OP PT for treatment after R BKA on /21 . PMH includes: anemia, anxiety, arthritis, CHF, COPD, CAD, diabetes, fibromyalgia, gastric reflux, hypertension,CAD,OSA,PVD s/p right BKA, with history of recent admission to [facility] 12/4-12/11/21 for left foot cellulitis s due to infected laceration. Pt reports having her R BKA in either 2016 or 2018 and received a prosthesis but has not worn it since then due to some fear. Also endorsing L knee pain she contributes to arthritis. Pt wanting to improve her strength and mobiltiy in hopes of receiving a new prosthetic. Pt relies on powerchair for current mobility with scoot transfers for toileting, to bed, etc. Denies any falls with transfers. Pt reports being able to stand but does not do if often. Pt  has not walked since her amputation. Pt's goal is to improve strength, mobility and be able to walk. ? ?PRECAUTIONS: Fall ? ?SUBJECTIVE: Pt reports that she has been having some back pain recently. No resting pain upon arrival but pain occurs with specific motions. No significant changes since the last therapy session. No specific questions  currently. ? ? ?PAIN:  ?Are you having pain? No ? ? ?TODAY'S TREATMENT:  ? ? ?Ther-ex  ?Supine R SLR hip flexion with manual resistance 2 x 15; ?Supine R hip long axis IR and ER with manual resistance 2 x 15 each; ?Supine R straight leg hip abduction 2 x 15 with manual resistance;  ?Supine R straight leg hip adduction 2 x 15 with manual resistance; ?Hooklying bolster bridges 2 x 10; ?Seated R LAQ with manual resistance from therapist x 15; ?Seated R HS curls with manual resistance from therapist; ?NuStep L0-1 x 8 minutes with cues from therapist to keep spm>50 and therapist monitoring fatigue. Pt requires one rest break halfway through time; ? ? ?Therapeutic Activity ?Updated outcome measures and goals (see goal section); ?FOTO: 39 ?Knee extension range of motion: R: -10 degrees, L: -10 degrees; ? ?Practiced standing transfers wheelchair to/from NuStep with front wheeled walker. Pt requires extensive cues for proper sequencing and CGA/minA+1 from therapist;   ?Practiced standing transfer from wheelchair into car with front wheeled walker. Extensive cues for proper sequencing with walker and therapist providing CGA/minA+1.  ? ? ?PATIENT EDUCATION: ?Education details: Pt educated throughout session about proper posture and technique with exercises. Improved exercise technique, movement at target joints, use of target muscles after min to mod verbal, visual, tactile cues.  ?Person educated: Patient ?Education method: Explanation ?Education comprehension: verbalized understanding and returned demonstration ? ? ?HOME EXERCISE PROGRAM: ?Access Code [email or web address]  ? ? ? ? PT Short Term Goals  ? ?  ? PT SHORT TERM GOAL #1  ? Title Pt will be independent with HEP to improve strength and AROM deficits to optimize standing, transfer and walking tolerance.   ? Baseline 11/16/21: Established   ? Time 4   ? Period Weeks   ? Status Partially met  ? Target Date 01/26/2022    ?  ? PT SHORT TERM GOAL #2  ? Title Pt will improve standing  tolerance to >/= 30 sec with RW/SW for skin integrity, hip stability, and in prep for stand pivot transfers/pre gait activities.   ? Baseline 11/16/21: 10.66 sec with RW, 12/29/21: Pt has been able to stand for >30s over the last few sessions during transfers and ambulation training;   ? Time 4   ? Period Weeks   ? Status Achieved  ? Target Date   ? ?  ?  ? ?  ? ? ? PT Long Term Goals   ? ?  ? PT LONG TERM GOAL #1  ? Title Pt will improve FOTO to target score of 43 to display clinically significant improvement in functional mobility.   ? Baseline 11/16/21: 34, 12/29/21: 39  ? Time 8   ? Period Weeks   ? Status  Partially Met  ? Target Date 02/23/2022   ?  ? PT LONG TERM GOAL #2  ? Title Pt will improve R and L knee extension by >/= 10 degrees to assist in quad/hip stability with standing transfers.   ? Baseline 11/16/21: Lacking 20 deg on RLE and 10 deg on LLE. 12/29/21: Lacking 10 degrees bilateral knees  ? Time 8   ? Period  Weeks   ? Status Achieved  ? Target Date   ?  ? PT LONG TERM GOAL #3  ? Title Pt will perform stand pivot transfers with LRAD to/from powerchair with CGA to improve tolerance for upright mobility and functional strengthening.   ? Baseline 11/16/21: Performing scoot transfers from powerchair to other surfaces, 12/29/21: CGA/minA+1 for stand pivot transfers    ? Time 8   ? Period Weeks   ? Status Partially met  ? Target Date 02/23/2022   ?  ? PT LONG TERM GOAL #4  ? Title Pt will be able to perform hop to gait with RW 10' to demo safe, short household navigation distances.   ? Baseline 11/16/21: Standing at [place] only, 12/29/21: Able to ambulate short distances (<10') with RW and RLE prosthesis with CGA/minA+1 in parallel bars  ? Time 8   ? Period Weeks   ? Status Partially met  ? Target Date 02/23/2022   ? ?  ?  ? ?  ? ? ? Plan   ? ? Clinical Impression Statement Updated outcome measures/goals with patient during visit today. Her knee extension range of motion has improved to -10 degrees. Her FOTO score improved  from 34 at initial evaluation to 39 today. She has been able to perform ambulation in the parallel bars with her prosthetic limited distances. She has also been able to use a rolling walker inside the parallel bars

## 2022-02-10 ENCOUNTER — Ambulatory Visit: Payer: Medicaid Other | Attending: Primary Care

## 2022-02-10 DIAGNOSIS — M6281 Muscle weakness (generalized): Secondary | ICD-10-CM | POA: Insufficient documentation

## 2022-02-10 DIAGNOSIS — R262 Difficulty in walking, not elsewhere classified: Secondary | ICD-10-CM | POA: Insufficient documentation

## 2022-02-14 NOTE — Therapy (Signed)
OUTPATIENT PHYSICAL THERAPY TREATMENT NOTE/PROGRESS NOTE  Dates of reporting period  11/16/21   to   01/10/22    Patient Name: [name] MRN: [id number] DOB:March 08, 1961, 61 y.o., female Today's Date: 02/15/2022  PCP: Freddy Finner, NP REFERRING PROVIDER: Freddy Finner, NP   PT End of Session - 02/15/22 [phone]     Visit Number 10    Number of Visits 33    Date for PT Re-Evaluation 02/23/22    PT Start Time 7989    PT Stop Time 1100    PT Time Calculation (min) 45 min    Equipment Utilized During Treatment Gait belt    Activity Tolerance Patient limited by fatigue    Behavior During Therapy WFL for tasks assessed/performed               Past Medical History:  Diagnosis Date   Anemia    Anxiety    Arthritis    CHF (congestive heart failure) (H[facility])    COPD (chronic obstructive pulmonary disease) ([place])    Coronary artery disease    Diabetes mellitus without complication ([place])    Fibromyalgia    GERD (gastroesophageal reflux disease)    Hypertension    Peripheral vascular disease ([place])    Sleep apnea    Stroke [facility])    Past Surgical History:  Procedure Laterality Date   CESAREAN SECTION     CORONARY ANGIOPLASTY WITH STENT PLACEMENT Left    DILATION AND CURETTAGE OF UTERUS     ENDARTERECTOMY Left 01/29/2015   Procedure: ENDARTERECTOMY CAROTID;  Surgeon: Algernon Huxley, MD;  Location: [facility] ORS;  Service: Vascular;  Laterality: Left;   LOWER EXTREMITY ANGIOGRAPHY Left 09/24/2020   Procedure: Lower Extremity Angiography;  Surgeon: Algernon Huxley, MD;  Location: [place] [facility];  Service: Cardiovascular;  Laterality: Left;   PERIPHERAL VASCULAR CATHETERIZATION Right 05/27/2015   Procedure: Lower Extremity Angiography;  Surgeon: Algernon Huxley, MD;  Location: [place] [facility];  Service: Cardiovascular;  Laterality: Right;   PERIPHERAL VASCULAR CATHETERIZATION  05/27/2015   Procedure: Lower Extremity Intervention;  Surgeon: Algernon Huxley, MD;  Location: [place] [facility];   Service: Cardiovascular;;   PERIPHERAL VASCULAR CATHETERIZATION Right 09/22/2016   Procedure: Lower Extremity Angiography;  Surgeon: Algernon Huxley, MD;  Location: [place] [facility];  Service: Cardiovascular;  Laterality: Right;   TUBAL LIGATION     Patient Active Problem List   Diagnosis Date Noted   Atherosclerosis of native arteries of extremity with intermittent claudication ([place]) 01/20/2021   Hyperlipidemia 01/20/2021   GERD (gastroesophageal reflux disease) 01/20/2021   Cellulitis 09/23/2020   Diabetic foot infection ([place]) 09/23/2020   Open toe wound 12/20/2016   Lower limb ulcer, calf, right, limited to breakdown of skin ([place]) 11/22/2016   PAD (peripheral artery disease) ([place]) 10/20/2016   Diabetes ([place]) 09/13/2016   Essential hypertension, benign 09/13/2016   Morbid obesity ([place]) 09/13/2016   Atherosclerosis of native arteries of the extremities with ulceration ([place]) 09/13/2016   Tobacco use disorder 09/13/2016   Carotid artery obstruction 01/29/2015    REFERRING DIAG: Z89.511 (ICD-10-CM) - Hx of amputation below knee, right (HCC)  THERAPY DIAG: Difficulty in walking, not elsewhere classified  Muscle weakness (generalized)  PERTINENT HISTORY: Pt is a 61 y.o. female referred to OP PT for treatment after R BKA on /21 . PMH includes: anemia, anxiety, arthritis, CHF, COPD, CAD, diabetes, fibromyalgia, gastric reflux, hypertension,CAD,OSA,PVD s/p right BKA, with history of recent admission to [facility] 12/4-12/11/21 for  left foot cellulitis s due to infected laceration. Pt reports having her R BKA in either 2016 or 2018 and received a prosthesis but has not worn it since then due to some fear. Also endorsing L knee pain she contributes to arthritis. Pt wanting to improve her strength and mobiltiy in hopes of receiving a new prosthetic. Pt relies on powerchair for current mobility with scoot transfers for toileting, to bed, etc. Denies any falls with transfers. Pt reports being able to  stand but does not do if often. Pt has not walked since her amputation. Pt's goal is to improve strength, mobility and be able to walk.  PRECAUTIONS: Fall  SUBJECTIVE: Pt reports that she has been having some back pain recently. No resting pain upon arrival but pain occurs with specific motions. No significant changes since the last therapy session. No specific questions currently.   PAIN:  Are you having pain? No   TODAY'S TREATMENT:    Ther-ex  Attempted transfer to NuStep. Pt is able to stand but too weak to transfer and has uncontrolled descent back into wheelchair;  Updated outcome measures and goals (see goal section); Supine SLR hip flexion with prosthetic 2 x 15 BLE; Supine R straight leg hip abduction 2 x 15 with manual resistance;  Supine R straight leg hip adduction 2 x 15 with manual resistance; Hooklying bolster bridges 2 x 10; Bolster R SAQ with manual resistance 2 x 10; Seated R LAQ with manual resistance from therapist x 15; Seated R HS curls with manual resistance from therapist x 15; Repeated sit to stand from elevated mat table x 3 with attempts at extended standing. Pt unable to stand for a prolonged period of time due to L knee pain and pain in anterior distal residual RLE;   Not performed today: Supine R hip long axis IR and ER with manual resistance 2 x 15 each;   PATIENT EDUCATION: Education details: Pt educated throughout session about proper posture and technique with exercises. Improved exercise technique, movement at target joints, use of target muscles after min to mod verbal, visual, tactile cues, continue HEP, plan of care.  Person educated: Patient Education method: Explanation Education comprehension: verbalized understanding and returned demonstration   HOME EXERCISE PROGRAM: Access Code [email or web address]      PT Short Term Goals      PT SHORT TERM GOAL #1   Title Pt will be independent with HEP to improve strength and AROM deficits to optimize  standing, transfer and walking tolerance.    Baseline 11/16/21: Established    Time 4    Period Weeks    Status Partially met   Target Date 01/26/2022       PT SHORT TERM GOAL #2   Title Pt will improve standing tolerance to >/= 30 sec with RW/SW for skin integrity, hip stability, and in prep for stand pivot transfers/pre gait activities.    Baseline 11/16/21: 10.66 sec with RW, 12/29/21: Pt has been able to stand for >30s over the last few sessions during transfers and ambulation training;    Time 4    Period Weeks    Status Achieved   Target Date              PT Long Term Goals       PT LONG TERM GOAL #1   Title Pt will improve FOTO to target score of 43 to display clinically significant improvement in functional mobility.    Baseline 11/16/21: 34, 12/29/21: 39; 02/15/22:  43   Time 8    Period Weeks    Status  ACHIEVED   Target Date      PT LONG TERM GOAL #2   Title Pt will improve R and L knee extension by >/= 10 degrees to assist in quad/hip stability with standing transfers.    Baseline 11/16/21: Lacking 20 deg on RLE and 10 deg on LLE. 12/29/21: Lacking 10 degrees bilateral knees; 02/15/22: Lacking 10 degrees bilateral knees   Time 8    Period Weeks    Status ACHIEVED   Target Date      PT LONG TERM GOAL #3   Title Pt will perform stand pivot transfers with LRAD to/from powerchair with CGA to improve tolerance for upright mobility and functional strengthening.    Baseline 11/16/21: Performing scoot transfers from powerchair to other surfaces, 12/29/21: CGA/minA+1 for stand pivot transfers; 02/15/22: Regression to min/modA+1 for pivot transfer    Time 8    Period Weeks    Status Partially met   Target Date 02/23/2022      PT LONG TERM GOAL #4   Title Pt will be able to perform hop to gait with RW 10' to demo safe, short household navigation distances.    Baseline 11/16/21: Standing at [place] only, 12/29/21: Able to ambulate short distances (<10') with RW and RLE prosthesis with  CGA/minA+1 in parallel bars; 02/15/22: Regression, pt now unable to ambulate   Time 8    Period Weeks    Status Partially met   Target Date 02/23/2022              Plan     Clinical Impression Statement Updated outcome measures/goals with patient during visit today. Her R knee extension range of motion has stayed at -10 degrees. Her FOTO score improved from 34 at initial evaluation to 43 today. She has previously been able to perform ambulation in the parallel bars with her prosthetic for limited distances however after her prolonged absence from therapy she has become weaker and was unable to attempt ambulation today. Previously she has also been able to use a rolling walker inside the parallel bars for short distance ambulation as well but is unable to attempt today. Attempted stand pivot transfers with patient during visit today however she is unable to perform due to weakness. Given decline focused on mat table strengthening today. Pt is able to perform repeated sit to stands from elevated mat table however standing is limited due to L knee and R distal limb pain. She will need a recertification at next visit. Pt encouraged to continue HEP and follow-up as scheduled. Pt will benefit from PT services to address deficits in strength, balance, and mobility in order to improve function at home.    Personal Factors and Comorbidities Age;Comorbidity 3+;Education;Time since onset of injury/illness/exacerbation;Past/Current Experience    Comorbidities anemia, anxiety, arthritis, CHF, COPD, CAD, diabetes, fibromyalgia, gastric reflux, hypertension,CAD,OSA,PVD    Examination-Activity Limitations Bathing;Hygiene/Grooming;Bed Mobility;Stairs;Optician, dispensing;Toileting;Continence;Dressing    Examination-Participation Restrictions Cleaning;Community Activity;Meal Prep;Driving    Stability/Clinical Decision Making Evolving/Moderate complexity    Rehab Potential Poor    PT Frequency 2x /  week    PT Duration 8 weeks    PT Treatment/Interventions ADLs/Self Care Home Management;DME Instruction;Gait training;Functional mobility training;Therapeutic activities;Therapeutic exercise;Balance training;Neuromuscular re-education;Patient/family education;Prosthetic Training;Orthotic Fit/Training;Wheelchair mobility training;Passive range of motion;Energy conservation    PT Next Visit Plan Recertification, review HEP, practice standing, transfers, and gait    PT Home Exercise Plan Access Code [facility]  Consulted and Agree with Plan of Care Patient;Family member/caregiver    Family Member Consulted Significant other              Phillips Grout PT, DPT, GCS Huprich,Jason, PT 02/15/2022, 2:25 PM

## 2022-02-15 ENCOUNTER — Ambulatory Visit: Payer: Medicaid Other

## 2022-02-15 DIAGNOSIS — M6281 Muscle weakness (generalized): Secondary | ICD-10-CM | POA: Diagnosis present

## 2022-02-15 DIAGNOSIS — R262 Difficulty in walking, not elsewhere classified: Secondary | ICD-10-CM

## 2022-02-17 ENCOUNTER — Ambulatory Visit: Payer: Medicaid Other

## 2022-02-17 DIAGNOSIS — R262 Difficulty in walking, not elsewhere classified: Secondary | ICD-10-CM

## 2022-02-17 DIAGNOSIS — M6281 Muscle weakness (generalized): Secondary | ICD-10-CM

## 2022-02-17 NOTE — Therapy (Signed)
OUTPATIENT PHYSICAL THERAPY TREATMENT NOTE   Patient Name: [name] MRN: [id number] DOB:28-Jun-1961, 61 y.o., female Today's Date: 02/17/2022  PCP: Freddy Finner, NP REFERRING PROVIDER: Freddy Finner, NP   PT End of Session - 02/17/22 1027     Visit Number 11    Number of Visits 33    Date for PT Re-Evaluation 02/23/22    PT Start Time 1022    PT Stop Time 1107    PT Time Calculation (min) 45 min    Equipment Utilized During Treatment Gait belt    Activity Tolerance Patient limited by fatigue    Behavior During Therapy WFL for tasks assessed/performed               Past Medical History:  Diagnosis Date   Anemia    Anxiety    Arthritis    CHF (congestive heart failure) (HCC)    COPD (chronic obstructive pulmonary disease) ([place])    Coronary artery disease    Diabetes mellitus without complication ([place])    Fibromyalgia    GERD (gastroesophageal reflux disease)    Hypertension    Peripheral vascular disease ([place])    Sleep apnea    Stroke [facility])    Past Surgical History:  Procedure Laterality Date   CESAREAN SECTION     CORONARY ANGIOPLASTY WITH STENT PLACEMENT Left    DILATION AND CURETTAGE OF UTERUS     ENDARTERECTOMY Left 01/29/2015   Procedure: ENDARTERECTOMY CAROTID;  Surgeon: Algernon Huxley, MD;  Location: [facility] ORS;  Service: Vascular;  Laterality: Left;   LOWER EXTREMITY ANGIOGRAPHY Left 09/24/2020   Procedure: Lower Extremity Angiography;  Surgeon: Algernon Huxley, MD;  Location: [place] [facility];  Service: Cardiovascular;  Laterality: Left;   PERIPHERAL VASCULAR CATHETERIZATION Right 05/27/2015   Procedure: Lower Extremity Angiography;  Surgeon: Algernon Huxley, MD;  Location: [place] [facility];  Service: Cardiovascular;  Laterality: Right;   PERIPHERAL VASCULAR CATHETERIZATION  05/27/2015   Procedure: Lower Extremity Intervention;  Surgeon: Algernon Huxley, MD;  Location: [place] [facility];  Service: Cardiovascular;;   PERIPHERAL VASCULAR CATHETERIZATION  Right 09/22/2016   Procedure: Lower Extremity Angiography;  Surgeon: Algernon Huxley, MD;  Location: [place] [facility];  Service: Cardiovascular;  Laterality: Right;   TUBAL LIGATION     Patient Active Problem List   Diagnosis Date Noted   Atherosclerosis of native arteries of extremity with intermittent claudication ([place]) 01/20/2021   Hyperlipidemia 01/20/2021   GERD (gastroesophageal reflux disease) 01/20/2021   Cellulitis 09/23/2020   Diabetic foot infection ([place]) 09/23/2020   Open toe wound 12/20/2016   Lower limb ulcer, calf, right, limited to breakdown of skin ([place]) 11/22/2016   PAD (peripheral artery disease) ([place]) 10/20/2016   Diabetes ([place]) 09/13/2016   Essential hypertension, benign 09/13/2016   Morbid obesity ([place]) 09/13/2016   Atherosclerosis of native arteries of the extremities with ulceration ([place]) 09/13/2016   Tobacco use disorder 09/13/2016   Carotid artery obstruction 01/29/2015    REFERRING DIAG: Z89.511 (ICD-10-CM) - Hx of amputation below knee, right (HCC)  THERAPY DIAG: Difficulty in walking, not elsewhere classified  Muscle weakness (generalized)  PERTINENT HISTORY: Pt is a 61 y.o. female referred to OP PT for treatment after R BKA on /21 . PMH includes: anemia, anxiety, arthritis, CHF, COPD, CAD, diabetes, fibromyalgia, gastric reflux, hypertension,CAD,OSA,PVD s/p right BKA, with history of recent admission to [facility] 12/4[facility]12/11/21 for left foot cellulitis s due to infected laceration. Pt reports having her R BKA in  either 2016 or 2018 and received a prosthesis but has not worn it since then due to some fear. Also endorsing L knee pain she contributes to arthritis. Pt wanting to improve her strength and mobiltiy in hopes of receiving a new prosthetic. Pt relies on powerchair for current mobility with scoot transfers for toileting, to bed, etc. Denies any falls with transfers. Pt reports being able to stand but does not do if often. Pt has not walked since her  amputation. Pt's goal is to improve strength, mobility and be able to walk.  PRECAUTIONS: Fall     TODAY'S TREATMENT:    SUBJECTIVE: Pt reports that she has is doing well upon arrival. No resting pain currently. No significant changes since the last therapy session. No specific questions currently.   PAIN:  Are you having pain? No   Ther-ex  Supine SLR hip flexion with manual resistance from therapist 2 x 15 BLE; Supine R straight leg hip abduction 2 x 15 with manual resistance;  Supine R straight leg hip adduction 2 x 15 with manual resistance; Supine R hip long axis IR and ER with manual resistance 2 x 15 each; Hooklying bolster bridges 2 x 15; Bolster R SAQ with manual resistance 2 x 15; Seated R LAQ with manual resistance from therapist 2 x 15; Seated R HS curls with manual resistance from therapist 2 x 15; Repeated sit to stand from elevated mat table x 3 with attempts at extended standing as well as marching however pt remains very limited due to L knee pain and pain in anterior distal residual RLE;    PATIENT EDUCATION: Education details: Pt educated throughout session about proper posture and technique with exercises. Improved exercise technique, movement at target joints, use of target muscles after min to mod verbal, visual, tactile cues, continue HEP; Person educated: Patient Education method: Explanation Education comprehension: verbalized understanding and returned demonstration   HOME EXERCISE PROGRAM: Access Code [email or web address]      PT Short Term Goals      PT SHORT TERM GOAL #1   Title Pt will be independent with HEP to improve strength and AROM deficits to optimize standing, transfer and walking tolerance.    Baseline 11/16/21: Established    Time 4    Period Weeks    Status Partially met   Target Date 01/26/2022       PT SHORT TERM GOAL #2   Title Pt will improve standing tolerance to >/= 30 sec with RW/SW for skin integrity, hip stability, and in prep  for stand pivot transfers/pre gait activities.    Baseline 11/16/21: 10.66 sec with RW, 12/29/21: Pt has been able to stand for >30s over the last few sessions during transfers and ambulation training;    Time 4    Period Weeks    Status Achieved   Target Date              PT Long Term Goals       PT LONG TERM GOAL #1   Title Pt will improve FOTO to target score of 43 to display clinically significant improvement in functional mobility.    Baseline 11/16/21: 34, 12/29/21: 39; 02/15/22: 43   Time 8    Period Weeks    Status  ACHIEVED   Target Date      PT LONG TERM GOAL #2   Title Pt will improve R and L knee extension by >/= 10 degrees to assist in quad/hip stability with standing transfers.  Baseline 11/16/21: Lacking 20 deg on RLE and 10 deg on LLE. 12/29/21: Lacking 10 degrees bilateral knees; 02/15/22: Lacking 10 degrees bilateral knees   Time 8    Period Weeks    Status ACHIEVED   Target Date      PT LONG TERM GOAL #3   Title Pt will perform stand pivot transfers with LRAD to/from powerchair with CGA to improve tolerance for upright mobility and functional strengthening.    Baseline 11/16/21: Performing scoot transfers from powerchair to other surfaces, 12/29/21: CGA/minA+1 for stand pivot transfers; 02/15/22: Regression to min/modA+1 for pivot transfer    Time 8    Period Weeks    Status Partially met   Target Date 02/23/2022      PT LONG TERM GOAL #4   Title Pt will be able to perform hop to gait with RW 10' to demo safe, short household navigation distances.    Baseline 11/16/21: Standing at [place] only, 12/29/21: Able to ambulate short distances (<10') with RW and RLE prosthesis with CGA/minA+1 in parallel bars; 02/15/22: Regression, pt now unable to ambulate   Time 8    Period Weeks    Status Partially met   Target Date 02/23/2022              Plan     Clinical Impression Statement Continued strengthening with patient during session today. She is able to complete all mat  table and sitting exercises however requires rest breaks between sets due to fatigue. Pt is able to perform repeated sit to stands from elevated mat table however standing is still limited due to L knee and distal R limb pain. Pt encouraged to continue HEP and follow-up as scheduled. Pt will benefit from PT services to address deficits in strength, balance, and mobility in order to improve function at home.    Personal Factors and Comorbidities Age;Comorbidity 3+;Education;Time since onset of injury/illness/exacerbation;Past/Current Experience    Comorbidities anemia, anxiety, arthritis, CHF, COPD, CAD, diabetes, fibromyalgia, gastric reflux, hypertension,CAD,OSA,PVD    Examination-Activity Limitations Bathing;Hygiene/Grooming;Bed Mobility;Stairs;Optician, dispensing;Toileting;Continence;Dressing    Examination-Participation Restrictions Cleaning;Community Activity;Meal Prep;Driving    Stability/Clinical Decision Making Evolving/Moderate complexity    Rehab Potential Poor    PT Frequency 2x / week    PT Duration 8 weeks    PT Treatment/Interventions ADLs/Self Care Home Management;DME Instruction;Gait training;Functional mobility training;Therapeutic activities;Therapeutic exercise;Balance training;Neuromuscular re-education;Patient/family education;Prosthetic Training;Orthotic Fit/Training;Wheelchair mobility training;Passive range of motion;Energy conservation    PT Next Visit Plan Review HEP, practice standing, transfers, and gait    PT Home Exercise Plan Access Code [email or web address]    Consulted and Agree with Plan of Care Patient;Family member/caregiver    Family Member Consulted Significant other              Phillips Grout PT, DPT, GCS [name], PT 02/17/2022, 11:32 AM

## 2022-02-22 ENCOUNTER — Ambulatory Visit: Payer: Medicaid Other

## 2022-02-22 DIAGNOSIS — M6281 Muscle weakness (generalized): Secondary | ICD-10-CM

## 2022-02-22 DIAGNOSIS — R262 Difficulty in walking, not elsewhere classified: Secondary | ICD-10-CM | POA: Diagnosis not present

## 2022-02-22 NOTE — Therapy (Signed)
OUTPATIENT PHYSICAL THERAPY TREATMENT NOTE   Patient Name: [name] MRN: [id number] DOB:16-Jul-1961, 61 y.o., female Today's Date: 02/22/2022  PCP: Freddy Finner, NP REFERRING PROVIDER: Freddy Finner, NP   PT End of Session - 02/22/22 1023     Visit Number 12    Number of Visits 33    Date for PT Re-Evaluation 02/23/22    PT Start Time 5809    PT Stop Time 1100    PT Time Calculation (min) 45 min    Equipment Utilized During Treatment Gait belt    Activity Tolerance Patient limited by fatigue    Behavior During Therapy WFL for tasks assessed/performed                Past Medical History:  Diagnosis Date   Anemia    Anxiety    Arthritis    CHF (congestive heart failure) (HCC)    COPD (chronic obstructive pulmonary disease) ([place])    Coronary artery disease    Diabetes mellitus without complication ([place])    Fibromyalgia    GERD (gastroesophageal reflux disease)    Hypertension    Peripheral vascular disease ([place])    Sleep apnea    Stroke [facility])    Past Surgical History:  Procedure Laterality Date   CESAREAN SECTION     CORONARY ANGIOPLASTY WITH STENT PLACEMENT Left    DILATION AND CURETTAGE OF UTERUS     ENDARTERECTOMY Left 01/29/2015   Procedure: ENDARTERECTOMY CAROTID;  Surgeon: Algernon Huxley, MD;  Location: [facility] ORS;  Service: Vascular;  Laterality: Left;   LOWER EXTREMITY ANGIOGRAPHY Left 09/24/2020   Procedure: Lower Extremity Angiography;  Surgeon: Algernon Huxley, MD;  Location: [place] [facility];  Service: Cardiovascular;  Laterality: Left;   PERIPHERAL VASCULAR CATHETERIZATION Right 05/27/2015   Procedure: Lower Extremity Angiography;  Surgeon: Algernon Huxley, MD;  Location: [place] [facility];  Service: Cardiovascular;  Laterality: Right;   PERIPHERAL VASCULAR CATHETERIZATION  05/27/2015   Procedure: Lower Extremity Intervention;  Surgeon: Algernon Huxley, MD;  Location: [place] [facility];  Service: Cardiovascular;;   PERIPHERAL VASCULAR CATHETERIZATION  Right 09/22/2016   Procedure: Lower Extremity Angiography;  Surgeon: Algernon Huxley, MD;  Location: [place] [facility];  Service: Cardiovascular;  Laterality: Right;   TUBAL LIGATION     Patient Active Problem List   Diagnosis Date Noted   Atherosclerosis of native arteries of extremity with intermittent claudication ([place]) 01/20/2021   Hyperlipidemia 01/20/2021   GERD (gastroesophageal reflux disease) 01/20/2021   Cellulitis 09/23/2020   Diabetic foot infection ([place]) 09/23/2020   Open toe wound 12/20/2016   Lower limb ulcer, calf, right, limited to breakdown of skin ([place]) 11/22/2016   PAD (peripheral artery disease) ([place]) 10/20/2016   Diabetes ([place]) 09/13/2016   Essential hypertension, benign 09/13/2016   Morbid obesity ([place]) 09/13/2016   Atherosclerosis of native arteries of the extremities with ulceration ([place]) 09/13/2016   Tobacco use disorder 09/13/2016   Carotid artery obstruction 01/29/2015    REFERRING DIAG: Z89.511 (ICD-10-CM) - Hx of amputation below knee, right (HCC)  THERAPY DIAG: Difficulty in walking, not elsewhere classified  Muscle weakness (generalized)  PERTINENT HISTORY: Pt is a 61 y.o. female referred to OP PT for treatment after R BKA on /21 . PMH includes: anemia, anxiety, arthritis, CHF, COPD, CAD, diabetes, fibromyalgia, gastric reflux, hypertension,CAD,OSA,PVD s/p right BKA, with history of recent admission to [facility] 12/4-12/11/21 for left foot cellulitis s due to infected laceration. Pt reports having her R BKA  in either 2016 or 2018 and received a prosthesis but has not worn it since then due to some fear. Also endorsing L knee pain she contributes to arthritis. Pt wanting to improve her strength and mobiltiy in hopes of receiving a new prosthetic. Pt relies on powerchair for current mobility with scoot transfers for toileting, to bed, etc. Denies any falls with transfers. Pt reports being able to stand but does not do if often. Pt has not walked since her  amputation. Pt's goal is to improve strength, mobility and be able to walk.  PRECAUTIONS: Fall    TODAY'S TREATMENT:    SUBJECTIVE: Pt reports that she has is doing well upon arrival. No resting pain currently. No significant changes since the last therapy session. No specific questions currently.   PAIN:  Are you having pain? No   Ther-ex  All exercises performed with RLE prosthesis donned: NuStep L1-2 x 8 minutes BUE/BLE for leg strengthening with therapist monitoring fatigue and adjusting resistance; Supine SLR hip flexion with manual resistance from therapist 2 x 15 BLE (no resistance on LLE); Supine R straight leg hip abduction 2 x 15 with manual resistance;  Supine R straight leg hip adduction 2 x 15 with manual resistance; Supine R hip long axis IR and ER with manual resistance 2 x 15 each; Hooklying bolster bridges x 15; Bolster R SAQ with manual resistance 2 x 15;   Not performed today: Seated R LAQ with manual resistance from therapist 2 x 15; Seated R HS curls with manual resistance from therapist 2 x 15; Repeated sit to stand from elevated mat table x 3 with attempts at extended standing as well as marching however pt remains very limited due to L knee pain and pain in anterior distal residual RLE;    PATIENT EDUCATION: Education details: Pt educated throughout session about proper posture and technique with exercises. Improved exercise technique, movement at target joints, use of target muscles after min to mod verbal, visual, tactile cues, continue HEP; Person educated: Patient Education method: Explanation Education comprehension: verbalized understanding and returned demonstration   HOME EXERCISE PROGRAM: Access Code [email or web address]      PT Short Term Goals      PT SHORT TERM GOAL #1   Title Pt will be independent with HEP to improve strength and AROM deficits to optimize standing, transfer and walking tolerance.    Baseline 11/16/21: Established    Time 4     Period Weeks    Status Partially met   Target Date 01/26/2022       PT SHORT TERM GOAL #2   Title Pt will improve standing tolerance to >/= 30 sec with RW/SW for skin integrity, hip stability, and in prep for stand pivot transfers/pre gait activities.    Baseline 11/16/21: 10.66 sec with RW, 12/29/21: Pt has been able to stand for >30s over the last few sessions during transfers and ambulation training;    Time 4    Period Weeks    Status Achieved   Target Date              PT Long Term Goals       PT LONG TERM GOAL #1   Title Pt will improve FOTO to target score of 43 to display clinically significant improvement in functional mobility.    Baseline 11/16/21: 34, 12/29/21: 39; 02/15/22: 43   Time 8    Period Weeks    Status  ACHIEVED   Target Date  PT LONG TERM GOAL #2   Title Pt will improve R and L knee extension by >/= 10 degrees to assist in quad/hip stability with standing transfers.    Baseline 11/16/21: Lacking 20 deg on RLE and 10 deg on LLE. 12/29/21: Lacking 10 degrees bilateral knees; 02/15/22: Lacking 10 degrees bilateral knees   Time 8    Period Weeks    Status ACHIEVED   Target Date      PT LONG TERM GOAL #3   Title Pt will perform stand pivot transfers with LRAD to/from powerchair with CGA to improve tolerance for upright mobility and functional strengthening.    Baseline 11/16/21: Performing scoot transfers from powerchair to other surfaces, 12/29/21: CGA/minA+1 for stand pivot transfers; 02/15/22: Regression to min/modA+1 for pivot transfer    Time 8    Period Weeks    Status Partially met   Target Date 02/23/2022      PT LONG TERM GOAL #4   Title Pt will be able to perform hop to gait with RW 10' to demo safe, short household navigation distances.    Baseline 11/16/21: Standing at [place] only, 12/29/21: Able to ambulate short distances (<10') with RW and RLE prosthesis with CGA/minA+1 in parallel bars; 02/15/22: Regression, pt now unable to ambulate   Time 8    Period  Weeks    Status Partially met   Target Date 02/23/2022              Plan     Clinical Impression Statement Continued strengthening with patient during session today. She is able to perform a scoot transfer from her wheelchair to the NuStep today so utilized time on the NuStep for leg strengthening before progressing to the mat table for additional exercises. She continues to require rest breaks between sets due to fatigue. Will return to incorporating standing activities at next session. Pt encouraged to continue HEP and follow-up as scheduled. She will benefit from PT services to address deficits in strength, balance, and mobility in order to improve function at home.    Personal Factors and Comorbidities Age;Comorbidity 3+;Education;Time since onset of injury/illness/exacerbation;Past/Current Experience    Comorbidities anemia, anxiety, arthritis, CHF, COPD, CAD, diabetes, fibromyalgia, gastric reflux, hypertension,CAD,OSA,PVD    Examination-Activity Limitations Bathing;Hygiene/Grooming;Bed Mobility;Stairs;Optician, dispensing;Toileting;Continence;Dressing    Examination-Participation Restrictions Cleaning;Community Activity;Meal Prep;Driving    Stability/Clinical Decision Making Evolving/Moderate complexity    Rehab Potential Poor    PT Frequency 2x / week    PT Duration 8 weeks    PT Treatment/Interventions ADLs/Self Care Home Management;DME Instruction;Gait training;Functional mobility training;Therapeutic activities;Therapeutic exercise;Balance training;Neuromuscular re-education;Patient/family education;Prosthetic Training;Orthotic Fit/Training;Wheelchair mobility training;Passive range of motion;Energy conservation    PT Next Visit Plan Review HEP, practice standing, transfers, and gait    PT Home Exercise Plan Access Code [email or web address]    Consulted and Agree with Plan of Care Patient;Family member/caregiver    Family Member Consulted Significant other               Phillips Grout PT, DPT, GCS Danyetta Gillham, PT 02/22/2022, 11:20 AM

## 2022-02-24 ENCOUNTER — Ambulatory Visit: Payer: Medicaid Other | Attending: Primary Care

## 2022-02-24 DIAGNOSIS — R269 Unspecified abnormalities of gait and mobility: Secondary | ICD-10-CM | POA: Diagnosis present

## 2022-02-24 DIAGNOSIS — R262 Difficulty in walking, not elsewhere classified: Secondary | ICD-10-CM | POA: Insufficient documentation

## 2022-02-24 DIAGNOSIS — M6281 Muscle weakness (generalized): Secondary | ICD-10-CM | POA: Insufficient documentation

## 2022-02-24 NOTE — Therapy (Signed)
OUTPATIENT PHYSICAL THERAPY TREATMENT NOTE/RECERTIFICATION   Patient Name: [name] MRN: [id number] DOB:Jul 10, 1961, 61 y.o., female Today's Date: 02/24/2022  PCP: Freddy Finner, NP REFERRING PROVIDER: Freddy Finner, NP   PT End of Session - 02/24/22 1032     Visit Number 13    Number of Visits 11    Date for PT Re-Evaluation 04/21/22    PT Start Time 0277    PT Stop Time 1100    PT Time Calculation (min) 45 min    Equipment Utilized During Treatment Gait belt    Activity Tolerance Patient limited by fatigue    Behavior During Therapy WFL for tasks assessed/performed              Past Medical History:  Diagnosis Date   Anemia    Anxiety    Arthritis    CHF (congestive heart failure) (HCC)    COPD (chronic obstructive pulmonary disease) ([place])    Coronary artery disease    Diabetes mellitus without complication ([place])    Fibromyalgia    GERD (gastroesophageal reflux disease)    Hypertension    Peripheral vascular disease ([place])    Sleep apnea    Stroke [facility])    Past Surgical History:  Procedure Laterality Date   CESAREAN SECTION     CORONARY ANGIOPLASTY WITH STENT PLACEMENT Left    DILATION AND CURETTAGE OF UTERUS     ENDARTERECTOMY Left 01/29/2015   Procedure: ENDARTERECTOMY CAROTID;  Surgeon: Algernon Huxley, MD;  Location: [facility] ORS;  Service: Vascular;  Laterality: Left;   LOWER EXTREMITY ANGIOGRAPHY Left 09/24/2020   Procedure: Lower Extremity Angiography;  Surgeon: Algernon Huxley, MD;  Location: [place] [facility];  Service: Cardiovascular;  Laterality: Left;   PERIPHERAL VASCULAR CATHETERIZATION Right 05/27/2015   Procedure: Lower Extremity Angiography;  Surgeon: Algernon Huxley, MD;  Location: [place] [facility];  Service: Cardiovascular;  Laterality: Right;   PERIPHERAL VASCULAR CATHETERIZATION  05/27/2015   Procedure: Lower Extremity Intervention;  Surgeon: Algernon Huxley, MD;  Location: [place] [facility];  Service: Cardiovascular;;   PERIPHERAL VASCULAR  CATHETERIZATION Right 09/22/2016   Procedure: Lower Extremity Angiography;  Surgeon: Algernon Huxley, MD;  Location: [place] [facility];  Service: Cardiovascular;  Laterality: Right;   TUBAL LIGATION     Patient Active Problem List   Diagnosis Date Noted   Atherosclerosis of native arteries of extremity with intermittent claudication ([place]) 01/20/2021   Hyperlipidemia 01/20/2021   GERD (gastroesophageal reflux disease) 01/20/2021   Cellulitis 09/23/2020   Diabetic foot infection ([place]) 09/23/2020   Open toe wound 12/20/2016   Lower limb ulcer, calf, right, limited to breakdown of skin ([place]) 11/22/2016   PAD (peripheral artery disease) ([place]) 10/20/2016   Diabetes ([place]) 09/13/2016   Essential hypertension, benign 09/13/2016   Morbid obesity ([place]) 09/13/2016   Atherosclerosis of native arteries of the extremities with ulceration ([place]) 09/13/2016   Tobacco use disorder 09/13/2016   Carotid artery obstruction 01/29/2015    REFERRING DIAG: Z89.511 (ICD-10-CM) - Hx of amputation below knee, right (HCC)  THERAPY DIAG: Difficulty in walking, not elsewhere classified  Muscle weakness (generalized)  PERTINENT HISTORY: Pt is a 61 y.o. female referred to OP PT for treatment after R BKA on /21 . PMH includes: anemia, anxiety, arthritis, CHF, COPD, CAD, diabetes, fibromyalgia, gastric reflux, hypertension,CAD,OSA,PVD s/p right BKA, with history of recent admission to [facility] 12/4-12/11/21 for left foot cellulitis s due to infected laceration. Pt reports having her R BKA in either  2016 or 2018 and received a prosthesis but has not worn it since then due to some fear. Also endorsing L knee pain she contributes to arthritis. Pt wanting to improve her strength and mobiltiy in hopes of receiving a new prosthetic. Pt relies on powerchair for current mobility with scoot transfers for toileting, to bed, etc. Denies any falls with transfers. Pt reports being able to stand but does not do if often. Pt has not walked  since her amputation. Pt's goal is to improve strength, mobility and be able to walk.  PRECAUTIONS: Fall    TODAY'S TREATMENT:    SUBJECTIVE: Pt reports that she has is doing well upon arrival. No resting pain currently. She has noticed some soreness in her L quad recently. No significant changes since the last therapy session. No specific questions currently.   PAIN:  Are you having pain? No   Ther-ex  All exercises performed with RLE prosthesis donned: NuStep L1-3 x 5 minutes BUE/BLE for leg strengthening with therapist monitoring fatigue and adjusting resistance; Supine R SLR hip flexion with manual resistance from therapist 2 x 15; Supine R straight leg hip abduction 2 x 15 with manual resistance;  Supine R straight leg hip adduction 2 x 15 with manual resistance; Supine R hip long axis IR and ER with manual resistance 2 x 15 each; Hooklying bolster bridges 2 x 10; Bolster R SAQ with manual resistance 2 x 15; Seated R LAQ with manual resistance from therapist x 8, x 15 (less resistance provided during second set); Seated R HS curls with green tband resistance 2 x 15; Repeated sit to stand from elevated mat table x 3 with attempts at extended standing and side stepping however standing/stepping limited due to L knee pain and pain in anterior distal residual RLE;    PATIENT EDUCATION: Education details: Pt educated throughout session about proper posture and technique with exercises. Improved exercise technique, movement at target joints, use of target muscles after min to mod verbal, visual, tactile cues, continue HEP; Person educated: Patient Education method: Explanation Education comprehension: verbalized understanding and returned demonstration   HOME EXERCISE PROGRAM: Access Code [email or web address]      PT Short Term Goals      PT SHORT TERM GOAL #1   Title Pt will be independent with HEP to improve strength and AROM deficits to optimize standing, transfer and walking  tolerance.    Baseline 11/16/21: Established    Time 4    Period Weeks    Status Partially met   Target Date 03/24/2022      PT SHORT TERM GOAL #2   Title Pt will improve standing tolerance to >/= 30 sec with RW/SW for skin integrity, hip stability, and in prep for stand pivot transfers/pre gait activities.    Baseline 11/16/21: 10.66 sec with RW, 12/29/21: Pt has been able to stand for >30s over the last few sessions during transfers and ambulation training;    Time 4    Period Weeks    Status Achieved   Target Date              PT Long Term Goals       PT LONG TERM GOAL #1   Title Pt will improve FOTO to target score of 43 to display clinically significant improvement in functional mobility.    Baseline 11/16/21: 34, 12/29/21: 39; 02/15/22: 43   Time 8    Period Weeks    Status  ACHIEVED   Target Date  PT LONG TERM GOAL #2   Title Pt will improve R and L knee extension by >/= 10 degrees to assist in quad/hip stability with standing transfers.    Baseline 11/16/21: Lacking 20 deg on RLE and 10 deg on LLE. 12/29/21: Lacking 10 degrees bilateral knees; 02/15/22: Lacking 10 degrees bilateral knees   Time 8    Period Weeks    Status ACHIEVED   Target Date      PT LONG TERM GOAL #3   Title Pt will perform stand pivot transfers with LRAD to/from powerchair with CGA to improve tolerance for upright mobility and functional strengthening.    Baseline 11/16/21: Performing scoot transfers from powerchair to other surfaces, 12/29/21: CGA/minA+1 for stand pivot transfers; 02/15/22: Regression to min/modA+1 for pivot transfer    Time 8    Period Weeks    Status Partially met   Target Date 04/21/2022      PT LONG TERM GOAL #4   Title Pt will be able to perform hop to gait with RW 10' to demo safe, short household navigation distances.    Baseline 11/16/21: Standing at [place] only, 12/29/21: Able to ambulate short distances (<10') with RW and RLE prosthesis with CGA/minA+1 in parallel bars; 02/15/22:  Regression, pt now unable to ambulate   Time 8    Period Weeks    Status Partially met   Target Date 04/21/2022              Plan     Clinical Impression Statement Updated outcome measures/goals with patient during 02/15/22 visit so no need to update again today. At that time her R knee extension range of motion remained at -10 degrees. Her FOTO score improved from 34 at initial evaluation to 43. She has previously been able to perform ambulation in the parallel bars with her prosthetic for limited distances however after her prolonged absence from therapy she has become weaker and is still unable attempt ambulation today due to weakness and pain. Previously she has also been able to use a rolling walker inside the parallel bars for short distance ambulation as well but is unable to attempt today. Given decline focused on mat table strengthening during session today. Pt is able to perform repeated sit to stands from elevated mat table however standing is limited due to L knee and R distal limb pain. Pt encouraged to continue HEP and follow-up as scheduled. Pt will benefit from PT services to address deficits in strength, balance, and mobility in order to improve function at home.    Personal Factors and Comorbidities Age;Comorbidity 3+;Education;Time since onset of injury/illness/exacerbation;Past/Current Experience    Comorbidities anemia, anxiety, arthritis, CHF, COPD, CAD, diabetes, fibromyalgia, gastric reflux, hypertension,CAD,OSA,PVD    Examination-Activity Limitations Bathing;Hygiene/Grooming;Bed Mobility;Stairs;Optician, dispensing;Toileting;Continence;Dressing    Examination-Participation Restrictions Cleaning;Community Activity;Meal Prep;Driving    Stability/Clinical Decision Making Evolving/Moderate complexity    Rehab Potential Poor    PT Frequency 2x / week    PT Duration 8 weeks    PT Treatment/Interventions ADLs/Self Care Home Management;DME Instruction;Gait  training;Functional mobility training;Therapeutic activities;Therapeutic exercise;Balance training;Neuromuscular re-education;Patient/family education;Prosthetic Training;Orthotic Fit/Training;Wheelchair mobility training;Passive range of motion;Energy conservation    PT Next Visit Plan Review HEP, practice standing, transfers, and gait    PT Home Exercise Plan Access Code [email or web address]    Consulted and Agree with Plan of Care Patient;Family member/caregiver    Family Member Consulted Significant other              Lyndel Safe Huprich PT, DPT, GCS  Arno Cullers, PT 02/24/2022, 11:19 AM

## 2022-03-03 ENCOUNTER — Ambulatory Visit: Payer: Medicaid Other

## 2022-03-03 DIAGNOSIS — R269 Unspecified abnormalities of gait and mobility: Secondary | ICD-10-CM

## 2022-03-03 DIAGNOSIS — R262 Difficulty in walking, not elsewhere classified: Secondary | ICD-10-CM | POA: Diagnosis not present

## 2022-03-03 DIAGNOSIS — M6281 Muscle weakness (generalized): Secondary | ICD-10-CM

## 2022-03-03 NOTE — Therapy (Signed)
OUTPATIENT PHYSICAL THERAPY TREATMENT NOTE   Patient Name: [name] MRN: [id number] DOB:03-02-1961, 61 y.o., female Today's Date: 03/03/2022  PCP: Freddy Finner, NP REFERRING PROVIDER: Freddy Finner, NP   PT End of Session - 03/03/22 1220     Visit Number 14    Number of Visits 61    Date for PT Re-Evaluation 04/21/22    PT Start Time 1150    PT Stop Time 1230    PT Time Calculation (min) 40 min    Equipment Utilized During Treatment Gait belt    Activity Tolerance Patient limited by fatigue    Behavior During Therapy WFL for tasks assessed/performed              Past Medical History:  Diagnosis Date   Anemia    Anxiety    Arthritis    CHF (congestive heart failure) (HCC)    COPD (chronic obstructive pulmonary disease) ([place])    Coronary artery disease    Diabetes mellitus without complication (HCC)    Fibromyalgia    GERD (gastroesophageal reflux disease)    Hypertension    Peripheral vascular disease ([place])    Sleep apnea    Stroke [facility])    Past Surgical History:  Procedure Laterality Date   CESAREAN SECTION     CORONARY ANGIOPLASTY WITH STENT PLACEMENT Left    DILATION AND CURETTAGE OF UTERUS     ENDARTERECTOMY Left 01/29/2015   Procedure: ENDARTERECTOMY CAROTID;  Surgeon: Algernon Huxley, MD;  Location: [facility] ORS;  Service: Vascular;  Laterality: Left;   LOWER EXTREMITY ANGIOGRAPHY Left 09/24/2020   Procedure: Lower Extremity Angiography;  Surgeon: Algernon Huxley, MD;  Location: [place] [facility];  Service: Cardiovascular;  Laterality: Left;   PERIPHERAL VASCULAR CATHETERIZATION Right 05/27/2015   Procedure: Lower Extremity Angiography;  Surgeon: Algernon Huxley, MD;  Location: [place] [facility];  Service: Cardiovascular;  Laterality: Right;   PERIPHERAL VASCULAR CATHETERIZATION  05/27/2015   Procedure: Lower Extremity Intervention;  Surgeon: Algernon Huxley, MD;  Location: [place] [facility];  Service: Cardiovascular;;   PERIPHERAL VASCULAR CATHETERIZATION Right  09/22/2016   Procedure: Lower Extremity Angiography;  Surgeon: Algernon Huxley, MD;  Location: [place] [facility];  Service: Cardiovascular;  Laterality: Right;   TUBAL LIGATION     Patient Active Problem List   Diagnosis Date Noted   Atherosclerosis of native arteries of extremity with intermittent claudication ([place]) 01/20/2021   Hyperlipidemia 01/20/2021   GERD (gastroesophageal reflux disease) 01/20/2021   Cellulitis 09/23/2020   Diabetic foot infection ([place]) 09/23/2020   Open toe wound 12/20/2016   Lower limb ulcer, calf, right, limited to breakdown of skin ([place]) 11/22/2016   PAD (peripheral artery disease) ([place]) 10/20/2016   Diabetes ([place]) 09/13/2016   Essential hypertension, benign 09/13/2016   Morbid obesity ([place]) 09/13/2016   Atherosclerosis of native arteries of the extremities with ulceration ([place]) 09/13/2016   Tobacco use disorder 09/13/2016   Carotid artery obstruction 01/29/2015    REFERRING DIAG: Z89.511 (ICD-10-CM) - Hx of amputation below knee, right (HCC)  THERAPY DIAG: Difficulty in walking, not elsewhere classified  Muscle weakness (generalized)  Abnormality of gait and mobility  PERTINENT HISTORY: Pt is a 61 y.o. female referred to OP PT for treatment after R BKA on /21 . PMH includes: anemia, anxiety, arthritis, CHF, COPD, CAD, diabetes, fibromyalgia, gastric reflux, hypertension,CAD,OSA,PVD s/p right BKA, with history of recent admission to [facility] 12/4-12/11/21 for left foot cellulitis s due to infected laceration. Pt reports  having her R BKA in either 2016 or 2018 and received a prosthesis but has not worn it since then due to some fear. Also endorsing L knee pain she contributes to arthritis. Pt wanting to improve her strength and mobiltiy in hopes of receiving a new prosthetic. Pt relies on powerchair for current mobility with scoot transfers for toileting, to bed, etc. Denies any falls with transfers. Pt reports being able to stand but does not do if often. Pt has  not walked since her amputation. Pt's goal is to improve strength, mobility and be able to walk.  PRECAUTIONS: Fall    TODAY'S TREATMENT:    SUBJECTIVE: Pt reports that she has is doing well upon arrival. No resting pain currently. She was very sore after the last therapy session. She continues to experience L knee pain. No significant changes since the last therapy session. No specific questions currently.   PAIN:  Are you having pain? No   Ther-ex  All exercises performed with RLE prosthesis donned: Supine R SLR hip flexion with manual resistance from therapist 2 x 20; Supine R straight leg hip abduction 2 x 20 with manual resistance;  Supine R straight leg hip adduction 2 x 20 with manual resistance; Supine R hip long axis IR and ER with manual resistance 2 x 20 each; Hooklying bolster bridges 2 x 20; Bolster SAQ with manual resistance 2 x 20 BLE; Seated R LAQ with manual resistance from therapist x 20; Seated R HS curls with green tband resistance x 20;   Not performed: Repeated sit to stand from elevated mat table x 3 with attempts at extended standing and side stepping however standing/stepping limited due to L knee pain and pain in anterior distal residual RLE;   PATIENT EDUCATION: Education details: Pt educated throughout session about proper posture and technique with exercises. Improved exercise technique, movement at target joints, use of target muscles after min to mod verbal, visual, tactile cues, continue HEP; Person educated: Patient Education method: Explanation Education comprehension: verbalized understanding and returned demonstration   HOME EXERCISE PROGRAM: Access Code [email or web address]      PT Short Term Goals      PT SHORT TERM GOAL #1   Title Pt will be independent with HEP to improve strength and AROM deficits to optimize standing, transfer and walking tolerance.    Baseline 11/16/21: Established    Time 4    Period Weeks    Status Partially met    Target Date 03/24/2022      PT SHORT TERM GOAL #2   Title Pt will improve standing tolerance to >/= 30 sec with RW/SW for skin integrity, hip stability, and in prep for stand pivot transfers/pre gait activities.    Baseline 11/16/21: 10.66 sec with RW, 12/29/21: Pt has been able to stand for >30s over the last few sessions during transfers and ambulation training;    Time 4    Period Weeks    Status Achieved   Target Date              PT Long Term Goals       PT LONG TERM GOAL #1   Title Pt will improve FOTO to target score of 43 to display clinically significant improvement in functional mobility.    Baseline 11/16/21: 34, 12/29/21: 39; 02/15/22: 43   Time 8    Period Weeks    Status  ACHIEVED   Target Date      PT LONG TERM GOAL #2  Title Pt will improve R and L knee extension by >/= 10 degrees to assist in quad/hip stability with standing transfers.    Baseline 11/16/21: Lacking 20 deg on RLE and 10 deg on LLE. 12/29/21: Lacking 10 degrees bilateral knees; 02/15/22: Lacking 10 degrees bilateral knees   Time 8    Period Weeks    Status ACHIEVED   Target Date      PT LONG TERM GOAL #3   Title Pt will perform stand pivot transfers with LRAD to/from powerchair with CGA to improve tolerance for upright mobility and functional strengthening.    Baseline 11/16/21: Performing scoot transfers from powerchair to other surfaces, 12/29/21: CGA/minA+1 for stand pivot transfers; 02/15/22: Regression to min/modA+1 for pivot transfer    Time 8    Period Weeks    Status Partially met   Target Date 04/21/2022      PT LONG TERM GOAL #4   Title Pt will be able to perform hop to gait with RW 10' to demo safe, short household navigation distances.    Baseline 11/16/21: Standing at [place] only, 12/29/21: Able to ambulate short distances (<10') with RW and RLE prosthesis with CGA/minA+1 in parallel bars; 02/15/22: Regression, pt now unable to ambulate   Time 8    Period Weeks    Status Partially met   Target  Date 04/21/2022              Plan     Clinical Impression Statement Pt demonstrates good effort/motivation during exercises today. Session focused on mat table strengthening as pt has been experience continued L knee pain with attempts to stand during prior sessions. Pt encouraged to continue HEP and follow-up as scheduled. Pt will benefit from PT services to address deficits in strength, balance, and mobility in order to improve function at home.    Personal Factors and Comorbidities Age;Comorbidity 3+;Education;Time since onset of injury/illness/exacerbation;Past/Current Experience    Comorbidities anemia, anxiety, arthritis, CHF, COPD, CAD, diabetes, fibromyalgia, gastric reflux, hypertension,CAD,OSA,PVD    Examination-Activity Limitations Bathing;Hygiene/Grooming;Bed Mobility;Stairs;Optician, dispensing;Toileting;Continence;Dressing    Examination-Participation Restrictions Cleaning;Community Activity;Meal Prep;Driving    Stability/Clinical Decision Making Evolving/Moderate complexity    Rehab Potential Poor    PT Frequency 2x / week    PT Duration 8 weeks    PT Treatment/Interventions ADLs/Self Care Home Management;DME Instruction;Gait training;Functional mobility training;Therapeutic activities;Therapeutic exercise;Balance training;Neuromuscular re-education;Patient/family education;Prosthetic Training;Orthotic Fit/Training;Wheelchair mobility training;Passive range of motion;Energy conservation    PT Next Visit Plan Review HEP, practice standing, transfers, and gait as pt is able   PT Home Exercise Plan Access Code [email or web address]    Consulted and Agree with Plan of Care Patient;Family member/caregiver    Family Member Consulted Significant other              Phillips Grout PT, DPT, GCS Teresa Lemmerman, PT 03/03/2022, 2:44 PM

## 2022-03-08 ENCOUNTER — Ambulatory Visit: Payer: Medicaid Other | Admitting: Physical Therapy

## 2022-03-08 DIAGNOSIS — R262 Difficulty in walking, not elsewhere classified: Secondary | ICD-10-CM | POA: Diagnosis not present

## 2022-03-08 DIAGNOSIS — M6281 Muscle weakness (generalized): Secondary | ICD-10-CM

## 2022-03-08 DIAGNOSIS — R269 Unspecified abnormalities of gait and mobility: Secondary | ICD-10-CM

## 2022-03-08 NOTE — Therapy (Signed)
OUTPATIENT PHYSICAL THERAPY TREATMENT NOTE   Patient Name: [name] MRN: [id number] DOB:1960/12/07, 61 y.o., female Today's Date: 03/08/2022  PCP: Freddy Finner, NP REFERRING PROVIDER: Freddy Finner, NP   PT End of Session - 03/08/22 1305     Visit Number 15    Number of Visits 42    Date for PT Re-Evaluation 04/21/22    PT Start Time 2683    PT Stop Time 1059    PT Time Calculation (min) 44 min    Equipment Utilized During Treatment Gait belt    Activity Tolerance Patient limited by fatigue    Behavior During Therapy WFL for tasks assessed/performed               Past Medical History:  Diagnosis Date   Anemia    Anxiety    Arthritis    CHF (congestive heart failure) (HCC)    COPD (chronic obstructive pulmonary disease) ([place])    Coronary artery disease    Diabetes mellitus without complication ([place])    Fibromyalgia    GERD (gastroesophageal reflux disease)    Hypertension    Peripheral vascular disease ([place])    Sleep apnea    Stroke [facility])    Past Surgical History:  Procedure Laterality Date   CESAREAN SECTION     CORONARY ANGIOPLASTY WITH STENT PLACEMENT Left    DILATION AND CURETTAGE OF UTERUS     ENDARTERECTOMY Left 01/29/2015   Procedure: ENDARTERECTOMY CAROTID;  Surgeon: Algernon Huxley, MD;  Location: [facility] ORS;  Service: Vascular;  Laterality: Left;   LOWER EXTREMITY ANGIOGRAPHY Left 09/24/2020   Procedure: Lower Extremity Angiography;  Surgeon: Algernon Huxley, MD;  Location: [place] [facility];  Service: Cardiovascular;  Laterality: Left;   PERIPHERAL VASCULAR CATHETERIZATION Right 05/27/2015   Procedure: Lower Extremity Angiography;  Surgeon: Algernon Huxley, MD;  Location: [place] [facility];  Service: Cardiovascular;  Laterality: Right;   PERIPHERAL VASCULAR CATHETERIZATION  05/27/2015   Procedure: Lower Extremity Intervention;  Surgeon: Algernon Huxley, MD;  Location: [place] [facility];  Service: Cardiovascular;;   PERIPHERAL VASCULAR CATHETERIZATION  Right 09/22/2016   Procedure: Lower Extremity Angiography;  Surgeon: Algernon Huxley, MD;  Location: [place] [facility];  Service: Cardiovascular;  Laterality: Right;   TUBAL LIGATION     Patient Active Problem List   Diagnosis Date Noted   Atherosclerosis of native arteries of extremity with intermittent claudication ([place]) 01/20/2021   Hyperlipidemia 01/20/2021   GERD (gastroesophageal reflux disease) 01/20/2021   Cellulitis 09/23/2020   Diabetic foot infection ([place]) 09/23/2020   Open toe wound 12/20/2016   Lower limb ulcer, calf, right, limited to breakdown of skin ([place]) 11/22/2016   PAD (peripheral artery disease) ([place]) 10/20/2016   Diabetes ([place]) 09/13/2016   Essential hypertension, benign 09/13/2016   Morbid obesity ([place]) 09/13/2016   Atherosclerosis of native arteries of the extremities with ulceration ([place]) 09/13/2016   Tobacco use disorder 09/13/2016   Carotid artery obstruction 01/29/2015    REFERRING DIAG: Z89.511 (ICD-10-CM) - Hx of amputation below knee, right (HCC)  THERAPY DIAG: Difficulty in walking, not elsewhere classified  Muscle weakness (generalized)  Abnormality of gait and mobility  PERTINENT HISTORY: Pt is a 61 y.o. female referred to OP PT for treatment after R BKA on /21 . PMH includes: anemia, anxiety, arthritis, CHF, COPD, CAD, diabetes, fibromyalgia, gastric reflux, hypertension,CAD,OSA,PVD s/p right BKA, with history of recent admission to [facility] 12/4[facility]12/11/21 for left foot cellulitis s due to infected laceration. Pt  reports having her R BKA in either 2016 or 2018 and received a prosthesis but has not worn it since then due to some fear. Also endorsing L knee pain she contributes to arthritis. Pt wanting to improve her strength and mobiltiy in hopes of receiving a new prosthetic. Pt relies on powerchair for current mobility with scoot transfers for toileting, to bed, etc. Denies any falls with transfers. Pt reports being able to stand but does not do if often.  Pt has not walked since her amputation. Pt's goal is to improve strength, mobility and be able to walk.  PRECAUTIONS: Fall    TODAY'S TREATMENT:    SUBJECTIVE: Pt reports that she has is doing well today. Left lower leg is bleeding upon arrival; pt states she hit her scab with the car door while coming to therapy. Denies pain in RLE; reports mild pain in LLE where she hit her leg with car door. States she is always sore following therapy sessions. No specific questions currently. States she is not compliant with HEP.    PAIN:  Are you having pain? No    Ther-ex   PT assisted with donning prosthesis; performed STS for improved suction/fit.   All exercises performed with RLE prosthesis donned: Supine R SLR hip flexion with manual resistance from therapist 2 x 20; Supine R straight leg hip abduction 2 x 20 with manual resistance;  Supine R straight leg hip adduction 2 x 20 with manual resistance; Supine R hip long axis IR and ER with manual resistance 2 x 20 each; Hooklying bolster bridges 2 x 20; Bolster SAQ with manual resistance 2 x 20 BLE; Seated R LAQ with manual resistance from therapist x 20; Seated R HS curls with green tband resistance x 20;    Not performed: Repeated sit to stand from elevated mat table x 3 with attempts at extended standing and side stepping however standing/stepping limited due to L knee pain and pain in anterior distal residual RLE; Seated R HS curls with green tband resistance x 20;    PATIENT EDUCATION: Education details: Pt educated throughout session about proper posture and technique with exercises. Improved exercise technique, movement at target joints, use of target muscles after min to mod verbal, visual, tactile cues, continue HEP; Person educated: Patient Education method: Explanation Education comprehension: verbalized understanding and returned demonstration   HOME EXERCISE PROGRAM: Access Code [email or web address]      PT Short Term Goals       PT SHORT TERM GOAL #1   Title Pt will be independent with HEP to improve strength and AROM deficits to optimize standing, transfer and walking tolerance.    Baseline 11/16/21: Established    Time 4    Period Weeks    Status Partially met   Target Date 03/24/2022      PT SHORT TERM GOAL #2   Title Pt will improve standing tolerance to >/= 30 sec with RW/SW for skin integrity, hip stability, and in prep for stand pivot transfers/pre gait activities.    Baseline 11/16/21: 10.66 sec with RW, 12/29/21: Pt has been able to stand for >30s over the last few sessions during transfers and ambulation training;    Time 4    Period Weeks    Status Achieved   Target Date              PT Long Term Goals       PT LONG TERM GOAL #1   Title Pt will improve FOTO to  target score of 43 to display clinically significant improvement in functional mobility.    Baseline 11/16/21: 34, 12/29/21: 39; 02/15/22: 43   Time 8    Period Weeks    Status  ACHIEVED   Target Date      PT LONG TERM GOAL #2   Title Pt will improve R and L knee extension by >/= 10 degrees to assist in quad/hip stability with standing transfers.    Baseline 11/16/21: Lacking 20 deg on RLE and 10 deg on LLE. 12/29/21: Lacking 10 degrees bilateral knees; 02/15/22: Lacking 10 degrees bilateral knees   Time 8    Period Weeks    Status ACHIEVED   Target Date      PT LONG TERM GOAL #3   Title Pt will perform stand pivot transfers with LRAD to/from powerchair with CGA to improve tolerance for upright mobility and functional strengthening.    Baseline 11/16/21: Performing scoot transfers from powerchair to other surfaces, 12/29/21: CGA/minA+1 for stand pivot transfers; 02/15/22: Regression to min/modA+1 for pivot transfer    Time 8    Period Weeks    Status Partially met   Target Date 04/21/2022      PT LONG TERM GOAL #4   Title Pt will be able to perform hop to gait with RW 10' to demo safe, short household navigation distances.     Baseline 11/16/21: Standing at [place] only, 12/29/21: Able to ambulate short distances (<10') with RW and RLE prosthesis with CGA/minA+1 in parallel bars; 02/15/22: Regression, pt now unable to ambulate   Time 8    Period Weeks    Status Partially met   Target Date 04/21/2022              Plan     Clinical Impression Statement Pt demonstrates good effort/motivation during exercises today. She is good-natured and talkative, often requiring redirection to task. She arrived with left lower leg bleeding due to hitting leg on the car door; PT offered and applied band-aid. PT also assisted with donning prosthesis this morning. Continued LE strengthening on mat table. Pt verbalized wanting to use NuStep; will hold on this activity until pt primary therapist is present for pt safety during transfer onto machine. Pt encouraged to continue HEP and follow-up as scheduled. Pt will benefit from PT services to address deficits in strength, balance, and mobility in order to improve function at home.     Personal Factors and Comorbidities Age;Comorbidity 3+;Education;Time since onset of injury/illness/exacerbation;Past/Current Experience    Comorbidities anemia, anxiety, arthritis, CHF, COPD, CAD, diabetes, fibromyalgia, gastric reflux, hypertension,CAD,OSA,PVD    Examination-Activity Limitations Bathing;Hygiene/Grooming;Bed Mobility;Stairs;Optician, dispensing;Toileting;Continence;Dressing    Examination-Participation Restrictions Cleaning;Community Activity;Meal Prep;Driving    Stability/Clinical Decision Making Evolving/Moderate complexity    Rehab Potential Poor    PT Frequency 2x / week    PT Duration 8 weeks    PT Treatment/Interventions ADLs/Self Care Home Management;DME Instruction;Gait training;Functional mobility training;Therapeutic activities;Therapeutic exercise;Balance training;Neuromuscular re-education;Patient/family education;Prosthetic Training;Orthotic Fit/Training;Wheelchair  mobility training;Passive range of motion;Energy conservation    PT Next Visit Plan Review HEP, practice standing, transfers, and gait as pt is able   PT Home Exercise Plan Access Code [email or web address]    Consulted and Agree with Plan of Care Patient;Family member/caregiver    Family Member Consulted Significant other              Patrina Levering PT, DPT

## 2022-03-10 ENCOUNTER — Ambulatory Visit: Payer: Medicaid Other | Admitting: Physical Therapy

## 2022-03-10 DIAGNOSIS — R262 Difficulty in walking, not elsewhere classified: Secondary | ICD-10-CM | POA: Diagnosis not present

## 2022-03-10 DIAGNOSIS — M6281 Muscle weakness (generalized): Secondary | ICD-10-CM

## 2022-03-10 DIAGNOSIS — R269 Unspecified abnormalities of gait and mobility: Secondary | ICD-10-CM

## 2022-03-10 NOTE — Therapy (Signed)
OUTPATIENT PHYSICAL THERAPY TREATMENT NOTE   Patient Name: [name] MRN: [id number] DOB:1961-03-09, 61 y.o., female Today's Date: 03/10/2022  PCP: Freddy Finner, NP REFERRING PROVIDER: Freddy Finner, NP   PT End of Session - 03/10/22 1207     Visit Number 16    Number of Visits 53    Date for PT Re-Evaluation 04/21/22    PT Start Time 1018    PT Stop Time 1100    PT Time Calculation (min) 42 min    Equipment Utilized During Treatment Gait belt    Activity Tolerance Patient limited by fatigue    Behavior During Therapy WFL for tasks assessed/performed                Past Medical History:  Diagnosis Date   Anemia    Anxiety    Arthritis    CHF (congestive heart failure) (HCC)    COPD (chronic obstructive pulmonary disease) ([place])    Coronary artery disease    Diabetes mellitus without complication (HCC)    Fibromyalgia    GERD (gastroesophageal reflux disease)    Hypertension    Peripheral vascular disease ([place])    Sleep apnea    Stroke [facility])    Past Surgical History:  Procedure Laterality Date   CESAREAN SECTION     CORONARY ANGIOPLASTY WITH STENT PLACEMENT Left    DILATION AND CURETTAGE OF UTERUS     ENDARTERECTOMY Left 01/29/2015   Procedure: ENDARTERECTOMY CAROTID;  Surgeon: Algernon Huxley, MD;  Location: [facility] ORS;  Service: Vascular;  Laterality: Left;   LOWER EXTREMITY ANGIOGRAPHY Left 09/24/2020   Procedure: Lower Extremity Angiography;  Surgeon: Algernon Huxley, MD;  Location: [place] [facility];  Service: Cardiovascular;  Laterality: Left;   PERIPHERAL VASCULAR CATHETERIZATION Right 05/27/2015   Procedure: Lower Extremity Angiography;  Surgeon: Algernon Huxley, MD;  Location: [place] [facility];  Service: Cardiovascular;  Laterality: Right;   PERIPHERAL VASCULAR CATHETERIZATION  05/27/2015   Procedure: Lower Extremity Intervention;  Surgeon: Algernon Huxley, MD;  Location: [place] [facility];  Service: Cardiovascular;;   PERIPHERAL VASCULAR CATHETERIZATION  Right 09/22/2016   Procedure: Lower Extremity Angiography;  Surgeon: Algernon Huxley, MD;  Location: [place] [facility];  Service: Cardiovascular;  Laterality: Right;   TUBAL LIGATION     Patient Active Problem List   Diagnosis Date Noted   Atherosclerosis of native arteries of extremity with intermittent claudication ([place]) 01/20/2021   Hyperlipidemia 01/20/2021   GERD (gastroesophageal reflux disease) 01/20/2021   Cellulitis 09/23/2020   Diabetic foot infection ([place]) 09/23/2020   Open toe wound 12/20/2016   Lower limb ulcer, calf, right, limited to breakdown of skin ([place]) 11/22/2016   PAD (peripheral artery disease) ([place]) 10/20/2016   Diabetes ([place]) 09/13/2016   Essential hypertension, benign 09/13/2016   Morbid obesity ([place]) 09/13/2016   Atherosclerosis of native arteries of the extremities with ulceration ([place]) 09/13/2016   Tobacco use disorder 09/13/2016   Carotid artery obstruction 01/29/2015    REFERRING DIAG: Z89.511 (ICD-10-CM) - Hx of amputation below knee, right (HCC)  THERAPY DIAG: Difficulty in walking, not elsewhere classified  Muscle weakness (generalized)  Abnormality of gait and mobility  PERTINENT HISTORY: Pt is a 61 y.o. female referred to OP PT for treatment after R BKA on /21 . PMH includes: anemia, anxiety, arthritis, CHF, COPD, CAD, diabetes, fibromyalgia, gastric reflux, hypertension,CAD,OSA,PVD s/p right BKA, with history of recent admission to [facility] 12/4-12/11/21 for left foot cellulitis s due to infected laceration.  Pt reports having her R BKA in either 2016 or 2018 and received a prosthesis but has not worn it since then due to some fear. Also endorsing L knee pain she contributes to arthritis. Pt wanting to improve her strength and mobiltiy in hopes of receiving a new prosthetic. Pt relies on powerchair for current mobility with scoot transfers for toileting, to bed, etc. Denies any falls with transfers. Pt reports being able to stand but does not do if often.  Pt has not walked since her amputation. Pt's goal is to improve strength, mobility and be able to walk.  PRECAUTIONS: Fall    TODAY'S TREATMENT:    SUBJECTIVE: Pt reports that she didn't want to get out of bed this morning, she was sleeping so well. Denies pain in RLE. Does mention left shoulder has been bothering her. No specific questions currently. States she is not compliant with HEP.    PAIN:  Are you having pain? No    Ther-ex   PT assisted with donning prosthesis; performed STS using RW for improved suction/fit.   All exercises performed with RLE prosthesis donned: Supine R SLR hip flexion with manual resistance from therapist 2 x 20; Supine R straight leg hip abduction 2 x 20 with manual resistance;  Supine R straight leg hip adduction 2 x 20 with manual resistance; Supine R hip long axis IR and ER with manual resistance 2 x 20 each; Hooklying bolster bridges 2 x 20; Bolster SAQ with manual resistance 2 x 20 BLE; Seated R LAQ with manual resistance from therapist 2 x 20; Seated R HS curls with green tband resistance 2 x 20;    Not performed: Repeated sit to stand from elevated mat table x 3 with attempts at extended standing and side stepping however standing/stepping limited due to L knee pain and pain in anterior distal residual RLE;    PATIENT EDUCATION: Education details: Pt educated throughout session about proper posture and technique with exercises. Improved exercise technique, movement at target joints, use of target muscles after min to mod verbal, visual, tactile cues, continue HEP; Person educated: Patient Education method: Explanation Education comprehension: verbalized understanding and returned demonstration   HOME EXERCISE PROGRAM: Access Code [email or web address]      PT Short Term Goals      PT SHORT TERM GOAL #1   Title Pt will be independent with HEP to improve strength and AROM deficits to optimize standing, transfer and walking tolerance.     Baseline 11/16/21: Established    Time 4    Period Weeks    Status Partially met   Target Date 03/24/2022      PT SHORT TERM GOAL #2   Title Pt will improve standing tolerance to >/= 30 sec with RW/SW for skin integrity, hip stability, and in prep for stand pivot transfers/pre gait activities.    Baseline 11/16/21: 10.66 sec with RW, 12/29/21: Pt has been able to stand for >30s over the last few sessions during transfers and ambulation training;    Time 4    Period Weeks    Status Achieved   Target Date              PT Long Term Goals       PT LONG TERM GOAL #1   Title Pt will improve FOTO to target score of 43 to display clinically significant improvement in functional mobility.    Baseline 11/16/21: 34, 12/29/21: 39; 02/15/22: 43   Time 8    Period  Weeks    Status  ACHIEVED   Target Date      PT LONG TERM GOAL #2   Title Pt will improve R and L knee extension by >/= 10 degrees to assist in quad/hip stability with standing transfers.    Baseline 11/16/21: Lacking 20 deg on RLE and 10 deg on LLE. 12/29/21: Lacking 10 degrees bilateral knees; 02/15/22: Lacking 10 degrees bilateral knees   Time 8    Period Weeks    Status ACHIEVED   Target Date      PT LONG TERM GOAL #3   Title Pt will perform stand pivot transfers with LRAD to/from powerchair with CGA to improve tolerance for upright mobility and functional strengthening.    Baseline 11/16/21: Performing scoot transfers from powerchair to other surfaces, 12/29/21: CGA/minA+1 for stand pivot transfers; 02/15/22: Regression to min/modA+1 for pivot transfer    Time 8    Period Weeks    Status Partially met   Target Date 04/21/2022      PT LONG TERM GOAL #4   Title Pt will be able to perform hop to gait with RW 10' to demo safe, short household navigation distances.    Baseline 11/16/21: Standing at [place] only, 12/29/21: Able to ambulate short distances (<10') with RW and RLE prosthesis with CGA/minA+1 in parallel bars; 02/15/22: Regression, pt  now unable to ambulate   Time 8    Period Weeks    Status Partially met   Target Date 04/21/2022              Plan     Clinical Impression Statement Pt demonstrates good effort/motivation during exercises today. PT assisted with donning prosthesis upon arrival. Continued LE strengthening on mat table. Slightly increased manual resistance provided with PT encouraging for greater pt effort. Occasional redirecting to task. Pt encouraged to continue HEP and follow-up as scheduled. Pt will benefit from PT services to address deficits in strength, balance, and mobility in order to improve function at home.     Personal Factors and Comorbidities Age;Comorbidity 3+;Education;Time since onset of injury/illness/exacerbation;Past/Current Experience    Comorbidities anemia, anxiety, arthritis, CHF, COPD, CAD, diabetes, fibromyalgia, gastric reflux, hypertension,CAD,OSA,PVD    Examination-Activity Limitations Bathing;Hygiene/Grooming;Bed Mobility;Stairs;Optician, dispensing;Toileting;Continence;Dressing    Examination-Participation Restrictions Cleaning;Community Activity;Meal Prep;Driving    Stability/Clinical Decision Making Evolving/Moderate complexity    Rehab Potential Poor    PT Frequency 2x / week    PT Duration 8 weeks    PT Treatment/Interventions ADLs/Self Care Home Management;DME Instruction;Gait training;Functional mobility training;Therapeutic activities;Therapeutic exercise;Balance training;Neuromuscular re-education;Patient/family education;Prosthetic Training;Orthotic Fit/Training;Wheelchair mobility training;Passive range of motion;Energy conservation    PT Next Visit Plan Review HEP, practice standing, transfers, and gait as pt is able   PT Home Exercise Plan Access Code [email or web address]    Consulted and Agree with Plan of Care Patient;Family member/caregiver    Family Member Consulted Significant other              Patrina Levering PT, DPT

## 2022-03-15 DIAGNOSIS — M6281 Muscle weakness (generalized): Secondary | ICD-10-CM

## 2022-03-15 DIAGNOSIS — R262 Difficulty in walking, not elsewhere classified: Secondary | ICD-10-CM

## 2022-03-24 ENCOUNTER — Ambulatory Visit: Payer: Medicaid Other

## 2022-03-24 DIAGNOSIS — R262 Difficulty in walking, not elsewhere classified: Secondary | ICD-10-CM | POA: Diagnosis not present

## 2022-03-24 DIAGNOSIS — M6281 Muscle weakness (generalized): Secondary | ICD-10-CM

## 2022-03-24 NOTE — Therapy (Signed)
OUTPATIENT PHYSICAL THERAPY TREATMENT NOTE   Patient Name: [name] MRN: [id number] DOB:02-Sep-1961, 61 y.o., female Today's Date: 03/24/2022  PCP: Cynthia Finner, NP REFERRING PROVIDER: Freddy Finner, NP   PT End of Session - 03/24/22 1158     Visit Number 17    Number of Visits 53    Date for PT Re-Evaluation 04/21/22    PT Start Time 1150    PT Stop Time 1230    PT Time Calculation (min) 40 min    Equipment Utilized During Treatment Gait belt    Activity Tolerance Patient limited by fatigue    Behavior During Therapy WFL for tasks assessed/performed               Past Medical History:  Diagnosis Date   Anemia    Anxiety    Arthritis    CHF (congestive heart failure) (HCC)    COPD (chronic obstructive pulmonary disease) ([place])    Coronary artery disease    Diabetes mellitus without complication (HCC)    Fibromyalgia    GERD (gastroesophageal reflux disease)    Hypertension    Peripheral vascular disease ([place])    Sleep apnea    Stroke [facility])    Past Surgical History:  Procedure Laterality Date   CESAREAN SECTION     CORONARY ANGIOPLASTY WITH STENT PLACEMENT Left    DILATION AND CURETTAGE OF UTERUS     ENDARTERECTOMY Left 01/29/2015   Procedure: ENDARTERECTOMY CAROTID;  Surgeon: Cynthia Huxley, MD;  Location: [facility] ORS;  Service: Vascular;  Laterality: Left;   LOWER EXTREMITY ANGIOGRAPHY Left 09/24/2020   Procedure: Lower Extremity Angiography;  Surgeon: Cynthia Huxley, MD;  Location: [place] [facility];  Service: Cardiovascular;  Laterality: Left;   PERIPHERAL VASCULAR CATHETERIZATION Right 05/27/2015   Procedure: Lower Extremity Angiography;  Surgeon: Cynthia Huxley, MD;  Location: [place] [facility];  Service: Cardiovascular;  Laterality: Right;   PERIPHERAL VASCULAR CATHETERIZATION  05/27/2015   Procedure: Lower Extremity Intervention;  Surgeon: Cynthia Huxley, MD;  Location: [place] [facility];  Service: Cardiovascular;;   PERIPHERAL VASCULAR CATHETERIZATION  Right 09/22/2016   Procedure: Lower Extremity Angiography;  Surgeon: Cynthia Huxley, MD;  Location: [place] [facility];  Service: Cardiovascular;  Laterality: Right;   TUBAL LIGATION     Patient Active Problem List   Diagnosis Date Noted   Atherosclerosis of native arteries of extremity with intermittent claudication ([place]) 01/20/2021   Hyperlipidemia 01/20/2021   GERD (gastroesophageal reflux disease) 01/20/2021   Cellulitis 09/23/2020   Diabetic foot infection ([place]) 09/23/2020   Open toe wound 12/20/2016   Lower limb ulcer, calf, right, limited to breakdown of skin ([place]) 11/22/2016   PAD (peripheral artery disease) ([place]) 10/20/2016   Diabetes ([place]) 09/13/2016   Essential hypertension, benign 09/13/2016   Morbid obesity ([place]) 09/13/2016   Atherosclerosis of native arteries of the extremities with ulceration ([place]) 09/13/2016   Tobacco use disorder 09/13/2016   Carotid artery obstruction 01/29/2015    REFERRING DIAG: Z89.511 (ICD-10-CM) - Hx of amputation below knee, right (HCC)  THERAPY DIAG: Muscle weakness (generalized)  Difficulty in walking, not elsewhere classified  PERTINENT HISTORY: Pt is a 60 y.o. female referred to OP PT for treatment after R BKA on /21 . PMH includes: anemia, anxiety, arthritis, CHF, COPD, CAD, diabetes, fibromyalgia, gastric reflux, hypertension,CAD,OSA,PVD s/p right BKA, with history of recent admission to [facility] 12/4-12/11/21 for left foot cellulitis s due to infected laceration. Pt reports having her R BKA in  either 2016 or 2018 and received a prosthesis but has not worn it since then due to some fear. Also endorsing L knee pain she contributes to arthritis. Pt wanting to improve her strength and mobiltiy in hopes of receiving a new prosthetic. Pt relies on powerchair for current mobility with scoot transfers for toileting, to bed, etc. Denies any falls with transfers. Pt reports being able to stand but does not do if often. Pt has not walked since her  amputation. Pt's goal is to improve strength, mobility and be able to walk.  PRECAUTIONS: Fall    TODAY'S TREATMENT:    SUBJECTIVE: Pt reports that she has is doing well upon arrival. No resting pain currently. She has not been consistent with her HEP. She reports a slight improvement in her L knee pain since the last therapy session. She still has not contacted orthopedics. No significant changes since the last therapy session. No specific questions currently.   PAIN:  Are you having pain? No   Ther-ex  All exercises performed with RLE prosthesis doffed: Supine R SLR hip flexion with manual resistance from therapist 2 x 20; Supine R straight leg hip abduction 2 x 20 with manual resistance;  Supine R straight leg hip adduction 2 x 20 with manual resistance; Supine R hip long axis IR and ER with manual resistance 2 x 20 each; Hooklying bolster bridges 2 x 20; Bolster SAQ with manual resistance 2 x 20 BLE; Hooklying clams with manual resistance 2 x 20 BLE; Hooklying adductor squeeze with manual resistance 2 x 20 BLE;   Therapeutic Activity Sit to stand from elevated mat table working on standing endurance with BUE support on front wheeled walker x 3. During second and third set performed limited marching inside walker however limited secondary to L knee pain and pain in anterior distal residual RLE;   Not performed: Seated R LAQ with manual resistance from therapist x 20; Seated R HS curls with green tband resistance x 20;    PATIENT EDUCATION: Education details: Pt educated throughout session about proper posture and technique with exercises. Improved exercise technique, movement at target joints, use of target muscles after min to mod verbal, visual, tactile cues, continue HEP; Person educated: Patient Education method: Explanation Education comprehension: verbalized understanding and returned demonstration   HOME EXERCISE PROGRAM: Access Code [email or web address]      PT Short  Term Goals      PT SHORT TERM GOAL #1   Title Pt will be independent with HEP to improve strength and AROM deficits to optimize standing, transfer and walking tolerance.    Baseline 11/16/21: Established    Time 4    Period Weeks    Status Partially met   Target Date 03/24/2022      PT SHORT TERM GOAL #2   Title Pt will improve standing tolerance to >/= 30 sec with RW/SW for skin integrity, hip stability, and in prep for stand pivot transfers/pre gait activities.    Baseline 11/16/21: 10.66 sec with RW, 12/29/21: Pt has been able to stand for >30s over the last few sessions during transfers and ambulation training;    Time 4    Period Weeks    Status Achieved   Target Date              PT Long Term Goals       PT LONG TERM GOAL #1   Title Pt will improve FOTO to target score of 43 to display clinically significant improvement  in functional mobility.    Baseline 11/16/21: 34, 12/29/21: 39; 02/15/22: 43   Time 8    Period Weeks    Status  ACHIEVED   Target Date      PT LONG TERM GOAL #2   Title Pt will improve R and L knee extension by >/= 10 degrees to assist in quad/hip stability with standing transfers.    Baseline 11/16/21: Lacking 20 deg on RLE and 10 deg on LLE. 12/29/21: Lacking 10 degrees bilateral knees; 02/15/22: Lacking 10 degrees bilateral knees   Time 8    Period Weeks    Status ACHIEVED   Target Date      PT LONG TERM GOAL #3   Title Pt will perform stand pivot transfers with LRAD to/from powerchair with CGA to improve tolerance for upright mobility and functional strengthening.    Baseline 11/16/21: Performing scoot transfers from powerchair to other surfaces, 12/29/21: CGA/minA+1 for stand pivot transfers; 02/15/22: Regression to min/modA+1 for pivot transfer    Time 8    Period Weeks    Status Partially met   Target Date 04/21/2022      PT LONG TERM GOAL #4   Title Pt will be able to perform hop to gait with RW 10' to demo safe, short household navigation distances.     Baseline 11/16/21: Standing at [place] only, 12/29/21: Able to ambulate short distances (<10') with RW and RLE prosthesis with CGA/minA+1 in parallel bars; 02/15/22: Regression, pt now unable to ambulate   Time 8    Period Weeks    Status Partially met   Target Date 04/21/2022              Plan     Clinical Impression Statement Pt demonstrates good effort/motivation during exercises today. Session mostly focused on mat table strengthening however pt is once again able to perform some limited standing inside of a walker as well as very limited marching. However she remains limited as she continues to experience continued L knee and residual RLE pain. Pt encouraged to continue HEP and follow-up as scheduled. She will benefit from PT services to address deficits in strength, balance, and mobility in order to improve function at home.    Personal Factors and Comorbidities Age;Comorbidity 3+;Education;Time since onset of injury/illness/exacerbation;Past/Current Experience    Comorbidities anemia, anxiety, arthritis, CHF, COPD, CAD, diabetes, fibromyalgia, gastric reflux, hypertension,CAD,OSA,PVD    Examination-Activity Limitations Bathing;Hygiene/Grooming;Bed Mobility;Stairs;Optician, dispensing;Toileting;Continence;Dressing    Examination-Participation Restrictions Cleaning;Community Activity;Meal Prep;Driving    Stability/Clinical Decision Making Evolving/Moderate complexity    Rehab Potential Poor    PT Frequency 2x / week    PT Duration 8 weeks    PT Treatment/Interventions ADLs/Self Care Home Management;DME Instruction;Gait training;Functional mobility training;Therapeutic activities;Therapeutic exercise;Balance training;Neuromuscular re-education;Patient/family education;Prosthetic Training;Orthotic Fit/Training;Wheelchair mobility training;Passive range of motion;Energy conservation    PT Next Visit Plan Review HEP, practice standing, transfers, and gait as pt is able   PT Home  Exercise Plan Access Code [email or web address]    Consulted and Agree with Plan of Care Patient;Family member/caregiver    Family Member Consulted Significant other              Phillips Grout PT, DPT, GCS Helon Wisinski, PT 03/24/2022, 3:10 PM

## 2022-04-05 ENCOUNTER — Ambulatory Visit: Payer: Medicaid Other

## 2022-04-05 DIAGNOSIS — M6281 Muscle weakness (generalized): Secondary | ICD-10-CM

## 2022-04-05 DIAGNOSIS — R262 Difficulty in walking, not elsewhere classified: Secondary | ICD-10-CM

## 2022-04-12 DIAGNOSIS — M6281 Muscle weakness (generalized): Secondary | ICD-10-CM

## 2022-04-12 DIAGNOSIS — R262 Difficulty in walking, not elsewhere classified: Secondary | ICD-10-CM

## 2022-05-03 ENCOUNTER — Ambulatory Visit: Payer: Medicaid Other | Attending: Primary Care

## 2022-05-03 DIAGNOSIS — R262 Difficulty in walking, not elsewhere classified: Secondary | ICD-10-CM | POA: Insufficient documentation

## 2022-05-03 DIAGNOSIS — M6281 Muscle weakness (generalized): Secondary | ICD-10-CM | POA: Insufficient documentation

## 2022-06-24 ENCOUNTER — Other Ambulatory Visit: Payer: Self-pay

## 2022-06-24 ENCOUNTER — Telehealth: Payer: Self-pay

## 2022-06-24 DIAGNOSIS — Z1211 Encounter for screening for malignant neoplasm of colon: Secondary | ICD-10-CM

## 2022-06-24 MED ORDER — NA SULFATE-K SULFATE-MG SULF 17.5-3.13-1.6 GM/177ML PO SOLN: 1.0000 | Freq: Once | ORAL | 0 refills | Status: AC

## 2022-06-24 NOTE — Telephone Encounter (Signed)
Gastroenterology Pre-Procedure Review  Request Date: 08/02/22 Requesting Physician: Dr. Allen Norris  PATIENT REVIEW QUESTIONS: The patient responded to the following health history questions as indicated:    1. Are you having any GI issues?  Upset stomach if she eats something that does not agree with her 2. Do you have a personal history of Polyps? no 3. Do you have a family history of Colon Cancer or Polyps? no 4. Diabetes Mellitus? yes (type 2 advised to hold Metformin 2 days prior to colonoscopy, take half of usual dose of insulin.  She does not take Trulicity anymore) 5. Joint replacements in the past 12 months?no 6. Major health problems in the past 3 months?no 7. Any artificial heart valves, MVP, or defibrillator?no    MEDICATIONS & ALLERGIES:    Patient reports the following regarding taking any anticoagulation/antiplatelet therapy:   Plavix, Coumadin, Eliquis, Xarelto, Lovenox, Pradaxa, Brilinta, or Effient? yes (Plavix prescribed by Freddy Finner clearance sent to office) Aspirin? no  Patient confirms/reports the following medications:  Current Outpatient Medications  Medication Sig Dispense Refill   atorvastatin (LIPITOR) 40 MG tablet Take 40 mg by mouth daily.     buPROPion (WELLBUTRIN XL) 300 MG 24 hr tablet Take 300 mg by mouth daily. Pt taking 300 mg by mouth daily     cetirizine (ZYRTEC) 10 MG tablet Take 10 mg by mouth daily.     clopidogrel (PLAVIX) 75 MG tablet Take 75 mg by mouth daily.     collagenase (SANTYL) ointment Apply topically daily. 15 g 0   Dulaglutide (TRULICITY) 7.78 EU/2.3NT SOPN Inject 0.75 mg into the skin every Tuesday.     DULoxetine (CYMBALTA) 60 MG capsule Take 60 mg by mouth daily.     enalapril (VASOTEC) 5 MG tablet Take 5 mg by mouth daily.     gabapentin (NEURONTIN) 300 MG capsule Take 300-600 mg by mouth at bedtime.     HUMULIN R U-500 KWIKPEN 500 UNIT/ML kwikpen Inject 50-70 Units into the skin See admin instructions. Inject 70u under the skin  every morning before breakfast and inject 50u under the skin every evening before supper  3   metFORMIN (GLUCOPHAGE-XR) 500 MG 24 hr tablet Take 500 mg by mouth daily with supper.     oxybutynin (DITROPAN-XL) 10 MG 24 hr tablet Take 10 mg by mouth daily.     pantoprazole (PROTONIX) 40 MG tablet Take 40 mg by mouth daily.     traZODone (DESYREL) 50 MG tablet Take 100 mg by mouth at bedtime.     No current facility-administered medications for this visit.    Patient confirms/reports the following allergies:  Allergies  Allergen Reactions   Niacin And Related Nausea And Vomiting    No orders of the defined types were placed in this encounter.   AUTHORIZATION INFORMATION Primary Insurance: 1D#: Group #:  Secondary Insurance: 1D#: Group #:  SCHEDULE INFORMATION: Date:  Time: Location:

## 2022-07-08 ENCOUNTER — Telehealth: Payer: Self-pay

## 2022-07-08 NOTE — Telephone Encounter (Signed)
Patient is scheduled for colonoscopy on 08/02/22.  Blood Thinner request sent to Freddy Finner, NP on 06/24/22.  Advice has not been received.  Request has been refaxed.  Thanks,  [place], [place]

## 2022-07-12 ENCOUNTER — Telehealth: Payer: Self-pay

## 2022-07-12 NOTE — Telephone Encounter (Signed)
Patient contacted office stated that she lost her instructions.  Patient has been advised that I  will reprint her instructions.  Advised her to hold Metformin 2 days prior to colonoscopy, Trulicity 7 days prior to colonoscopy.  Thanks,  [place], [place]

## 2022-07-21 ENCOUNTER — Other Ambulatory Visit: Payer: Self-pay

## 2022-07-21 ENCOUNTER — Telehealth: Payer: Self-pay

## 2022-07-21 MED ORDER — NA SULFATE-K SULFATE-MG SULF 17.5-3.13-1.6 GM/177ML PO SOLN: 1.0000 | Freq: Once | ORAL | 0 refills | Status: AC

## 2022-07-21 NOTE — Telephone Encounter (Signed)
Patient confirmed that she no longer takes Plavix.  Just wanted to confirm this was accurate because she stated that she took on when triage was completed on 06/24/22.  New Suprep rx sent to [facility].  Patient stated she does not use [facility] on [address].  Advised patient if Suprep is not covered by insurance we can change to alternative prep-but she will need to let me know.  Thanks,  [place], [place]

## 2022-08-01 ENCOUNTER — Encounter: Payer: Self-pay | Admitting: Gastroenterology

## 2022-08-02 ENCOUNTER — Encounter
Admission: RE | Disposition: A | Discharge status: Home / Self Care | Payer: Self-pay | Source: Home / Self Care | Attending: Gastroenterology

## 2022-08-02 ENCOUNTER — Ambulatory Visit
Admission: RE | Admit: 2022-08-02 | Discharge: 2022-08-02 | Disposition: A | Discharge status: Home / Self Care | Payer: Medicaid Other | Attending: Gastroenterology | Admitting: Gastroenterology

## 2022-08-02 DIAGNOSIS — Z1211 Encounter for screening for malignant neoplasm of colon: Secondary | ICD-10-CM | POA: Insufficient documentation

## 2022-08-02 DIAGNOSIS — Z538 Procedure and treatment not carried out for other reasons: Secondary | ICD-10-CM | POA: Insufficient documentation

## 2022-08-02 HISTORY — PX: COLONOSCOPY WITH PROPOFOL: SHX5780

## 2022-08-02 SURGERY — COLONOSCOPY WITH PROPOFOL
Anesthesia: General

## 2022-08-02 MED ORDER — SODIUM CHLORIDE 0.9 % IV SOLN: INTRAVENOUS | Status: DC

## 2022-08-02 MED ORDER — PROPOFOL 1000 MG/100ML IV EMUL
INTRAVENOUS | Status: AC
  Filled 2022-08-02: qty 100

## 2022-08-02 NOTE — OR Nursing (Signed)
Reports dark brown results with some solid stool. Dr. Allen Norris notified.Patient was instructed that we can proceed with colonoscopy but will have to abort if prep is too poor. Patient decided to reschedule. Discharged home via ambulation.

## 2022-08-03 ENCOUNTER — Encounter: Payer: Self-pay | Admitting: Gastroenterology

## 2022-08-11 ENCOUNTER — Emergency Department
Admission: EM | Admit: 2022-08-11 | Discharge: 2022-08-12 | Disposition: A | Discharge status: Home / Self Care | Payer: Medicaid Other | Attending: Emergency Medicine | Admitting: Emergency Medicine

## 2022-08-11 ENCOUNTER — Other Ambulatory Visit: Payer: Self-pay

## 2022-08-11 ENCOUNTER — Emergency Department: Payer: Medicaid Other

## 2022-08-11 DIAGNOSIS — M6281 Muscle weakness (generalized): Secondary | ICD-10-CM | POA: Diagnosis not present

## 2022-08-11 DIAGNOSIS — E11649 Type 2 diabetes mellitus with hypoglycemia without coma: Secondary | ICD-10-CM | POA: Insufficient documentation

## 2022-08-11 DIAGNOSIS — R4781 Slurred speech: Secondary | ICD-10-CM | POA: Insufficient documentation

## 2022-08-11 DIAGNOSIS — R29810 Facial weakness: Secondary | ICD-10-CM | POA: Insufficient documentation

## 2022-08-11 DIAGNOSIS — E162 Hypoglycemia, unspecified: Secondary | ICD-10-CM

## 2022-08-11 LAB — CBC WITH DIFFERENTIAL/PLATELET
Abs Immature Granulocytes: 0.04 10*3/uL (ref 0.00–0.07)
Basophils Absolute: 0.1 10*3/uL (ref 0.0–0.1)
Basophils Relative: 1 %
Eosinophils Absolute: 0.3 10*3/uL (ref 0.0–0.5)
Eosinophils Relative: 2 %
HCT: 36.3 % (ref 36.0–46.0)
Hemoglobin: 11.3 g/dL — ABNORMAL LOW (ref 12.0–15.0)
Immature Granulocytes: 0 %
Lymphocytes Relative: 13 %
Lymphs Abs: 1.7 10*3/uL (ref 0.7–4.0)
MCH: 25 pg — ABNORMAL LOW (ref 26.0–34.0)
MCHC: 31.1 g/dL (ref 30.0–36.0)
MCV: 80.3 fL (ref 80.0–100.0)
Monocytes Absolute: 0.8 10*3/uL (ref 0.1–1.0)
Monocytes Relative: 6 %
Neutro Abs: 9.8 10*3/uL — ABNORMAL HIGH (ref 1.7–7.7)
Neutrophils Relative %: 78 %
Platelets: 227 10*3/uL (ref 150–400)
RBC: 4.52 MIL/uL (ref 3.87–5.11)
RDW: 14.9 % (ref 11.5–15.5)
WBC: 12.6 10*3/uL — ABNORMAL HIGH (ref 4.0–10.5)
nRBC: 0 % (ref 0.0–0.2)

## 2022-08-11 LAB — COMPREHENSIVE METABOLIC PANEL
ALT: 19 U/L (ref 0–44)
AST: 17 U/L (ref 15–41)
Albumin: 3.1 g/dL — ABNORMAL LOW (ref 3.5–5.0)
Alkaline Phosphatase: 113 U/L (ref 38–126)
Anion gap: 6 (ref 5–15)
BUN: 14 mg/dL (ref 8–23)
CO2: 26 mmol/L (ref 22–32)
Calcium: 8.6 mg/dL — ABNORMAL LOW (ref 8.9–10.3)
Chloride: 106 mmol/L (ref 98–111)
Creatinine, Ser: 1.14 mg/dL — ABNORMAL HIGH (ref 0.44–1.00)
GFR, Estimated: 54 mL/min — ABNORMAL LOW (ref >60)
Glucose, Bld: 54 mg/dL — ABNORMAL LOW (ref 70–99)
Potassium: 3.3 mmol/L — ABNORMAL LOW (ref 3.5–5.1)
Sodium: 138 mmol/L (ref 135–145)
Total Bilirubin: 0.4 mg/dL (ref 0.3–1.2)
Total Protein: 6.9 g/dL (ref 6.5–8.1)

## 2022-08-11 LAB — TROPONIN I (HIGH SENSITIVITY)
Troponin I (High Sensitivity): 27 ng/L — ABNORMAL HIGH (ref <18)
Troponin I (High Sensitivity): 24 ng/L — ABNORMAL HIGH (ref <18)

## 2022-08-11 LAB — URINALYSIS, ROUTINE W REFLEX MICROSCOPIC
Bilirubin Urine: NEGATIVE
Glucose, UA: NEGATIVE mg/dL
Hgb urine dipstick: NEGATIVE
Ketones, ur: NEGATIVE mg/dL
Leukocytes,Ua: NEGATIVE
Nitrite: NEGATIVE
Protein, ur: >=300 mg/dL — AB
Specific Gravity, Urine: 1.005 (ref 1.005–1.030)
pH: 6 (ref 5.0–8.0)

## 2022-08-11 LAB — PROTIME-INR
INR: 1.1 (ref 0.8–1.2)
Prothrombin Time: 13.6 seconds (ref 11.4–15.2)

## 2022-08-11 LAB — CBG MONITORING, ED
Glucose-Capillary: 55 mg/dL — ABNORMAL LOW (ref 70–99)
Glucose-Capillary: 102 mg/dL — ABNORMAL HIGH (ref 70–99)

## 2022-08-11 MED ORDER — DEXTROSE 50 % IV SOLN
INTRAVENOUS | Status: AC
  Filled 2022-08-11: qty 50

## 2022-08-11 MED ORDER — DEXTROSE 50 % IV SOLN
1.0000 | Freq: Once | INTRAVENOUS | Status: AC
  Administered 2022-08-11: 50 mL via INTRAVENOUS

## 2022-08-11 MED ORDER — LORAZEPAM 2 MG/ML IJ SOLN
0.5000 mg | Freq: Once | INTRAMUSCULAR | Status: AC
  Administered 2022-08-11: 0.5 mg via INTRAVENOUS
  Filled 2022-08-11: qty 1

## 2022-08-11 NOTE — ED Notes (Signed)
Pt given a dinner box and apple juice with ice. Pt's boyfriend is going to help her open food.

## 2022-08-11 NOTE — ED Notes (Signed)
While MD in room, family members and pt said she was having slurred speech again. Pt said it felt like her tongue was to big for her mouth. Pt's family members then said she came right back out of it. Pt did not have facial droop or left arm weakness. Pt's repeat BG was102

## 2022-08-11 NOTE — ED Provider Notes (Signed)
[facility] Provider Note   Event Date/Time   First MD Initiated Contact with Patient 08/11/22 1923     (approximate) History  Transient Ischemic Attack  HPI [name] is a 61 y.o. female with a stated past medical history of type 2 diabetes and right BKA who presents for slurred speech and facial droop as well as left-sided upper extremity weakness that occurred for approximately 5 minutes prior to arrival and then again for approximately 5 minutes with EMS during transport.  Patient was found to have glucose of 50 upon arrival and was given D50 with improvement of symptoms.  Patient has had symptoms similar to this in the past that lasted approximately 5 minutes few months ago and resolved spontaneously after eating. ROS: Patient currently denies any vision changes, tinnitus, difficulty speaking, facial droop, sore throat, chest pain, shortness of breath, abdominal pain, nausea/vomiting/diarrhea, dysuria, or weakness/numbness/paresthesias in any extremity   Physical Exam  Triage Vital Signs: ED Triage Vitals  Enc Vitals Group     BP 08/11/22 1920 138/75     Pulse Rate 08/11/22 1920 85     Resp 08/11/22 1920 20     Temp 08/11/22 1920 98.4 F (36.9 C)     Temp Source 08/11/22 1920 Oral     SpO2 08/11/22 1920 98 %     Weight 08/11/22 1922 256 lb 2.8 oz (116.2 kg)     Height --      Head Circumference --      Peak Flow --      Pain Score 08/11/22 1921 0     Pain Loc --      Pain Edu? --      Excl. in GC? --    Most recent vital signs: Vitals:   08/11/22 1920 08/11/22 2200  BP: 138/75 133/71  Pulse: 85 89  Resp: 20 15  Temp: 98.4 F (36.9 C)   SpO2: 98% 98%   General: Awake, oriented x4. CV:  Good peripheral perfusion.  Resp:  Normal effort.  Abd:  No distention.  Other:  Middle-aged morbidly obese Caucasian female laying in bed in no acute distress with right BKA.  Speech is mildly slurred at this time.  Otherwise NIH stroke scale negative ED  Results / Procedures / Treatments  Labs (all labs ordered are listed, but only abnormal results are displayed) Labs Reviewed  COMPREHENSIVE METABOLIC PANEL - Abnormal; Notable for the following components:      Result Value   Potassium 3.3 (*)    Glucose, Bld 54 (*)    Creatinine, Ser 1.14 (*)    Calcium 8.6 (*)    Albumin 3.1 (*)    GFR, Estimated 54 (*)    All other components within normal limits  CBC WITH DIFFERENTIAL/PLATELET - Abnormal; Notable for the following components:   WBC 12.6 (*)    Hemoglobin 11.3 (*)    MCH 25.0 (*)    Neutro Abs 9.8 (*)    All other components within normal limits  URINALYSIS, ROUTINE W REFLEX MICROSCOPIC - Abnormal; Notable for the following components:   Color, Urine YELLOW (*)    APPearance CLEAR (*)    Protein, ur >=300 (*)    Bacteria, UA RARE (*)    All other components within normal limits  CBG MONITORING, ED - Abnormal; Notable for the following components:   Glucose-Capillary 55 (*)    All other components within normal limits  CBG MONITORING, ED - Abnormal; Notable for  the following components:   Glucose-Capillary 102 (*)    All other components within normal limits  TROPONIN I (HIGH SENSITIVITY) - Abnormal; Notable for the following components:   Troponin I (High Sensitivity) 27 (*)    All other components within normal limits  TROPONIN I (HIGH SENSITIVITY) - Abnormal; Notable for the following components:   Troponin I (High Sensitivity) 24 (*)    All other components within normal limits  PROTIME-INR   EKG ED ECG REPORT I, Naaman Plummer, the attending physician, personally viewed and interpreted this ECG. Date: 08/11/2022 EKG Time: 1917 Rate: 79 Rhythm: normal sinus rhythm QRS Axis: normal Intervals: normal ST/T Wave abnormalities: normal Narrative Interpretation: no evidence of acute ischemia RADIOLOGY ED MD interpretation: CT of the head without contrast interpreted by me shows no evidence of acute abnormalities  including no intracerebral hemorrhage, obvious masses, or significant edema -Agree with radiology assessment Official radiology report(s): MR Brain Wo Contrast (neuro protocol)  Result Date: 08/11/2022 CLINICAL DATA:  Facial droop EXAM: MRI HEAD WITHOUT CONTRAST TECHNIQUE: Multiplanar, multiecho pulse sequences of the brain and surrounding structures were obtained without intravenous contrast. COMPARISON:  None Available. FINDINGS: Brain: No acute infarct, mass effect or extra-axial collection. No acute or chronic hemorrhage. There is confluent hyperintense T2-weighted signal within the white matter. There is advanced atrophy. Old infarcts of the left centrum semiovale and left cerebellum. Vascular: Major flow voids are preserved. Skull and upper cervical spine: Normal calvarium and skull base. Visualized upper cervical spine and soft tissues are normal. Sinuses/Orbits:No paranasal sinus fluid levels or advanced mucosal thickening. No mastoid or middle ear effusion. Normal orbits. IMPRESSION: 1. No acute intracranial abnormality. 2. Advanced atrophy and chronic small vessel ischemic disease. 3. Old infarcts of the left centrum semiovale and left cerebellum. Electronically Signed   By: Ulyses Jarred M.D.   On: 08/11/2022 22:48   CT Head Wo Contrast  Result Date: 08/11/2022 CLINICAL DATA:  Neuro deficit, acute, stroke suspected EXAM: CT HEAD WITHOUT CONTRAST TECHNIQUE: Contiguous axial images were obtained from the base of the skull through the vertex without intravenous contrast. RADIATION DOSE REDUCTION: This exam was performed according to the departmental dose-optimization program which includes automated exposure control, adjustment of the mA and/or kV according to patient size and/or use of iterative reconstruction technique. COMPARISON:  01/24/2014 FINDINGS: Brain: There is atrophy and chronic small vessel disease changes. No acute intracranial abnormality. Specifically, no hemorrhage, hydrocephalus,  mass lesion, acute infarction, or significant intracranial injury. Vascular: No hyperdense vessel or unexpected calcification. Skull: No acute calvarial abnormality. Sinuses/Orbits: Complete opacification of the right frontal sinus and right ethmoid air cells. Other: None IMPRESSION: Atrophy, chronic microvascular disease. No acute intracranial abnormality. Right frontal and ethmoid sinusitis. Favor chronic although new since prior study. Electronically Signed   By: Rolm Baptise M.D.   On: 08/11/2022 19:59   PROCEDURES: Critical Care performed: No .1-3 Lead EKG Interpretation  Performed by: Naaman Plummer, MD Authorized by: Naaman Plummer, MD     Interpretation: normal     ECG rate:  91   ECG rate assessment: normal     Rhythm: sinus rhythm     Ectopy: none     Conduction: normal    MEDICATIONS ORDERED IN ED: Medications  dextrose 50 % solution (has no administration in time range)  dextrose 50 % solution 50 mL (50 mLs Intravenous Given 08/11/22 1925)  LORazepam (ATIVAN) injection 0.5 mg (0.5 mg Intravenous Given 08/11/22 2203)   IMPRESSION / MDM /  ASSESSMENT AND PLAN / ED COURSE  I reviewed the triage vital signs and the nursing notes.              Patient's presentation is most consistent with acute presentation with potential threat to life or bodily function. 61 year old type II diabetic presents BIBA for hypoglycemia, slurred speech, facial droop, and right upper extremity weakness that resolved after glucose administration  Given History, Exam, and Workup I have a low suspicion for sepsis induced hypoglycemia, long lasting medication overdose, suicidal ideation/purposeful overdose attempt.  Reassessment: Patient maintained normoglycemia throughout the emergency department course and MRI shows no evidence of stroke. After observation in the emergency department for several hours, the Blood sugar has remained stable, and based on the history is expected that the Blood sugar should  remain stable after discharge. Disposition: Plan discharge home with close primary care follow-up   FINAL CLINICAL IMPRESSION(S) / ED DIAGNOSES   Final diagnoses:  Hypoglycemia  Slurred speech   Rx / DC Orders   ED Discharge Orders     None      Note:  This document was prepared using Dragon voice recognition software and may include unintentional dictation errors.   Naaman Plummer, MD 08/11/22 [phone]

## 2022-08-11 NOTE — ED Triage Notes (Signed)
Per Ems, Pt's family said she had facial droop, inability to speak, and left arm flaccid starting at 6pm. Per EMS, It had rectified bu the time they arrived. While speaking to pt, Pt had facial drooping, inability to speak, and left arm flaccid for approximately 5 minutes. Pt was in Afib with no HX. Pt has Hx of stroke and below the knee amputation.

## 2022-08-11 NOTE — ED Notes (Signed)
Patient transported to MRI 

## 2022-08-11 NOTE — ED Notes (Signed)
PT given phone due to MRI asking questions.

## 2023-08-15 ENCOUNTER — Other Ambulatory Visit: Payer: Self-pay | Admitting: Primary Care

## 2023-08-15 DIAGNOSIS — Z1231 Encounter for screening mammogram for malignant neoplasm of breast: Secondary | ICD-10-CM

## 2024-01-05 ENCOUNTER — Other Ambulatory Visit: Payer: Self-pay

## 2024-01-05 ENCOUNTER — Emergency Department

## 2024-01-05 DIAGNOSIS — N170 Acute kidney failure with tubular necrosis: Secondary | ICD-10-CM | POA: Diagnosis not present

## 2024-01-05 DIAGNOSIS — E11649 Type 2 diabetes mellitus with hypoglycemia without coma: Secondary | ICD-10-CM | POA: Diagnosis present

## 2024-01-05 DIAGNOSIS — I5031 Acute diastolic (congestive) heart failure: Secondary | ICD-10-CM | POA: Diagnosis not present

## 2024-01-05 DIAGNOSIS — Z6841 Body Mass Index (BMI) 40.0 and over, adult: Secondary | ICD-10-CM

## 2024-01-05 DIAGNOSIS — I4892 Unspecified atrial flutter: Secondary | ICD-10-CM | POA: Diagnosis present

## 2024-01-05 DIAGNOSIS — I468 Cardiac arrest due to other underlying condition: Secondary | ICD-10-CM | POA: Diagnosis present

## 2024-01-05 DIAGNOSIS — E119 Type 2 diabetes mellitus without complications: Secondary | ICD-10-CM | POA: Diagnosis not present

## 2024-01-05 DIAGNOSIS — I4891 Unspecified atrial fibrillation: Secondary | ICD-10-CM | POA: Diagnosis present

## 2024-01-05 DIAGNOSIS — E66813 Obesity, class 3: Secondary | ICD-10-CM | POA: Diagnosis present

## 2024-01-05 DIAGNOSIS — E872 Acidosis, unspecified: Secondary | ICD-10-CM | POA: Diagnosis not present

## 2024-01-05 DIAGNOSIS — Z66 Do not resuscitate: Secondary | ICD-10-CM | POA: Diagnosis present

## 2024-01-05 DIAGNOSIS — K219 Gastro-esophageal reflux disease without esophagitis: Secondary | ICD-10-CM | POA: Diagnosis present

## 2024-01-05 DIAGNOSIS — E7841 Elevated Lipoprotein(a): Secondary | ICD-10-CM | POA: Diagnosis present

## 2024-01-05 DIAGNOSIS — I469 Cardiac arrest, cause unspecified: Secondary | ICD-10-CM | POA: Diagnosis present

## 2024-01-05 DIAGNOSIS — I5041 Acute combined systolic (congestive) and diastolic (congestive) heart failure: Secondary | ICD-10-CM | POA: Diagnosis not present

## 2024-01-05 DIAGNOSIS — G931 Anoxic brain damage, not elsewhere classified: Secondary | ICD-10-CM | POA: Diagnosis present

## 2024-01-05 DIAGNOSIS — Z7189 Other specified counseling: Secondary | ICD-10-CM | POA: Diagnosis not present

## 2024-01-05 DIAGNOSIS — G9341 Metabolic encephalopathy: Secondary | ICD-10-CM | POA: Diagnosis not present

## 2024-01-05 DIAGNOSIS — Z955 Presence of coronary angioplasty implant and graft: Secondary | ICD-10-CM

## 2024-01-05 DIAGNOSIS — J9811 Atelectasis: Secondary | ICD-10-CM | POA: Diagnosis present

## 2024-01-05 DIAGNOSIS — Z789 Other specified health status: Secondary | ICD-10-CM | POA: Diagnosis not present

## 2024-01-05 DIAGNOSIS — Z9889 Other specified postprocedural states: Secondary | ICD-10-CM

## 2024-01-05 DIAGNOSIS — J9602 Acute respiratory failure with hypercapnia: Secondary | ICD-10-CM | POA: Diagnosis present

## 2024-01-05 DIAGNOSIS — G253 Myoclonus: Secondary | ICD-10-CM | POA: Diagnosis not present

## 2024-01-05 DIAGNOSIS — J449 Chronic obstructive pulmonary disease, unspecified: Secondary | ICD-10-CM | POA: Diagnosis present

## 2024-01-05 DIAGNOSIS — Z1152 Encounter for screening for COVID-19: Secondary | ICD-10-CM

## 2024-01-05 DIAGNOSIS — F419 Anxiety disorder, unspecified: Secondary | ICD-10-CM | POA: Diagnosis present

## 2024-01-05 DIAGNOSIS — I5043 Acute on chronic combined systolic (congestive) and diastolic (congestive) heart failure: Secondary | ICD-10-CM | POA: Diagnosis present

## 2024-01-05 DIAGNOSIS — I509 Heart failure, unspecified: Secondary | ICD-10-CM | POA: Diagnosis not present

## 2024-01-05 DIAGNOSIS — Z8673 Personal history of transient ischemic attack (TIA), and cerebral infarction without residual deficits: Secondary | ICD-10-CM

## 2024-01-05 DIAGNOSIS — I739 Peripheral vascular disease, unspecified: Secondary | ICD-10-CM | POA: Diagnosis present

## 2024-01-05 DIAGNOSIS — Z888 Allergy status to other drugs, medicaments and biological substances status: Secondary | ICD-10-CM

## 2024-01-05 DIAGNOSIS — E876 Hypokalemia: Secondary | ICD-10-CM | POA: Diagnosis not present

## 2024-01-05 DIAGNOSIS — G4733 Obstructive sleep apnea (adult) (pediatric): Secondary | ICD-10-CM | POA: Diagnosis present

## 2024-01-05 DIAGNOSIS — Z515 Encounter for palliative care: Secondary | ICD-10-CM

## 2024-01-05 DIAGNOSIS — E1151 Type 2 diabetes mellitus with diabetic peripheral angiopathy without gangrene: Secondary | ICD-10-CM | POA: Diagnosis present

## 2024-01-05 DIAGNOSIS — I214 Non-ST elevation (NSTEMI) myocardial infarction: Secondary | ICD-10-CM | POA: Diagnosis present

## 2024-01-05 DIAGNOSIS — J9601 Acute respiratory failure with hypoxia: Secondary | ICD-10-CM | POA: Diagnosis present

## 2024-01-05 DIAGNOSIS — D3502 Benign neoplasm of left adrenal gland: Secondary | ICD-10-CM | POA: Diagnosis present

## 2024-01-05 DIAGNOSIS — I110 Hypertensive heart disease with heart failure: Secondary | ICD-10-CM | POA: Diagnosis present

## 2024-01-05 DIAGNOSIS — Z794 Long term (current) use of insulin: Secondary | ICD-10-CM

## 2024-01-05 DIAGNOSIS — Z89511 Acquired absence of right leg below knee: Secondary | ICD-10-CM | POA: Diagnosis not present

## 2024-01-05 DIAGNOSIS — N179 Acute kidney failure, unspecified: Secondary | ICD-10-CM | POA: Diagnosis not present

## 2024-01-05 DIAGNOSIS — I2510 Atherosclerotic heart disease of native coronary artery without angina pectoris: Secondary | ICD-10-CM | POA: Diagnosis present

## 2024-01-05 DIAGNOSIS — D649 Anemia, unspecified: Secondary | ICD-10-CM | POA: Diagnosis not present

## 2024-01-05 DIAGNOSIS — Z711 Person with feared health complaint in whom no diagnosis is made: Secondary | ICD-10-CM | POA: Diagnosis not present

## 2024-01-05 DIAGNOSIS — M797 Fibromyalgia: Secondary | ICD-10-CM | POA: Diagnosis present

## 2024-01-05 DIAGNOSIS — R569 Unspecified convulsions: Secondary | ICD-10-CM | POA: Diagnosis not present

## 2024-01-05 DIAGNOSIS — I4510 Unspecified right bundle-branch block: Secondary | ICD-10-CM | POA: Diagnosis present

## 2024-01-05 DIAGNOSIS — Z7984 Long term (current) use of oral hypoglycemic drugs: Secondary | ICD-10-CM

## 2024-01-05 DIAGNOSIS — Z7985 Long-term (current) use of injectable non-insulin antidiabetic drugs: Secondary | ICD-10-CM

## 2024-01-05 DIAGNOSIS — Z79899 Other long term (current) drug therapy: Secondary | ICD-10-CM

## 2024-01-05 DIAGNOSIS — Z720 Tobacco use: Secondary | ICD-10-CM

## 2024-01-05 DIAGNOSIS — G4089 Other seizures: Secondary | ICD-10-CM | POA: Diagnosis not present

## 2024-01-05 DIAGNOSIS — I10 Essential (primary) hypertension: Secondary | ICD-10-CM | POA: Diagnosis present

## 2024-01-05 LAB — CBG MONITORING, ED
Glucose-Capillary: 100 mg/dL — ABNORMAL HIGH (ref 70–99)
Glucose-Capillary: 39 mg/dL — CL (ref 70–99)
Glucose-Capillary: 50 mg/dL — ABNORMAL LOW (ref 70–99)
Glucose-Capillary: 82 mg/dL (ref 70–99)

## 2024-01-05 LAB — CBC WITH DIFFERENTIAL/PLATELET
Abs Immature Granulocytes: 0.04 10*3/uL (ref 0.00–0.07)
Basophils Absolute: 0.1 10*3/uL (ref 0.0–0.1)
Basophils Relative: 1 %
Eosinophils Absolute: 0.1 10*3/uL (ref 0.0–0.5)
Eosinophils Relative: 1 %
HCT: 35.5 % — ABNORMAL LOW (ref 36.0–46.0)
Hemoglobin: 9.7 g/dL — ABNORMAL LOW (ref 12.0–15.0)
Immature Granulocytes: 0 %
Lymphocytes Relative: 19 %
Lymphs Abs: 2.2 10*3/uL (ref 0.7–4.0)
MCH: 21.3 pg — ABNORMAL LOW (ref 26.0–34.0)
MCHC: 27.3 g/dL — ABNORMAL LOW (ref 30.0–36.0)
MCV: 77.9 fL — ABNORMAL LOW (ref 80.0–100.0)
Monocytes Absolute: 1.5 10*3/uL — ABNORMAL HIGH (ref 0.1–1.0)
Monocytes Relative: 13 %
Neutro Abs: 7.9 10*3/uL — ABNORMAL HIGH (ref 1.7–7.7)
Neutrophils Relative %: 66 %
Platelets: 450 10*3/uL — ABNORMAL HIGH (ref 150–400)
RBC: 4.56 MIL/uL (ref 3.87–5.11)
RDW: 17.3 % — ABNORMAL HIGH (ref 11.5–15.5)
WBC: 11.9 10*3/uL — ABNORMAL HIGH (ref 4.0–10.5)
nRBC: 0.3 % — ABNORMAL HIGH (ref 0.0–0.2)

## 2024-01-05 LAB — COMPREHENSIVE METABOLIC PANEL WITH GFR
ALT: 26 U/L (ref 0–44)
AST: 24 U/L (ref 15–41)
Albumin: 3.1 g/dL — ABNORMAL LOW (ref 3.5–5.0)
Alkaline Phosphatase: 95 U/L (ref 38–126)
Anion gap: 10 (ref 5–15)
BUN: 27 mg/dL — ABNORMAL HIGH (ref 8–23)
CO2: 26 mmol/L (ref 22–32)
Calcium: 9.2 mg/dL (ref 8.9–10.3)
Chloride: 105 mmol/L (ref 98–111)
Creatinine, Ser: 1.21 mg/dL — ABNORMAL HIGH (ref 0.44–1.00)
GFR, Estimated: 50 mL/min — ABNORMAL LOW (ref >60)
Glucose, Bld: 58 mg/dL — ABNORMAL LOW (ref 70–99)
Potassium: 3.7 mmol/L (ref 3.5–5.1)
Sodium: 141 mmol/L (ref 135–145)
Total Bilirubin: 0.6 mg/dL (ref 0.0–1.2)
Total Protein: 7.2 g/dL (ref 6.5–8.1)

## 2024-01-05 LAB — RESP PANEL BY RT-PCR (RSV, FLU A&B, COVID)  RVPGX2
Influenza A by PCR: NEGATIVE
Influenza B by PCR: NEGATIVE
Resp Syncytial Virus by PCR: NEGATIVE
SARS Coronavirus 2 by RT PCR: NEGATIVE

## 2024-01-05 LAB — TROPONIN I (HIGH SENSITIVITY)
Troponin I (High Sensitivity): 1367 ng/L (ref <18)
Troponin I (High Sensitivity): 1686 ng/L (ref <18)

## 2024-01-05 LAB — BRAIN NATRIURETIC PEPTIDE: B Natriuretic Peptide: 1263.5 pg/mL — ABNORMAL HIGH (ref 0.0–100.0)

## 2024-01-05 LAB — LIPASE, BLOOD: Lipase: 21 U/L (ref 11–51)

## 2024-01-05 LAB — PROTIME-INR
INR: 1.4 — ABNORMAL HIGH (ref 0.8–1.2)
Prothrombin Time: 17.7 s — ABNORMAL HIGH (ref 11.4–15.2)

## 2024-01-05 LAB — APTT: aPTT: 87 s — ABNORMAL HIGH (ref 24–36)

## 2024-01-05 MED ORDER — PANTOPRAZOLE SODIUM 40 MG PO TBEC
40.0000 mg | DELAYED_RELEASE_TABLET | Freq: Every day | ORAL | Status: DC
  Administered 2024-01-06: 40 mg via ORAL
  Filled 2024-01-05: qty 1

## 2024-01-05 MED ORDER — DULOXETINE HCL 30 MG PO CPEP
60.0000 mg | ORAL_CAPSULE | Freq: Every day | ORAL | Status: DC
  Administered 2024-01-06 – 2024-01-07 (×2): 60 mg via ORAL
  Filled 2024-01-05: qty 1
  Filled 2024-01-05: qty 2

## 2024-01-05 MED ORDER — DEXTROSE 50 % IV SOLN
1.0000 | INTRAVENOUS | Status: DC | PRN
  Administered 2024-01-12: 50 mL via INTRAVENOUS
  Filled 2024-01-05: qty 50

## 2024-01-05 MED ORDER — METOPROLOL TARTRATE 5 MG/5ML IV SOLN
5.0000 mg | Freq: Once | INTRAVENOUS | Status: AC
  Administered 2024-01-05: 5 mg via INTRAVENOUS
  Filled 2024-01-05: qty 5

## 2024-01-05 MED ORDER — DEXTROSE 50 % IV SOLN
50.0000 mL | Freq: Once | INTRAVENOUS | Status: AC
  Administered 2024-01-05: 50 mL via INTRAVENOUS

## 2024-01-05 MED ORDER — DEXTROSE 50 % IV SOLN
INTRAVENOUS | Status: AC
  Filled 2024-01-05: qty 50

## 2024-01-05 MED ORDER — TRAZODONE HCL 100 MG PO TABS
100.0000 mg | ORAL_TABLET | Freq: Every day | ORAL | Status: DC
  Administered 2024-01-06: 100 mg via ORAL
  Filled 2024-01-05: qty 1

## 2024-01-05 MED ORDER — DEXTROSE 10 % IV SOLN: INTRAVENOUS | Status: DC

## 2024-01-05 MED ORDER — OLANZAPINE 5 MG PO TABS
5.0000 mg | ORAL_TABLET | Freq: Every day | ORAL | Status: DC
  Administered 2024-01-06: 5 mg via ORAL
  Filled 2024-01-05: qty 1

## 2024-01-05 MED ORDER — DILTIAZEM HCL-DEXTROSE 125-5 MG/125ML-% IV SOLN (PREMIX)
5.0000 mg/h | INTRAVENOUS | Status: DC
Start: 2024-01-05 — End: 2024-01-06
  Administered 2024-01-05: 5 mg/h via INTRAVENOUS
  Filled 2024-01-05: qty 125

## 2024-01-05 MED ORDER — ASPIRIN 81 MG PO TBEC
81.0000 mg | DELAYED_RELEASE_TABLET | Freq: Every day | ORAL | Status: DC
Start: 2024-01-05 — End: 2024-01-08
  Administered 2024-01-05 – 2024-01-07 (×3): 81 mg via ORAL
  Filled 2024-01-05 (×3): qty 1

## 2024-01-05 MED ORDER — HEPARIN BOLUS VIA INFUSION
4000.0000 [IU] | Freq: Once | INTRAVENOUS | Status: AC
  Administered 2024-01-05: 4000 [IU] via INTRAVENOUS
  Filled 2024-01-05: qty 4000

## 2024-01-05 MED ORDER — ENALAPRIL MALEATE 5 MG PO TABS
5.0000 mg | ORAL_TABLET | Freq: Every day | ORAL | Status: DC
  Administered 2024-01-06: 5 mg via ORAL
  Filled 2024-01-05: qty 1

## 2024-01-05 MED ORDER — ONDANSETRON HCL 4 MG/2ML IJ SOLN: 4.0000 mg | Freq: Four times a day (QID) | INTRAMUSCULAR | Status: DC | PRN

## 2024-01-05 MED ORDER — DILTIAZEM HCL-DEXTROSE 125-5 MG/125ML-% IV SOLN (PREMIX)
5.0000 mg/h | INTRAVENOUS | Status: DC
  Filled 2024-01-05: qty 125

## 2024-01-05 MED ORDER — DILTIAZEM LOAD VIA INFUSION
15.0000 mg | Freq: Once | INTRAVENOUS | Status: AC
  Administered 2024-01-05: 15 mg via INTRAVENOUS
  Filled 2024-01-05: qty 15

## 2024-01-05 MED ORDER — HEPARIN (PORCINE) 25000 UT/250ML-% IV SOLN
1100.0000 [IU]/h | INTRAVENOUS | Status: DC
  Administered 2024-01-05: 1100 [IU]/h via INTRAVENOUS
  Filled 2024-01-05: qty 250

## 2024-01-05 MED ORDER — ATORVASTATIN CALCIUM 20 MG PO TABS
40.0000 mg | ORAL_TABLET | Freq: Every day | ORAL | Status: DC
  Administered 2024-01-05 – 2024-01-07 (×3): 40 mg via ORAL
  Filled 2024-01-05 (×3): qty 2

## 2024-01-05 MED ORDER — FUROSEMIDE 10 MG/ML IJ SOLN
20.0000 mg | Freq: Two times a day (BID) | INTRAMUSCULAR | Status: DC
  Administered 2024-01-05 – 2024-01-11 (×13): 20 mg via INTRAVENOUS
  Filled 2024-01-05: qty 4
  Filled 2024-01-05 (×2): qty 2
  Filled 2024-01-05: qty 4
  Filled 2024-01-05 (×9): qty 2

## 2024-01-05 MED ORDER — IOHEXOL 350 MG/ML SOLN
75.0000 mL | Freq: Once | INTRAVENOUS | Status: AC | PRN
  Administered 2024-01-05: 75 mL via INTRAVENOUS

## 2024-01-05 MED ORDER — INSULIN ASPART 100 UNIT/ML IJ SOLN: 0.0000 [IU] | Freq: Three times a day (TID) | INTRAMUSCULAR | Status: DC

## 2024-01-05 MED ORDER — NITROGLYCERIN 0.4 MG SL SUBL: 0.4000 mg | SUBLINGUAL_TABLET | SUBLINGUAL | Status: DC | PRN

## 2024-01-05 MED ORDER — BUPROPION HCL ER (XL) 300 MG PO TB24
300.0000 mg | ORAL_TABLET | Freq: Every day | ORAL | Status: DC
  Administered 2024-01-06 – 2024-01-07 (×2): 300 mg via ORAL
  Filled 2024-01-05: qty 2
  Filled 2024-01-05 (×2): qty 1

## 2024-01-05 MED ORDER — INSULIN ASPART 100 UNIT/ML IJ SOLN: 0.0000 [IU] | Freq: Every day | INTRAMUSCULAR | Status: DC

## 2024-01-05 MED ORDER — DEXTROSE 50 % IV SOLN
INTRAVENOUS | Status: AC
  Administered 2024-01-05: 50 mL via INTRAVENOUS
  Filled 2024-01-05: qty 50

## 2024-01-05 MED ORDER — ACETAMINOPHEN 325 MG PO TABS
650.0000 mg | ORAL_TABLET | ORAL | Status: DC | PRN
  Filled 2024-01-05: qty 2

## 2024-01-05 MED ORDER — SODIUM CHLORIDE 0.9 % IV BOLUS
1000.0000 mL | Freq: Once | INTRAVENOUS | Status: AC
  Administered 2024-01-05: 1000 mL via INTRAVENOUS

## 2024-01-05 NOTE — Assessment & Plan Note (Signed)
 Hypoglycemic to 46 with EMS receiving glucagon, subsequently received D50 on arrival to the ED Patient noted to be on Humulin U-500 as well as Trulicity and metformin Hold insulin products and oral hypoglycemic agents CBG every hour until stabilized then start with sliding scale coverage Will get A1c

## 2024-01-05 NOTE — Assessment & Plan Note (Addendum)
 Patient with dyspnea on exertion, BNP 1263, mild pulmonary edema on chest x-ray Mildly hypoxic to 91% in the ED requiring 4 L to maintain sats in the mid 90s Likely triggered by rapid A-fib, post NSTEMI  echocardiogram to evaluate left ventricular systolic function.  Last echo 08/2020 was normal Patient is not clinically fluid overloaded Low-dose IV Lasix  to evaluate for symptomatic relief Daily weights with intake and output monitoring Cardiology consult

## 2024-01-05 NOTE — ED Notes (Signed)
 Lab called and on the way to come and get labs

## 2024-01-05 NOTE — Consult Note (Signed)
 PHARMACY - ANTICOAGULATION CONSULT NOTE  Pharmacy Consult for IV Heparin Indication: chest pain/ACS  Patient Measurements: Height: 5' 5.5" (166.4 cm) Weight: 117.9 kg (260 lb) IBW/kg (Calculated) : 58.15 HEPARIN DW (KG): 86.3  Labs: Recent Labs    01/05/24 1547  HGB 9.7*  HCT 35.5*  PLT 450*  CREATININE 1.21*  TROPONINIHS 1,367*    Estimated Creatinine Clearance: 61.7 mL/min (A) (by C-G formula based on SCr of 1.21 mg/dL (H)).   Medical History: Past Medical History:  Diagnosis Date   Anemia    Anxiety    Arthritis    CHF (congestive heart failure) (HCC)    COPD (chronic obstructive pulmonary disease) (HCC)    Coronary artery disease    Diabetes mellitus without complication (HCC)    Fibromyalgia    GERD (gastroesophageal reflux disease)    Hypertension    Peripheral vascular disease (HCC)    Sleep apnea    Stroke (HCC)     Medications:  No anticoagulation prior to admission per my chart review. Medication reconciliation is pending  Assessment: 63 y/o F with medical history as above presenting to the ED 4/11 with weakness, SOB. Troponin elevated. Pharmacy consulted to manage heparin infusion for ACS.  Baseline aPTT and INR are pending. Baseline CBC notable for anemia which appears c/w baseline.  Goal of Therapy:  Heparin level 0.3-0.7 units/ml Monitor platelets by anticoagulation protocol: Yes   Plan:  --Heparin 4000 unit IV bolus followed by continuous infusion at 1100 units/hr --Heparin level 6 hours from initiation of infusion --Daily CBC per protocol while on IV heparin  Tressie Ellis 01/05/2024,4:54 PM

## 2024-01-05 NOTE — ED Notes (Signed)
 MD Anner Crete informed of pt trop of 1367

## 2024-01-05 NOTE — ED Notes (Signed)
 Unable to gain 2nd IV, consult placed after multiple attempts

## 2024-01-05 NOTE — Assessment & Plan Note (Signed)
 Complicating factor to overall prognosis and care

## 2024-01-05 NOTE — Assessment & Plan Note (Signed)
 Received IV metoprolol 5 mg x 2 while in the ED (diltiazem not given due to concern for CHF and NSTEMI) Start diltiazem infusion as patient still tachycardic to the 130s Continue heparin infusion Echocardiogram Cardiology consult

## 2024-01-05 NOTE — ED Notes (Signed)
 Report given to RN

## 2024-01-05 NOTE — Assessment & Plan Note (Signed)
 CPAP qhs

## 2024-01-05 NOTE — ED Triage Notes (Signed)
 Pt comes via EMs from home with c/o weakness.  Pt states sob and trouble breathing. Pt does take metoprolol  and hx of chf and afib.   Pt is right above knee amputation. Pt currently RVR 140-180, EMS attempted IV with no success. Pt CBG 46, pt given 1mg  glucagon.  BP 140/80 HR 140-180 O2 100% 2L [place]

## 2024-01-05 NOTE — ED Provider Notes (Signed)
 [facility] Provider Note    Event Date/Time   First MD Initiated Contact with Patient 01/05/24 1537     (approximate)   History   Weakness   HPI [name] is a 63 y.o. female with history of HTN, DM2, HLD, OSA presenting today for shortness of breath.  Patient notes over the past 3 days she has felt more short of breath and fatigued than normal.  Denies any cough, congestion, chest pain, abdominal pain.  Intermittent nausea but no vomiting.  No dysuria symptoms.  No leg swelling.  Wears CPAP at night but no oxygen during the daytime.  Found to be in A-fib with RVR with EMS with no history of this.  Not on a blood thinner.     Physical Exam   Triage Vital Signs: ED Triage Vitals [01/05/24 1540]  Encounter Vitals Group     BP      Systolic BP Percentile      Diastolic BP Percentile      Pulse      Resp      Temp      Temp src      SpO2      Weight 260 lb (117.9 kg)     Height 5' 5.5" (1.664 m)     Head Circumference      Peak Flow      Pain Score 2     Pain Loc      Pain Education      Exclude from Growth Chart     Most recent vital signs: Vitals:   01/05/24 1730 01/05/24 1815  BP: 95/82 108/83  Pulse:    Resp: 16 19  Temp:    SpO2:      Physical Exam: I have reviewed the vital signs and nursing notes. General: Awake, alert, no acute distress.  Nontoxic appearing. Head:  Atraumatic, normocephalic.   ENT:  EOM intact, PERRL. Oral mucosa is pink and moist with no lesions. Neck: Neck is supple with full range of motion, No meningeal signs. Cardiovascular: Tachycardia with irregular rhythm, No murmurs. Peripheral pulses palpable and equal bilaterally. Respiratory:  Symmetrical chest wall expansion.  No rhonchi, rales, or wheezes.  Good air movement throughout.  No use of accessory muscles.   Musculoskeletal:  No cyanosis or edema. Moving extremities with full ROM.  AKA to right lower extremity. Abdomen:  Soft, nontender,  nondistended. Neuro:  GCS 15, moving all four extremities, interacting appropriately. Speech clear. Psych:  Calm, appropriate.   Skin:  Warm, dry, no rash.    ED Results / Procedures / Treatments   Labs (all labs ordered are listed, but only abnormal results are displayed) Labs Reviewed  CBC WITH DIFFERENTIAL/PLATELET - Abnormal; Notable for the following components:      Result Value   WBC 11.9 (*)    Hemoglobin 9.7 (*)    HCT 35.5 (*)    MCV 77.9 (*)    MCH 21.3 (*)    MCHC 27.3 (*)    RDW 17.3 (*)    Platelets 450 (*)    nRBC 0.3 (*)    Neutro Abs 7.9 (*)    Monocytes Absolute 1.5 (*)    All other components within normal limits  COMPREHENSIVE METABOLIC PANEL WITH GFR - Abnormal; Notable for the following components:   Glucose, Bld 58 (*)    BUN 27 (*)    Creatinine, Ser 1.21 (*)    Albumin 3.1 (*)  GFR, Estimated 50 (*)    All other components within normal limits  BRAIN NATRIURETIC PEPTIDE - Abnormal; Notable for the following components:   B Natriuretic Peptide 1,263.5 (*)    All other components within normal limits  APTT - Abnormal; Notable for the following components:   aPTT 87 (*)    All other components within normal limits  PROTIME-INR - Abnormal; Notable for the following components:   Prothrombin Time 17.7 (*)    INR 1.4 (*)    All other components within normal limits  CBG MONITORING, ED - Abnormal; Notable for the following components:   Glucose-Capillary 39 (*)    All other components within normal limits  CBG MONITORING, ED - Abnormal; Notable for the following components:   Glucose-Capillary 100 (*)    All other components within normal limits  TROPONIN I (HIGH SENSITIVITY) - Abnormal; Notable for the following components:   Troponin I (High Sensitivity) 1,367 (*)    All other components within normal limits  TROPONIN I (HIGH SENSITIVITY) - Abnormal; Notable for the following components:   Troponin I (High Sensitivity) 1,686 (*)    All other  components within normal limits  RESP PANEL BY RT-PCR (RSV, FLU A&B, COVID)  RVPGX2  LIPASE, BLOOD  URINALYSIS, ROUTINE W REFLEX MICROSCOPIC  HEPARIN LEVEL (UNFRACTIONATED)  CBC     EKG My EKG interpretation: Rate of 139, A-fib with RVR.  Right bundle branch block.  No acute ST elevations or depressions   RADIOLOGY Independently interpreted chest x-ray and CTA chest.  No obvious evidence of pneumonia but some pleural effusions present.  No PE   PROCEDURES:  Critical Care performed: Yes, see critical care procedure note(s)  .Critical Care  Performed by: Janith Lima, MD Authorized by: Janith Lima, MD   Critical care provider statement:    Critical care time (minutes):  30   Critical care was necessary to treat or prevent imminent or life-threatening deterioration of the following conditions:  Cardiac failure (NSTEMI, A-fib with RVR)   Critical care was time spent personally by me on the following activities:  Development of treatment plan with patient or surrogate, discussions with consultants, evaluation of patient's response to treatment, examination of patient, ordering and review of laboratory studies, ordering and review of radiographic studies, ordering and performing treatments and interventions, pulse oximetry, re-evaluation of patient's condition and review of old charts   I assumed direction of critical care for this patient from another provider in my specialty: no     Care discussed with: admitting provider      MEDICATIONS ORDERED IN ED: Medications  heparin bolus via infusion 4,000 Units (4,000 Units Intravenous Bolus from Bag 01/05/24 1722)    Followed by  heparin ADULT infusion 100 units/mL (25000 units/220mL) (1,100 Units/hr Intravenous New Bag/Given 01/05/24 1723)  metoprolol tartrate (LOPRESSOR) injection 5 mg (has no administration in time range)  dextrose 50 % solution 50 mL (50 mLs Intravenous Given 01/05/24 1543)  sodium chloride 0.9 % bolus 1,000 mL  (1,000 mLs Intravenous New Bag/Given 01/05/24 1623)  metoprolol tartrate (LOPRESSOR) injection 5 mg (5 mg Intravenous Given 01/05/24 1717)  iohexol (OMNIPAQUE) 350 MG/ML injection 75 mL (75 mLs Intravenous Contrast Given 01/05/24 1801)     IMPRESSION / MDM / ASSESSMENT AND PLAN / ED COURSE  I reviewed the triage vital signs and the nursing notes.  Differential diagnosis includes, but is not limited to, dehydration, hypoglycemia, A-fib with RVR, electrolyte abnormality, COVID, flu, RSV, pneumonia  Patient's presentation is most consistent with acute presentation with potential threat to life or bodily function.  Patient is a 63 year old female presenting today for weakness and shortness of breath x 3 days.  On arrival she was found to be in A-fib with RVR with no prior history of the same.  Started on 1 L of fluid.  Separately she was also found to be hypoglycemic at 46.  She was given glucagon with EMS and then was still hypoglycemic on arrival and given 1 amp of D50.  Patient's blood sugar stable otherwise following this.  Laboratory workup notable for troponin elevation at 1367.  Rediscussed with patient she did note some heartburn yesterday evening.  EKG without obvious STEMI.  Will start on heparin for NSTEMI.  Also notable BNP elevation at 1263 concerning for some heart failure.  CTA chest ordered to rule out signs of PE with heart strain.  This was negative for PE but does have evidence of fluid on the chest consistent with heart failure.  Otherwise not hypoxic.  Given metoprolol 5 mg IV x 2 with no significant improvement in heart rate.  Patient will be admitted to hospitalist for further care.  The patient is on the cardiac monitor to evaluate for evidence of arrhythmia and/or significant heart rate changes. Clinical Course as of 01/05/24 1928  Fri Jan 05, 2024  1639 Troponin I (High Sensitivity)(!!): 1,367 Started on heparin drip [DW]  1644 Patient states she  had heartburn for 5 hours last night which then resolved. [DW]    Clinical Course User Index [DW] Janith Lima, MD     FINAL CLINICAL IMPRESSION(S) / ED DIAGNOSES   Final diagnoses:  NSTEMI (non-ST elevated myocardial infarction) (HCC)  Acute congestive heart failure, unspecified heart failure type (HCC)  Atrial fibrillation with RVR (HCC)     Rx / DC Orders   ED Discharge Orders     None        Note:  This document was prepared using Dragon voice recognition software and may include unintentional dictation errors.   Janith Lima, MD 01/05/24 [phone]

## 2024-01-05 NOTE — Assessment & Plan Note (Signed)
 BP controlled Continue home enalapril

## 2024-01-05 NOTE — ED Notes (Signed)
 MD Anner Crete at bedside

## 2024-01-05 NOTE — Assessment & Plan Note (Signed)
 History of right BKA History of left carotid endarterectomy History of lacunar infarct 2021 Continue atorvastatin.  Antiplatelets not seen on med list Continue heparin

## 2024-01-05 NOTE — Assessment & Plan Note (Addendum)
 CAD with history of PCI Possibly demand ischemia versus type I NSTEMI Patient had an episode of " heartburn" the day prior.  EKG nonacute Troponin [phone], will continue to trend Echo to evaluate for wall motion abnormality Continue heparin infusion Continue home atorvastatin 40 mg and enalapril.  Hold off beta-blocker to prevent bradycardia due to starting of diltiazem infusion Cardiology consult

## 2024-01-05 NOTE — H&P (Addendum)
 History and Physical    Patient: [name] ZOX:[id number] DOB: 12-25-1960 DOA: 01/05/2024 DOS: the patient was seen and examined on 01/05/2024 PCP: Ardia Kraft, NP  Patient coming from: Home  Chief Complaint:  Chief Complaint  Patient presents with   Weakness    HPI: [name] is a 63 y.o. female with medical history significant for CAD s/p PCI, PAD s/p right BKA and left carotid endarterectomy, prior CVA (2021), morbid obesity with OSA on CPAP, COPD, left sided diabetic hemichorea, being admitted with new onset A-fib with RVR and NSTEMI among other acute issues.  She presented with a 3-day history of fatigue and shortness of breath and on the night prior to arrival, she had an episode of " heartburn".  She endorses orthopnea.  Denies lower extremity swelling or pain.  She denies cough, congestion, fever or chills, abdominal pain, vomiting, diarrhea or dysuria.    EMS found her to be in A-fib with a rate of 140-180.  Her blood sugar was 46 for which she received 1 mg glucagon en route. ED course and data review: Heart rate in the 120s to 130s, SBP 120/89 on arrival.  Was borderline hypoxic to 91%, with improvement to 96% on 4 L.  Afebrile Labs notable for the following: Troponin 1367-->1686, BNP 1263 CBC notable for WBC of 12,000 and hemoglobin 9.7 (most recent baseline 11.3 a year ago) Creatinine 1.21, near baseline of 1.14 Respiratory viral panel negative for COVID flu and RSV EKG, personally reviewed and interpreted with A-fib at 139 and RBBB CTA chest PE protocol negative for PE-showed mild interstitial thickening at the lung bases suggesting mild pulmonary edema.  Also showed a benign left adrenal adenoma  Treatment in the ED: IV metoprolol 5 mg x 2 NS 1 L bolus Heparin bolus and infusion D50 50 mL x 1  Hospitalist consulted for admission.     Past Medical History:  Diagnosis Date   Anemia    Anxiety    Arthritis    CHF (congestive heart failure) (HCC)    COPD  (chronic obstructive pulmonary disease) (HCC)    Coronary artery disease    Diabetes mellitus without complication (HCC)    Fibromyalgia    GERD (gastroesophageal reflux disease)    Hypertension    Peripheral vascular disease (HCC)    Sleep apnea    Stroke [name])    Past Surgical History:  Procedure Laterality Date   CESAREAN SECTION     COLONOSCOPY WITH PROPOFOL N/A 08/02/2022   Procedure: COLONOSCOPY WITH PROPOFOL;  Surgeon: Marnee Sink, MD;  Location: [facility] ENDOSCOPY;  Service: Endoscopy;  Laterality: N/A;   CORONARY ANGIOPLASTY WITH STENT PLACEMENT Left    DILATION AND CURETTAGE OF UTERUS     ENDARTERECTOMY Left 01/29/2015   Procedure: ENDARTERECTOMY CAROTID;  Surgeon: Celso College, MD;  Location: [facility] ORS;  Service: Vascular;  Laterality: Left;   LOWER EXTREMITY ANGIOGRAPHY Left 09/24/2020   Procedure: Lower Extremity Angiography;  Surgeon: Celso College, MD;  Location: [facility] INVASIVE CV LAB;  Service: Cardiovascular;  Laterality: Left;   PERIPHERAL VASCULAR CATHETERIZATION Right 05/27/2015   Procedure: Lower Extremity Angiography;  Surgeon: Celso College, MD;  Location: [facility] INVASIVE CV LAB;  Service: Cardiovascular;  Laterality: Right;   PERIPHERAL VASCULAR CATHETERIZATION  05/27/2015   Procedure: Lower Extremity Intervention;  Surgeon: Celso College, MD;  Location: [facility] INVASIVE CV LAB;  Service: Cardiovascular;;   PERIPHERAL VASCULAR CATHETERIZATION Right 09/22/2016   Procedure: Lower Extremity Angiography;  Surgeon: Reymundo Caulk  Ileen Mallet, MD;  Location: [facility] INVASIVE CV LAB;  Service: Cardiovascular;  Laterality: Right;   TUBAL LIGATION     Social History:  reports that she quit smoking about 7 years ago. Her smoking use included cigarettes. She started smoking about 45 years ago. She has a 9.5 pack-year smoking history. She uses smokeless tobacco. She reports that she does not drink alcohol and does not use drugs.  Allergies  Allergen Reactions   Niacin And Related Nausea And Vomiting     History reviewed. No pertinent family history.  Prior to Admission medications   Medication Sig Start Date End Date Taking? Authorizing Provider  OLANZapine (ZYPREXA) 5 MG tablet Take 5 mg by mouth daily. 02/11/21  Yes [provider]  atorvastatin (LIPITOR) 40 MG tablet Take 40 mg by mouth daily.    [provider]  buPROPion (WELLBUTRIN XL) 300 MG 24 hr tablet Take 300 mg by mouth daily. Pt taking 300 mg by mouth daily    [provider]  cetirizine (ZYRTEC) 10 MG tablet Take 10 mg by mouth daily.    [provider]  collagenase (SANTYL) ointment Apply topically daily. 09/26/20   Alphonsus Jeans, MD  Dulaglutide (TRULICITY) 0.75 MG/0.5ML SOPN Inject 0.75 mg into the skin every Tuesday. Patient not taking: Reported on 06/24/2022    [provider]  DULoxetine (CYMBALTA) 60 MG capsule Take 60 mg by mouth daily.    [provider]  enalapril (VASOTEC) 5 MG tablet Take 5 mg by mouth daily.    [provider]  gabapentin (NEURONTIN) 300 MG capsule Take 300-600 mg by mouth at bedtime.    [provider]  HUMULIN R U-500 KWIKPEN 500 UNIT/ML kwikpen Inject 50-70 Units into the skin See admin instructions. Inject 70u under the skin every morning before breakfast and inject 50u under the skin every evening before supper    [provider]  metFORMIN (GLUCOPHAGE-XR) 500 MG 24 hr tablet Take 500 mg by mouth daily with supper.    [provider]  oxybutynin (DITROPAN-XL) 10 MG 24 hr tablet Take 10 mg by mouth daily.    [provider]  pantoprazole (PROTONIX) 40 MG tablet Take 40 mg by mouth daily.    [provider]  traZODone (DESYREL) 50 MG tablet Take 100 mg by mouth at bedtime.    [provider]    Physical Exam: Vitals:   01/05/24 1730 01/05/24 1815 01/05/24 1948 01/05/24 1948  BP: 95/82 108/83 111/82   Pulse:   (!) 130   Resp: 16 19 (!) 26   Temp:    98 F (36.7 C)   TempSrc:    Oral  SpO2:   96%   Weight:      Height:       Physical Exam Vitals and nursing note reviewed.  Constitutional:      Appearance: She is obese.     Comments: Sitting upright on the bed which she said helps breathe better.  Conversational dyspnea  HENT:     Head: Normocephalic and atraumatic.  Cardiovascular:     Rate and Rhythm: Tachycardia present. Rhythm irregular.     Heart sounds: Normal heart sounds.  Pulmonary:     Effort: Tachypnea present.     Breath sounds: Rales present.  Abdominal:     Palpations: Abdomen is soft.     Tenderness: There is no abdominal tenderness.  Musculoskeletal:     Left lower leg: No edema.  Comments: Right BKA  Neurological:     Mental Status: Mental status is at baseline.     Labs on Admission: I have personally reviewed following labs and imaging studies  CBC: Recent Labs  Lab 01/05/24 1547  WBC 11.9*  NEUTROABS 7.9*  HGB 9.7*  HCT 35.5*  MCV 77.9*  PLT 450*   Basic Metabolic Panel: Recent Labs  Lab 01/05/24 1547  NA 141  K 3.7  CL 105  CO2 26  GLUCOSE 58*  BUN 27*  CREATININE 1.21*  CALCIUM 9.2   GFR: Estimated Creatinine Clearance: 61.7 mL/min (A) (by C-G formula based on SCr of 1.21 mg/dL (H)). Liver Function Tests: Recent Labs  Lab 01/05/24 1547  AST 24  ALT 26  ALKPHOS 95  BILITOT 0.6  PROT 7.2  ALBUMIN 3.1*   Recent Labs  Lab 01/05/24 1547  LIPASE 21   No results for input(s): "AMMONIA" in the last 168 hours. Coagulation Profile: Recent Labs  Lab 01/05/24 1839  INR 1.4*   Cardiac Enzymes: No results for input(s): "CKTOTAL", "CKMB", "CKMBINDEX", "TROPONINI" in the last 168 hours. BNP (last 3 results) No results for input(s): "PROBNP" in the last 8760 hours. HbA1C: No results for input(s): "HGBA1C" in the last 72 hours. CBG: Recent Labs  Lab 01/05/24 1541 01/05/24 1613  GLUCAP 39* 100*   Lipid Profile: No results for input(s): "CHOL", "HDL", "LDLCALC", "TRIG", "CHOLHDL",  "LDLDIRECT" in the last 72 hours. Thyroid Function Tests: No results for input(s): "TSH", "T4TOTAL", "FREET4", "T3FREE", "THYROIDAB" in the last 72 hours. Anemia Panel: No results for input(s): "VITAMINB12", "FOLATE", "FERRITIN", "TIBC", "IRON", "RETICCTPCT" in the last 72 hours. Urine analysis:    Component Value Date/Time   COLORURINE YELLOW (A) 08/11/2022 2151   APPEARANCEUR CLEAR (A) 08/11/2022 2151   APPEARANCEUR Clear 01/24/2014 1456   LABSPEC 1.005 08/11/2022 2151   LABSPEC 1.006 01/24/2014 1456   PHURINE 6.0 08/11/2022 2151   GLUCOSEU NEGATIVE 08/11/2022 2151   GLUCOSEU Negative 01/24/2014 1456   HGBUR NEGATIVE 08/11/2022 2151   BILIRUBINUR NEGATIVE 08/11/2022 2151   BILIRUBINUR Negative 01/24/2014 1456   KETONESUR NEGATIVE 08/11/2022 2151   PROTEINUR >=300 (A) 08/11/2022 2151   NITRITE NEGATIVE 08/11/2022 2151   LEUKOCYTESUR NEGATIVE 08/11/2022 2151   LEUKOCYTESUR Negative 01/24/2014 1456    Radiological Exams on Admission: CT Angio Chest PE W and/or Wo Contrast Result Date: 01/05/2024 CLINICAL DATA:  Short of breath for 3 days. EXAM: CT ANGIOGRAPHY CHEST WITH CONTRAST TECHNIQUE: Multidetector CT imaging of the chest was performed using the standard protocol during bolus administration of intravenous contrast. Multiplanar CT image reconstructions and MIPs were obtained to evaluate the vascular anatomy. RADIATION DOSE REDUCTION: This exam was performed according to the departmental dose-optimization program which includes automated exposure control, adjustment of the mA and/or kV according to patient size and/or use of iterative reconstruction technique. CONTRAST:  75mL OMNIPAQUE IOHEXOL 350 MG/ML SOLN COMPARISON:  None Available. FINDINGS: Cardiovascular: No filling defects within the pulmonary arteries to suggest acute pulmonary embolism. No pericardial fluid. Mediastinum/Nodes: No axillary or supraclavicular adenopathy. No mediastinal or hilar adenopathy. No pericardial fluid.  Esophagus normal. Lungs/Pleura: Ill-defined ground-glass densities in the upper lobes. RIGHT pleural effusion. Small amount fluid along the RIGHT oblique fissure. RIGHT basilar atelectasis. No pulmonary infarction identified. No pneumonia. Mild interstitial thickening at the lung bases. Upper Abdomen: Enlargement of the LEFT adrenal gland to 25 mm mm. No change from comparison exam where adrenal gland demonstrated density consistent with benign adenoma. Findings consistent benign adenoma. Musculoskeletal:  No chest wall abnormality. No acute or significant osseous findings. Review of the MIP images confirms the above findings. IMPRESSION: 1. No evidence acute pulmonary embolism. 2. RIGHT pleural effusion with RIGHT basilar atelectasis. 3. Mild interstitial thickening at the lung bases suggest interstitial edema. 4. Ill-defined ground-glass densities in the upper lobes. Findings suggest mild pulmonary edema. 5. Benign LEFT adrenal adenoma. Electronically Signed   By: Deboraha Fallow M.D.   On: 01/05/2024 19:10   DG Chest Portable 1 View Result Date: 01/05/2024 CLINICAL DATA:  Shortness of breath EXAM: PORTABLE CHEST 1 VIEW COMPARISON:  None Available. FINDINGS: Some detail limited by body habitus. Lung bases are obscured by overlying soft tissue. Heart is enlarged. IMPRESSION: 1. Detail limited exam without definite plain film evidence of acute process. 2. Enlarged heart. Electronically Signed   By: Reagan Camera M.D.   On: 01/05/2024 16:33     Data Reviewed: Relevant notes from primary care and specialist visits, past discharge summaries as available in EHR, including Care Everywhere. Prior diagnostic testing as pertinent to current admission diagnoses Updated medications and problem lists for reconciliation ED course, including vitals, labs, imaging, treatment and response to treatment Triage notes, nursing and pharmacy notes and ED provider's notes Notable results as noted in HPI   Assessment and  Plan: * Atrial fibrillation with rapid ventricular response (HCC) Received IV metoprolol 5 mg x 2 while in the ED (diltiazem not given due to concern for CHF and NSTEMI) Start diltiazem infusion as patient still tachycardic to the 130s Continue heparin infusion Echocardiogram Cardiology consult  NSTEMI (non-ST elevated myocardial infarction) (HCC) CAD with history of PCI Possibly demand ischemia versus type I NSTEMI Patient had an episode of " heartburn" the day prior.  EKG nonacute Troponin [phone], will continue to trend Echo to evaluate for wall motion abnormality Continue heparin infusion Continue home atorvastatin 40 mg and enalapril.  Hold off beta-blocker to prevent bradycardia due to starting of diltiazem infusion Cardiology consult  Acute hypercapnic respiratory failure (HCC) Hypoxia Patient was hypoxic to 91% requiring 3 L Addendum: Following admission she became confused, pulling her lines off VBG resulted with pCO2 of 62 Respiratory consulted for BiPAP Continue close monitoring  Acute CHF (congestive heart failure) (HCC) Patient with dyspnea on exertion, BNP 1263, mild pulmonary edema on chest x-ray Mildly hypoxic to 91% in the ED requiring 4 L to maintain sats in the mid 90s Likely triggered by rapid A-fib, post NSTEMI  echocardiogram to evaluate left ventricular systolic function.  Last echo 08/2020 was normal Patient is not clinically fluid overloaded Low-dose IV Lasix  to evaluate for symptomatic relief Daily weights with intake and output monitoring Cardiology consult   Uncontrolled type 2 diabetes mellitus with hypoglycemia, with long-term current use of insulin (HCC) Hypoglycemic to 46 with EMS receiving glucagon, subsequently received D50 on arrival to the ED Patient noted to be on Humulin U-500 as well as Trulicity and metformin Hold insulin products and oral hypoglycemic agents CBG every hour until stabilized then start with sliding scale  coverage Will get A1c   PAD (peripheral artery disease) (HCC) History of right BKA History of left carotid endarterectomy History of lacunar infarct 2021 Continue atorvastatin.  Antiplatelets not seen on med list Continue heparin  Essential hypertension, benign BP controlled Continue home enalapril  Obesity, Class III, BMI 40-49.9 (morbid obesity) (HCC) Complicating factor to overall prognosis and care.  OSA on CPAP CPAP qhs    DVT prophylaxis: Heparin   Consults: Sallie Craze, [facility]  Advance Care Planning:   Code Status: Prior   Family Communication: none  Disposition Plan: Back to previous home environment  Severity of Illness: The appropriate patient status for this patient is INPATIENT. Inpatient status is judged to be reasonable and necessary in order to provide the required intensity of service to ensure the patient's safety. The patient's presenting symptoms, physical exam findings, and initial radiographic and laboratory data in the context of their chronic comorbidities is felt to place them at high risk for further clinical deterioration. Furthermore, it is not anticipated that the patient will be medically stable for discharge from the hospital within 2 midnights of admission.   * I certify that at the point of admission it is my clinical judgment that the patient will require inpatient hospital care spanning beyond 2 midnights from the point of admission due to high intensity of service, high risk for further deterioration and high frequency of surveillance required.*  CRITICAL CARE Performed by: Lanetta Pion   Total critical care time: 100  minutes  Critical care time was exclusive of separately billable procedures and treating other patients.  Critical care was necessary to treat or prevent imminent or life-threatening deterioration.  Critical care was time spent personally by me on the following activities: development of treatment plan with patient and/or  surrogate as well as nursing, discussions with consultants, evaluation of patient's response to treatment, examination of patient, obtaining history from patient or surrogate, ordering and performing treatments and interventions, ordering and review of laboratory studies, ordering and review of radiographic studies, pulse oximetry and re-evaluation of patient's condition.  Author: Lanetta Pion, MD 01/05/2024 9:25 PM  For on call review [email or web address].

## 2024-01-06 ENCOUNTER — Inpatient Hospital Stay
Admit: 2024-01-06 | Discharge: 2024-01-06 | Disposition: A | Discharge status: Home / Self Care | Attending: Internal Medicine | Admitting: Internal Medicine

## 2024-01-06 ENCOUNTER — Inpatient Hospital Stay

## 2024-01-06 DIAGNOSIS — N179 Acute kidney failure, unspecified: Secondary | ICD-10-CM

## 2024-01-06 DIAGNOSIS — I469 Cardiac arrest, cause unspecified: Secondary | ICD-10-CM | POA: Diagnosis not present

## 2024-01-06 DIAGNOSIS — I5031 Acute diastolic (congestive) heart failure: Secondary | ICD-10-CM

## 2024-01-06 DIAGNOSIS — J9602 Acute respiratory failure with hypercapnia: Secondary | ICD-10-CM

## 2024-01-06 DIAGNOSIS — I4891 Unspecified atrial fibrillation: Secondary | ICD-10-CM | POA: Diagnosis not present

## 2024-01-06 DIAGNOSIS — G253 Myoclonus: Secondary | ICD-10-CM | POA: Diagnosis not present

## 2024-01-06 DIAGNOSIS — I214 Non-ST elevation (NSTEMI) myocardial infarction: Secondary | ICD-10-CM

## 2024-01-06 DIAGNOSIS — G9341 Metabolic encephalopathy: Secondary | ICD-10-CM | POA: Diagnosis not present

## 2024-01-06 DIAGNOSIS — E119 Type 2 diabetes mellitus without complications: Secondary | ICD-10-CM

## 2024-01-06 LAB — BLOOD GAS, ARTERIAL
Acid-base deficit: 13.5 mmol/L — ABNORMAL HIGH (ref 0.0–2.0)
Bicarbonate: 16.8 mmol/L — ABNORMAL LOW (ref 20.0–28.0)
FIO2: 1 %
MECHVT: 450 mL
O2 Saturation: 97.3 %
PEEP: 5 cmH2O
Patient temperature: 37
RATE: 20 {breaths}/min
pCO2 arterial: 62 mmHg — ABNORMAL HIGH (ref 32–48)
pH, Arterial: 7.04 — CL (ref 7.35–7.45)
pO2, Arterial: 116 mmHg — ABNORMAL HIGH (ref 83–108)

## 2024-01-06 LAB — HEPARIN LEVEL (UNFRACTIONATED)
Heparin Unfractionated: <0.1 [IU]/mL — ABNORMAL LOW (ref 0.30–0.70)
Heparin Unfractionated: 0.15 [IU]/mL — ABNORMAL LOW (ref 0.30–0.70)
Heparin Unfractionated: <0.1 [IU]/mL — ABNORMAL LOW (ref 0.30–0.70)

## 2024-01-06 LAB — COMPREHENSIVE METABOLIC PANEL WITH GFR
ALT: 84 U/L — ABNORMAL HIGH (ref 0–44)
AST: 101 U/L — ABNORMAL HIGH (ref 15–41)
Albumin: 2.7 g/dL — ABNORMAL LOW (ref 3.5–5.0)
Alkaline Phosphatase: 99 U/L (ref 38–126)
Anion gap: 15 (ref 5–15)
BUN: 38 mg/dL — ABNORMAL HIGH (ref 8–23)
CO2: 19 mmol/L — ABNORMAL LOW (ref 22–32)
Calcium: 8.4 mg/dL — ABNORMAL LOW (ref 8.9–10.3)
Chloride: 103 mmol/L (ref 98–111)
Creatinine, Ser: 1.86 mg/dL — ABNORMAL HIGH (ref 0.44–1.00)
GFR, Estimated: 30 mL/min — ABNORMAL LOW (ref >60)
Glucose, Bld: 160 mg/dL — ABNORMAL HIGH (ref 70–99)
Potassium: 5 mmol/L (ref 3.5–5.1)
Sodium: 137 mmol/L (ref 135–145)
Total Bilirubin: 1 mg/dL (ref 0.0–1.2)
Total Protein: 6.2 g/dL — ABNORMAL LOW (ref 6.5–8.1)

## 2024-01-06 LAB — BLOOD GAS, VENOUS
Acid-base deficit: 3.1 mmol/L — ABNORMAL HIGH (ref 0.0–2.0)
Acid-base deficit: 3.8 mmol/L — ABNORMAL HIGH (ref 0.0–2.0)
Bicarbonate: 23.2 mmol/L (ref 20.0–28.0)
Bicarbonate: 24.4 mmol/L (ref 20.0–28.0)
FIO2: 60 %
MECHVT: 450 mL
O2 Saturation: 88.5 %
O2 Saturation: 76 %
PEEP: 10 cmH2O
Patient temperature: 37
Patient temperature: 37
RATE: 20 {breaths}/min
pCO2, Ven: 45 mmHg (ref 44–60)
pCO2, Ven: 57 mmHg (ref 44–60)
pH, Ven: 7.32 (ref 7.25–7.43)
pH, Ven: 7.24 — ABNORMAL LOW (ref 7.25–7.43)
pO2, Ven: 62 mmHg — ABNORMAL HIGH (ref 32–45)
pO2, Ven: 53 mmHg — ABNORMAL HIGH (ref 32–45)

## 2024-01-06 LAB — CBC
HCT: 32.9 % — ABNORMAL LOW (ref 36.0–46.0)
Hemoglobin: 8.9 g/dL — ABNORMAL LOW (ref 12.0–15.0)
MCH: 20.6 pg — ABNORMAL LOW (ref 26.0–34.0)
MCHC: 27.1 g/dL — ABNORMAL LOW (ref 30.0–36.0)
MCV: 76 fL — ABNORMAL LOW (ref 80.0–100.0)
Platelets: 417 10*3/uL — ABNORMAL HIGH (ref 150–400)
RBC: 4.33 MIL/uL (ref 3.87–5.11)
RDW: 17.3 % — ABNORMAL HIGH (ref 11.5–15.5)
WBC: 12.6 10*3/uL — ABNORMAL HIGH (ref 4.0–10.5)
nRBC: 0.2 % (ref 0.0–0.2)

## 2024-01-06 LAB — URINALYSIS, ROUTINE W REFLEX MICROSCOPIC
Bacteria, UA: NONE SEEN
Bilirubin Urine: NEGATIVE
Glucose, UA: 50 mg/dL — AB
Hgb urine dipstick: NEGATIVE
Ketones, ur: NEGATIVE mg/dL
Leukocytes,Ua: NEGATIVE
Nitrite: NEGATIVE
Protein, ur: >=300 mg/dL — AB
Specific Gravity, Urine: 1.031 — ABNORMAL HIGH (ref 1.005–1.030)
pH: 5 (ref 5.0–8.0)

## 2024-01-06 LAB — URINALYSIS, COMPLETE (UACMP) WITH MICROSCOPIC
Bacteria, UA: NONE SEEN
Bilirubin Urine: NEGATIVE
Glucose, UA: 50 mg/dL — AB
Ketones, ur: NEGATIVE mg/dL
Leukocytes,Ua: NEGATIVE
Nitrite: NEGATIVE
Protein, ur: >=300 mg/dL — AB
Specific Gravity, Urine: 1.029 (ref 1.005–1.030)
pH: 5 (ref 5.0–8.0)

## 2024-01-06 LAB — GLUCOSE, CAPILLARY
Glucose-Capillary: 140 mg/dL — ABNORMAL HIGH (ref 70–99)
Glucose-Capillary: 174 mg/dL — ABNORMAL HIGH (ref 70–99)
Glucose-Capillary: 206 mg/dL — ABNORMAL HIGH (ref 70–99)

## 2024-01-06 LAB — HIV ANTIBODY (ROUTINE TESTING W REFLEX): HIV Screen 4th Generation wRfx: NONREACTIVE

## 2024-01-06 LAB — LACTIC ACID, PLASMA: Lactic Acid, Venous: 4.9 mmol/L (ref 0.5–1.9)

## 2024-01-06 LAB — PHOSPHORUS: Phosphorus: 7 mg/dL — ABNORMAL HIGH (ref 2.5–4.6)

## 2024-01-06 LAB — T4, FREE: Free T4: 1.17 ng/dL — ABNORMAL HIGH (ref 0.61–1.12)

## 2024-01-06 LAB — ECHOCARDIOGRAM COMPLETE
Height: 65.5 in
Weight: 4160 [oz_av]

## 2024-01-06 LAB — TROPONIN I (HIGH SENSITIVITY)
Troponin I (High Sensitivity): 1827 ng/L (ref <18)
Troponin I (High Sensitivity): 1654 ng/L (ref <18)
Troponin I (High Sensitivity): 1467 ng/L (ref <18)

## 2024-01-06 LAB — CBG MONITORING, ED
Glucose-Capillary: 146 mg/dL — ABNORMAL HIGH (ref 70–99)
Glucose-Capillary: 99 mg/dL (ref 70–99)
Glucose-Capillary: 111 mg/dL — ABNORMAL HIGH (ref 70–99)
Glucose-Capillary: 155 mg/dL — ABNORMAL HIGH (ref 70–99)

## 2024-01-06 LAB — MAGNESIUM: Magnesium: 2.5 mg/dL — ABNORMAL HIGH (ref 1.7–2.4)

## 2024-01-06 LAB — BRAIN NATRIURETIC PEPTIDE: B Natriuretic Peptide: 750.8 pg/mL — ABNORMAL HIGH (ref 0.0–100.0)

## 2024-01-06 LAB — TSH: TSH: 2.618 u[IU]/mL (ref 0.350–4.500)

## 2024-01-06 LAB — MRSA NEXT GEN BY PCR, NASAL: MRSA by PCR Next Gen: NOT DETECTED

## 2024-01-06 MED ORDER — DOCUSATE SODIUM 50 MG/5ML PO LIQD
100.0000 mg | Freq: Two times a day (BID) | ORAL | Status: DC
  Administered 2024-01-06 – 2024-01-11 (×11): 100 mg
  Filled 2024-01-06 (×11): qty 10

## 2024-01-06 MED ORDER — AMIODARONE HCL IN DEXTROSE 360-4.14 MG/200ML-% IV SOLN
60.0000 mg/h | INTRAVENOUS | Status: DC
  Administered 2024-01-06: 60 mg/h via INTRAVENOUS
  Filled 2024-01-06: qty 200

## 2024-01-06 MED ORDER — MORPHINE SULFATE (PF) 2 MG/ML IV SOLN
1.0000 mg | Freq: Once | INTRAVENOUS | Status: AC
  Administered 2024-01-06: 1 mg via INTRAVENOUS
  Filled 2024-01-06: qty 1

## 2024-01-06 MED ORDER — HEPARIN (PORCINE) 25000 UT/250ML-% IV SOLN
1450.0000 [IU]/h | INTRAVENOUS | Status: DC
  Administered 2024-01-06 – 2024-01-11 (×8): 1650 [IU]/h via INTRAVENOUS
  Administered 2024-01-11: 1450 [IU]/h via INTRAVENOUS
  Filled 2024-01-06 (×9): qty 250

## 2024-01-06 MED ORDER — ORAL CARE MOUTH RINSE
15.0000 mL | OROMUCOSAL | Status: DC | PRN
  Administered 2024-01-06: 15 mL via OROMUCOSAL

## 2024-01-06 MED ORDER — PNEUMOCOCCAL 20-VAL CONJ VACC 0.5 ML IM SUSY: 0.5000 mL | PREFILLED_SYRINGE | INTRAMUSCULAR | Status: DC

## 2024-01-06 MED ORDER — IPRATROPIUM-ALBUTEROL 0.5-2.5 (3) MG/3ML IN SOLN
3.0000 mL | Freq: Four times a day (QID) | RESPIRATORY_TRACT | Status: DC | PRN
  Administered 2024-01-10: 3 mL via RESPIRATORY_TRACT
  Filled 2024-01-06: qty 3

## 2024-01-06 MED ORDER — HEPARIN BOLUS VIA INFUSION
2600.0000 [IU] | Freq: Once | INTRAVENOUS | Status: AC
  Administered 2024-01-06: 2600 [IU] via INTRAVENOUS
  Filled 2024-01-06: qty 2600

## 2024-01-06 MED ORDER — AMIODARONE HCL IN DEXTROSE 360-4.14 MG/200ML-% IV SOLN: 30.0000 mg/h | INTRAVENOUS | Status: DC

## 2024-01-06 MED ORDER — ORAL CARE MOUTH RINSE
15.0000 mL | OROMUCOSAL | Status: DC
  Administered 2024-01-06 – 2024-01-12 (×66): 15 mL via OROMUCOSAL

## 2024-01-06 MED ORDER — POLYETHYLENE GLYCOL 3350 17 G PO PACK
17.0000 g | PACK | Freq: Every day | ORAL | Status: DC
  Administered 2024-01-07 – 2024-01-11 (×5): 17 g
  Filled 2024-01-06 (×5): qty 1

## 2024-01-06 MED ORDER — INFLUENZA VIRUS VACC SPLIT PF (FLUZONE) 0.5 ML IM SUSY: 0.5000 mL | PREFILLED_SYRINGE | INTRAMUSCULAR | Status: DC

## 2024-01-06 MED ORDER — AMIODARONE LOAD VIA INFUSION
150.0000 mg | Freq: Once | INTRAVENOUS | Status: AC
  Administered 2024-01-06: 150 mg via INTRAVENOUS
  Filled 2024-01-06: qty 83.34

## 2024-01-06 MED ORDER — CHLORHEXIDINE GLUCONATE CLOTH 2 % EX PADS
6.0000 | MEDICATED_PAD | Freq: Every day | CUTANEOUS | Status: DC
  Administered 2024-01-06 – 2024-01-12 (×6): 6 via TOPICAL
  Filled 2024-01-06: qty 6

## 2024-01-06 MED ORDER — MIDAZOLAM HCL 2 MG/2ML IJ SOLN
2.0000 mg | INTRAMUSCULAR | Status: AC
  Administered 2024-01-06: 2 mg via INTRAVENOUS
  Filled 2024-01-06: qty 2

## 2024-01-06 MED ORDER — MAGNESIUM SULFATE 50 % IJ SOLN
INTRAMUSCULAR | Status: AC | PRN
  Administered 2024-01-06: 2 g via INTRAVENOUS

## 2024-01-06 MED ORDER — VASOPRESSIN 20 UNITS/100 ML INFUSION FOR SHOCK
0.0000 [IU]/min | INTRAVENOUS | Status: DC
Start: 2024-01-06 — End: 2024-01-06
  Filled 2024-01-06: qty 100

## 2024-01-06 MED ORDER — HEPARIN BOLUS VIA INFUSION
2500.0000 [IU] | Freq: Once | INTRAVENOUS | Status: AC
  Administered 2024-01-06: 2500 [IU] via INTRAVENOUS
  Filled 2024-01-06: qty 2500

## 2024-01-06 MED ORDER — LEVETIRACETAM IN NACL 500 MG/100ML IV SOLN
500.0000 mg | Freq: Two times a day (BID) | INTRAVENOUS | Status: DC
  Administered 2024-01-07 – 2024-01-08 (×3): 500 mg via INTRAVENOUS
  Filled 2024-01-06 (×4): qty 100

## 2024-01-06 MED ORDER — LEVETIRACETAM IN NACL 1000 MG/100ML IV SOLN
2000.0000 mg | Freq: Once | INTRAVENOUS | Status: AC
  Administered 2024-01-06: 2000 mg via INTRAVENOUS
  Filled 2024-01-06: qty 200

## 2024-01-06 MED ORDER — MELATONIN 5 MG PO TABS: 5.0000 mg | ORAL_TABLET | Freq: Every evening | ORAL | Status: DC | PRN

## 2024-01-06 MED ORDER — HEPARIN (PORCINE) 25000 UT/250ML-% IV SOLN
1650.0000 [IU]/h | INTRAVENOUS | Status: DC
  Filled 2024-01-06: qty 250

## 2024-01-06 MED ORDER — STERILE WATER FOR INJECTION IV SOLN
INTRAMUSCULAR | Status: DC
  Filled 2024-01-06 (×2): qty 150
  Filled 2024-01-06: qty 1000
  Filled 2024-01-06: qty 150

## 2024-01-06 MED ORDER — INSULIN ASPART 100 UNIT/ML IJ SOLN
0.0000 [IU] | INTRAMUSCULAR | Status: DC
  Administered 2024-01-06: 2 [IU] via SUBCUTANEOUS
  Administered 2024-01-06 – 2024-01-07 (×3): 3 [IU] via SUBCUTANEOUS
  Administered 2024-01-07: 5 [IU] via SUBCUTANEOUS
  Administered 2024-01-07: 3 [IU] via SUBCUTANEOUS
  Administered 2024-01-07: 5 [IU] via SUBCUTANEOUS
  Administered 2024-01-07: 3 [IU] via SUBCUTANEOUS
  Administered 2024-01-08: 5 [IU] via SUBCUTANEOUS
  Administered 2024-01-08: 8 [IU] via SUBCUTANEOUS
  Administered 2024-01-08 (×2): 5 [IU] via SUBCUTANEOUS
  Administered 2024-01-08 (×2): 8 [IU] via SUBCUTANEOUS
  Administered 2024-01-09: 3 [IU] via SUBCUTANEOUS
  Administered 2024-01-09 (×5): 5 [IU] via SUBCUTANEOUS
  Filled 2024-01-06 (×19): qty 1

## 2024-01-06 MED ORDER — EPINEPHRINE 1 MG/10ML IJ SOSY
PREFILLED_SYRINGE | INTRAMUSCULAR | Status: AC | PRN
  Administered 2024-01-06 (×4): 1 mg via INTRAVENOUS

## 2024-01-06 MED ORDER — SODIUM CHLORIDE 0.9 % IV SOLN: INTRAVENOUS | Status: AC | PRN

## 2024-01-06 NOTE — ED Notes (Signed)
 Pt placed  on Bipap by RT per order d/t confusion and VBG results. Charge made aware.

## 2024-01-06 NOTE — ED Notes (Addendum)
 This RN noticing on cardiac monitor pt HR 30-40s. HR quickly dropping to 20s. RN at bedside. Boyfriend entering the room same time as this Char[employer] fundraiser. Upon RN arrival to room pt unresponsive, foaming at the mouth, gray and no pulses palpable.  Boyfriend inquiring at the same time why was pt moved, what is going on and why is she foaming at the mouth. Boyfriend informed pt did not have a pulse. CODE BLUE button pulled and CPR initiated by this RN.

## 2024-01-06 NOTE — ED Provider Notes (Signed)
 [facility] Department of Emergency Medicine   Code Blue CONSULT NOTE  Chief Complaint: Cardiac arrest/unresponsive   Level V Caveat: Unresponsive  History of present illness: I was contacted by the hospital for a CODE BLUE cardiac arrest and presented to the patient's bedside.    ROS: Unable to obtain, Level V caveat  Scheduled Meds:  aspirin EC  81 mg Oral Daily   atorvastatin  40 mg Oral Daily   buPROPion  300 mg Oral Daily   DULoxetine  60 mg Oral Daily   enalapril  5 mg Oral Daily   furosemide  20 mg Intravenous Q12H   insulin aspart  0-15 Units Subcutaneous TID WC   insulin aspart  0-5 Units Subcutaneous QHS   OLANZapine  5 mg Oral Daily   pantoprazole  40 mg Oral Daily   traZODone  100 mg Oral QHS   Continuous Infusions:  amiodarone 60 mg/hr (01/06/24 1303)   Followed by   amiodarone     dextrose 40 mL/hr at 01/06/24 0022   heparin 1,650 Units/hr (01/06/24 1031)   PRN Meds:.acetaminophen, dextrose, EPINEPHrine, melatonin, nitroGLYCERIN, ondansetron (ZOFRAN) IV Past Medical History:  Diagnosis Date   Anemia    Anxiety    Arthritis    CHF (congestive heart failure) (HCC)    COPD (chronic obstructive pulmonary disease) (HCC)    Coronary artery disease    Diabetes mellitus without complication (HCC)    Fibromyalgia    GERD (gastroesophageal reflux disease)    Hypertension    Peripheral vascular disease (HCC)    Sleep apnea    Stroke [facility])    Past Surgical History:  Procedure Laterality Date   CESAREAN SECTION     COLONOSCOPY WITH PROPOFOL N/A 08/02/2022   Procedure: COLONOSCOPY WITH PROPOFOL;  Surgeon: Marnee Sink, MD;  Location: [facility] ENDOSCOPY;  Service: Endoscopy;  Laterality: N/A;   CORONARY ANGIOPLASTY WITH STENT PLACEMENT Left    DILATION AND CURETTAGE OF UTERUS     ENDARTERECTOMY Left 01/29/2015   Procedure: ENDARTERECTOMY CAROTID;  Surgeon: Celso College, MD;  Location: [facility] ORS;  Service: Vascular;  Laterality: Left;   LOWER  EXTREMITY ANGIOGRAPHY Left 09/24/2020   Procedure: Lower Extremity Angiography;  Surgeon: Celso College, MD;  Location: [facility] INVASIVE CV LAB;  Service: Cardiovascular;  Laterality: Left;   PERIPHERAL VASCULAR CATHETERIZATION Right 05/27/2015   Procedure: Lower Extremity Angiography;  Surgeon: Celso College, MD;  Location: [facility] INV[facility] CV LAB;  Service: Cardiovascular;  Laterality: Right;   PERIPHERAL VASCULAR CATHETERIZATION  05/27/2015   Procedure: Lower Extremity Intervention;  Surgeon: Celso College, MD;  Location: [facility] INVASIVE CV LAB;  Service: Cardiovascular;;   PERIPHERAL VASCULAR CATHETERIZATION Right 09/22/2016   Procedure: Lower Extremity Angiography;  Surgeon: Celso College, MD;  Location: [facility] INVASIVE CV LAB;  Service: Cardiovascular;  Laterality: Right;   TUBAL LIGATION     Social History   Socioeconomic History   Marital status: Divorced    Spouse name: Not on file   Number of children: Not on file   Years of education: Not on file   Highest education level: Not on file  Occupational History   Not on file  Tobacco Use   Smoking status: Former    Current packs/day: 0.00    Average packs/day: 0.3 packs/day for 38.0 years (9.5 ttl pk-yrs)    Types: Cigarettes    Start date: 09/12/1978    Quit date: 09/12/2016    Years since quitting: 7.3   Smokeless  tobacco: Current  Substance and Sexual Activity   Alcohol use: No   Drug use: No   Sexual activity: Not on file  Other Topics Concern   Not on file  Social History Narrative   Not on file   Social Drivers of Health   Financial Resource Strain: Not on file  Food Insecurity: Not on file  Transportation Needs: Not on file  Physical Activity: Not on file  Stress: Not on file  Social Connections: Not on file  Intimate Partner Violence: Not on file   Allergies  Allergen Reactions   Niacin And Related Nausea And Vomiting    Last set of Vital Signs (not current) Vitals:   01/06/24 1131 01/06/24 1145  BP: 121/75 124/89   Pulse: 83 81  Resp:  (!) 22  Temp:    SpO2: 100% 100%      Physical Exam  Gen: unresponsive Cardiovascular: pulseless  Resp: apneic. Breath sounds equal bilaterally with bagging  Abd: nondistended  Neuro: GCS 3, unresponsive to pain  HEENT: No blood in posterior pharynx, gag reflex absent   Skin: warm  Procedures (when applicable, including Critical Care time): Procedure Name: Intubation Date/Time: 01/06/2024 1:34 PM  Performed by: Bryson Carbine, MDPre-anesthesia Checklist: Patient identified Oxygen Delivery Method: Ambu bag Preoxygenation: Pre-oxygenation with 100% oxygen Laryngoscope Size: 4 Grade View: Grade III Tube size: 7.5 mm Number of attempts: 1 Airway Equipment and Method: Patient positioned with wedge pillow Placement Confirmation: ETT inserted through vocal cords under direct vision and CO2 detector Secured at: 25 cm Difficulty Due To: Difficulty was anticipated Future Recommendations: Recommend- induction with short-acting agent, and alternative techniques readily available       MDM / Assessment and Plan Nurse reports patient bradycardia down and arrested rapidly, quick response by staff started ACLS, asystole initial rhythm and after 1 round of epi.  I intubated the patient  Dr. Meyer Ada of the hospitalist team took over ACLS   Bryson Carbine, MD 01/06/24 1334

## 2024-01-06 NOTE — ED Notes (Signed)
 Pt found to be pulseless at this time. CPR started.

## 2024-01-06 NOTE — Plan of Care (Signed)
  Problem: Education: Goal: Ability to describe self-care measures that may prevent or decrease complications (Diabetes Survival Skills Education) will improve Outcome: Not Progressing Goal: Individualized Educational Video(s) Outcome: Not Progressing   Problem: Coping: Goal: Ability to adjust to condition or change in health will improve Outcome: Not Progressing   Problem: Fluid Volume: Goal: Ability to maintain a balanced intake and output will improve Outcome: Not Progressing   Problem: Health Behavior/Discharge Planning: Goal: Ability to identify and utilize available resources and services will improve Outcome: Not Progressing Goal: Ability to manage health-related needs will improve Outcome: Not Progressing   Problem: Metabolic: Goal: Ability to maintain appropriate glucose levels will improve Outcome: Not Progressing   Problem: Nutritional: Goal: Maintenance of adequate nutrition will improve Outcome: Not Progressing Goal: Progress toward achieving an optimal weight will improve Outcome: Not Progressing   Problem: Skin Integrity: Goal: Risk for impaired skin integrity will decrease Outcome: Not Progressing   Problem: Tissue Perfusion: Goal: Adequacy of tissue perfusion will improve Outcome: Not Progressing   Problem: Education: Goal: Understanding of cardiac disease, CV risk reduction, and recovery process will improve Outcome: Not Progressing Goal: Individualized Educational Video(s) Outcome: Not Progressing   Problem: Activity: Goal: Ability to tolerate increased activity will improve Outcome: Not Progressing   Problem: Cardiac: Goal: Ability to achieve and maintain adequate cardiovascular perfusion will improve Outcome: Not Progressing   Problem: Health Behavior/Discharge Planning: Goal: Ability to safely manage health-related needs after discharge will improve Outcome: Not Progressing   Problem: Education: Goal: Knowledge of disease or condition  will improve Outcome: Not Progressing Goal: Understanding of medication regimen will improve Outcome: Not Progressing Goal: Individualized Educational Video(s) Outcome: Not Progressing   Problem: Activity: Goal: Ability to tolerate increased activity will improve Outcome: Not Progressing   Problem: Cardiac: Goal: Ability to achieve and maintain adequate cardiopulmonary perfusion will improve Outcome: Not Progressing   Problem: Health Behavior/Discharge Planning: Goal: Ability to safely manage health-related needs after discharge will improve Outcome: Not Progressing   Problem: Education: Goal: Knowledge of General Education information will improve Description: Including pain rating scale, medication(s)/side effects and non-pharmacologic comfort measures Outcome: Not Progressing   Problem: Health Behavior/Discharge Planning: Goal: Ability to manage health-related needs will improve Outcome: Not Progressing   Problem: Clinical Measurements: Goal: Ability to maintain clinical measurements within normal limits will improve Outcome: Not Progressing Goal: Will remain free from infection Outcome: Not Progressing Goal: Diagnostic test results will improve Outcome: Not Progressing Goal: Respiratory complications will improve Outcome: Not Progressing Goal: Cardiovascular complication will be avoided Outcome: Not Progressing   Problem: Activity: Goal: Risk for activity intolerance will decrease Outcome: Not Progressing   Problem: Nutrition: Goal: Adequate nutrition will be maintained Outcome: Not Progressing   Problem: Coping: Goal: Level of anxiety will decrease Outcome: Not Progressing   Problem: Elimination: Goal: Will not experience complications related to bowel motility Outcome: Not Progressing Goal: Will not experience complications related to urinary retention Outcome: Not Progressing   Problem: Pain Managment: Goal: General experience of comfort will improve  and/or be controlled Outcome: Not Progressing   Problem: Safety: Goal: Ability to remain free from injury will improve Outcome: Not Progressing   Problem: Skin Integrity: Goal: Risk for impaired skin integrity will decrease Outcome: Not Progressing

## 2024-01-06 NOTE — Consult Note (Signed)
 PHARMACY - ANTICOAGULATION CONSULT NOTE  Pharmacy Consult for IV Heparin Indication: chest pain/ACS  Patient Measurements: Height: 5' 5.5" (166.4 cm) Weight: 117.9 kg (260 lb) IBW/kg (Calculated) : 58.15 HEPARIN DW (KG): 86.3  Labs: Recent Labs    01/05/24 1547 01/05/24 1832 01/05/24 1839 01/05/24 2256  HGB 9.7*  --   --   --   HCT 35.5*  --   --   --   PLT 450*  --   --   --   APTT  --   --  87*  --   LABPROT  --   --  17.7*  --   INR  --   --  1.4*  --   HEPARINUNFRC  --   --   --  <0.10*  CREATININE 1.21*  --   --   --   TROPONINIHS 1,367* 1,686*  --  1,827*    Estimated Creatinine Clearance: 61.7 mL/min (A) (by C-G formula based on SCr of 1.21 mg/dL (H)).   Medical History: Past Medical History:  Diagnosis Date   Anemia    Anxiety    Arthritis    CHF (congestive heart failure) (HCC)    COPD (chronic obstructive pulmonary disease) (HCC)    Coronary artery disease    Diabetes mellitus without complication (HCC)    Fibromyalgia    GERD (gastroesophageal reflux disease)    Hypertension    Peripheral vascular disease (HCC)    Sleep apnea    Stroke (HCC)     Medications:  No anticoagulation prior to admission per my chart review. Medication reconciliation is pending  Assessment: 63 y/o F with medical history as above presenting to the ED 4/11 with weakness, SOB. Troponin elevated. Pharmacy consulted to manage heparin infusion for ACS.  Baseline aPTT and INR are pending. Baseline CBC notable for anemia which appears c/w baseline.  Goal of Therapy:  Heparin level 0.3-0.7 units/ml Monitor platelets by anticoagulation protocol: Yes   Plan:  4/11:  HL @ 2256 = < 0.1, SUBtherapeutic - RN stated pt pulled IV out but heparin gtt could not have been off for more than 20 - 30 min. Unlikely this would account for HL being this low.  Will adjust heparin based on this value - Will order heparin 2600 units IV X 1 bolus and increase drip rate to 1400 units/hr - Will  recheck HL 6 hrs after rate change  --Daily CBC per protocol while on IV heparin  Sharee Sturdy D 01/06/2024,12:45 AM

## 2024-01-06 NOTE — Consult Note (Signed)
 NAME:  [name], MRN:  [id number], DOB:  01/26/1961, LOS: 1 ADMISSION DATE:  01/05/2024, CONSULTATION DATE: 01/06/2024 REFERRING MD: Dr. Meyer Ada, CHIEF COMPLAINT: Cardiac Arrest    History of Present Illness:  This is a 63 yo female who presented to [facility] ER on 04/11 via EMS from home with c/o weakness and shortness of breath onset 3 days prior to presentation.   At baseline she wears CPAP at night, but is not on chronic O2.  Per EMS pt found to be in atrial fibrillation with rvr.  She does not have a hx of atrial fibrillation.  She was also hypoglycemic CBG 46, EMS administered glucagon.  However, she remained hypoglycemic and received 1 amp of D50.  CBG stabilized.    ED Course  Upon arrival to the ER initial EKG revealed atrial fibrillation with right bundle branch block, hr 139 but no acute ST elevation or depression present.  She received 1L iv fluid bolus.  Significant lab results were: glucose 58/BUN 27/creatinine 1.21/albumin 3.1/BNP 1,263.5/troponin 1,367/wbc 11.9/hgb 9.7/platelet count 450.  CTA Chest negative for PE, but concerning for interstitial edema.  She received 5 mg of iv metoprolol x2 with no significant improvement in heart rate.  Heparin bolus followed by heparin gtt administered for possible NSTEMI.  She was subsequently admitted to the progressive care unit per hospitalist team for additional workup and treatment, however she remained in the ER pending bed availability.  See detailed hospital course below under significant events.   CTA Chest PE:  No evidence acute pulmonary embolism. RIGHT pleural effusion with RIGHT basilar atelectasis. Mild interstitial thickening at the lung bases suggest interstitial edema. Ill-defined ground-glass densities in the upper lobes. Findings suggest mild pulmonary edema. Benign LEFT adrenal adenoma.  CXR: Detail limited exam without definite plain film evidence of acute process. Enlarged heart.  Pertinent  Medical History  Anemia  Anxiety   Arthritis  COPD CAD Type II Diabetes Mellitus  Fibromyalgia  GERD  HTN  PVD OSA Stroke  CHF   Significant Hospital Events: Including procedures, antibiotic start and stop dates in addition to other pertinent events   04/11: Pt admitted to the progressive care unit with new onset atrial fibrillation with rvr, acute respiratory failure secondary to acute CHF exacerbation, and NSTEMI.   04/12: Pt remained in atrial fibrillation with rvr amiodarone bolus administered followed by amiodarone gtt at 60 mg/hr.  Following administration she became bradycardic and pulseless ACLS protocol initiated with ROSC 13 minutes following initiation of ACLS protocol.  Pt mechanically intubated during cardiac arrest.  Post cardiac arrest pt comatose, unable to follow commands, with no gag/oculocephalic reflexes present despite NOT receiving RSI medications and intermittent eyelid myoclonus present neurology consulted and cerebell initiated  Interim History / Subjective:  As outlined above under significant events   Objective   Blood pressure 100/81, pulse (!) 117, temperature (!) 92.5 F (33.6 C), resp. rate (!) 22, height 5' 5.5" (1.664 m), weight 117.9 kg, SpO2 100%.    FiO2 (%):  [40 %] 40 %   Intake/Output Summary (Last 24 hours) at 01/06/2024 1351 Last data filed at 01/05/2024 2001 Gross per 24 hour  Intake --  Output 800 ml  Net -800 ml   Filed Weights   01/05/24 1540  Weight: 117.9 kg   Examination: General: Acutely-ill appearing obese female, mild distress mechanically intubated  HENT: Supple, unable to assess for JVD due to body habitus  Lungs: Faint rhonchi throughout, even, non labored  Cardiovascular: Sinus bradycardia,  s1s2, no m/r/g, 2+ radial/1+ left distal pulse, 1+ right femoral pulse, no edema  Abdomen: +BS x4, obese, soft, non distended  Extremities: Right AKA  Neuro: Unresponsive, not following commands or withdrawing from stimulation, no gag/oculocephalic reflexes,  intermittent eyelid myoclonus present, corneal and cough reflex present  GU: Indwelling foley catheter present draining yellow urine   Resolved Hospital Problem list     Assessment & Plan:   #Acute metabolic encephalopathy  #Intermittent eyelid myoclonus concerning for 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 Severe chronic small vessel ischemic disease - Correct metabolic derangements  - Maintain RASS goal of 0 to -1 - Avoid sedating medications as able  - 2g load of iv keppra x1 dose followed by scheduled keppra 500 mg q12rs for myoclonus  - Cerebell  - EEG 04/14 - Neurology consulted appreciate input: recommending deferring MRI Brain until 72 hrs post cardiac arrest  - Seizure precautions  - Normothermia protocol  - WUA daily   #Cardiac arrest suspect secondary to amiodarone (asystole) #NSTEMI  #New-onset atrial fibrillation with rvr  #Acute CHF exacerbation  Hx: HTN, PVD, stroke, and CAD  Echo 01/06/2024: EF 35 to 40%; grade II diastolic dysfunction  - Continuous telemetry monitoring  - Will hold antiarrhythmics for now  - Continue heparin gtt: dosing per pharmacy  - Diurese as bp and renal function permits  - Hold antihypertensives for now  - Maintain map 65 or higher  - Continue aspirin and atorvastatin  - Cardiology consulted appreciate input  - Troponin peaked at 1,827 - Free T3/Free T4 pending  #Acute hypoxic respiratory failure  #Mechanical intubation  Hx: COPD and OSA  - Full vent support for now: vent settings reviewed and established  - Continue lung protective strategies  - Maintain plateau pressures less than 30 cm H2O - VAP bundle implemented - Intermittent CXR's and ABG's - Prn bronchodilator therapy   #Acute kidney injury secondary to ATN  #Severe metabolic acidosis  - Trend BMP and vbg  - Replace electrolytes as indicated  - Strict I&O's - Avoid nephrotoxic  agents   #Transamanitis  - Trend hepatic function panel  - Avoid hepatotoxic agents as able   #Anemia without obvious signs of bleeding  - Trend CBC  - Monitor for s/sx of bleeding  - Transfuse for hgb <7   #Type II diabetes mellitus  #Hypoglycemia~resolved  - CBG's q4hrs  - SSI  - Follow hypo/hyperglycemic protocol  - Target CBG range 140 to 180  Best Practice (right click and "Reselect all SmartList Selections" daily)   Diet/type: NPO; dietitian consulted for TF's  DVT prophylaxis systemic heparin Pressure ulcer(s): N/A GI prophylaxis: PPI Lines: N/A Foley:  Yes, and it is still needed Code Status:  full code Last date of multidisciplinary goals of care discussion [N/A]  04/12: Updated pts daughter and significant other in the ER family room regarding pts conditon and current plan of care.  Informed family pt is HIGH risk to cardiac arrest again and sudden death.  I also discussed pts code status.  At this time per family request pt to remain a FULL CODE Labs   CBC: Recent Labs  Lab 01/05/24 1547 01/06/24 0431  WBC 11.9* 12.6*  NEUTROABS 7.9*  --   HGB 9.7* 8.9*  HCT 35.5* 32.9*  MCV 77.9* 76.0*  PLT 450* 417*    Basic Metabolic Panel: Recent Labs  Lab 01/05/24 1547  NA 141  K 3.7  CL 105  CO2 26  GLUCOSE 58*  BUN 27*  CREATININE 1.21*  CALCIUM 9.2   GFR: Estimated Creatinine Clearance: 61.7 mL/min (A) (by C-G formula based on SCr of 1.21 mg/dL (H)). Recent Labs  Lab 01/05/24 1547 01/06/24 0431  WBC 11.9* 12.6*    Liver Function Tests: Recent Labs  Lab 01/05/24 1547  AST 24  ALT 26  ALKPHOS 95  BILITOT 0.6  PROT 7.2  ALBUMIN 3.1*   Recent Labs  Lab 01/05/24 1547  LIPASE 21   No results for input(s): "AMMONIA" in the last 168 hours.  ABG    Component Value Date/Time   HCO3 23.2 01/06/2024 0500   ACIDBASEDEF 3.1 (H) 01/06/2024 0500   O2SAT 88.5 01/06/2024 0500     Coagulation Profile: Recent Labs  Lab 01/05/24 1839  INR  1.4*    Cardiac Enzymes: No results for input(s): "CKTOTAL", "CKMB", "CKMBINDEX", "TROPONINI" in the last 168 hours.  HbA1C: Hgb A1c MFr Bld  Date/Time Value Ref Range Status  09/23/2020 07:10 AM 8.6 (H) 4.8 - 5.6 % Final    Comment:    (NOTE) Pre diabetes:          5.7%-6.4%  Diabetes:              >6.4%  Glycemic control for   <7.0% adults with diabetes     CBG: Recent Labs  Lab 01/05/24 2242 01/05/24 2338 01/06/24 0209 01/06/24 0415 01/06/24 0759  GLUCAP 50* 82 146* 99 111*    Review of Systems:   Unable to assess pt mechanically intubated   Past Medical History:  She,  has a past medical history of Anemia, Anxiety, Arthritis, CHF (congestive heart failure) (HCC), COPD (chronic obstructive pulmonary disease) (HCC), Coronary artery disease, Diabetes mellitus without complication (HCC), Fibromyalgia, GERD (gastroesophageal reflux disease), Hypertension, Peripheral vascular disease (HCC), Sleep apnea, and Stroke (HCC).   Surgical History:   Past Surgical History:  Procedure Laterality Date   CESAREAN SECTION     COLONOSCOPY WITH PROPOFOL N/A 08/02/2022   Procedure: COLONOSCOPY WITH PROPOFOL;  Surgeon: Marnee Sink, MD;  Location: [facility] ENDOSCOPY;  Service: Endoscopy;  Laterality: N/A;   CORONARY ANGIOPLASTY WITH STENT PLACEMENT Left    DILATION AND CURETTAGE OF UTERUS     ENDARTERECTOMY Left 01/29/2015   Procedure: ENDARTERECTOMY CAROTID;  Surgeon: Celso College, MD;  Location: [facility] ORS;  Service: Vascular;  Laterality: Left;   LOWER EXTREMITY ANGIOGRAPHY Left 09/24/2020   Procedure: Lower Extremity Angiography;  Surgeon: Celso College, MD;  Location: [facility] INVASIVE CV LAB;  Service: Cardiovascular;  Laterality: Left;   PERIPHERAL VASCULAR CATHETERIZATION Right 05/27/2015   Procedure: Lower Extremity Angiography;  Surgeon: Celso College, MD;  Location: [facility] INVASIVE CV LAB;  Service: Cardiovascular;  Laterality: Right;   PERIPHERAL VASCULAR CATHETERIZATION  05/27/2015    Procedure: Lower Extremity Intervention;  Surgeon: Celso College, MD;  Location: [facility] INVASIVE CV LAB;  Service: Cardiovascular;;   PERIPHERAL VASCULAR CATHETERIZATION Right 09/22/2016   Procedure: Lower Extremity Angiography;  Surgeon: Celso College, MD;  Location: [facility] INVASIVE CV LAB;  Service: Cardiovascular;  Laterality: Right;   TUBAL LIGATION       Social History:   reports that she quit smoking about 7 years ago. Her smoking use included cigarettes. She started smoking about 45 years ago. She has a 9.5 pack-year smoking history. She uses smokeless tobacco. She reports that she does not drink alcohol and does not use drugs.   Family  History:  Her family history is not on file.   Allergies Allergies  Allergen Reactions   Niacin And Related Nausea And Vomiting     Home Medications  Prior to Admission medications   Medication Sig Start Date End Date Taking? Authorizing Provider  atorvastatin (LIPITOR) 40 MG tablet Take 40 mg by mouth every evening.   Yes [provider]  buPROPion (WELLBUTRIN XL) 300 MG 24 hr tablet Take 300 mg by mouth daily. Pt taking 300 mg by mouth daily   Yes [provider]  cetirizine (ZYRTEC) 10 MG tablet Take 10 mg by mouth daily.   Yes [provider]  DULoxetine (CYMBALTA) 60 MG capsule Take 60 mg by mouth daily.   Yes [provider]  enalapril (VASOTEC) 5 MG tablet Take 5 mg by mouth daily.   Yes [provider]  HUMULIN R U-500 KWIKPEN 500 UNIT/ML kwikpen Inject 40-80 Units into the skin See admin instructions. Inject 80u under the skin every morning before breakfast and inject 40u under the skin every evening before supper   Yes [provider]  metFORMIN (GLUCOPHAGE-XR) 500 MG 24 hr tablet Take 500 mg by mouth 2 (two) times daily with a meal.   Yes [provider]  OLANZapine (ZYPREXA) 5 MG tablet Take 5 mg by mouth daily. 02/11/21  Yes [provider]  oxybutynin (DITROPAN-XL) 10 MG  24 hr tablet Take 10 mg by mouth daily.   Yes [provider]  pantoprazole (PROTONIX) 40 MG tablet Take 40 mg by mouth daily.   Yes [provider]  traZODone (DESYREL) 50 MG tablet Take 100 mg by mouth at bedtime.   Yes [provider]  collagenase (SANTYL) ointment Apply topically daily. Patient not taking: Reported on 01/05/2024 09/26/20   Alphonsus Jeans, MD  Dulaglutide (TRULICITY) 0.75 MG/0.5ML SOPN Inject 0.75 mg into the skin every Tuesday. Patient not taking: Reported on 06/24/2022    [provider]  gabapentin (NEURONTIN) 300 MG capsule Take 300-600 mg by mouth at bedtime. Patient not taking: Reported on 01/05/2024    [provider]     Critical care time: 70 minutes       Janey Meek, AGNP  Pulmonary/Critical Care Pager [phone] (please enter 7 digits) PCCM Consult Pager [phone] (please enter 7 digits)

## 2024-01-06 NOTE — Assessment & Plan Note (Signed)
 Hypoxia Patient was hypoxic to 91% requiring 3 L Addendum: Following admission she became confused, pulling her lines off VBG resulted with pCO2 of 62 Respiratory consulted for BiPAP Continue close monitoring

## 2024-01-06 NOTE — Progress Notes (Signed)
  Echocardiogram 2D Echocardiogram has been performed.  [name] 01/06/2024, 12:36 PM

## 2024-01-06 NOTE — Hospital Course (Signed)
 Marland Kitchen

## 2024-01-06 NOTE — ED Notes (Signed)
 Pt has pulled out 3 Ivs tonight. Pt restless- moves a lot in bed and scoots to the bottom of the bed. Pt constantly pulling tele leads off and wont keep nasal cannula on. Pt seems to be pulling things off impulsively. Pt pleasant and cooperative otherwise.

## 2024-01-06 NOTE — Progress Notes (Addendum)
 CODE BLUE was called at 1323.  I responded and per RN patient developed bradycardia with heart rate in the 30s and then 20s and then arrested.  Patient was admitted overnight for Non-ST elevation MI (history of coronary artery disease status post PCI), acute hypoxic respiratory failure requiring oxygen supplementation, acute CHF presumed diastolic as well as new onset rapid A-fib.  She had been seen earlier on rounds and her main complaint was shortness of breath and occasional heartburn but she denied having any chest pain, no nausea, no vomiting or diaphoresis.  She was on a heparin drip as well as IV Cardizem.  She was also on IV furosemide for CHF. ACLS protocol was initiated by ER staff.  Initial rhythm was asystole.  Patient received 4 doses of epi and achieved ROSC at 1336.  She also received a dose of magnesium 2 g. She was intubated by ER physician and has not required any sedation. Met with patient's significant other in the family room and informed him of the patient's change in condition and that she had achieved ROSC but her overall condition was critical at this point.  All questions and concerns were addressed to the best of my ability. Critical care team was consulted and patient's care will be transferred over to [facility]  Total critical care time - 60 minutes

## 2024-01-06 NOTE — Consult Note (Signed)
 [name] is a 63 y.o. female  [id number]  Primary Cardiologist: [facility] cardiology Reason for Consultation: Atrial fibrillation and non-STEMI  HPI: This is a 63 year old white female with a past medical history of coronary artery disease status post PCI, status post right BKA and left carotid endarterectomy and history of CVA 2021, presented with chest pain shortness of breath and palpitation.  Patient states that she was having burning sensation in her chest associated with severe shortness of breath and palpitation.  She presented to the hospital and was found to be in atrial fibrillation with rapid ventricular response rate.  She had a CTA chest which revealed no evidence of pulmonary embolism but there was mild interstitial thickening and mild pulmonary edema.  I was asked to evaluate the patient because of new onset atrial fibrillation.   Review of Systems: Patient does have orthopnea PND but no leg swelling   Past Medical History:  Diagnosis Date   Anemia    Anxiety    Arthritis    CHF (congestive heart failure) (HCC)    COPD (chronic obstructive pulmonary disease) (HCC)    Coronary artery disease    Diabetes mellitus without complication (HCC)    Fibromyalgia    GERD (gastroesophageal reflux disease)    Hypertension    Peripheral vascular disease (HCC)    Sleep apnea    Stroke (HCC)     (Not in a hospital admission)     aspirin EC  81 mg Oral Daily   atorvastatin  40 mg Oral Daily   buPROPion  300 mg Oral Daily   DULoxetine  60 mg Oral Daily   enalapril  5 mg Oral Daily   furosemide  20 mg Intravenous Q12H   insulin aspart  0-15 Units Subcutaneous TID WC   insulin aspart  0-5 Units Subcutaneous QHS   OLANZapine  5 mg Oral Daily   pantoprazole  40 mg Oral Daily   traZODone  100 mg Oral QHS    Infusions:  dextrose 40 mL/hr at 01/06/24 0022   dilTIAZem HCl-Dextrose 7.5 mg/hr (01/06/24 0417)   heparin 1,650 Units/hr (01/06/24 1031)    Allergies  Allergen  Reactions   Niacin And Related Nausea And Vomiting    Social History   Socioeconomic History   Marital status: Divorced    Spouse name: Not on file   Number of children: Not on file   Years of education: Not on file   Highest education level: Not on file  Occupational History   Not on file  Tobacco Use   Smoking status: Former    Current packs/day: 0.00    Average packs/day: 0.3 packs/day for 38.0 years (9.5 ttl pk-yrs)    Types: Cigarettes    Start date: 09/12/1978    Quit date: 09/12/2016    Years since quitting: 7.3   Smokeless tobacco: Current  Substance and Sexual Activity   Alcohol use: No   Drug use: No   Sexual activity: Not on file  Other Topics Concern   Not on file  Social History Narrative   Not on file   Social Drivers of Health   Financial Resource Strain: Not on file  Food Insecurity: Not on file  Transportation Needs: Not on file  Physical Activity: Not on file  Stress: Not on file  Social Connections: Not on file  Intimate Partner Violence: Not on file    History reviewed. No pertinent family history.  PHYSICAL EXAM: Vitals:   01/06/24 1000  01/06/24 1100  BP: 115/75 98/85  Pulse: 79 79  Resp:    Temp:    SpO2: 100% 100%     Intake/Output Summary (Last 24 hours) at 01/06/2024 1216 Last data filed at 01/05/2024 2001 Gross per 24 hour  Intake --  Output 800 ml  Net -800 ml    General:  Well appearing. No respiratory difficulty HEENT: normal Neck: supple. no JVD. Carotids 2+ bilat; no bruits. No lymphadenopathy or thryomegaly appreciated. Cor: PMI nondisplaced. Regular rate & rhythm. No rubs, gallops or murmurs. Lungs: clear Abdomen: soft, nontender, nondistended. No hepatosplenomegaly. No bruits or masses. Good bowel sounds. Extremities: no cyanosis, clubbing, rash, edema Neuro: alert & oriented x 3, cranial nerves grossly intact. moves all 4 extremities w/o difficulty. Affect pleasant.  ECG: Atrial fibrillation with ventricular  rate 139 with right bundle branch block and nonspecific ST-T changes  Results for orders placed or performed during the hospital encounter of 01/05/24 (from the past 24 hours)  CBG monitoring, ED     Status: Abnormal   Collection Time: 01/05/24  3:41 PM  Result Value Ref Range   Glucose-Capillary 39 (LL) 70 - 99 mg/dL   Comment 1 Notify RN   Resp panel by RT-PCR (RSV, Flu A&B, Covid) Anterior Nasal Swab     Status: None   Collection Time: 01/05/24  3:46 PM   Specimen: Anterior Nasal Swab  Result Value Ref Range   SARS Coronavirus 2 by RT PCR NEGATIVE NEGATIVE   Influenza A by PCR NEGATIVE NEGATIVE   Influenza B by PCR NEGATIVE NEGATIVE   Resp Syncytial Virus by PCR NEGATIVE NEGATIVE  CBC with Differential     Status: Abnormal   Collection Time: 01/05/24  3:47 PM  Result Value Ref Range   WBC 11.9 (H) 4.0 - 10.5 K/uL   RBC 4.56 3.87 - 5.11 MIL/uL   Hemoglobin 9.7 (L) 12.0 - 15.0 g/dL   HCT 16.1 (L) 09.6 - 04.5 %   MCV 77.9 (L) 80.0 - 100.0 fL   MCH 21.3 (L) 26.0 - 34.0 pg   MCHC 27.3 (L) 30.0 - 36.0 g/dL   RDW 40.9 (H) 81.1 - 91.4 %   Platelets 450 (H) 150 - 400 K/uL   nRBC 0.3 (H) 0.0 - 0.2 %   Neutrophils Relative % 66 %   Neutro Abs 7.9 (H) 1.7 - 7.7 K/uL   Lymphocytes Relative 19 %   Lymphs Abs 2.2 0.7 - 4.0 K/uL   Monocytes Relative 13 %   Monocytes Absolute 1.5 (H) 0.1 - 1.0 K/uL   Eosinophils Relative 1 %   Eosinophils Absolute 0.1 0.0 - 0.5 K/uL   Basophils Relative 1 %   Basophils Absolute 0.1 0.0 - 0.1 K/uL   Immature Granulocytes 0 %   Abs Immature Granulocytes 0.04 0.00 - 0.07 K/uL  Comprehensive metabolic panel     Status: Abnormal   Collection Time: 01/05/24  3:47 PM  Result Value Ref Range   Sodium 141 135 - 145 mmol/L   Potassium 3.7 3.5 - 5.1 mmol/L   Chloride 105 98 - 111 mmol/L   CO2 26 22 - 32 mmol/L   Glucose, Bld 58 (L) 70 - 99 mg/dL   BUN 27 (H) 8 - 23 mg/dL   Creatinine, Ser 7.82 (H) 0.44 - 1.00 mg/dL   Calcium 9.2 8.9 - 95.6 mg/dL   Total  Protein 7.2 6.5 - 8.1 g/dL   Albumin 3.1 (L) 3.5 - 5.0 g/dL   AST 24  15 - 41 U/L   ALT 26 0 - 44 U/L   Alkaline Phosphatase 95 38 - 126 U/L   Total Bilirubin 0.6 0.0 - 1.2 mg/dL   GFR, Estimated 50 (L) >60 mL/min   Anion gap 10 5 - 15  Lipase, blood     Status: None   Collection Time: 01/05/24  3:47 PM  Result Value Ref Range   Lipase 21 11 - 51 U/L  Brain natriuretic peptide     Status: Abnormal   Collection Time: 01/05/24  3:47 PM  Result Value Ref Range   B Natriuretic Peptide 1,263.5 (H) 0.0 - 100.0 pg/mL  Troponin I (High Sensitivity)     Status: Abnormal   Collection Time: 01/05/24  3:47 PM  Result Value Ref Range   Troponin I (High Sensitivity) 1,367 (HH) <18 ng/L  CBG monitoring, ED     Status: Abnormal   Collection Time: 01/05/24  4:13 PM  Result Value Ref Range   Glucose-Capillary 100 (H) 70 - 99 mg/dL  Troponin I (High Sensitivity)     Status: Abnormal   Collection Time: 01/05/24  6:32 PM  Result Value Ref Range   Troponin I (High Sensitivity) 1,686 (HH) <18 ng/L  APTT     Status: Abnormal   Collection Time: 01/05/24  6:39 PM  Result Value Ref Range   aPTT 87 (H) 24 - 36 seconds  Protime-INR     Status: Abnormal   Collection Time: 01/05/24  6:39 PM  Result Value Ref Range   Prothrombin Time 17.7 (H) 11.4 - 15.2 seconds   INR 1.4 (H) 0.8 - 1.2  CBG monitoring, ED     Status: Abnormal   Collection Time: 01/05/24 10:42 PM  Result Value Ref Range   Glucose-Capillary 50 (L) 70 - 99 mg/dL  Heparin level (unfractionated)     Status: Abnormal   Collection Time: 01/05/24 10:56 PM  Result Value Ref Range   Heparin Unfractionated <0.10 (L) 0.30 - 0.70 IU/mL  HIV Antibody (routine testing w rflx)     Status: None   Collection Time: 01/05/24 10:56 PM  Result Value Ref Range   HIV Screen 4th Generation wRfx Non Reactive Non Reactive  Troponin I (High Sensitivity)     Status: Abnormal   Collection Time: 01/05/24 10:56 PM  Result Value Ref Range   Troponin I (High  Sensitivity) 1,827 (HH) <18 ng/L  TSH     Status: None   Collection Time: 01/05/24 10:56 PM  Result Value Ref Range   TSH 2.618 0.350 - 4.500 uIU/mL  CBG monitoring, ED     Status: None   Collection Time: 01/05/24 11:38 PM  Result Value Ref Range   Glucose-Capillary 82 70 - 99 mg/dL  CBG monitoring, ED     Status: Abnormal   Collection Time: 01/06/24  2:09 AM  Result Value Ref Range   Glucose-Capillary 146 (H) 70 - 99 mg/dL  CBG monitoring, ED     Status: None   Collection Time: 01/06/24  4:15 AM  Result Value Ref Range   Glucose-Capillary 99 70 - 99 mg/dL  Urinalysis, Routine w reflex microscopic -Urine, Clean Catch     Status: Abnormal   Collection Time: 01/06/24  4:31 AM  Result Value Ref Range   Color, Urine YELLOW (A) YELLOW   APPearance HAZY (A) CLEAR   Specific Gravity, Urine 1.031 (H) 1.005 - 1.030   pH 5.0 5.0 - 8.0   Glucose, UA 50 (A) NEGATIVE mg/dL  Hgb urine dipstick NEGATIVE NEGATIVE   Bilirubin Urine NEGATIVE NEGATIVE   Ketones, ur NEGATIVE NEGATIVE mg/dL   Protein, ur >=540 (A) NEGATIVE mg/dL   Nitrite NEGATIVE NEGATIVE   Leukocytes,Ua NEGATIVE NEGATIVE   RBC / HPF 0-5 0 - 5 RBC/hpf   WBC, UA 0-5 0 - 5 WBC/hpf   Bacteria, UA NONE SEEN NONE SEEN   Squamous Epithelial / HPF 0-5 0 - 5 /HPF   Mucus PRESENT    Hyaline Casts, UA PRESENT   CBC     Status: Abnormal   Collection Time: 01/06/24  4:31 AM  Result Value Ref Range   WBC 12.6 (H) 4.0 - 10.5 K/uL   RBC 4.33 3.87 - 5.11 MIL/uL   Hemoglobin 8.9 (L) 12.0 - 15.0 g/dL   HCT 98.1 (L) 19.1 - 47.8 %   MCV 76.0 (L) 80.0 - 100.0 fL   MCH 20.6 (L) 26.0 - 34.0 pg   MCHC 27.1 (L) 30.0 - 36.0 g/dL   RDW 29.5 (H) 62.1 - 30.8 %   Platelets 417 (H) 150 - 400 K/uL   nRBC 0.2 0.0 - 0.2 %  Blood gas, venous     Status: Abnormal   Collection Time: 01/06/24  5:00 AM  Result Value Ref Range   pH, Ven 7.32 7.25 - 7.43   pCO2, Ven 45 44 - 60 mmHg   pO2, Ven 62 (H) 32 - 45 mmHg   Bicarbonate 23.2 20.0 - 28.0 mmol/L    Acid-base deficit 3.1 (H) 0.0 - 2.0 mmol/L   O2 Saturation 88.5 %   Patient temperature 37.0    Collection site VEIN   CBG monitoring, ED     Status: Abnormal   Collection Time: 01/06/24  7:59 AM  Result Value Ref Range   Glucose-Capillary 111 (H) 70 - 99 mg/dL  Heparin level (unfractionated)     Status: Abnormal   Collection Time: 01/06/24  8:54 AM  Result Value Ref Range   Heparin Unfractionated 0.15 (L) 0.30 - 0.70 IU/mL  Troponin I (High Sensitivity)     Status: Abnormal   Collection Time: 01/06/24 10:28 AM  Result Value Ref Range   Troponin I (High Sensitivity) 1,654 (HH) <18 ng/L   CT Angio Chest PE W and/or Wo Contrast Result Date: 01/05/2024 CLINICAL DATA:  Short of breath for 3 days. EXAM: CT ANGIOGRAPHY CHEST WITH CONTRAST TECHNIQUE: Multidetector CT imaging of the chest was performed using the standard protocol during bolus administration of intravenous contrast. Multiplanar CT image reconstructions and MIPs were obtained to evaluate the vascular anatomy. RADIATION DOSE REDUCTION: This exam was performed according to the departmental dose-optimization program which includes automated exposure control, adjustment of the mA and/or kV according to patient size and/or use of iterative reconstruction technique. CONTRAST:  75mL OMNIPAQUE IOHEXOL 350 MG/ML SOLN COMPARISON:  None Available. FINDINGS: Cardiovascular: No filling defects within the pulmonary arteries to suggest acute pulmonary embolism. No pericardial fluid. Mediastinum/Nodes: No axillary or supraclavicular adenopathy. No mediastinal or hilar adenopathy. No pericardial fluid. Esophagus normal. Lungs/Pleura: Ill-defined ground-glass densities in the upper lobes. RIGHT pleural effusion. Small amount fluid along the RIGHT oblique fissure. RIGHT basilar atelectasis. No pulmonary infarction identified. No pneumonia. Mild interstitial thickening at the lung bases. Upper Abdomen: Enlargement of the LEFT adrenal gland to 25 mm mm. No  change from comparison exam where adrenal gland demonstrated density consistent with benign adenoma. Findings consistent benign adenoma. Musculoskeletal: No chest wall abnormality. No acute or significant osseous findings. Review of the MIP  images confirms the above findings. IMPRESSION: 1. No evidence acute pulmonary embolism. 2. RIGHT pleural effusion with RIGHT basilar atelectasis. 3. Mild interstitial thickening at the lung bases suggest interstitial edema. 4. Ill-defined ground-glass densities in the upper lobes. Findings suggest mild pulmonary edema. 5. Benign LEFT adrenal adenoma. Electronically Signed   By: Deboraha Fallow M.D.   On: 01/05/2024 19:10   DG Chest Portable 1 View Result Date: 01/05/2024 CLINICAL DATA:  Shortness of breath EXAM: PORTABLE CHEST 1 VIEW COMPARISON:  None Available. FINDINGS: Some detail limited by body habitus. Lung bases are obscured by overlying soft tissue. Heart is enlarged. IMPRESSION: 1. Detail limited exam without definite plain film evidence of acute process. 2. Enlarged heart. Electronically Signed   By: Reagan Camera M.D.   On: 01/05/2024 16:33     ASSESSMENT AND PLAN: #1 Non-STEMI, peak troponin 1827 with no acute changes on EKG associated with atypical chest pain.  Agree with starting the patient on IV heparin for 48 hours but this could be due to demand ischemia secondary to atrial fibrillation.  However patient does have a history of coronary artery disease and will discuss if she wants to have any further workup such as cardiac catheterization.  Currently she denies any chest pain with treatment. #2 atrial fibrillation with rapid ventricular response rate, advise starting the patient on amiodarone drip and will need anticoagulation with Eliquis after 48 hours of heparin. #3 history of CVA and carotid disease as well as right below-knee amputation.  Patient is high risk for interventional procedures thus will treat the patient medically at this  time.  Khyan Oats Meredeth Stallion

## 2024-01-06 NOTE — Progress Notes (Signed)
   01/06/24 1400  Spiritual Encounters  Type of Visit Initial  Care provided to: [facility] partners present during encounter Social worker/Care management/TOC  Referral source Code page  Reason for visit Code  OnCall Visit Yes   Chaplain responded to Code Blue at 1322 and provided support to partner and daughter in ED family consult area, then escorted family to room. Chaplain will continue to monitor as patient is transferred to ICU.

## 2024-01-06 NOTE — Progress Notes (Signed)
   01/06/24 1600  Spiritual Encounters  Type of Visit Follow up  Care provided to: Family  Reason for visit Urgent spiritual support  OnCall Visit Yes   Chaplain provided follow up support after patient was transferred from ED to ICU.

## 2024-01-06 NOTE — ED Notes (Addendum)
 This RN went to family room to clarify boyfriend concerns in regard to pt presentation upon this RN and boyfriend entering room before code blue was called. This RN explaining to boyfriend that this RN noticed on monitor that pt's HR was dropping and this RN was entering room to assess pt. This RN further explaining that this RN could not speak on why pt was moved from CPOD or events leading up to cardiac arrest. Dr. Meyer Ada present in family room at this time.

## 2024-01-06 NOTE — Consult Note (Signed)
 PHARMACY - ANTICOAGULATION CONSULT NOTE  Pharmacy Consult for IV Heparin Indication: chest pain/ACS  Patient Measurements: Height: 5' 5.5" (166.4 cm) Weight: 117.9 kg (260 lb) IBW/kg (Calculated) : 58.15 HEPARIN DW (KG): 86.3  Labs: Recent Labs    01/05/24 1547 01/05/24 1832 01/05/24 1839 01/05/24 2256 01/06/24 0431 01/06/24 0854  HGB 9.7*  --   --   --  8.9*  --   HCT 35.5*  --   --   --  32.9*  --   PLT 450*  --   --   --  417*  --   APTT  --   --  87*  --   --   --   LABPROT  --   --  17.7*  --   --   --   INR  --   --  1.4*  --   --   --   HEPARINUNFRC  --   --   --  <0.10*  --  0.15*  CREATININE 1.21*  --   --   --   --   --   TROPONINIHS 1,367* 1,686*  --  1,827*  --   --     Estimated Creatinine Clearance: 61.7 mL/min (A) (by C-G formula based on SCr of 1.21 mg/dL (H)).   Medical History: Past Medical History:  Diagnosis Date   Anemia    Anxiety    Arthritis    CHF (congestive heart failure) (HCC)    COPD (chronic obstructive pulmonary disease) (HCC)    Coronary artery disease    Diabetes mellitus without complication (HCC)    Fibromyalgia    GERD (gastroesophageal reflux disease)    Hypertension    Peripheral vascular disease (HCC)    Sleep apnea    Stroke (HCC)     Medications:  No anticoagulation prior to admission per my chart review. Medication reconciliation is pending  Assessment: 63 y/o F with medical history as above presenting to the ED 4/11 with weakness, SOB. Troponin elevated. Pharmacy consulted to manage heparin infusion for ACS.  Baseline aPTT and INR are pending. Baseline CBC notable for anemia which appears c/w baseline.  4/11:  HL @ 2256 = < 0.1, SUBtherapeutic 4/12 0854 HL 0.15   SUBtherapeutic  1400 >> 1650  Goal of Therapy:  Heparin level 0.3-0.7 units/ml Monitor platelets by anticoagulation protocol: Yes   Plan:  4/12 0854 HL 0.15   SUBtherapeutic - Will order heparin 2500 units IV X 1 bolus and increase drip rate to  1650 units/hr - Will recheck HL 6 hrs after rate change --Daily CBC per protocol while on IV heparin  Mazy Culton PharmD Clinical Pharmacist 01/06/2024

## 2024-01-06 NOTE — Consult Note (Signed)
 NEUROLOGY CONSULT NOTE   Date of service: January 06, 2024 Patient Name: [name] MRN:  [id number] DOB:  1960/11/12 Chief Complaint: myoclonus after cardiac arrest Requesting Provider: Erin Fulling, MD  History of Present Illness   This is a 63 year old woman with past medical history significant CAD status post PCI, PAD status post right BKA, left carotid endarterectomy, prior CVA in 2021, morbid obesity with OSA on CPAP, COPD, left-sided diabetic Hemi chorea who was admitted overnight with new onset A-fib with RVR, NSTEMI, acute decompensated heart failure, acute hypoxic respiratory failure requiring oxygen supplementation.  When she was seen on rounds by the hospitalist this morning she complained of shortness of breath and occasional heartburn but no other symptoms.  She was on a heparin drip as well as IV Cardizem and IV furosemide for CHF.  Nurse noted that she developed bradycardia with heart rate in the 30s then 20s then it went into cardiac arrest.  CODE BLUE was activated at 1323 and ACLS of the call was initiated by ED staff.  Initial rhythm was asystole.  She received 4 doses of epi and achieved ROSC 1336.  She also received a dose of magnesium 2 g.  CCM evaluated the patient at bedside and reported that she had persistent eyelid myoclonus.  They called me down to evaluate and this had stopped by the time of my exam although she had ongoing tremors in her chin that did not appear epileptic.  On exam after cardiac arrest off sedation with no RSI drugs on board her patient is comatose and does not respond to commands, pupils were 2mm bilat and responsive to light, (+) corneals and cough, (-) oculocephalics and gag. No response to pain in any extremity.   ROS   Unable to ascertain due to comatose  Past History   Past Medical History:  Diagnosis Date   Anemia    Anxiety    Arthritis    CHF (congestive heart failure) (HCC)    COPD (chronic obstructive pulmonary disease) (HCC)     Coronary artery disease    Diabetes mellitus without complication (HCC)    Fibromyalgia    GERD (gastroesophageal reflux disease)    Hypertension    Peripheral vascular disease (HCC)    Sleep apnea    Stroke [facility])     Past Surgical History:  Procedure Laterality Date   CESAREAN SECTION     COLONOSCOPY WITH PROPOFOL N/A 08/02/2022   Procedure: COLONOSCOPY WITH PROPOFOL;  Surgeon: Midge Minium, MD;  Location: [facility] ENDOSCOPY;  Service: Endoscopy;  Laterality: N/A;   CORONARY ANGIOPLASTY WITH STENT PLACEMENT Left    DILATION AND CURETTAGE OF UTERUS     ENDARTERECTOMY Left 01/29/2015   Procedure: ENDARTERECTOMY CAROTID;  Surgeon: Annice Needy, MD;  Location: [facility] ORS;  Service: Vascular;  Laterality: Left;   LOWER EXTREMITY ANGIOGRAPHY Left 09/24/2020   Procedure: Lower Extremity Angiography;  Surgeon: Annice Needy, MD;  Location: [facility] INVASIVE CV LAB;  Service: Cardiovascular;  Laterality: Left;   PERIPHERAL VASCULAR CATHETERIZATION Right 05/27/2015   Procedure: Lower Extremity Angiography;  Surgeon: Annice Needy, MD;  Location: [facility] INVASIVE CV LAB;  Service: Cardiovascular;  Laterality: Right;   PERIPHERAL VASCULAR CATHETERIZATION  05/27/2015   Procedure: Lower Extremity Intervention;  Surgeon: Annice Needy, MD;  Location: [facility] INVASIVE CV LAB;  Service: Cardiovascular;;   PERIPHERAL VASCULAR CATHETERIZATION Right 09/22/2016   Procedure: Lower Extremity Angiography;  Surgeon: Annice Needy, MD;  Location: [facility] INVASIVE CV LAB;  Service:  Cardiovascular;  Laterality: Right;   TUBAL LIGATION      Family History: History reviewed. No pertinent family history.  Social History  reports that she quit smoking about 7 years ago. Her smoking use included cigarettes. She started smoking about 45 years ago. She has a 9.5 pack-year smoking history. She uses smokeless tobacco. She reports that she does not drink alcohol and does not use drugs.  Allergies  Allergen Reactions   Niacin And Related Nausea  And Vomiting    Medications   Current Facility-Administered Medications:    acetaminophen (TYLENOL) tablet 650 mg, 650 mg, Oral, Q4H PRN, Duncan, Hazel V, MD   aspirin EC tablet 81 mg, 81 mg, Oral, Daily, Lanetta Pion, MD, 81 mg at 01/06/24 0912   atorvastatin (LIPITOR) tablet 40 mg, 40 mg, Oral, Daily, Lanetta Pion, MD, 40 mg at 01/06/24 0913   buPROPion (WELLBUTRIN XL) 24 hr tablet 300 mg, 300 mg, Oral, Daily, Brion Cancel V, MD, 300 mg at 01/06/24 0914   dextrose 10 % infusion, , Intravenous, Continuous, Lanetta Pion, MD, Last Rate: 40 mL/hr at 01/06/24 0022, New Bag at 01/06/24 0022   dextrose 50 % solution 50 mL, 1 ampule, Intravenous, PRN, Lanetta Pion, MD, 50 mL at 01/05/24 2304   docusate (COLACE) 50 MG/5ML liquid 100 mg, 100 mg, Per Tube, BID, Nelson, Dana G, NP   DULoxetine (CYMBALTA) DR capsule 60 mg, 60 mg, Oral, Daily, Brion Cancel V, MD, 60 mg at 01/06/24 0913   enalapril (VASOTEC) tablet 5 mg, 5 mg, Oral, Daily, Brion Cancel V, MD, 5 mg at 01/06/24 0915   furosemide (LASIX) injection 20 mg, 20 mg, Intravenous, Q12H, Lanetta Pion, MD, 20 mg at 01/06/24 0914   insulin aspart (novoLOG) injection 0-15 Units, 0-15 Units, Subcutaneous, Q4H, Nelson, Dana G, NP   melatonin tablet 5 mg, 5 mg, Oral, QHS PRN, Lanetta Pion, MD   nitroGLYCERIN (NITROSTAT) SL tablet 0.4 mg, 0.4 mg, Sublingual, Q5 Min x 3 PRN, Lanetta Pion, MD   ondansetron (ZOFRAN) injection 4 mg, 4 mg, Intravenous, Q6H PRN, Lanetta Pion, MD   polyethylene glycol (MIRALAX / GLYCOLAX) packet 17 g, 17 g, Per Tube, Daily, Nelson, Dana G, NP   sodium bicarbonate 150 mEq in sterile water 1,150 mL infusion, , Intravenous, Continuous, Nelson, Dana G, NP  Current Outpatient Medications:    atorvastatin (LIPITOR) 40 MG tablet, Take 40 mg by mouth every evening., Disp: , Rfl:    buPROPion (WELLBUTRIN XL) 300 MG 24 hr tablet, Take 300 mg by mouth daily. Pt taking 300 mg by mouth daily, Disp: , Rfl:     cetirizine (ZYRTEC) 10 MG tablet, Take 10 mg by mouth daily., Disp: , Rfl:    DULoxetine (CYMBALTA) 60 MG capsule, Take 60 mg by mouth daily., Disp: , Rfl:    enalapril (VASOTEC) 5 MG tablet, Take 5 mg by mouth daily., Disp: , Rfl:    HUMULIN R U-500 KWIKPEN 500 UNIT/ML kwikpen, Inject 40-80 Units into the skin See admin instructions. Inject 80u under the skin every morning before breakfast and inject 40u under the skin every evening before supper, Disp: , Rfl: 3   metFORMIN (GLUCOPHAGE-XR) 500 MG 24 hr tablet, Take 500 mg by mouth 2 (two) times daily with a meal., Disp: , Rfl:    OLANZapine (ZYPREXA) 5 MG tablet, Take 5 mg by mouth daily., Disp: , Rfl:    oxybutynin (DITROPAN-XL) 10 MG 24 hr tablet, Take 10 mg  by mouth daily., Disp: , Rfl:    pantoprazole (PROTONIX) 40 MG tablet, Take 40 mg by mouth daily., Disp: , Rfl:    traZODone (DESYREL) 50 MG tablet, Take 100 mg by mouth at bedtime., Disp: , Rfl:    collagenase (SANTYL) ointment, Apply topically daily. (Patient not taking: Reported on 2024/02/03), Disp: 15 g, Rfl: 0   Dulaglutide (TRULICITY) 0.75 MG/0.5ML SOPN, Inject 0.75 mg into the skin every Tuesday. (Patient not taking: Reported on 06/24/2022), Disp: , Rfl:    gabapentin (NEURONTIN) 300 MG capsule, Take 300-600 mg by mouth at bedtime. (Patient not taking: Reported on 03-Feb-2024), Disp: , Rfl:   Vitals   Vitals:   01/06/24 1416 01/06/24 1425 01/06/24 1430 01/06/24 1436  BP: (!) 102/59 99/62 101/62 92/72  Pulse:  85 84 81  Resp: 16 19 19 14   Temp: (!) 97.4 F (36.3 C) 97.6 F (36.4 C) 97.7 F (36.5 C) 97.8 F (36.6 C)  TempSrc:      SpO2:  100%    Weight:      Height:        Body mass index is 42.61 kg/m.  Physical Exam   Gen: patient lying in bed, intubated, not sedated CV: extremities appear well-perfused Resp: ventilated  Neurologic Examination   GCS 3 off sedation  MS: comatose, no response to commands or noxious stimuli Speech: intubated CN: Pupils 2mm ERRL,  (+) corneals and cough, (-) oculocephalics and gag Motor & sensory: no response to noxious stimuli in any extremity Reflexes: 1+ throughout toes mute  Labs/Imaging/Neurodiagnostic studies   CBC:  Recent Labs  Lab 2024/02/03 1547 01/06/24 0431  WBC 11.9* 12.6*  NEUTROABS 7.9*  --   HGB 9.7* 8.9*  HCT 35.5* 32.9*  MCV 77.9* 76.0*  PLT 450* 417*   Basic Metabolic Panel:  Lab Results  Component Value Date   NA 141 02/03/24   K 3.7 02/03/2024   CO2 26 02/03/24   GLUCOSE 58 (L) 02-03-24   BUN 27 (H) 2024/02/03   CREATININE 1.21 (H) 02-03-24   CALCIUM 9.2 03-Feb-2024   GFRNONAA 50 (L) 2024/02/03   GFRAA >60 09/20/2016   Lipid Panel: No results found for: "LDLCALC" HgbA1c:  Lab Results  Component Value Date   HGBA1C 8.6 (H) 09/23/2020   Urine Drug Screen: No results found for: "LABOPIA", "COCAINSCRNUR", "LABBENZ", "AMPHETMU", "THCU", "LABBARB"  Alcohol Level No results found for: "ETH" INR  Lab Results  Component Value Date   INR 1.4 (H) 02-03-2024   APTT  Lab Results  Component Value Date   APTT 87 (H) 2024-02-03   AED levels: No results found for: "PHENYTOIN", "ZONISAMIDE", "LAMOTRIGINE", "LEVETIRACETA"  CT Head without contrast(Personally reviewed): No ICH or other acute findings  ASSESSMENT   This is a 63 year old woman with past medical history significant CAD status post PCI, PAD status post right BKA, left carotid endarterectomy, prior CVA in 2021, morbid obesity with OSA on CPAP, COPD, left-sided diabetic Hemi chorea who was admitted overnight with new onset A-fib with RVR, NSTEMI, acute decompensated heart failure, acute hypoxic respiratory failure requiring oxygen supplementation who suffered inpatient cardiac arrest today after brief bout of severe bradycardia/ Time to ROSC was 5 minutes. Immediately after ROSC she developed persistent eyelid myoclonus which has now resolved. On exam off sedation she has a GCS of 3 with some brainstem reflexes intact  (pupils, corneals, cough) but no response to commands and no motor response to noxious stimuli in any extremity. She was on heparin gtt for  a fib but head CT showed no e/o ICH.  RECOMMENDATIONS   - OK to resume heparin gtt if cardiology recommends doing so - Keppra 2g load f/b 500mg  bid for myoclonus - Ceribell - Will continue to follow for prognostication, recommend deferring MRI brain until day 3 after cardiac arrest  This patient is critically ill and at significant risk of neurological worsening, death and care requires constant monitoring of vital signs, hemodynamics,respiratory and cardiac monitoring, neurological assessment, discussion with family, other specialists and medical decision making of high complexity. I spent 65 minutes of neurocritical care time  in the care of  this patient. This was time spent independent of any time provided by nurse practitioner or PA.  Greg Leaks, MD [facility] [phone]  If 7pm- 7am, please page neurology on call as listed in AMION.

## 2024-01-06 NOTE — ED Notes (Signed)
Bear-warmer applied

## 2024-01-06 NOTE — Plan of Care (Addendum)
 The patient remains in [facility] at time of writing. The patient is ventilated, not sedated. GCS = 4. Cough, gag, and pupillary reflex intact; corneal reflexes non-intact. TTM initiated and functioning as of 2010 hours. Upon change of shift, the [name] was in place but not functioning; after ~ 1 hour of trouble-shooting the [name] was functioning and the patient met controlling temp as of 2010 hours. Spot-check of EtCO2 is ~ 40-42. Continuous infusions of bicarb and heparin via PIV. Foley catheter remains in place. Family at bedside.  2115 hours: [name] with [facility] overnight; referral initiated per protocol. [facility] will continue to follow.   Problem: Education: Goal: Ability to describe self-care measures that may prevent or decrease complications (Diabetes Survival Skills Education) will improve Outcome: Not Progressing Goal: Individualized Educational Video(s) Outcome: Not Progressing   Problem: Coping: Goal: Ability to adjust to condition or change in health will improve Outcome: Not Progressing   Problem: Fluid Volume: Goal: Ability to maintain a balanced intake and output will improve Outcome: Not Progressing   Problem: Health Behavior/Discharge Planning: Goal: Ability to identify and utilize available resources and services will improve Outcome: Not Progressing Goal: Ability to manage health-related needs will improve Outcome: Not Progressing   Problem: Metabolic: Goal: Ability to maintain appropriate glucose levels will improve Outcome: Not Progressing   Problem: Nutritional: Goal: Maintenance of adequate nutrition will improve Outcome: Not Progressing Goal: Progress toward achieving an optimal weight will improve Outcome: Not Progressing   Problem: Skin Integrity: Goal: Risk for impaired skin integrity will decrease Outcome: Not Progressing   Problem: Tissue Perfusion: Goal: Adequacy of tissue perfusion will improve Outcome: Not Progressing    Problem: Education: Goal: Understanding of cardiac disease, CV risk reduction, and recovery process will improve Outcome: Not Progressing Goal: Individualized Educational Video(s) Outcome: Not Progressing   Problem: Activity: Goal: Ability to tolerate increased activity will improve Outcome: Not Progressing   Problem: Cardiac: Goal: Ability to achieve and maintain adequate cardiovascular perfusion will improve Outcome: Not Progressing   Problem: Health Behavior/Discharge Planning: Goal: Ability to safely manage health-related needs after discharge will improve Outcome: Not Progressing   Problem: Education: Goal: Knowledge of disease or condition will improve Outcome: Not Progressing Goal: Understanding of medication regimen will improve Outcome: Not Progressing Goal: Individualized Educational Video(s) Outcome: Not Progressing   Problem: Activity: Goal: Ability to tolerate increased activity will improve Outcome: Not Progressing   Problem: Cardiac: Goal: Ability to achieve and maintain adequate cardiopulmonary perfusion will improve Outcome: Not Progressing   Problem: Health Behavior/Discharge Planning: Goal: Ability to safely manage health-related needs after discharge will improve Outcome: Not Progressing   Problem: Education: Goal: Knowledge of General Education information will improve Description: Including pain rating scale, medication(s)/side effects and non-pharmacologic comfort measures Outcome: Not Progressing   Problem: Health Behavior/Discharge Planning: Goal: Ability to manage health-related needs will improve Outcome: Not Progressing   Problem: Clinical Measurements: Goal: Ability to maintain clinical measurements within normal limits will improve Outcome: Not Progressing Goal: Will remain free from infection Outcome: Not Progressing Goal: Diagnostic test results will improve Outcome: Not Progressing Goal: Respiratory complications will  improve Outcome: Not Progressing Goal: Cardiovascular complication will be avoided Outcome: Not Progressing   Problem: Activity: Goal: Risk for activity intolerance will decrease Outcome: Not Progressing   Problem: Nutrition: Goal: Adequate nutrition will be maintained Outcome: Not Progressing   Problem: Coping: Goal: Level of anxiety will decrease Outcome: Not Progressing   Problem: Elimination: Goal: Will not experience complications related to bowel  motility Outcome: Not Progressing Goal: Will not experience complications related to urinary retention Outcome: Not Progressing   Problem: Pain Managment: Goal: General experience of comfort will improve and/or be controlled Outcome: Not Progressing   Problem: Safety: Goal: Ability to remain free from injury will improve Outcome: Not Progressing   Problem: Skin Integrity: Goal: Risk for impaired skin integrity will decrease Outcome: Not Progressing   Problem: Activity: Goal: Ability to tolerate increased activity will improve Outcome: Not Progressing   Problem: Respiratory: Goal: Ability to maintain a clear airway and adequate ventilation will improve Outcome: Not Progressing   Problem: Role Relationship: Goal: Method of communication will improve Outcome: Not Progressing   Problem: Education: Goal: Ability to demonstrate management of disease process will improve Outcome: Not Progressing Goal: Ability to verbalize understanding of medication therapies will improve Outcome: Not Progressing Goal: Individualized Educational Video(s) Outcome: Not Progressing   Problem: Activity: Goal: Capacity to carry out activities will improve Outcome: Not Progressing

## 2024-01-07 DIAGNOSIS — I469 Cardiac arrest, cause unspecified: Secondary | ICD-10-CM | POA: Diagnosis not present

## 2024-01-07 DIAGNOSIS — J9601 Acute respiratory failure with hypoxia: Secondary | ICD-10-CM | POA: Diagnosis not present

## 2024-01-07 DIAGNOSIS — Z789 Other specified health status: Secondary | ICD-10-CM

## 2024-01-07 DIAGNOSIS — I509 Heart failure, unspecified: Secondary | ICD-10-CM

## 2024-01-07 DIAGNOSIS — I4891 Unspecified atrial fibrillation: Secondary | ICD-10-CM | POA: Diagnosis not present

## 2024-01-07 DIAGNOSIS — Z711 Person with feared health complaint in whom no diagnosis is made: Secondary | ICD-10-CM | POA: Diagnosis not present

## 2024-01-07 DIAGNOSIS — Z515 Encounter for palliative care: Secondary | ICD-10-CM | POA: Diagnosis not present

## 2024-01-07 DIAGNOSIS — Z7189 Other specified counseling: Secondary | ICD-10-CM | POA: Diagnosis not present

## 2024-01-07 DIAGNOSIS — G253 Myoclonus: Secondary | ICD-10-CM | POA: Diagnosis not present

## 2024-01-07 DIAGNOSIS — R569 Unspecified convulsions: Secondary | ICD-10-CM | POA: Diagnosis not present

## 2024-01-07 DIAGNOSIS — I5031 Acute diastolic (congestive) heart failure: Secondary | ICD-10-CM | POA: Diagnosis not present

## 2024-01-07 DIAGNOSIS — I214 Non-ST elevation (NSTEMI) myocardial infarction: Secondary | ICD-10-CM | POA: Diagnosis not present

## 2024-01-07 DIAGNOSIS — G9341 Metabolic encephalopathy: Secondary | ICD-10-CM | POA: Diagnosis not present

## 2024-01-07 LAB — LACTIC ACID, PLASMA: Lactic Acid, Venous: 2.7 mmol/L (ref 0.5–1.9)

## 2024-01-07 LAB — RENAL FUNCTION PANEL
Albumin: 2.9 g/dL — ABNORMAL LOW (ref 3.5–5.0)
Anion gap: 14 (ref 5–15)
BUN: 44 mg/dL — ABNORMAL HIGH (ref 8–23)
CO2: 22 mmol/L (ref 22–32)
Calcium: 8.3 mg/dL — ABNORMAL LOW (ref 8.9–10.3)
Chloride: 100 mmol/L (ref 98–111)
Creatinine, Ser: 1.93 mg/dL — ABNORMAL HIGH (ref 0.44–1.00)
GFR, Estimated: 29 mL/min — ABNORMAL LOW (ref >60)
Glucose, Bld: 197 mg/dL — ABNORMAL HIGH (ref 70–99)
Phosphorus: 6 mg/dL — ABNORMAL HIGH (ref 2.5–4.6)
Potassium: 4.6 mmol/L (ref 3.5–5.1)
Sodium: 136 mmol/L (ref 135–145)

## 2024-01-07 LAB — CBC
HCT: 35.8 % — ABNORMAL LOW (ref 36.0–46.0)
HCT: 36 % (ref 36.0–46.0)
Hemoglobin: 10 g/dL — ABNORMAL LOW (ref 12.0–15.0)
Hemoglobin: 9.9 g/dL — ABNORMAL LOW (ref 12.0–15.0)
MCH: 21.2 pg — ABNORMAL LOW (ref 26.0–34.0)
MCH: 20.7 pg — ABNORMAL LOW (ref 26.0–34.0)
MCHC: 27.9 g/dL — ABNORMAL LOW (ref 30.0–36.0)
MCHC: 27.5 g/dL — ABNORMAL LOW (ref 30.0–36.0)
MCV: 75.8 fL — ABNORMAL LOW (ref 80.0–100.0)
MCV: 75.2 fL — ABNORMAL LOW (ref 80.0–100.0)
Platelets: 485 10*3/uL — ABNORMAL HIGH (ref 150–400)
Platelets: 442 10*3/uL — ABNORMAL HIGH (ref 150–400)
RBC: 4.72 MIL/uL (ref 3.87–5.11)
RBC: 4.79 MIL/uL (ref 3.87–5.11)
RDW: 17.5 % — ABNORMAL HIGH (ref 11.5–15.5)
RDW: 17.6 % — ABNORMAL HIGH (ref 11.5–15.5)
WBC: 25.2 10*3/uL — ABNORMAL HIGH (ref 4.0–10.5)
WBC: 25.5 10*3/uL — ABNORMAL HIGH (ref 4.0–10.5)
nRBC: 0.2 % (ref 0.0–0.2)
nRBC: 0.2 % (ref 0.0–0.2)

## 2024-01-07 LAB — MAGNESIUM
Magnesium: 2.7 mg/dL — ABNORMAL HIGH (ref 1.7–2.4)
Magnesium: 2.3 mg/dL (ref 1.7–2.4)

## 2024-01-07 LAB — COMPREHENSIVE METABOLIC PANEL WITH GFR
ALT: 110 U/L — ABNORMAL HIGH (ref 0–44)
AST: 90 U/L — ABNORMAL HIGH (ref 15–41)
Albumin: 2.8 g/dL — ABNORMAL LOW (ref 3.5–5.0)
Alkaline Phosphatase: 101 U/L (ref 38–126)
Anion gap: 12 (ref 5–15)
BUN: 43 mg/dL — ABNORMAL HIGH (ref 8–23)
CO2: 23 mmol/L (ref 22–32)
Calcium: 8.4 mg/dL — ABNORMAL LOW (ref 8.9–10.3)
Chloride: 101 mmol/L (ref 98–111)
Creatinine, Ser: 2.05 mg/dL — ABNORMAL HIGH (ref 0.44–1.00)
GFR, Estimated: 27 mL/min — ABNORMAL LOW (ref >60)
Glucose, Bld: 198 mg/dL — ABNORMAL HIGH (ref 70–99)
Potassium: 4.5 mmol/L (ref 3.5–5.1)
Sodium: 136 mmol/L (ref 135–145)
Total Bilirubin: 0.9 mg/dL (ref 0.0–1.2)
Total Protein: 6.4 g/dL — ABNORMAL LOW (ref 6.5–8.1)

## 2024-01-07 LAB — PHOSPHORUS: Phosphorus: 5.7 mg/dL — ABNORMAL HIGH (ref 2.5–4.6)

## 2024-01-07 LAB — HEPARIN LEVEL (UNFRACTIONATED)
Heparin Unfractionated: 0.41 [IU]/mL (ref 0.30–0.70)
Heparin Unfractionated: 0.51 [IU]/mL (ref 0.30–0.70)

## 2024-01-07 LAB — GLUCOSE, CAPILLARY
Glucose-Capillary: 179 mg/dL — ABNORMAL HIGH (ref 70–99)
Glucose-Capillary: 182 mg/dL — ABNORMAL HIGH (ref 70–99)
Glucose-Capillary: 183 mg/dL — ABNORMAL HIGH (ref 70–99)
Glucose-Capillary: 184 mg/dL — ABNORMAL HIGH (ref 70–99)
Glucose-Capillary: 207 mg/dL — ABNORMAL HIGH (ref 70–99)
Glucose-Capillary: 215 mg/dL — ABNORMAL HIGH (ref 70–99)

## 2024-01-07 LAB — TROPONIN I (HIGH SENSITIVITY): Troponin I (High Sensitivity): 847 ng/L (ref <18)

## 2024-01-07 MED ORDER — VITAL AF 1.2 CAL PO LIQD
1000.0000 mL | ORAL | Status: DC
  Administered 2024-01-07: 1000 mL

## 2024-01-07 MED ORDER — PANTOPRAZOLE SODIUM 40 MG IV SOLR
40.0000 mg | Freq: Every day | INTRAVENOUS | Status: DC
  Administered 2024-01-07 – 2024-01-08 (×2): 40 mg via INTRAVENOUS
  Filled 2024-01-07 (×2): qty 10

## 2024-01-07 MED ORDER — HEPARIN BOLUS VIA INFUSION
2600.0000 [IU] | Freq: Once | INTRAVENOUS | Status: AC
  Administered 2024-01-07: 2600 [IU] via INTRAVENOUS
  Filled 2024-01-07: qty 2600

## 2024-01-07 MED ORDER — MIDAZOLAM HCL 2 MG/2ML IJ SOLN
2.0000 mg | INTRAMUSCULAR | Status: DC | PRN
  Administered 2024-01-07: 4 mg via INTRAVENOUS
  Administered 2024-01-07: 2 mg via INTRAVENOUS
  Administered 2024-01-07: 4 mg via INTRAVENOUS
  Administered 2024-01-08: 2 mg via INTRAVENOUS
  Filled 2024-01-07 (×2): qty 2
  Filled 2024-01-07 (×2): qty 4

## 2024-01-07 MED ORDER — PIPERACILLIN-TAZOBACTAM 3.375 G IVPB
3.3750 g | Freq: Three times a day (TID) | INTRAVENOUS | Status: AC
  Administered 2024-01-07 – 2024-01-09 (×8): 3.375 g via INTRAVENOUS
  Filled 2024-01-07 (×8): qty 50

## 2024-01-07 MED ORDER — PROPOFOL 1000 MG/100ML IV EMUL
0.0000 ug/kg/min | INTRAVENOUS | Status: DC
  Administered 2024-01-07: 5 ug/kg/min via INTRAVENOUS
  Administered 2024-01-07: 10 ug/kg/min via INTRAVENOUS
  Administered 2024-01-08 (×2): 35 ug/kg/min via INTRAVENOUS
  Administered 2024-01-08: 5 ug/kg/min via INTRAVENOUS
  Administered 2024-01-09: 15 ug/kg/min via INTRAVENOUS
  Administered 2024-01-09: 30 ug/kg/min via INTRAVENOUS
  Administered 2024-01-09: 35 ug/kg/min via INTRAVENOUS
  Administered 2024-01-09 – 2024-01-10 (×2): 5 ug/kg/min via INTRAVENOUS
  Administered 2024-01-11: 30 ug/kg/min via INTRAVENOUS
  Administered 2024-01-11: 20 ug/kg/min via INTRAVENOUS
  Administered 2024-01-11: 40 ug/kg/min via INTRAVENOUS
  Administered 2024-01-11: 20 ug/kg/min via INTRAVENOUS
  Administered 2024-01-11 – 2024-01-12 (×2): 30 ug/kg/min via INTRAVENOUS
  Filled 2024-01-07 (×17): qty 100

## 2024-01-07 MED ORDER — PROSOURCE TF20 ENFIT COMPATIBL EN LIQD
60.0000 mL | Freq: Two times a day (BID) | ENTERAL | Status: DC
Start: 2024-01-07 — End: 2024-01-12
  Administered 2024-01-07 – 2024-01-11 (×10): 60 mL
  Filled 2024-01-07: qty 60

## 2024-01-07 MED ORDER — THIAMINE MONONITRATE 100 MG PO TABS
100.0000 mg | ORAL_TABLET | Freq: Every day | ORAL | Status: DC
  Administered 2024-01-07 – 2024-01-11 (×5): 100 mg
  Filled 2024-01-07 (×5): qty 1

## 2024-01-07 MED ORDER — HYDRALAZINE HCL 20 MG/ML IJ SOLN
5.0000 mg | INTRAMUSCULAR | Status: DC | PRN
  Administered 2024-01-07: 5 mg via INTRAVENOUS
  Filled 2024-01-07: qty 1

## 2024-01-07 NOTE — Consult Note (Addendum)
 PHARMACY - ANTICOAGULATION CONSULT NOTE  Pharmacy Consult for IV Heparin Indication: chest pain/ACS  Patient Measurements: Height: 5' 5.5" (166.4 cm) Weight: 123.8 kg (272 lb 14.9 oz) IBW/kg (Calculated) : 58.15 HEPARIN DW (KG): 88  Labs: Recent Labs    01/05/24 1547 01/05/24 1832 01/05/24 1839 01/05/24 2256 01/06/24 0431 01/06/24 0854 01/06/24 1028 01/06/24 1549 01/06/24 2327  HGB 9.7*  --   --   --  8.9*  --   --   --   --   HCT 35.5*  --   --   --  32.9*  --   --   --   --   PLT 450*  --   --   --  417*  --   --   --   --   APTT  --   --  87*  --   --   --   --   --   --   LABPROT  --   --  17.7*  --   --   --   --   --   --   INR  --   --  1.4*  --   --   --   --   --   --   HEPARINUNFRC  --   --   --  <0.10*  --  0.15*  --   --  <0.10*  CREATININE 1.21*  --   --   --   --   --   --  1.86*  --   TROPONINIHS 1,367*   < >  --  1,827*  --   --  1,654* 1,467*  --    < > = values in this interval not displayed.    Estimated Creatinine Clearance: 41.2 mL/min (A) (by C-G formula based on SCr of 1.86 mg/dL (H)).   Medical History: Past Medical History:  Diagnosis Date   Anemia    Anxiety    Arthritis    CHF (congestive heart failure) (HCC)    COPD (chronic obstructive pulmonary disease) (HCC)    Coronary artery disease    Diabetes mellitus without complication (HCC)    Fibromyalgia    GERD (gastroesophageal reflux disease)    Hypertension    Peripheral vascular disease (HCC)    Sleep apnea    Stroke (HCC)     Medications:  No anticoagulation prior to admission per my chart review. Medication reconciliation is pending  Assessment: 63 y/o F with medical history as above presenting to the ED 4/11 with weakness, SOB. Troponin elevated. Pharmacy consulted to manage heparin infusion for ACS.  Baseline aPTT and INR are pending. Baseline CBC notable for anemia which appears c/w baseline.  4/11:  HL @ 2256 = < 0.1, SUBtherapeutic 4/12 0854 HL 0.15   SUBtherapeutic   1400 >> 1650 4/12 2327 HL < 0.1  SUBtherapeutic  1650 units/hr   Goal of Therapy:  Heparin level 0.3-0.7 units/ml Monitor platelets by anticoagulation protocol: Yes   Plan:  4/12: HL @ 2327 = < 0.1,   SUBtherapeutic - RN stated PIV for heparin drip was not working.  Heparin resumed in alt IV site around 0030.  - Will order heparin 2600 units IV X 1 bolus but maintain heparin infusion @ 1650 units/hr - Will recheck HL 6 hrs after bolus - Daily CBC per protocol while on IV heparin  Kacee Sukhu D Clinical Pharmacist 01/07/2024

## 2024-01-07 NOTE — Plan of Care (Signed)
 Continuing with plan of care.

## 2024-01-07 NOTE — Consult Note (Signed)
 PHARMACY - ANTICOAGULATION CONSULT NOTE  Pharmacy Consult for IV Heparin Indication: chest pain/ACS  Patient Measurements: Height: 5' 5.5" (166.4 cm) Weight: 124.7 kg (274 lb 14.6 oz) IBW/kg (Calculated) : 58.15 HEPARIN DW (KG): 88  Labs: Recent Labs    01/05/24 1839 01/05/24 2256 01/06/24 0431 01/06/24 0854 01/06/24 1028 01/06/24 1549 01/06/24 2327 01/07/24 0258 01/07/24 0516  HGB  --   --  8.9*  --   --   --   --  10.0* 9.9*  HCT  --   --  32.9*  --   --   --   --  35.8* 36.0  PLT  --   --  417*  --   --   --   --  485* 442*  APTT 87*  --   --   --   --   --   --   --   --   LABPROT 17.7*  --   --   --   --   --   --   --   --   INR 1.4*  --   --   --   --   --   --   --   --   HEPARINUNFRC  --    < >  --  0.15*  --   --  <0.10*  --  0.41  CREATININE  --   --   --   --   --  1.86*  --  1.93* 2.05*  TROPONINIHS  --    < >  --   --  1,654* 1,467*  --   --  847*   < > = values in this interval not displayed.    Estimated Creatinine Clearance: 37.6 mL/min (A) (by C-G formula based on SCr of 2.05 mg/dL (H)).   Medical History: Past Medical History:  Diagnosis Date   Anemia    Anxiety    Arthritis    CHF (congestive heart failure) (HCC)    COPD (chronic obstructive pulmonary disease) (HCC)    Coronary artery disease    Diabetes mellitus without complication (HCC)    Fibromyalgia    GERD (gastroesophageal reflux disease)    Hypertension    Peripheral vascular disease (HCC)    Sleep apnea    Stroke (HCC)     Medications:  No anticoagulation prior to admission per my chart review  Assessment: 63 y/o F with medical history as above presenting to the ED 4/11 with weakness, SOB. Troponin elevated. Pharmacy consulted to manage heparin infusion for ACS.  4/11:  HL @ 2256 = < 0.1, SUBtherapeutic 4/12 0854 HL 0.15   SUBtherapeutic  1400 >> 1650 4/12 2327 HL < 0.1  SUBtherapeutic  1650 units/hr  4/13 0516 HL 0.41   Therapeutic X 1   Goal of Therapy:  Heparin level  0.3-0.7 units/ml Monitor platelets by anticoagulation protocol: Yes   Plan: heparin level therapeutic X 2 --continue heparin infusion at 1650 units/hr --recheck next heparin level in am 04/14  - Daily CBC per protocol while on IV heparin  Adalberto Acton Clinical Pharmacist 01/07/2024

## 2024-01-07 NOTE — Progress Notes (Signed)
 SUBJECTIVE: Intubated unresponsive   Vitals:   01/07/24 0900 01/07/24 1000 01/07/24 1100 01/07/24 1110  BP: (!) 161/102 (!) 159/94 (!) 148/111   Pulse: 96 (!) 103 (!) 106   Resp: (!) 25 (!) 28 (!) 29   Temp: 99 F (37.2 C) 99.5 F (37.5 C) 99.9 F (37.7 C)   TempSrc:      SpO2: 99% 100% 97% 99%  Weight:      Height:        Intake/Output Summary (Last 24 hours) at 01/07/2024 1231 Last data filed at 01/07/2024 1000 Gross per 24 hour  Intake 2594.39 ml  Output 1050 ml  Net 1544.39 ml    LABS: Basic Metabolic Panel: Recent Labs    01/07/24 0258 01/07/24 0516  NA 136 136  K 4.6 4.5  CL 100 101  CO2 22 23  GLUCOSE 197* 198*  BUN 44* 43*  CREATININE 1.93* 2.05*  CALCIUM 8.3* 8.4*  MG 2.7* 2.3  PHOS 6.0* 5.7*   Liver Function Tests: Recent Labs    01/06/24 1549 01/07/24 0258 01/07/24 0516  AST 101*  --  90*  ALT 84*  --  110*  ALKPHOS 99  --  101  BILITOT 1.0  --  0.9  PROT 6.2*  --  6.4*  ALBUMIN 2.7* 2.9* 2.8*   Recent Labs    01/05/24 1547  LIPASE 21   CBC: Recent Labs    01/05/24 1547 01/06/24 0431 01/07/24 0258 01/07/24 0516  WBC 11.9*   < > 25.2* 25.5*  NEUTROABS 7.9*  --   --   --   HGB 9.7*   < > 10.0* 9.9*  HCT 35.5*   < > 35.8* 36.0  MCV 77.9*   < > 75.8* 75.2*  PLT 450*   < > 485* 442*   < > = values in this interval not displayed.   Cardiac Enzymes: No results for input(s): "CKTOTAL", "CKMB", "CKMBINDEX", "TROPONINI" in the last 72 hours. BNP: Invalid input(s): "POCBNP" D-Dimer: No results for input(s): "DDIMER" in the last 72 hours. Hemoglobin A1C: No results for input(s): "HGBA1C" in the last 72 hours. Fasting Lipid Panel: No results for input(s): "CHOL", "HDL", "LDLCALC", "TRIG", "CHOLHDL", "LDLDIRECT" in the last 72 hours. Thyroid Function Tests: Recent Labs    01/05/24 2256  TSH 2.618   Anemia Panel: No results for input(s): "VITAMINB12", "FOLATE", "FERRITIN", "TIBC", "IRON", "RETICCTPCT" in the last 72  hours.   PHYSICAL EXAM General: Well developed, well nourished, in no acute distress HEENT:  Normocephalic and atramatic Neck:  No JVD.  Lungs: Clear bilaterally to auscultation and percussion. Heart: HRRR . Normal S1 and S2 without gallops or murmurs.  Abdomen: Bowel sounds are positive, abdomen soft and non-tender  Msk:  Back normal, normal gait. Normal strength and tone for age. Extremities: No clubbing, cyanosis or edema.   Neuro: Alert and oriented X 3. Psych:  Good affect, responds appropriately  TELEMETRY:afib with RVE  ASSESSMENT AND PLAN:    ICD-10-CM   1. NSTEMI (non-ST elevated myocardial infarction) (HCC)  I21.4     2. Acute congestive heart failure, unspecified heart failure type (HCC)  I50.9     3. Atrial fibrillation with RVR (HCC)  I48.91       Principal Problem:   Atrial fibrillation with rapid ventricular response (HCC) Active Problems:   Essential hypertension, benign   Obesity, Class III, BMI 40-49.9 (morbid obesity) (HCC)   PAD (peripheral artery disease) (HCC)   Uncontrolled type 2 diabetes mellitus  with hypoglycemia, with long-term current use of insulin (HCC)   NSTEMI (non-ST elevated myocardial infarction) (HCC)   CAD S/P percutaneous coronary angioplasty   OSA on CPAP   Hx of right BKA (HCC)   History of left-sided carotid endarterectomy   Acute CHF (congestive heart failure) (HCC)   Acute hypercapnic respiratory failure (HCC)   Cardiac arrest [facility])   This is a 63 year old white female with a past medical history of coronary artery disease status post PCI, status post right BKA and left carotid endarterectomy and history of CVA 2021, presented with chest pain shortness of breath and palpitation.  Patient states that she was having burning sensation in her chest associated with severe shortness of breath and palpitation.  She presented to the hospital and was found to be in atrial fibrillation with rapid ventricular response rate.  She had a CTA  chest which revealed no evidence of pulmonary embolism but there was mild interstitial thickening and mild pulmonary edema.  I was asked to evaluate the patient because of new onset atrial fibrillation.  #1. Cardiac Arrest/ Respiratory failure/S/P prolong CPR for 13 minutes, with possible brain injury due to hypoxia   #2,Non-STEMI, peak troponin 1827 with no acute changes on EKG associated with atypical chest pain.  Agree with starting the patient on IV heparin for 48 hours but this could be due to demand ischemia secondary to atrial fibrillation.  However patient does have a history of coronary artery disease but not a candidate for catheterization.  #2 atrial fibrillation with rate 100, had bradycardia/pauses with amiodrone prior to code, so on hold.   Debborah Fairly, MD, [facility] 01/07/2024 12:31 PM

## 2024-01-07 NOTE — Procedures (Signed)
 Patient Name: [name]  MRN: [id number]  Epilepsy Attending: Arleene Lack  Referring Physician/Provider: Domingo Friend, NP  Duration: 01/06/2024 1552 to 01/07/2024 1128  Patient history: 63 yo F s/p cardiac arrest developed persistent eyelid myoclonus which has now resolved. EEG to evaluate for seizure  Level of alertness: comatose  AEDs during EEG study: LEV  Technical aspects: This EEG was obtained using a 10 lead EEG system positioned circumferentially without any parasagittal coverage (rapid EEG). Computer selected EEG is reviewed as  well as background features and all clinically significant events.   Description: Per ICU team, patient was noted to have episodes of brief whole body jerking every 1-2 minutes.  Concomitant EEG showed generalized polyspikes consistent with myoclonic seizures.  In between seizures EEG showed generalized background suppression. Hyperventilation and photic stimulation were not performed.     ABNORMALITY - Myoclonic seizure, generalized - Background suppression, generalized  IMPRESSION: Patient was noted to have myoclonic seizures every 1-2 minutes.  Additionally there was evidence of severe to profound diffuse encephalopathy.  In the setting of cardiac arrest, this EEG pattern is suggestive of anoxic/hypoxic brain injury. Can consider video eeg for more detailed evaluation if needed.   Janey Meek NP was notified.  Ellyn Rubiano O Alexiya Franqui

## 2024-01-07 NOTE — Consult Note (Signed)
 PHARMACY - ANTICOAGULATION CONSULT NOTE  Pharmacy Consult for IV Heparin Indication: chest pain/ACS  Patient Measurements: Height: 5' 5.5" (166.4 cm) Weight: 124.7 kg (274 lb 14.6 oz) IBW/kg (Calculated) : 58.15 HEPARIN DW (KG): 88  Labs: Recent Labs    01/05/24 1547 01/05/24 1832 01/05/24 1839 01/05/24 2256 01/06/24 0431 01/06/24 0854 01/06/24 1028 01/06/24 1549 01/06/24 2327 01/07/24 0258 01/07/24 0516  HGB 9.7*  --   --   --  8.9*  --   --   --   --  10.0* 9.9*  HCT 35.5*  --   --   --  32.9*  --   --   --   --  35.8* 36.0  PLT 450*  --   --   --  417*  --   --   --   --  485* 442*  APTT  --   --  87*  --   --   --   --   --   --   --   --   LABPROT  --   --  17.7*  --   --   --   --   --   --   --   --   INR  --   --  1.4*  --   --   --   --   --   --   --   --   HEPARINUNFRC  --    < >  --  <0.10*  --  0.15*  --   --  <0.10*  --  0.41  CREATININE 1.21*  --   --   --   --   --   --  1.86*  --  1.93*  --   TROPONINIHS 1,367*   < >  --  1,827*  --   --  1,654* 1,467*  --   --   --    < > = values in this interval not displayed.    Estimated Creatinine Clearance: 39.9 mL/min (A) (by C-G formula based on SCr of 1.93 mg/dL (H)).   Medical History: Past Medical History:  Diagnosis Date   Anemia    Anxiety    Arthritis    CHF (congestive heart failure) (HCC)    COPD (chronic obstructive pulmonary disease) (HCC)    Coronary artery disease    Diabetes mellitus without complication (HCC)    Fibromyalgia    GERD (gastroesophageal reflux disease)    Hypertension    Peripheral vascular disease (HCC)    Sleep apnea    Stroke (HCC)     Medications:  No anticoagulation prior to admission per my chart review. Medication reconciliation is pending  Assessment: 63 y/o F with medical history as above presenting to the ED 4/11 with weakness, SOB. Troponin elevated. Pharmacy consulted to manage heparin infusion for ACS.  Baseline aPTT and INR are pending. Baseline CBC  notable for anemia which appears c/w baseline.  4/11:  HL @ 2256 = < 0.1, SUBtherapeutic 4/12 0854 HL 0.15   SUBtherapeutic  1400 >> 1650 4/12 2327 HL < 0.1  SUBtherapeutic  1650 units/hr  4/13 0516 HL 0.41   Therapeutic X 1   Goal of Therapy:  Heparin level 0.3-0.7 units/ml Monitor platelets by anticoagulation protocol: Yes   Plan:  4/12: HL @ 2327 = < 0.1,   SUBtherapeutic - RN stated PIV for heparin drip was not working.  Heparin resumed in alt IV site around 0030.  -  Will order heparin 2600 units IV X 1 bolus but maintain heparin infusion @ 1650 units/hr  4/13:  HL @ 0516 = 0.41, therapeutic X 1  - Will continue pt on current rate and recheck HL in 6 hrs.  - Daily CBC per protocol while on IV heparin  Duran Ohern D Clinical Pharmacist 01/07/2024

## 2024-01-07 NOTE — Progress Notes (Signed)
 Initial Nutrition Assessment  DOCUMENTATION CODES:   Morbid obesity  INTERVENTION:  Initiate tube feeding via NG/OG tube: Vital AF 1.2 at 55 ml/h (1320 ml per day)  Prosource TF20 60mL ml BID  Provides 1744 kcal, 140 gm protein, 1069 ml free water daily **Initiate at 15 mL/hr and titrate by 10mL q4h.  Monitor magnesium, potassium, and phosphorus BID for at least 3 days, MD to replete as needed, as pt is at risk for refeeding syndrome given NPO status. Add Thiamine 100 mg daily for 7 days.  NUTRITION DIAGNOSIS:   Inadequate oral intake related to inability to eat as evidenced by NPO status.  GOAL:   Patient will meet greater than or equal to 90% of their needs  MONITOR:   TF tolerance, Vent status  REASON FOR ASSESSMENT:   Consult Enteral/tube feeding initiation and management  ASSESSMENT:   PMH CAD post PCI, status post right BKA and left carotid endarterectomy and history of CVA 2021, presented with chest pain shortness of breath and palpitation. 4/11- new onset A.fib with RVR.  4/12- intubated during cardiac arrest 4/13- seizures suggestive of anoxic/hypoxic brain injury  Medications reviewed and include: Colace, lasix, Novolog, protonix, Miralax, IVF 100mL/hr, propofol 3.74 mL/hr  Labs reviewed: Phosphorus 5.7 H, Albumin 2.8 L, AST 90 H, ALT 110 H   Intake/Output Summary (Last 24 hours) at 01/07/2024 1250 Last data filed at 01/07/2024 1200 Gross per 24 hour  Intake 2849.29 ml  Output 1050 ml  Net 1799.29 ml    Weights reviewed. Admit weight 117.9 kg.   NUTRITION - FOCUSED PHYSICAL EXAM:  Defer to f/u  Diet Order:   Diet Order     None       EDUCATION NEEDS:   Not appropriate for education at this time  Skin:  Skin Assessment: Skin Integrity Issues: Skin Integrity Issues:: Incisions Incisions: L neck  Last BM:  PTA  Height:   Ht Readings from Last 1 Encounters:  01/06/24 5' 5.5" (1.664 m)    Weight:   Wt Readings from Last 1  Encounters:  01/07/24 124.7 kg    BMI:  Body mass index is 45.05 kg/m.  Estimated Nutritional Needs:   Kcal:  1500-1750  Protein:  130-155  Fluid:  >1.5L or per MD  Laren Player, MPH, RD, LDN Clinical Dietitian Contact information can be found at [facility].

## 2024-01-07 NOTE — Plan of Care (Signed)
 The patient remains on AR-ICU. The patient remains ventilated via ETT. Propofol infusion to assist with controlling myoclonic symptoms. PIV access only. Foley catheter remains in place. Patient remains on Normothermia protocol with [employer] in place. Atrial fib present upon change of shift.    Problem: Education: Goal: Ability to describe self-care measures that may prevent or decrease complications (Diabetes Survival Skills Education) will improve Outcome: Not Progressing Goal: Individualized Educational Video(s) Outcome: Not Progressing   Problem: Coping: Goal: Ability to adjust to condition or change in health will improve Outcome: Not Progressing   Problem: Fluid Volume: Goal: Ability to maintain a balanced intake and output will improve Outcome: Not Progressing   Problem: Health Behavior/Discharge Planning: Goal: Ability to identify and utilize available resources and services will improve Outcome: Not Progressing Goal: Ability to manage health-related needs will improve Outcome: Not Progressing   Problem: Metabolic: Goal: Ability to maintain appropriate glucose levels will improve Outcome: Not Progressing   Problem: Nutritional: Goal: Maintenance of adequate nutrition will improve Outcome: Not Progressing Goal: Progress toward achieving an optimal weight will improve Outcome: Not Progressing   Problem: Skin Integrity: Goal: Risk for impaired skin integrity will decrease Outcome: Not Progressing   Problem: Tissue Perfusion: Goal: Adequacy of tissue perfusion will improve Outcome: Not Progressing   Problem: Education: Goal: Understanding of cardiac disease, CV risk reduction, and recovery process will improve Outcome: Not Progressing Goal: Individualized Educational Video(s) Outcome: Not Progressing   Problem: Activity: Goal: Ability to tolerate increased activity will improve Outcome: Not Progressing   Problem: Cardiac: Goal: Ability to achieve and maintain  adequate cardiovascular perfusion will improve Outcome: Not Progressing   Problem: Health Behavior/Discharge Planning: Goal: Ability to safely manage health-related needs after discharge will improve Outcome: Not Progressing   Problem: Education: Goal: Knowledge of disease or condition will improve Outcome: Not Progressing Goal: Understanding of medication regimen will improve Outcome: Not Progressing Goal: Individualized Educational Video(s) Outcome: Not Progressing   Problem: Activity: Goal: Ability to tolerate increased activity will improve Outcome: Not Progressing   Problem: Cardiac: Goal: Ability to achieve and maintain adequate cardiopulmonary perfusion will improve Outcome: Not Progressing   Problem: Health Behavior/Discharge Planning: Goal: Ability to safely manage health-related needs after discharge will improve Outcome: Not Progressing   Problem: Education: Goal: Knowledge of General Education information will improve Description: Including pain rating scale, medication(s)/side effects and non-pharmacologic comfort measures Outcome: Not Progressing   Problem: Health Behavior/Discharge Planning: Goal: Ability to manage health-related needs will improve Outcome: Not Progressing   Problem: Clinical Measurements: Goal: Ability to maintain clinical measurements within normal limits will improve Outcome: Not Progressing Goal: Will remain free from infection Outcome: Not Progressing Goal: Diagnostic test results will improve Outcome: Not Progressing Goal: Respiratory complications will improve Outcome: Not Progressing Goal: Cardiovascular complication will be avoided Outcome: Not Progressing   Problem: Activity: Goal: Risk for activity intolerance will decrease Outcome: Not Progressing   Problem: Nutrition: Goal: Adequate nutrition will be maintained Outcome: Not Progressing   Problem: Coping: Goal: Level of anxiety will decrease Outcome: Not  Progressing   Problem: Elimination: Goal: Will not experience complications related to bowel motility Outcome: Not Progressing Goal: Will not experience complications related to urinary retention Outcome: Not Progressing   Problem: Pain Managment: Goal: General experience of comfort will improve and/or be controlled Outcome: Not Progressing   Problem: Safety: Goal: Ability to remain free from injury will improve Outcome: Not Progressing   Problem: Skin Integrity: Goal: Risk for impaired skin integrity will decrease Outcome:  Not Progressing   Problem: Activity: Goal: Ability to tolerate increased activity will improve Outcome: Not Progressing   Problem: Respiratory: Goal: Ability to maintain a clear airway and adequate ventilation will improve Outcome: Not Progressing   Problem: Role Relationship: Goal: Method of communication will improve Outcome: Not Progressing   Problem: Education: Goal: Ability to demonstrate management of disease process will improve Outcome: Not Progressing Goal: Ability to verbalize understanding of medication therapies will improve Outcome: Not Progressing Goal: Individualized Educational Video(s) Outcome: Not Progressing   Problem: Activity: Goal: Capacity to carry out activities will improve Outcome: Not Progressing   Problem: Cardiac: Goal: Ability to achieve and maintain adequate cardiopulmonary perfusion will improve Outcome: Not Progressing

## 2024-01-07 NOTE — Progress Notes (Signed)
 PHARMACY CONSULT NOTE - FOLLOW UP  Pharmacy Consult for Electrolyte Monitoring and Replacement   Recent Labs: Potassium (mmol/L)  Date Value  01/07/2024 4.5  01/22/2015 4.2   Magnesium (mg/dL)  Date Value  16/06/9603 2.3  08/09/2014 1.2 (L)   Calcium (mg/dL)  Date Value  54/05/8118 8.4 (L)   Calcium, Total (mg/dL)  Date Value  14/78/2956 9.2   Albumin (g/dL)  Date Value  21/30/8657 2.8 (L)  08/08/2014 3.1 (L)   Phosphorus (mg/dL)  Date Value  84/69/6295 5.7 (H)   Sodium (mmol/L)  Date Value  01/07/2024 136  01/22/2015 137     Assessment: 63 yo female who presented to [facility] ER on 04/11 via EMS from home with c/o weakness and shortness of breath onset 3 days prior to presentation. Pharmacy is asked to follow and replace electrolytes while in CCU  Diuretics: furosemide 20 mg IV q12h  MIVF: sodium bicarbonate 150 mEq in sterile water at 100 mL/hr  Goal of Therapy:  Potassium 4.0 - 5.1 mmol/L Magnesium 20 - 2.4 mg/dL All Other Electrolytes WNL  Plan:  ---no electrolyte replacement warranted for today ---recheck electrolytes in am  Adalberto Acton ,PharmD Clinical Pharmacist 01/07/2024 7:05 AM

## 2024-01-07 NOTE — Progress Notes (Signed)
 NAME:  [name], MRN:  [id number], DOB:  Jan 29, 1961, LOS: 2 ADMISSION DATE:  01/05/2024, CONSULTATION DATE: 01/06/2024 REFERRING MD: Dr. Meyer Ada, CHIEF COMPLAINT: Cardiac Arrest    History of Present Illness:  This is a 63 yo female who presented to [facility] ER on 04/11 via EMS from home with c/o weakness and shortness of breath onset 3 days prior to presentation.   At baseline she wears CPAP at night, but is not on chronic O2.  Per EMS pt found to be in atrial fibrillation with rvr.  She does not have a hx of atrial fibrillation.  She was also hypoglycemic CBG 46, EMS administered glucagon.  However, she remained hypoglycemic and received 1 amp of D50.  CBG stabilized.    ED Course  Upon arrival to the ER initial EKG revealed atrial fibrillation with right bundle branch block, hr 139 but no acute ST elevation or depression present.  She received 1L iv fluid bolus.  Significant lab results were: glucose 58/BUN 27/creatinine 1.21/albumin 3.1/BNP 1,263.5/troponin 1,367/wbc 11.9/hgb 9.7/platelet count 450.  CTA Chest negative for PE, but concerning for interstitial edema.  She received 5 mg of iv metoprolol x2 with no significant improvement in heart rate.  Heparin bolus followed by heparin gtt administered for possible NSTEMI.  She was subsequently admitted to the progressive care unit per hospitalist team for additional workup and treatment, however she remained in the ER pending bed availability.  See detailed hospital course below under significant events.   CTA Chest PE:  No evidence acute pulmonary embolism. RIGHT pleural effusion with RIGHT basilar atelectasis. Mild interstitial thickening at the lung bases suggest interstitial edema. Ill-defined ground-glass densities in the upper lobes. Findings suggest mild pulmonary edema. Benign LEFT adrenal adenoma.  CXR: Detail limited exam without definite plain film evidence of acute process. Enlarged heart.  Pertinent  Medical History  Anemia  Anxiety   Arthritis  COPD CAD Type II Diabetes Mellitus  Fibromyalgia  GERD  HTN  PVD OSA Stroke  CHF   Significant Hospital Events: Including procedures, antibiotic start and stop dates in addition to other pertinent events   04/11: Pt admitted to the progressive care unit with new onset atrial fibrillation with rvr, acute respiratory failure secondary to acute CHF exacerbation, and NSTEMI.   04/12: Pt remained in atrial fibrillation with rvr amiodarone bolus administered followed by amiodarone gtt at 60 mg/hr.  Following administration she became bradycardic and pulseless ACLS protocol initiated with ROSC 13 minutes following initiation of ACLS protocol.  Pt mechanically intubated during cardiac arrest.  Post cardiac arrest pt comatose, unable to follow commands, with no gag/oculocephalic reflexes present despite NOT receiving RSI medications and intermittent eyelid myoclonus present neurology consulted and cerebell initiated 04/13: Pt with worsening myoclonus, cerebell remains in place and noted to have myoclonic seizures every 1-2 minutes suggestive of anoxic/hypoxic brain injury.  Propofol gtt ordered for suppression.   Interim History / Subjective:  As outlined above under significant events   Objective   Blood pressure (!) 146/113, pulse 88, temperature (!) 97.5 F (36.4 C), resp. rate 17, height 5' 5.5" (1.664 m), weight 124.7 kg, SpO2 100%.    Vent Mode: PRVC FiO2 (%):  [40 %-100 %] 40 % Set Rate:  [20 bmp] 20 bmp Vt Set:  [450 mL] 450 mL PEEP:  [5 cmH20-10 cmH20] 10 cmH20 Plateau Pressure:  [23 cmH20] 23 cmH20   Intake/Output Summary (Last 24 hours) at 01/07/2024 1914 Last data filed at 01/07/2024 0700 Gross per  24 hour  Intake 2382.14 ml  Output 870 ml  Net 1512.14 ml   Filed Weights   01/05/24 1540 01/06/24 1539 01/07/24 0400  Weight: 117.9 kg 123.8 kg 124.7 kg   Examination: General: Acutely-ill appearing obese female, NAD mechanically intubated  HENT: Supple, unable  to assess for JVD due to body habitus  Lungs: Faint rhonchi throughout, even, non labored  Cardiovascular: NSR, s1s2, no m/r/g, 2+ radial/1+ left distal pulse, 1+ right femoral pulse, no edema  Abdomen: +BS x4, obese, soft, non distended  Extremities: Right BKA  Neuro: Unresponsive, not following commands or withdrawing from stimulation, no gag/oculocephalic reflexes, severe myoclonus, corneal and cough reflex present, bilateral pupils very sluggish   GU: Indwelling foley catheter present draining yellow urine   Resolved Hospital Problem list     Assessment & Plan:   #Acute metabolic encephalopathy  #Intermittent eyelid myoclonus concerning for 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 Severe chronic small vessel ischemic disease - Correct metabolic derangements  - Maintain RASS goal of 0 to -1 - Propofol for suppression of myoclonus per neuro recommendations  - Avoid sedating medications as able  - Continue scheduled keppra 500 mg q12rs for myoclonus  - Cerebell  - EEG 04/14 - Neurology consulted appreciate input: recommending deferring MRI Brain until 72 hrs post cardiac arrest  - Seizure precautions  - Normothermia protocol  - WUA daily   #Cardiac arrest suspect secondary to amiodarone (asystole) #NSTEMI  #New-onset atrial fibrillation with rvr  #Acute CHF exacerbation  Hx: HTN, PVD, stroke, and CAD  Echo 01/06/2024: EF 35 to 40%; grade II diastolic dysfunction  - Continuous telemetry monitoring  - Will hold antiarrhythmics for now  - Continue heparin gtt: dosing per pharmacy  - Diurese as bp and renal function permits  - Hold antihypertensives for now  - Maintain map 65 or higher  - Continue aspirin and atorvastatin  - Cardiology consulted appreciate input  - Troponin peaked at 1,827 - Free T3/Free T4 pending  #Acute hypoxic respiratory failure  #Mechanical intubation  Hx: COPD and  OSA  - Full vent support for now: vent settings reviewed and established  - Continue lung protective strategies  - Maintain plateau pressures less than 30 cm H2O - VAP bundle implemented - Intermittent CXR's and ABG's - Prn bronchodilator therapy   #Acute kidney injury secondary to ATN  #Severe metabolic acidosis  #Lactic acidosis  - Trend BMP and vbg  - Replace electrolytes as indicated  - Strict I&O's - Avoid nephrotoxic agents   #Transamanitis  - Trend hepatic function panel  - Avoid hepatotoxic agents as able   #Anemia without obvious signs of bleeding  - Trend CBC  - Monitor for s/sx of bleeding  - Transfuse for hgb <7   #Type II diabetes mellitus  #Hypoglycemia~resolved  - CBG's q4hrs  - SSI  - Follow hypo/hyperglycemic protocol  - Target CBG range 140 to 180  Pt with worsening myoclonus cerebell showing myoclonic seizures every 1-2 minutes suggestive of anoxic/hypoxic brain injury.  Pt with poor prognosis recommend changing code status to DO NOT RESUSCITATE   Best Practice (right click and "Reselect all SmartList Selections" daily)   Diet/type: NPO; dietitian consulted for TF's  DVT prophylaxis systemic heparin Pressure ulcer(s): N/A GI prophylaxis: PPI Lines: N/A Foley:  Yes, and it is still needed Code Status:  full code Last date of multidisciplinary goals of care discussion [01/07/2024]  04/13:  Will update pts family when they arrive at bedside.  Labs   CBC: Recent Labs  Lab 01/05/24 1547 01/06/24 0431 01/07/24 0258 01/07/24 0516  WBC 11.9* 12.6* 25.2* 25.5*  NEUTROABS 7.9*  --   --   --   HGB 9.7* 8.9* 10.0* 9.9*  HCT 35.5* 32.9* 35.8* 36.0  MCV 77.9* 76.0* 75.8* 75.2*  PLT 450* 417* 485* 442*    Basic Metabolic Panel: Recent Labs  Lab 01/05/24 1547 01/06/24 1549 01/07/24 0258 01/07/24 0516  NA 141 137 136 136  K 3.7 5.0 4.6 4.5  CL 105 103 100 101  CO2 26 19* 22 23  GLUCOSE 58* 160* 197* 198*  BUN 27* 38* 44* 43*  CREATININE 1.21*  1.86* 1.93* 2.05*  CALCIUM 9.2 8.4* 8.3* 8.4*  MG  --  2.5* 2.7* 2.3  PHOS  --  7.0* 6.0* 5.7*   GFR: Estimated Creatinine Clearance: 37.6 mL/min (A) (by C-G formula based on SCr of 2.05 mg/dL (H)). Recent Labs  Lab 01/05/24 1547 01/06/24 0431 01/06/24 1658 01/07/24 0258 01/07/24 0516  WBC 11.9* 12.6*  --  25.2* 25.5*  LATICACIDVEN  --   --  4.9*  --  2.7*    Liver Function Tests: Recent Labs  Lab 01/05/24 1547 01/06/24 1549 01/07/24 0258 01/07/24 0516  AST 24 101*  --  90*  ALT 26 84*  --  110*  ALKPHOS 95 99  --  101  BILITOT 0.6 1.0  --  0.9  PROT 7.2 6.2*  --  6.4*  ALBUMIN 3.1* 2.7* 2.9* 2.8*   Recent Labs  Lab 01/05/24 1547  LIPASE 21   No results for input(s): "AMMONIA" in the last 168 hours.  ABG    Component Value Date/Time   PHART 7.04 (LL) 01/06/2024 1354   PCO2ART 62 (H) 01/06/2024 1354   PO2ART 116 (H) 01/06/2024 1354   HCO3 24.4 01/06/2024 1925   ACIDBASEDEF 3.8 (H) 01/06/2024 1925   O2SAT 76 01/06/2024 1925     Coagulation Profile: Recent Labs  Lab 01/05/24 1839  INR 1.4*    Cardiac Enzymes: No results for input(s): "CKTOTAL", "CKMB", "CKMBINDEX", "TROPONINI" in the last 168 hours.  HbA1C: Hgb A1c MFr Bld  Date/Time Value Ref Range Status  09/23/2020 07:10 AM 8.6 (H) 4.8 - 5.6 % Final    Comment:    (NOTE) Pre diabetes:          5.7%-6.4%  Diabetes:              >6.4%  Glycemic control for   <7.0% adults with diabetes     CBG: Recent Labs  Lab 01/06/24 1352 01/06/24 1544 01/06/24 1938 01/06/24 2350 01/07/24 0352  GLUCAP 155* 140* 174* 206* 183*    Review of Systems:   Unable to assess pt mechanically intubated   Past Medical History:  She,  has a past medical history of Anemia, Anxiety, Arthritis, CHF (congestive heart failure) (HCC), COPD (chronic obstructive pulmonary disease) (HCC), Coronary artery disease, Diabetes mellitus without complication (HCC), Fibromyalgia, GERD (gastroesophageal reflux disease),  Hypertension, Peripheral vascular disease (HCC), Sleep apnea, and Stroke (HCC).   Surgical History:   Past Surgical History:  Procedure Laterality Date   CESAREAN SECTION     COLONOSCOPY WITH PROPOFOL N/A 08/02/2022   Procedure: COLONOSCOPY WITH PROPOFOL;  Surgeon: Marnee Sink, MD;  Location: [facility] ENDOSCOPY;  Service: Endoscopy;  Laterality: N/A;   CORONARY ANGIOPLASTY WITH STENT PLACEMENT Left    DILATION AND CURETTAGE OF UTERUS  ENDARTERECTOMY Left 01/29/2015   Procedure: ENDARTERECTOMY CAROTID;  Surgeon: Celso College, MD;  Location: [facility] ORS;  Service: Vascular;  Laterality: Left;   LOWER EXTREMITY ANGIOGRAPHY Left 09/24/2020   Procedure: Lower Extremity Angiography;  Surgeon: Celso College, MD;  Location: [facility] INVASIVE CV LAB;  Service: Cardiovascular;  Laterality: Left;   PERIPHERAL VASCULAR CATHETERIZATION Right 05/27/2015   Procedure: Lower Extremity Angiography;  Surgeon: Celso College, MD;  Location: [facility] INVASIVE CV LAB;  Service: Cardiovascular;  Laterality: Right;   PERIPHERAL VASCULAR CATHETERIZATION  05/27/2015   Procedure: Lower Extremity Intervention;  Surgeon: Celso College, MD;  Location: [facility] INVASIVE CV LAB;  Service: Cardiovascular;;   PERIPHERAL VASCULAR CATHETERIZATION Right 09/22/2016   Procedure: Lower Extremity Angiography;  Surgeon: Celso College, MD;  Location: [facility] INVASIVE CV LAB;  Service: Cardiovascular;  Laterality: Right;   TUBAL LIGATION       Social History:   reports that she quit smoking about 7 years ago. Her smoking use included cigarettes. She started smoking about 45 years ago. She has a 9.5 pack-year smoking history. She uses smokeless tobacco. She reports that she does not drink alcohol and does not use drugs.   Family History:  Her family history is not on file.   Allergies Allergies  Allergen Reactions   Niacin And Related Nausea And Vomiting     Home Medications  Prior to Admission medications   Medication Sig Start Date End Date Taking?  Authorizing Provider  atorvastatin (LIPITOR) 40 MG tablet Take 40 mg by mouth every evening.   Yes [provider]  buPROPion (WELLBUTRIN XL) 300 MG 24 hr tablet Take 300 mg by mouth daily. Pt taking 300 mg by mouth daily   Yes [provider]  cetirizine (ZYRTEC) 10 MG tablet Take 10 mg by mouth daily.   Yes [provider]  DULoxetine (CYMBALTA) 60 MG capsule Take 60 mg by mouth daily.   Yes [provider]  enalapril (VASOTEC) 5 MG tablet Take 5 mg by mouth daily.   Yes [provider]  HUMULIN R U-500 KWIKPEN 500 UNIT/ML kwikpen Inject 40-80 Units into the skin See admin instructions. Inject 80u under the skin every morning before breakfast and inject 40u under the skin every evening before supper   Yes [provider]  metFORMIN (GLUCOPHAGE-XR) 500 MG 24 hr tablet Take 500 mg by mouth 2 (two) times daily with a meal.   Yes [provider]  OLANZapine (ZYPREXA) 5 MG tablet Take 5 mg by mouth daily. 02/11/21  Yes [provider]  oxybutynin (DITROPAN-XL) 10 MG 24 hr tablet Take 10 mg by mouth daily.   Yes [provider]  pantoprazole (PROTONIX) 40 MG tablet Take 40 mg by mouth daily.   Yes [provider]  traZODone (DESYREL) 50 MG tablet Take 100 mg by mouth at bedtime.   Yes [provider]  collagenase (SANTYL) ointment Apply topically daily. Patient not taking: Reported on 01/05/2024 09/26/20   Alphonsus Jeans, MD  Dulaglutide (TRULICITY) 0.75 MG/0.5ML SOPN Inject 0.75 mg into the skin every Tuesday. Patient not taking: Reported on 06/24/2022    [provider]  gabapentin (NEURONTIN) 300 MG capsule Take 300-600 mg by mouth at bedtime. Patient not taking: Reported on 01/05/2024    [provider]     Critical care time: 35 minutes       Janey Meek, AGNP  Pulmonary/Critical Care Pager [phone] (please enter 7 digits) PCCM Consult Pager [phone] (  please enter 7  digits)

## 2024-01-07 NOTE — Consult Note (Addendum)
 Consultation Note Date: 01/07/2024 at 1545  Patient Name: [name]  DOB: Jan 18, 1961  MRN: [id number]  Age / Sex: 63 y.o., female  PCP: Ardia Kraft, NP Referring Physician: Cleve Dale, MD  HPI/Patient Profile: 63 y.o. female  with past medical history of HTN, DM2, HLD, CAD s/p PCI, PAD s/p right BKA and left carotid endarterectomy, prior CVA (2021), morbid obesity with OSA on CPAP, COPD and left sided diabetic hemichorea. She presented to ED 01/05/24 via EMS c/o SHOB, orthopnea and fatigue x 3 days with an episode of heartburn 1 day prior. EMS found patient to be in A-fib with rate of 140-180 and CBG of 46. She received glucagon en route. In the ED, she was found to be hypoxic 91% with HR 120-130's. ED workup showed: Troponin 1367-->1686, BNP 1263, CBC notable for WBC of 12,000 and hemoglobin 9.7 (most recent baseline 11.3 a year ago) Creatinine 1.21, near baseline of 1.14 Respiratory viral panel negative for COVID flu and RSV EKG with A-fib at 139 and RBBB CTA chest negative for PE with mild interstitial thickening at lung bases suggesting pulmonary edema and benign left adrenal adenoma.  She was admitted to the progressive care unit for management of new onset A-fib with RVR, acute CHF exacerbation and NSTEMI. She remained in ED waiting for progressive bed to open.  On 4/12, while continuing to wait for bed, she was found to be bradycardic 20's-30's and became unresponsive. ACLS was initiated by ED staff. Initial rhythm was asystole. Patient received 4 doses of epi and achieved after 13 minutes. She was intubated and transferred to ICU.  Pt continues to have worsening myoclonus cerebell showing myoclonic seizures every 1-2 minutes suggestive of anoxic/hypoxic brain injury with poor prognosis.   PMT consulted for goals of care  Clinical Assessment and Goals of Care: Extensive chart review completed prior  to meeting patient including labs, vital signs, imaging, progress notes, orders, and available advanced directive documents from current and previous encounters. I then met with patients family  to discuss diagnosis prognosis, GOC, EOL wishes, disposition and options.  I introduced Palliative Medicine as specialized medical care for people living with serious illness. It focuses on providing relief from the symptoms and stress of a serious illness. The goal is to improve quality of life for both the patient and the family.  Ill-appearing obese female on mechanical ventilation. She has no significant response to tactile of verbal stimuli. Family at bedside.   Met in ICU family consult room with [place], daughter, Gearold Keller and Allison Ivory, family friends and Gaetana Jones, stepfather. [place] and [place], nieces, attended via phone.   We discussed a brief life review of the patient. Truc has always worked in [employer]. Adele Admire states she is a miracle child because her mother did not have her until she was 70 years old.  Noah Baston shares that he and Mazell are not married but have been together for 13 years.   We discussed patient's current illness and what it means in the  larger context of patient's on-going co-morbidities.  Natural disease trajectory and expectations at EOL were discussed. Family understands that patient is critically ill with multiorgan failure and at high risk for cardiac arrest and death.   I attempted to elicit values and goals of care important to the patient.  Buddy states that he had conversation with Tifany who had previously advised if something were to happen, not to let her go.   The difference between aggressive medical intervention and comfort care was considered in light of the patient's goals of care.   Advance directives, concepts specific to code status, artificial feeding and hydration were considered and discussed. Family verbalizes understanding that patient has  poor prognosis with recommendation for DNR. At this time, daughter was not ready to make a decision on code status. Family wants patient to remain a FULL CODE at this time.   Education offered regarding concept specific to human mortality and the limitations of medical interventions to prolong life when the body begins to fail to thrive.  Family is facing treatment option decisions, advanced directive, and anticipatory care needs.  Family expresses they want to allow time for outcomes and see what the results of the MRI planned for Wednesday shows to help them make decisions.    Discussed with patient/family the importance of continued conversation with family and the medical providers regarding overall plan of care and treatment options, ensuring decisions are within the context of the patient's values and GOCs.    Questions and concerns were addressed. The family was encouraged to call with questions or concerns.   Primary Decision Maker NEXT OF KIN- Hattie- Daughter  Physical Exam Constitutional:      General: She is not in acute distress.    Appearance: She is obese. She is ill-appearing.  HENT:     Mouth/Throat:     Comments: OG in place Pulmonary:     Comments: Mechanically ventilated Musculoskeletal:     Comments: R BKA  Skin:    General: Skin is warm and dry.     Recommendations/Plan: Full Code Continue current supportive interventions Allowing time for outcomes Ongoing conversations between PMT and family   Palliative Assessment/Data: 10%     Thank you for this consult. Palliative medicine will continue to follow and assist holistically.   Time Total: 90 minutes  Time spent includes: Detailed review of medical records (labs, imaging, vital signs), medically appropriate exam (mental status, respiratory, cardiac, skin), discussed with treatment team, counseling and educating patient, family and staff, documenting clinical information, medication management and  coordination of care.     Ina Manas, Joyice Nodal [facility] Palliative Medicine Team  01/07/2024 4:50 PM  Office [phone]  Pager [phone]     Please contact Palliative Medicine Team providers via AMION for questions and concerns.

## 2024-01-08 ENCOUNTER — Inpatient Hospital Stay

## 2024-01-08 DIAGNOSIS — Z515 Encounter for palliative care: Secondary | ICD-10-CM | POA: Diagnosis not present

## 2024-01-08 DIAGNOSIS — G9341 Metabolic encephalopathy: Secondary | ICD-10-CM | POA: Diagnosis not present

## 2024-01-08 DIAGNOSIS — I469 Cardiac arrest, cause unspecified: Secondary | ICD-10-CM | POA: Diagnosis not present

## 2024-01-08 DIAGNOSIS — I5041 Acute combined systolic (congestive) and diastolic (congestive) heart failure: Secondary | ICD-10-CM | POA: Diagnosis not present

## 2024-01-08 DIAGNOSIS — I509 Heart failure, unspecified: Secondary | ICD-10-CM

## 2024-01-08 DIAGNOSIS — R569 Unspecified convulsions: Secondary | ICD-10-CM | POA: Diagnosis not present

## 2024-01-08 DIAGNOSIS — I4891 Unspecified atrial fibrillation: Secondary | ICD-10-CM

## 2024-01-08 DIAGNOSIS — I214 Non-ST elevation (NSTEMI) myocardial infarction: Secondary | ICD-10-CM

## 2024-01-08 DIAGNOSIS — Z7189 Other specified counseling: Secondary | ICD-10-CM | POA: Diagnosis not present

## 2024-01-08 DIAGNOSIS — J9601 Acute respiratory failure with hypoxia: Secondary | ICD-10-CM | POA: Diagnosis not present

## 2024-01-08 DIAGNOSIS — G253 Myoclonus: Secondary | ICD-10-CM | POA: Diagnosis not present

## 2024-01-08 DIAGNOSIS — Z789 Other specified health status: Secondary | ICD-10-CM | POA: Diagnosis not present

## 2024-01-08 LAB — CBC
HCT: 33.7 % — ABNORMAL LOW (ref 36.0–46.0)
Hemoglobin: 9.3 g/dL — ABNORMAL LOW (ref 12.0–15.0)
MCH: 20.7 pg — ABNORMAL LOW (ref 26.0–34.0)
MCHC: 27.6 g/dL — ABNORMAL LOW (ref 30.0–36.0)
MCV: 74.9 fL — ABNORMAL LOW (ref 80.0–100.0)
Platelets: 317 10*3/uL (ref 150–400)
RBC: 4.5 MIL/uL (ref 3.87–5.11)
RDW: 17.9 % — ABNORMAL HIGH (ref 11.5–15.5)
WBC: 26.2 10*3/uL — ABNORMAL HIGH (ref 4.0–10.5)
nRBC: 0.2 % (ref 0.0–0.2)

## 2024-01-08 LAB — TRIGLYCERIDES: Triglycerides: 154 mg/dL — ABNORMAL HIGH (ref <150)

## 2024-01-08 LAB — BASIC METABOLIC PANEL WITH GFR
Anion gap: 11 (ref 5–15)
BUN: 45 mg/dL — ABNORMAL HIGH (ref 8–23)
CO2: 31 mmol/L (ref 22–32)
Calcium: 7.9 mg/dL — ABNORMAL LOW (ref 8.9–10.3)
Chloride: 98 mmol/L (ref 98–111)
Creatinine, Ser: 1.58 mg/dL — ABNORMAL HIGH (ref 0.44–1.00)
GFR, Estimated: 37 mL/min — ABNORMAL LOW (ref >60)
Glucose, Bld: 229 mg/dL — ABNORMAL HIGH (ref 70–99)
Potassium: 3.4 mmol/L — ABNORMAL LOW (ref 3.5–5.1)
Sodium: 140 mmol/L (ref 135–145)

## 2024-01-08 LAB — GLUCOSE, CAPILLARY
Glucose-Capillary: 215 mg/dL — ABNORMAL HIGH (ref 70–99)
Glucose-Capillary: 247 mg/dL — ABNORMAL HIGH (ref 70–99)
Glucose-Capillary: 258 mg/dL — ABNORMAL HIGH (ref 70–99)
Glucose-Capillary: 260 mg/dL — ABNORMAL HIGH (ref 70–99)
Glucose-Capillary: 263 mg/dL — ABNORMAL HIGH (ref 70–99)

## 2024-01-08 LAB — HEPARIN LEVEL (UNFRACTIONATED): Heparin Unfractionated: 0.3 [IU]/mL (ref 0.30–0.70)

## 2024-01-08 LAB — HEMOGLOBIN A1C
Hgb A1c MFr Bld: 7.2 % — ABNORMAL HIGH (ref 4.8–5.6)
Mean Plasma Glucose: 160 mg/dL

## 2024-01-08 LAB — MAGNESIUM: Magnesium: 2.3 mg/dL (ref 1.7–2.4)

## 2024-01-08 LAB — T3, FREE: T3, Free: 2.4 pg/mL (ref 2.0–4.4)

## 2024-01-08 LAB — PHOSPHORUS: Phosphorus: 3.7 mg/dL (ref 2.5–4.6)

## 2024-01-08 LAB — LIPOPROTEIN A (LPA): Lipoprotein (a): 353.4 nmol/L — ABNORMAL HIGH (ref <75.0)

## 2024-01-08 MED ORDER — POTASSIUM CHLORIDE 20 MEQ PO PACK
40.0000 meq | PACK | Freq: Once | ORAL | Status: AC
  Administered 2024-01-08: 40 meq
  Filled 2024-01-08: qty 2

## 2024-01-08 MED ORDER — ATORVASTATIN CALCIUM 20 MG PO TABS
40.0000 mg | ORAL_TABLET | Freq: Every day | ORAL | Status: DC
  Administered 2024-01-08 – 2024-01-11 (×4): 40 mg
  Filled 2024-01-08 (×4): qty 2

## 2024-01-08 MED ORDER — INSULIN ASPART 100 UNIT/ML IJ SOLN
4.0000 [IU] | INTRAMUSCULAR | Status: DC
  Administered 2024-01-08 – 2024-01-09 (×5): 4 [IU] via SUBCUTANEOUS
  Filled 2024-01-08 (×5): qty 1

## 2024-01-08 MED ORDER — ASPIRIN 81 MG PO CHEW
81.0000 mg | CHEWABLE_TABLET | Freq: Every day | ORAL | Status: DC
  Administered 2024-01-08 – 2024-01-11 (×4): 81 mg
  Filled 2024-01-08 (×4): qty 1

## 2024-01-08 MED ORDER — LEVETIRACETAM IN NACL 1000 MG/100ML IV SOLN
1000.0000 mg | Freq: Two times a day (BID) | INTRAVENOUS | Status: DC
  Administered 2024-01-08 – 2024-01-10 (×4): 1000 mg via INTRAVENOUS
  Filled 2024-01-08 (×5): qty 100

## 2024-01-08 MED ORDER — FENTANYL CITRATE PF 50 MCG/ML IJ SOSY
50.0000 ug | PREFILLED_SYRINGE | INTRAMUSCULAR | Status: AC | PRN
  Administered 2024-01-08 (×3): 50 ug via INTRAVENOUS
  Filled 2024-01-08 (×3): qty 1

## 2024-01-08 MED ORDER — FENTANYL CITRATE PF 50 MCG/ML IJ SOSY
50.0000 ug | PREFILLED_SYRINGE | INTRAMUSCULAR | Status: DC | PRN
  Administered 2024-01-09 – 2024-01-11 (×2): 100 ug via INTRAVENOUS
  Filled 2024-01-08 (×2): qty 2

## 2024-01-08 MED ORDER — FREE WATER
30.0000 mL | Status: DC
  Administered 2024-01-08 – 2024-01-12 (×22): 30 mL

## 2024-01-08 MED ORDER — VITAL AF 1.2 CAL PO LIQD
1000.0000 mL | ORAL | Status: DC
  Administered 2024-01-08 – 2024-01-11 (×4): 1000 mL

## 2024-01-08 NOTE — Progress Notes (Signed)
 PHARMACY CONSULT NOTE  Pharmacy Consult for Electrolyte Monitoring and Replacement   Recent Labs: Potassium (mmol/L)  Date Value  01/08/2024 3.4 (L)  01/22/2015 4.2   Magnesium (mg/dL)  Date Value  69/62/9528 2.3  08/09/2014 1.2 (L)   Calcium (mg/dL)  Date Value  41/32/4401 7.9 (L)   Calcium, Total (mg/dL)  Date Value  02/72/5366 9.2   Albumin (g/dL)  Date Value  44/11/4740 2.8 (L)  08/08/2014 3.1 (L)   Phosphorus (mg/dL)  Date Value  59/56/3875 3.7   Sodium (mmol/L)  Date Value  01/08/2024 140  01/22/2015 137   Assessment: 64 yo female who presented to [facility] ER on 04/11 via EMS from home with c/o weakness and shortness of breath onset 3 days prior to presentation. Pharmacy is asked to follow and replace electrolytes while in CCU  Diuretics: furosemide 20 mg IV q12h  MIVF: sodium bicarbonate 150 mEq in sterile water at 100 mL/hr  Goal of Therapy:  Potassium 4.0 - 5.1 mmol/L Magnesium 20 - 2.4 mg/dL All Other Electrolytes WNL  Plan:  --K 3.4, Kcl 40 mEq x 1 dose ordered by PCCM. Will order additional 40 mEq this evening with on-going Lasix --Re-check electrolytes in AM  Page Boast 01/08/2024 7:26 AM

## 2024-01-08 NOTE — Progress Notes (Signed)
 Eeg done

## 2024-01-08 NOTE — Inpatient Diabetes Management (Signed)
 Inpatient Diabetes Program Recommendations  AACE/ADA: New Consensus Statement on Inpatient Glycemic Control (2015)  Target Ranges:  Prepandial:   less than 140 mg/dL      Peak postprandial:   less than 180 mg/dL (1-2 hours)      Critically ill patients:  140 - 180 mg/dL    Latest Reference Range & Units 01/05/24 22:56  Hemoglobin A1C 4.8 - 5.6 % 7.2 (H)  (H): Data is abnormally high  Latest Reference Range & Units 01/06/24 23:50 01/07/24 03:52 01/07/24 07:50 01/07/24 11:14 01/07/24 16:03 01/07/24 19:28  Glucose-Capillary 70 - 99 mg/dL 161 (H) 096 (H) 045 (H) 207 (H) 184 (H) 182 (H)  (H): Data is abnormally high  Latest Reference Range & Units 01/07/24 23:43 01/08/24 04:57 01/08/24 07:37  Glucose-Capillary 70 - 99 mg/dL 409 (H)  5 units Novolog  215 (H)  5 units Novolog  247 (H)  5 units Novolog   (H): Data is abnormally high    Admit with:  AFib with RVR/ NSTEMI/ Acute hypercapnic respiratory failure/ Hypoxia/ Acute CHF Suffered Cardiac Arrest in the ED  History: DM, CVA  Home DM Meds: Trulicity 0.75 mg Qweek (NOT taking)        Humulin R U-500 Insulin 80 units AM/ 40 units PM        Metformin 500 mg BID  Current Orders: Novolog Moderate Correction Scale/ SSI (0-15 units) Q4 hours    MD- Note tube feeds started yesterday afternoon   Pt takes Concentrated U500 Insulin at home--Not appropriate to start at this time  CBGs now >200  Please consider starting Novolog Tube Feed Coverage:  Novolog 4 units Q4 hours HOLD if tube feeds HELD for any reason    --Will follow patient during hospitalization--  Langston Pippins RN, MSN, CDCES Diabetes Coordinator Inpatient Glycemic Control Team Team Pager: [phone] (8a-5p)

## 2024-01-08 NOTE — Plan of Care (Signed)
 No evidence of seizure activity on routine EEG Continue current care Will follow tomorrow.   -- Tona Francis, MD Neurologist [facility]

## 2024-01-08 NOTE — Procedures (Signed)
 Patient Name: [name]  MRN: [id number]  Epilepsy Attending: Arleene Lack  Referring Physician/Provider: Domingo Friend, NP  Date: 01/08/2024 Duration: 29.28 mins  Patient history:  63 yo F s/p cardiac arrest developed persistent eyelid myoclonus which has now resolved. EEG to evaluate for seizure  Level of alertness:  comatose  AEDs during EEG study: LEV, propofol  Technical aspects: This EEG study was done with scalp electrodes positioned according to the 10-20 International system of electrode placement. Electrical activity was reviewed with band pass filter of 1-70Hz , sensitivity of 7 uV/mm, display speed of 58mm/sec with a 60Hz  notched filter applied as appropriate. EEG data were recorded continuously and digitally stored.  Video monitoring was available and reviewed as appropriate.  Description: EEG showed continuous generalized and maximal bifrontal polymorphic sharply contoured 3 to 6 Hz theta-delta slowing. Hyperventilation and photic stimulation were not performed.     ABNORMALITY - Continuous slow, generalized and maximal bifrontal   IMPRESSION: This study is suggestive of cortical dysfunction arising from bifrontal region likely secondary to underlying structural abnormality. Additionally there is moderate to severe diffuse encephalopathy. No seizures or epileptiform discharges were seen throughout the recording.  Avonda Toso O Deklin Bieler

## 2024-01-08 NOTE — Progress Notes (Signed)
 Nutrition Follow-up  DOCUMENTATION CODES:   Morbid obesity  INTERVENTION:   Vital 1.2@50ml /hr + ProSource TF 20- Give 60ml BID via tube  Free water flushes 30ml q4 hours to maintain tube patency   Regimen provides 1600kcal/day, 130g/day of protein and 1123ml/day of free water.   Pt at high refeed risk; recommend monitor potassium, magnesium and phosphorus labs daily until stable  Thiamine 100mg  daily via tube x 7 days  Daily weights   NUTRITION DIAGNOSIS:   Inadequate oral intake related to inability to eat as evidenced by NPO status. -ongoing   GOAL:   Provide needs based on ASPEN/SCCM guidelines -met   MONITOR:   Vent status, Labs, Weight trends, TF tolerance, I & O's, Skin  ASSESSMENT:   63 y/o female with h/o HTN, PAD s/p left-sided carotid endarterectomy, DM, R BKA, CVA, CAD, HLD, GERD and COPD who is admitted with Afib with RVR, NSTEMI, CHF, cardiac arrest, AKI and concerns for ABI.  Pt sedated and ventilated. OGT in place. Pt is tolerating tube feeds at goal rate. Pt is actively refeeding; electrolytes are being monitored and supplemented. Per chart, pt is up ~8lbs since admission. Pt on her I & Os.   Medications reviewed and include: aspirin, colace, lasix, insulin, protonix, miralax, KCl, thiamine, heparin, zosyn, propofol   Labs reviewed: K 3.4(L), BUN 45(H), creat 1.58(H), P 3.7 wnl, Mg 2.3 wnl BNP- 750.8(H)- 4/12 Wbc- 26.2(H), Hgb 9.3(L), Hct 33.7(L), MCV 74.9(L), MCH 20.7(L), MCHC 27.6(L) Cbgs- 258, 247, 215 x 24 hrs  AIC 7.2(H)- 4/11  Patient is currently intubated on ventilator support MV: 9.1 L/min Temp (24hrs), Avg:98.7 F (37.1 C), Min:98.1 F (36.7 C), Max:99 F (37.2 C)  Propofol: 11.22 ml/hr- provides 296kcal/day   MAP >48mmHg   UOP-   NUTRITION - FOCUSED PHYSICAL EXAM:  Flowsheet Row Most Recent Value  Orbital Region No depletion  Upper Arm Region No depletion  Thoracic and Lumbar Region No depletion  Buccal Region  No depletion  Temple Region No depletion  Clavicle Bone Region No depletion  Clavicle and Acromion Bone Region No depletion  Scapular Bone Region No depletion  Dorsal Hand No depletion  Patellar Region No depletion  Anterior Thigh Region No depletion  Posterior Calf Region No depletion  Edema (RD Assessment) Mild  Hair Reviewed  Eyes Reviewed  Mouth Reviewed  Skin Reviewed  Nails Reviewed   Diet Order:   Diet Order     None      EDUCATION NEEDS:   Not appropriate for education at this time  Skin:  Skin Assessment: Reviewed RN Assessment  Last BM:  PTA  Height:   Ht Readings from Last 1 Encounters:  01/06/24 5' 5.5" (1.664 m)    Weight:   Wt Readings from Last 1 Encounters:  01/08/24 127.2 kg   BMI:  Body mass index is 45.96 kg/m.  Estimated Nutritional Needs:   Kcal:  1300-1500kcal/day  Protein:  130-145g/day  Fluid:  1.8-2.1L/day  Torrance Freestone MS, RD, LDN If unable to be reached, please send secure chat to "RD inpatient" available from 8:00a-4:00p daily

## 2024-01-08 NOTE — Progress Notes (Signed)
 [facility] ADULT ICU REPLACEMENT PROTOCOL   The patient does apply for the [facility] Adult ICU Electrolyte Replacment Protocol based on the criteria listed below:   1.Exclusion criteria: TCTS, ECMO, Dialysis, and Myasthenia Gravis patients 2. Is GFR >/= 30 ml/min? Yes.    Patient's GFR today is 37 3. Is SCr </= 2? Yes.   Patient's SCr is 1.58 mg/dL 4. Did SCr increase >/= 0.5 in 24 hours? No. 5.Pt's weight >40kg  Yes.   6. Abnormal electrolyte(s): K  7. Electrolytes replaced per protocol 8.  Call MD STAT for K+ </= 2.5, Phos </= 1, or Mag </= 1 Physician:  Rowland Copas Chrystle Murillo 01/08/2024 5:15 AM

## 2024-01-08 NOTE — Progress Notes (Signed)
 [facility] CLINIC CARDIOLOGY PROGRESS NOTE   Patient ID: [name] MRN: [id number] DOB/AGE: 1960/09/29 63 y.o.  Admit date: 01/05/2024 Referring Physician Dr. Para March Primary Physician Sandrea Hughs, NP  Primary Cardiologist None Reason for Consultation AF RVR  HPI: [name] is a 63 y.o. female with a past medical history of coronary artery disease s/p PCI, OSA on CPAP, COPD, PAD s/p right BKA, left carotid endarterectomy , prior CVA (2021) who presented to the ED on 01/05/2024 for shortness of breath and fatigue. Patient was found to have new onset atrial fibrillation with RVR. Cardiology was consulted.  Interval History:  -Patient is intubated and on propofol gtt s/p cardiac arrest (04/13) with ROSC in 13 minutes.  -In sinus rhythm with improved HR. BP is elevated. -Cr is improving 2.05 > 1.58 with stable electrolytes.  -Hgb 9.3 today and stable.  -Notable, elevated LPA 353  Review of systems complete and found to be negative unless listed above    Vitals:   01/08/24 0600 01/08/24 0700 01/08/24 0755 01/08/24 0800  BP: 135/77 138/86  (!) 150/91  Pulse: 94 95    Resp: (!) 25 20  (!) 22  Temp: 98.8 F (37.1 C) 99 F (37.2 C)  (P) 98.8 F (37.1 C)  TempSrc:    (P) Bladder  SpO2: 97% 99% 97% (P) 98%  Weight:      Height:         Intake/Output Summary (Last 24 hours) at 01/08/2024 1017 Last data filed at 01/08/2024 0700 Gross per 24 hour  Intake 3951.25 ml  Output 3685 ml  Net 266.25 ml     PHYSICAL EXAM General: Chronically ill appearing female, well nourished, in no acute distress. HEENT: Normocephalic and atraumatic. Neck: No JVD.  Lungs: Intubated. Mechanical breath sounds. Heart: HRRR. Normal S1 and S2 without gallops or murmurs. Radial & DP pulses 2+ bilaterally. Abdomen: Non-distended appearing.  Msk: Normal strength and tone for age. Extremities: No clubbing, cyanosis or edema. Right BKA. Neuro: Alert and oriented X 3. Psych: Mood appropriate, affect  congruent.    LABS: Basic Metabolic Panel: Recent Labs    01/07/24 0516 01/08/24 0412  NA 136 140  K 4.5 3.4*  CL 101 98  CO2 23 31  GLUCOSE 198* 229*  BUN 43* 45*  CREATININE 2.05* 1.58*  CALCIUM 8.4* 7.9*  MG 2.3 2.3  PHOS 5.7* 3.7   Liver Function Tests: Recent Labs    01/06/24 1549 01/07/24 0258 01/07/24 0516  AST 101*  --  90*  ALT 84*  --  110*  ALKPHOS 99  --  101  BILITOT 1.0  --  0.9  PROT 6.2*  --  6.4*  ALBUMIN 2.7* 2.9* 2.8*   Recent Labs    01/05/24 1547  LIPASE 21   CBC: Recent Labs    01/05/24 1547 01/06/24 0431 01/07/24 0516 01/08/24 0412  WBC 11.9*   < > 25.5* 26.2*  NEUTROABS 7.9*  --   --   --   HGB 9.7*   < > 9.9* 9.3*  HCT 35.5*   < > 36.0 33.7*  MCV 77.9*   < > 75.2* 74.9*  PLT 450*   < > 442* 317   < > = values in this interval not displayed.   Cardiac Enzymes: Recent Labs    01/06/24 1028 01/06/24 1549 01/07/24 0516  TROPONINIHS 1,654* 1,467* 847*   BNP: Recent Labs    01/05/24 1547 01/06/24 1549  BNP 1,263.5* 750.8*  D-Dimer: No results for input(s): "DDIMER" in the last 72 hours. Hemoglobin A1C: Recent Labs    01/05/24 2256  HGBA1C 7.2*   Fasting Lipid Panel: Recent Labs    01/08/24 0412  TRIG 154*   Thyroid Function Tests: Recent Labs    01/05/24 2256  TSH 2.618   Anemia Panel: No results for input(s): "VITAMINB12", "FOLATE", "FERRITIN", "TIBC", "IRON", "RETICCTPCT" in the last 72 hours.  Rapid EEG Result Date: 01/07/2024 Arleene Lack, MD     01/08/2024 10:11 AM Patient Name: [name] MRN: [id number] Epilepsy Attending: Arleene Lack Referring Physician/Provider: Domingo Friend, NP Duration: 01/06/2024 1552 to 01/07/2024 1128 Patient history: 63 yo F s/p cardiac arrest developed persistent eyelid myoclonus which has now resolved. EEG to evaluate for seizure Level of alertness: comatose AEDs during EEG study: LEV Technical aspects: This EEG was obtained using a 10 lead EEG system positioned  circumferentially without any parasagittal coverage (rapid EEG). Computer selected EEG is reviewed as  well as background features and all clinically significant events. Description: Per ICU team, patient was noted to have episodes of brief whole body jerking every 1-2 minutes.  Concomitant EEG showed generalized polyspikes consistent with myoclonic seizures.  In between seizures EEG showed generalized background suppression. Hyperventilation and photic stimulation were not performed.   ABNORMALITY - Myoclonic seizure, generalized - Background suppression, generalized IMPRESSION: Patient was noted to have myoclonic seizures every 1-2 minutes.  Additionally there was evidence of severe to profound diffuse encephalopathy.  In the setting of cardiac arrest, this EEG pattern is suggestive of anoxic/hypoxic brain injury. Can consider video eeg for more detailed evaluation if needed. Janey Meek NP was notified. Priyanka Suzanne Erps   CT HEAD WO CONTRAST ( ) Result Date: 01/06/2024 CLINICAL DATA:  Mental status change, unknown cause. EXAM: CT HEAD WITHOUT CONTRAST TECHNIQUE: Contiguous axial images were obtained from the base of the skull through the vertex without intravenous contrast. RADIATION DOSE REDUCTION: This exam was performed according to the departmental dose-optimization program which includes automated exposure control, adjustment of the mA and/or kV according to patient size and/or use of iterative reconstruction technique. COMPARISON:  Head CT and MRI 08/11/2022 FINDINGS: Brain: There is no evidence of an acute infarct, intracranial hemorrhage, mass, midline shift, or extra-axial fluid collection. Confluent hypodensities in the cerebral white matter bilaterally are similar to the prior CT and are nonspecific but compatible with severe chronic small vessel ischemic disease. Small chronic infarcts are again seen in the deep cerebral white matter and left cerebellar hemisphere. There is moderate cerebral  atrophy. Vascular: Calcified atherosclerosis at the skull base. No hyperdense vessel. Skull: No acute fracture or suspicious lesion. Sinuses/Orbits: Visualized paranasal sinuses are clear. Small chronic left mastoid effusion. Unremarkable orbits. Other: None. IMPRESSION: 1. No evidence of acute intracranial abnormality. 2. Severe chronic small vessel ischemic disease. Electronically Signed   By: Aundra Lee M.D.   On: 01/06/2024 16:22   DG Chest Portable 1 View Result Date: 01/06/2024 CLINICAL DATA:  Post intubation EXAM: PORTABLE CHEST 1 VIEW COMPARISON:  None Available. FINDINGS: Detail limited by overlying leads. An endotracheal tube is present which likely terminates approximately 2 cm above the carina. Bibasilar hypoventilatory changes. No pleural effusion or pneumothorax. IMPRESSION: 1. Satisfactory appearance of endotracheal tube. 2. No pneumothorax. Electronically Signed   By: Reagan Camera M.D.   On: 01/06/2024 14:11   DG Abdomen 1 View Result Date: 01/06/2024 CLINICAL DATA:  G-tube placement EXAM: ABDOMEN - 1 VIEW COMPARISON:  None Available.  FINDINGS: An enteric tube is present which terminates into spit location the stomach. Numerous leads overlie the patient. Nonspecific bowel gas pattern. IMPRESSION: 1. An enteric tube is present which likely terminates in the stomach. Electronically Signed   By: Reagan Camera M.D.   On: 01/06/2024 14:10   ECHOCARDIOGRAM COMPLETE Result Date: 01/06/2024    ECHOCARDIOGRAM REPORT   Patient Name:   [name] Date of Exam: 01/06/2024 Medical Rec #:  [id number]     Height:       65.5 in Accession #:    [id number]    Weight:       260.0 lb Date of Birth:  Feb 12, 1961      BSA:          2.224 m Patient Age:    62 years      BP:           148/85 mmHg Patient Gender: F             HR:           83 bpm. Exam Location:  [facility] Procedure: 2D Echo, Cardiac Doppler and Color Doppler (Both Spectral and Color            Flow Doppler were utilized during procedure). Indications:      CHF I50.31  History:         Patient has no prior history of Echocardiogram examinations.  Sonographer:     Clenton Czech RDCS, FASE Referring Phys:  [id number] Lanetta Pion Diagnosing Phys: Debborah Fairly  Sonographer Comments: Technically challenging study due to limited acoustic windows, suboptimal parasternal window, suboptimal apical window, no subcostal window and patient is obese. Image acquisition challenging due to patient body habitus and Image acquisition challenging due to respiratory motion. Very challenging and difficult study due to several factors: chest wall/ lung interference, patient postioning (performed supine) and body habitus. IMPRESSIONS  1. Left ventricular ejection fraction, by estimation, is 35 to 40%. The left ventricle has moderately decreased function. The left ventricle demonstrates global hypokinesis. The left ventricular internal cavity size was moderately to severely dilated. Left ventricular diastolic parameters are consistent with Grade II diastolic dysfunction (pseudonormalization). The global longitudinal strain is normal.  2. Right ventricular systolic function is mildly reduced. The right ventricular size is moderately enlarged.  3. Left atrial size was moderately dilated.  4. Right atrial size was moderately dilated.  5. The mitral valve was not well visualized. No evidence of mitral valve regurgitation.  6. The aortic valve was not assessed. Aortic valve regurgitation is not visualized. Conclusion(s)/Recommendation(s): Poor windows for evaluation of left ventricular function by transthoracic echocardiography. Would recommend an alternative means of evaluation. FINDINGS  Left Ventricle: Left ventricular ejection fraction, by estimation, is 35 to 40%. The left ventricle has moderately decreased function. The left ventricle demonstrates global hypokinesis. Strain was performed and the global longitudinal strain is normal.  The left ventricular internal cavity size was  moderately to severely dilated. There is borderline left ventricular hypertrophy. Left ventricular diastolic parameters are consistent with Grade II diastolic dysfunction (pseudonormalization). Right Ventricle: The right ventricular size is moderately enlarged. Right vetricular wall thickness was not assessed. Right ventricular systolic function is mildly reduced. Left Atrium: Left atrial size was moderately dilated. Right Atrium: Right atrial size was moderately dilated. Pericardium: The pericardium was not assessed. Mitral Valve: The mitral valve was not well visualized. No evidence of mitral valve regurgitation. Tricuspid Valve: The tricuspid valve is not well visualized. Tricuspid valve regurgitation  is not demonstrated. Aortic Valve: The aortic valve was not assessed. Aortic valve regurgitation is not visualized. Pulmonic Valve: The pulmonic valve was not well visualized. Pulmonic valve regurgitation is not visualized. Aorta: The aortic root, ascending aorta and aortic arch are all structurally normal, with no evidence of dilitation or obstruction. IAS/Shunts: The interatrial septum was not assessed. Additional Comments: 3D was performed not requiring image post processing on an independent workstation and was indeterminate. [employer] Electronically signed by Debborah Fairly Signature Date/Time: 01/06/2024/12:59:22 PM    Final      ECHO as above  TELEMETRY reviewed by me 01/08/24: Sinus rhythm with PACs, rate 80-90s  EKG reviewed by me 01/08/24: atrial fibrillations with RBBB, rate 139 bpm  DATA reviewed by me 01/08/24: last 24h vitals tele labs imaging I/O, hospitalist, critical care, neurology progress notes  Principal Problem:   Atrial fibrillation with rapid ventricular response (HCC) Active Problems:   Essential hypertension, benign   Obesity, Class III, BMI 40-49.9 (morbid obesity) (HCC)   PAD (peripheral artery disease) (HCC)   Uncontrolled type 2 diabetes mellitus with hypoglycemia,  with long-term current use of insulin (HCC)   NSTEMI (non-ST elevated myocardial infarction) (HCC)   CAD S/P percutaneous coronary angioplasty   OSA on CPAP   Hx of right BKA (HCC)   History of left-sided carotid endarterectomy   Acute CHF (congestive heart failure) (HCC)   Acute hypercapnic respiratory failure (HCC)   Cardiac arrest (HCC)    ASSESSMENT AND PLAN:  [name] is a 63 y.o. female with a past medical history of coronary artery disease s/p PCI, OSA on CPAP, COPD, PAD s/p right BKA, left carotid endarterectomy , prior CVA (2021) who presented to the ED on 01/05/2024 for shortness of breath and fatigue. Patient was found to have new onset atrial fibrillation with RVR. Cardiology was consulted.  # Cardiac arrest # Acute respiratory failure  # New onset atrial fibrillation RVR  # Acute HFrEF with grade II diastolic dysfunction # NSTEMI, Hx Coronary artery disease s/p PCI # Hx of CVA (2021) Patient presented to the ED on 04/11 with shortness of breath, fatigue, palpitations and EKG revealed new onset atrial fibrillation with RVR. CT revealed right pleural effusion, pulmonary edema and no evidence of pulmonary embolism. CXR showed cardiomegaly. Trops elevated and trending 1,367 > 1,686 > 1,827 > 1,654 > 1,467 > 847. BNP elevated 1,263 > 750. Echo this admission shows EF 35-40%, global hypokinesis, borderline LVH, grade II diastolic dysfunction. IV metoprolol x2 given with no significant improvement in HR. IV dilt infusion started for tachycardia in 130s then stopped due to reduced EF. IV amio infusion started for atrial fibrillation on 04/13 and about 20 minutes later patient became bradycardia then pulseless asystole Code blue was called for Cardiac arrest with ROSC obtained 13 minutes. Patient remains intubated and on propofol. Today, Cr is improving 2.05 > 1.58. Patient is in sinus rhythm with stable HR.  -Continue heparin infusion for NSTEMI/cardiac arrest.  Likely discontinue  tomorrow. -Continue ASA 81 mg, atorvastatin 40 mg per tube. -Continue IV lasix 40 mg BID. Closely monitor renal function and UOP. -Defer GDMT until patient is extubated and able to take pills, renal function stabilizes. -No plan for further cardiac diagnostics at this time as patient is intubated. -Concern for myoclonus, PCCM has consulted neurology who are doing an EEG today to further assess neurologic function.  This patient's case was discussed and created with Dr. Beau Bound and he is in agreement.  Signed:  Hamp Levine, PA-C  01/08/2024, 10:17 AM [facility] Cardiology

## 2024-01-08 NOTE — Progress Notes (Signed)
 NAME:  [name], MRN:  [id number], DOB:  01/28/1961, LOS: 3 ADMISSION DATE:  01/05/2024, CONSULTATION DATE: 01/06/2024 REFERRING MD: Dr. Joylene Igo, CHIEF COMPLAINT: Cardiac Arrest    Brief Pt Description / Synopsis:  63 y.o. female admitted with new onset A.fib with RVR, NSTEMI, and decompensated HFrEF.  On 01/06/24, Hospital course complicated by bradycardia with progression to Asystole Cardiac Arrest (ROSC after 5 minutes).  Post arrest with development of eyelid myoclonus and concern for anoxic brain injury.   History of Present Illness:  This is a 63 yo female who presented to [facility] ER on 04/11 via EMS from home with c/o weakness and shortness of breath onset 3 days prior to presentation.   At baseline she wears CPAP at night, but is not on chronic O2.  Per EMS pt found to be in atrial fibrillation with rvr.  She does not have a hx of atrial fibrillation.  She was also hypoglycemic CBG 46, EMS administered glucagon.  However, she remained hypoglycemic and received 1 amp of D50.  CBG stabilized.    ED Course  Upon arrival to the ER initial EKG revealed atrial fibrillation with right bundle branch block, hr 139 but no acute ST elevation or depression present.  She received 1L iv fluid bolus.  Significant lab results were: glucose 58/BUN 27/creatinine 1.21/albumin 3.1/BNP 1,263.5/troponin 1,367/wbc 11.9/hgb 9.7/platelet count 450.  CTA Chest negative for PE, but concerning for interstitial edema.  She received 5 mg of iv metoprolol x2 with no significant improvement in heart rate.  Heparin bolus followed by heparin gtt administered for possible NSTEMI.  She was subsequently admitted to the progressive care unit per hospitalist team for additional workup and treatment, however she remained in the ER pending bed availability.  See detailed hospital course below under significant events.   CTA Chest PE:  No evidence acute pulmonary embolism. RIGHT pleural effusion with RIGHT basilar atelectasis. Mild  interstitial thickening at the lung bases suggest interstitial edema. Ill-defined ground-glass densities in the upper lobes. Findings suggest mild pulmonary edema. Benign LEFT adrenal adenoma.  CXR: Detail limited exam without definite plain film evidence of acute process. Enlarged heart.  Pertinent  Medical History  Anemia  Anxiety  Arthritis  COPD CAD Type II Diabetes Mellitus  Fibromyalgia  GERD  HTN  PVD OSA Stroke  CHF   Micro Data:  4/11: SARS-CoV-2/flu/RSV PCR>>Negative 04/11: HIV screen>> nonreactive 4/12: MRSA PCR>> negative 4/12: Blood culture x 2>> no growth to date  Antimicrobials:   Anti-infectives (From admission, onward)    Start     Dose/Rate Route Frequency Ordered Stop   01/07/24 1100  piperacillin-tazobactam (ZOSYN) IVPB 3.375 g        3.375 g 12.5 mL/hr over 240 Minutes Intravenous Every 8 hours 01/07/24 1001        Significant Hospital Events: Including procedures, antibiotic start and stop dates in addition to other pertinent events   04/11: Pt admitted to the progressive care unit with new onset atrial fibrillation with rvr, acute respiratory failure secondary to acute CHF exacerbation, and NSTEMI.   04/12: Pt remained in atrial fibrillation with rvr amiodarone bolus administered followed by amiodarone gtt at 60 mg/hr.  Following administration she became bradycardic and pulseless ACLS protocol initiated with ROSC 13 minutes following initiation of ACLS protocol.  Pt mechanically intubated during cardiac arrest.  Post cardiac arrest pt comatose, unable to follow commands, with no gag/oculocephalic reflexes present despite NOT receiving RSI medications and intermittent eyelid myoclonus present neurology consulted  and cerebell initiated 04/13: Pt with worsening myoclonus, cerebell remains in place and noted to have myoclonic seizures every 1-2 minutes suggestive of anoxic/hypoxic brain injury.  Propofol gtt ordered for suppression.  04/14: Plan for  repeat EEG today.  On Neuro exam, currently off propofol, no myoclonus noted.  Does have + brainstem reflexes, somewhat opening eyes to noxious stimulation.  Allowing time for neuro prognostication, plan for MRI Brain tomorrow.  Interim History / Subjective:  As outlined above under significant events   Objective   Blood pressure 138/86, pulse 95, temperature 99 F (37.2 C), resp. rate 20, height 5' 5.5" (1.664 m), weight 127.2 kg, SpO2 97%.    Vent Mode: PRVC FiO2 (%):  [28 %-30 %] 28 % Set Rate:  [20 bmp] 20 bmp Vt Set:  [450 mL] 450 mL PEEP:  [5 cmH20] 5 cmH20 Plateau Pressure:  [16 cmH20-19 cmH20] 19 cmH20   Intake/Output Summary (Last 24 hours) at 01/08/2024 4098 Last data filed at 01/08/2024 0700 Gross per 24 hour  Intake 4204.46 ml  Output 3885 ml  Net 319.46 ml   Filed Weights   01/06/24 1539 01/07/24 0400 01/08/24 0508  Weight: 123.8 kg 124.7 kg 127.2 kg   Examination: General: Acutely-ill appearing obese female, NAD mechanically intubated  HENT: Supple, unable to assess for JVD due to body habitus, orally intubated Lungs: Faint rhonchi throughout, even, non labored, overbreathing the vent Cardiovascular: NSR, s1s2, no m/r/g, 2+ radial/1+ left distal pulse, 1+ right femoral pulse, no edema  Abdomen: +BS x4, obese, soft, non distended  Extremities: Right BKA  Neuro: Off Sedation, not following commands or withdrawing from stimulation, + cough/gag/corneal reflexes, bilateral pupils very sluggish, no myoclonus GU: Indwelling foley catheter present draining yellow urine   Resolved Hospital Problem list     Assessment & Plan:   #Acute metabolic encephalopathy  #Intermittent eyelid myoclonus concerning for 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 Severe chronic small vessel ischemic disease - Treatment of metabolic derangements as outlined below - Maintain RASS goal of 0 to -1 -  Propofol as needed for suppression of myoclonus per neuro recommendations  - Avoid sedating medications as able  - Continue scheduled keppra 1g q12rs for myoclonus  - Repeat EEG 04/14 - Neurology following, appreciate input: recommending deferring MRI Brain until 72 hrs post cardiac arrest on 4/15 for prognostication - Seizure precautions  - Normothermia protocol  - WUA daily   #Cardiac arrest, suspect secondary to amiodarone (asystole) #NSTEMI  #New-onset atrial fibrillation with rvr  #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  Hx: HTN, PVD, stroke, and CAD  Echo 01/06/2024: EF 35 to 40%; grade II diastolic dysfunction  - Continuous telemetry monitoring  - Will hold antiarrhythmics for now  - Continue heparin gtt: dosing per pharmacy  - Diurese as bp and renal function permits  - Hold antihypertensives for now  - Maintain map 65 or higher  - Continue aspirin and atorvastatin  - Cardiology following, appreciate input  - Troponin peaked at 1,827 - TSH normal: 2.6  #Acute hypoxic respiratory failure  #Mechanical intubation  Hx: COPD and OSA  -Full vent support, implement lung protective strategies -Plateau pressures less than 30 cm H20 -Wean FiO2 & PEEP as tolerated to maintain O2 sats 88 to 92% -Follow intermittent Chest X-ray & ABG as needed -Spontaneous Breathing Trials when respiratory parameters met and mental status permits ~ MENTAL STATUS CURRENTLY PRECLUDING EXTUBATION - Implement  VAP Bundle - Prn Bronchodilators - Prn bronchodilator therapy  - Diuresis as BP and renal function permits - ABX as above  #Acute kidney injury secondary to ATN ~ improving #Severe metabolic acidosis ~ resolved #Lactic acidosis  #Mild Hypokalemia - Monitor I&O's / urinary output - Follow BMP - Ensure adequate renal perfusion - Avoid nephrotoxic agents as able - Replace electrolytes as indicated ~ Pharmacy following for assistance with electrolyte replacement - D/c bicarb  gtt 4/14  #Transamanitis  - Trend hepatic function panel  - Avoid hepatotoxic agents as able   #Anemia without obvious signs of bleeding  - Trend CBC  - Monitor for s/sx of bleeding  - Transfuse for hgb <7   #Type II diabetes mellitus  #Hypoglycemia~resolved  - CBG's q4hrs  - SSI  - Follow hypo/hyperglycemic protocol  - Target CBG range 140 to 180    Pt with worsening myoclonus cerebell showing myoclonic seizures every 1-2 minutes suggestive of anoxic/hypoxic brain injury.  Pt with poor prognosis, recommend changing code status to DO NOT RESUSCITATE.  Allowing time for Neuro prognostication. Depending on clinical course, may need to consult Palliative Care.  Best Practice (right click and "Reselect all SmartList Selections" daily)   Diet/type: NPO; dietitian consulted for TF's  DVT prophylaxis systemic heparin Pressure ulcer(s): N/A GI prophylaxis: PPI Lines: N/A Foley:  Yes, and it is still needed Code Status:  full code Last date of multidisciplinary goals of care discussion [01/08/2024]  04/14: Pts husband updated at bedside on plan of care.  Labs   CBC: Recent Labs  Lab 01/05/24 1547 01/06/24 0431 01/07/24 0258 01/07/24 0516 01/08/24 0412  WBC 11.9* 12.6* 25.2* 25.5* 26.2*  NEUTROABS 7.9*  --   --   --   --   HGB 9.7* 8.9* 10.0* 9.9* 9.3*  HCT 35.5* 32.9* 35.8* 36.0 33.7*  MCV 77.9* 76.0* 75.8* 75.2* 74.9*  PLT 450* 417* 485* 442* 317    Basic Metabolic Panel: Recent Labs  Lab 01/05/24 1547 01/06/24 1549 01/07/24 0258 01/07/24 0516 01/08/24 0412  NA 141 137 136 136 140  K 3.7 5.0 4.6 4.5 3.4*  CL 105 103 100 101 98  CO2 26 19* 22 23 31   GLUCOSE 58* 160* 197* 198* 229*  BUN 27* 38* 44* 43* 45*  CREATININE 1.21* 1.86* 1.93* 2.05* 1.58*  CALCIUM 9.2 8.4* 8.3* 8.4* 7.9*  MG  --  2.5* 2.7* 2.3 2.3  PHOS  --  7.0* 6.0* 5.7* 3.7   GFR: Estimated Creatinine Clearance: 49.4 mL/min (A) (by C-G formula based on SCr of 1.58 mg/dL (H)). Recent Labs   Lab 01/06/24 0431 01/06/24 1658 01/07/24 0258 01/07/24 0516 01/08/24 0412  WBC 12.6*  --  25.2* 25.5* 26.2*  LATICACIDVEN  --  4.9*  --  2.7*  --     Liver Function Tests: Recent Labs  Lab 01/05/24 1547 01/06/24 1549 01/07/24 0258 01/07/24 0516  AST 24 101*  --  90*  ALT 26 84*  --  110*  ALKPHOS 95 99  --  101  BILITOT 0.6 1.0  --  0.9  PROT 7.2 6.2*  --  6.4*  ALBUMIN 3.1* 2.7* 2.9* 2.8*   Recent Labs  Lab 01/05/24 1547  LIPASE 21   No results for input(s): "AMMONIA" in the last 168 hours.  ABG    Component Value Date/Time   PHART 7.04 (LL) 01/06/2024 1354   PCO2ART 62 (H) 01/06/2024 1354   PO2ART 116 (H) 01/06/2024 1354   HCO3 24.4  01/06/2024 1925   ACIDBASEDEF 3.8 (H) 01/06/2024 1925   O2SAT 76 01/06/2024 1925     Coagulation Profile: Recent Labs  Lab 01/05/24 1839  INR 1.4*    Cardiac Enzymes: No results for input(s): "CKTOTAL", "CKMB", "CKMBINDEX", "TROPONINI" in the last 168 hours.  HbA1C: Hgb A1c MFr Bld  Date/Time Value Ref Range Status  09/23/2020 07:10 AM 8.6 (H) 4.8 - 5.6 % Final    Comment:    (NOTE) Pre diabetes:          5.7%-6.4%  Diabetes:              >6.4%  Glycemic control for   <7.0% adults with diabetes     CBG: Recent Labs  Lab 01/07/24 1603 01/07/24 1928 01/07/24 2343 01/08/24 0457 01/08/24 0737  GLUCAP 184* 182* 215* 215* 247*    Review of Systems:   Unable to assess pt mechanically intubated/sedation/critically ill  Past Medical History:  She,  has a past medical history of Anemia, Anxiety, Arthritis, CHF (congestive heart failure) (HCC), COPD (chronic obstructive pulmonary disease) (HCC), Coronary artery disease, Diabetes mellitus without complication (HCC), Fibromyalgia, GERD (gastroesophageal reflux disease), Hypertension, Peripheral vascular disease (HCC), Sleep apnea, and Stroke (HCC).   Surgical History:   Past Surgical History:  Procedure Laterality Date   CESAREAN SECTION     COLONOSCOPY WITH  PROPOFOL N/A 08/02/2022   Procedure: COLONOSCOPY WITH PROPOFOL;  Surgeon: Marnee Sink, MD;  Location: [facility] ENDOSCOPY;  Service: Endoscopy;  Laterality: N/A;   CORONARY ANGIOPLASTY WITH STENT PLACEMENT Left    DILATION AND CURETTAGE OF UTERUS     ENDARTERECTOMY Left 01/29/2015   Procedure: ENDARTERECTOMY CAROTID;  Surgeon: Celso College, MD;  Location: [facility] ORS;  Service: Vascular;  Laterality: Left;   LOWER EXTREMITY ANGIOGRAPHY Left 09/24/2020   Procedure: Lower Extremity Angiography;  Surgeon: Celso College, MD;  Location: [facility] INVASIVE CV LAB;  Service: Cardiovascular;  Laterality: Left;   PERIPHERAL VASCULAR CATHETERIZATION Right 05/27/2015   Procedure: Lower Extremity Angiography;  Surgeon: Celso College, MD;  Location: [facility] INVASIVE CV LAB;  Service: Cardiovascular;  Laterality: Right;   PERIPHERAL VASCULAR CATHETERIZATION  05/27/2015   Procedure: Lower Extremity Intervention;  Surgeon: Celso College, MD;  Location: [facility] INVASIVE CV LAB;  Service: Cardiovascular;;   PERIPHERAL VASCULAR CATHETERIZATION Right 09/22/2016   Procedure: Lower Extremity Angiography;  Surgeon: Celso College, MD;  Location: [facility] INVASIVE CV LAB;  Service: Cardiovascular;  Laterality: Right;   TUBAL LIGATION       Social History:   reports that she quit smoking about 7 years ago. Her smoking use included cigarettes. She started smoking about 45 years ago. She has a 9.5 pack-year smoking history. She uses smokeless tobacco. She reports that she does not drink alcohol and does not use drugs.   Family History:  Her family history is not on file.   Allergies Allergies  Allergen Reactions   Niacin And Related Nausea And Vomiting     Home Medications  Prior to Admission medications   Medication Sig Start Date End Date Taking? Authorizing Provider  atorvastatin (LIPITOR) 40 MG tablet Take 40 mg by mouth every evening.   Yes [provider]  buPROPion (WELLBUTRIN XL) 300 MG 24 hr tablet Take 300 mg by mouth daily.  Pt taking 300 mg by mouth daily   Yes [provider]  cetirizine (ZYRTEC) 10 MG tablet Take 10 mg by mouth daily.   Yes [provider]  DULoxetine (CYMBALTA) 60 MG  capsule Take 60 mg by mouth daily.   Yes [provider]  enalapril (VASOTEC) 5 MG tablet Take 5 mg by mouth daily.   Yes [provider]  HUMULIN R U-500 KWIKPEN 500 UNIT/ML kwikpen Inject 40-80 Units into the skin See admin instructions. Inject 80u under the skin every morning before breakfast and inject 40u under the skin every evening before supper   Yes [provider]  metFORMIN (GLUCOPHAGE-XR) 500 MG 24 hr tablet Take 500 mg by mouth 2 (two) times daily with a meal.   Yes [provider]  OLANZapine (ZYPREXA) 5 MG tablet Take 5 mg by mouth daily. 02/11/21  Yes [provider]  oxybutynin (DITROPAN-XL) 10 MG 24 hr tablet Take 10 mg by mouth daily.   Yes [provider]  pantoprazole (PROTONIX) 40 MG tablet Take 40 mg by mouth daily.   Yes [provider]  traZODone (DESYREL) 50 MG tablet Take 100 mg by mouth at bedtime.   Yes [provider]  collagenase (SANTYL) ointment Apply topically daily. Patient not taking: Reported on 01/05/2024 09/26/20   Alphonsus Jeans, MD  Dulaglutide (TRULICITY) 0.75 MG/0.5ML SOPN Inject 0.75 mg into the skin every Tuesday. Patient not taking: Reported on 06/24/2022    [provider]  gabapentin (NEURONTIN) 300 MG capsule Take 300-600 mg by mouth at bedtime. Patient not taking: Reported on 01/05/2024    [provider]     Critical care time: 45 minutes     Cherylann Corpus, AGACNP-BC [facility] Prefer epic messenger for cross cover needs If after hours, please call E-link

## 2024-01-08 NOTE — Progress Notes (Addendum)
 Palliative Care Progress Note, Assessment & Plan   Patient Name: [name]       Date: 01/08/2024 DOB: 1961/04/25  Age: 63 y.o. MRN#: [id number] Attending Physician: Annitta Kindler, MD Primary Care Physician: Ardia Kraft, NP Admit Date: 01/05/2024  Subjective: Mechanically ventilated  HPI:  63 y.o. female  with past medical history of HTN, DM2, HLD, CAD s/p PCI, PAD s/p right BKA and left carotid endarterectomy, prior CVA (2021), morbid obesity with OSA on CPAP, COPD and left sided diabetic hemichorea. She presented to ED 01/05/24 via EMS c/o SHOB, orthopnea and fatigue x 3 days with an episode of heartburn 1 day prior. EMS found patient to be in A-fib with rate of 140-180 and CBG of 46. She received glucagon en route. In the ED, she was found to be hypoxic 91% with HR 120-130's. ED workup showed: Troponin 1367-->1686, BNP 1263, CBC notable for WBC of 12,000 and hemoglobin 9.7 (most recent baseline 11.3 a year ago) Creatinine 1.21, near baseline of 1.14 Respiratory viral panel negative for COVID flu and RSV EKG with A-fib at 139 and RBBB CTA chest negative for PE with mild interstitial thickening at lung bases suggesting pulmonary edema and benign left adrenal adenoma.  She was admitted to the progressive care unit for management of new onset A-fib with RVR, acute CHF exacerbation and NSTEMI. She remained in ED waiting for progressive bed to open.  On 4/12, while continuing to wait for bed, she was found to be bradycardic 20's-30's and became unresponsive. ACLS was initiated by ED staff. Initial rhythm was asystole. Patient received 4 doses of epi and achieved after 13 minutes. She was intubated and transferred to ICU.  Pt continues to have worsening myoclonus cerebell showing myoclonic seizures  every 1-2 minutes suggestive of anoxic/hypoxic brain injury with poor prognosis.    PMT consulted for goals of care  Summary of counseling/coordination of care: Extensive chart review completed prior to meeting patient including labs, vital signs, imaging, progress notes, orders, and available advanced directive documents from current and previous encounters.   After reviewing the patient's chart and assessing the patient at bedside, I spoke with patient's family in regards to symptom management and goals of care.   Ill-appearing, obese female on vent, receiving daily hygiene care from staff.   Met with family in ICU waiting area. Advised family that I was there inquiring if they had questions or concerns. Hattie and Buddy share that they are still curious about what the MRI planned for later in the week will show. No other concerns at this time. Discussed they can call PMT team with any questions or concerns they may have. All are appreciative of visit.   Therapeutic silence and active listening provided for family to share their  thoughts and emotions regarding current medical situation.  Emotional support provided.  Physical Exam Vitals reviewed.  Constitutional:      General: She is not in acute distress.    Appearance: She is obese. She is ill-appearing.  Pulmonary:     Comments: Mechanically ventilated  Skin:    General: Skin is warm and dry.   Recommendations/Plan: Full Code Continue current supportive interventions Allowing time for outcomes Ongoing  conversations between PMT and family           Total Time 25 minutes   Time spent includes: Detailed review of medical records (labs, imaging, vital signs), medically appropriate exam (mental status, respiratory, cardiac, skin), discussed with treatment team, counseling and educating patient, family and staff, documenting clinical information, medication management and coordination of care.     Ina Manas, Joyice Nodal-  [facility] Palliative Medicine Team  01/08/2024 10:18 AM  Office [phone]  Pager [phone]

## 2024-01-08 NOTE — Progress Notes (Signed)
 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  Physical Exam   Gen: patient lying in bed, intubated, not sedated CV: extremities appear well-perfused Resp: ventilated  Neurologic Examination   GCS 3 off sedation   MS: comatose, no response to commands or noxious stimuli Speech: intubated CN: Pupils 2mm ERRL, (+) corneals and cough, (-) oculocephalics and gag Motor & sensory: no response to noxious stimuli in any extremity. Myoclonic seizures every 1-2 min Reflexes: 1+ throughout toes mute    Medications  Current Facility-Administered Medications:    acetaminophen (TYLENOL) tablet 650 mg, 650 mg, Oral, Q4H PRN, Duncan, Hazel V, MD   aspirin EC tablet 81 mg, 81 mg, Oral, Daily, Brion Cancel V, MD, 81 mg at 01/07/24 1127   atorvastatin (LIPITOR) tablet 40 mg, 40 mg, Oral, Daily, Brion Cancel V, MD, 40 mg at 01/07/24 1125   buPROPion (WELLBUTRIN XL) 24 hr tablet 300 mg, 300 mg, Oral, Daily, Brion Cancel V, MD, 300 mg at 01/07/24 1125   Chlorhexidine Gluconate Cloth 2 % PADS 6 each, 6 each, Topical, Daily, Nelson, Dana G, NP, 6 each at 01/07/24 2200   dextrose 50 % solution 50 mL, 1 ampule, Intravenous, PRN, Lanetta Pion, MD, 50 mL at 01/05/24 2304   docusate (COLACE) 50 MG/5ML liquid 100 mg, 100 mg, Per Tube, BID, Nelson, Dana G, NP, 100 mg at 01/07/24 2108   DULoxetine (CYMBALTA) DR capsule 60 mg, 60 mg, Oral, Daily, Brion Cancel V, MD, 60 mg  at 01/07/24 1127   feeding supplement (PROSource TF20) liquid 60 mL, 60 mL, Per Tube, BID, Kasa, Kurian, MD, 60 mL at 01/07/24 2108   feeding supplement (VITAL AF 1.2 CAL) liquid 1,000 mL, 1,000 mL, Per Tube, Continuous, Kasa, Sundra Engel, MD, Last Rate: 55 mL/hr at 01/08/24 0700, Infusion Verify at 01/08/24 0700   furosemide (LASIX) injection 20 mg, 20 mg, Intravenous, Q12H, Brion Cancel V, MD, 20 mg at 01/07/24 2110   heparin ADULT infusion 100 units/mL (25000 units/250mL), 1,650 Units/hr, Intravenous, Continuous, Kasa, Kurian, MD, Last Rate: 16.5 mL/hr at 01/08/24 0700, 1,650 Units/hr at 01/08/24 0700   hydrALAZINE (APRESOLINE) injection 5 mg, 5 mg, Intravenous, Q4H PRN, Tukov-Yual, Magdalene S, NP, 5 mg at 01/07/24 0445   influenza vac split trivalent PF (FLULAVAL) injection 0.5 mL, 0.5 mL, Intramuscular, Tomorrow-1000, Kasa, Kurian, MD   insulin aspart (novoLOG) injection 0-15 Units, 0-15 Units, Subcutaneous, Q4H, Nelson, Dana G, NP, 5 Units at 01/08/24 0459   ipratropium-albuterol (DUONEB) 0.5-2.5 (3) MG/3ML nebulizer solution 3 mL, 3 mL, Nebulization, Q6H PRN, Nelson, Dana G, NP   levETIRAcetam (KEPPRA) IVPB 500 mg/100 mL premix, 500 mg, Intravenous, Q12H, Nelson, Dana G, NP, Stopped at 01/08/24 0134   midazolam (VERSED) injection 2-4 mg, 2-4 mg, Intravenous, Q1H PRN, Tukov-Yual, Magdalene S, NP, 2 mg at 01/08/24  0102   nitroGLYCERIN (NITROSTAT) SL tablet 0.4 mg, 0.4 mg, Sublingual, Q5 Min x 3 PRN, Andris Baumann, MD   ondansetron [facility]) injection 4 mg, 4 mg, Intravenous, Q6H PRN, Andris Baumann, MD   Oral care mouth rinse, 15 mL, Mouth Rinse, Q2H, Kasa, Kurian, MD, 15 mL at 01/08/24 0507   Oral care mouth rinse, 15 mL, Mouth Rinse, PRN, Erin Fulling, MD, 15 mL at 01/06/24 2200   pantoprazole (PROTONIX) injection 40 mg, 40 mg, Intravenous, QHS, Lowella Bandy, RPH, 40 mg at 01/07/24 2106   piperacillin-tazobactam (ZOSYN) IVPB 3.375 g, 3.375 g, Intravenous, Q8H, Kasa, Wallis Bamberg, MD, Last Rate:  12.5 mL/hr at 01/08/24 0700, Infusion Verify at 01/08/24 0700   pneumococcal 20-valent conjugate vaccine (PREVNAR 20) injection 0.5 mL, 0.5 mL, Intramuscular, Tomorrow-1000, Kasa, Kurian, MD   polyethylene glycol (MIRALAX / GLYCOLAX) packet 17 g, 17 g, Per Tube, Daily, Ezequiel Essex, NP, 17 g at 01/07/24 1254   potassium chloride (KLOR-CON) packet 40 mEq, 40 mEq, Per Tube, Once, Dorothea Ogle B, RPH   propofol (DIPRIVAN) 1000 MG/100ML infusion, 5-80 mcg/kg/min, Intravenous, Titrated, Ezequiel Essex, NP, Last Rate: 7.48 mL/hr at 01/08/24 0700, 10 mcg/kg/min at 01/08/24 0700   sodium bicarbonate 150 mEq in sterile water 1,150 mL infusion, , Intravenous, Continuous, Ezequiel Essex, NP, Last Rate: 100 mL/hr at 01/08/24 0700, Infusion Verify at 01/08/24 0700   thiamine (VITAMIN B1) tablet 100 mg, 100 mg, Per Tube, Daily, Erin Fulling, MD, 100 mg at 01/07/24 1540  Labs and Diagnostic Imaging   CBC:  Recent Labs  Lab 01/05/24 1547 01/06/24 0431 01/07/24 0516 01/08/24 0412  WBC 11.9*   < > 25.5* 26.2*  NEUTROABS 7.9*  --   --   --   HGB 9.7*   < > 9.9* 9.3*  HCT 35.5*   < > 36.0 33.7*  MCV 77.9*   < > 75.2* 74.9*  PLT 450*   < > 442* 317   < > = values in this interval not displayed.    Basic Metabolic Panel:  Lab Results  Component Value Date   NA 140 01/08/2024   K 3.4 (L) 01/08/2024   CO2 31 01/08/2024   GLUCOSE 229 (H) 01/08/2024   BUN 45 (H) 01/08/2024   CREATININE 1.58 (H) 01/08/2024   CALCIUM 7.9 (L) 01/08/2024   GFRNONAA 37 (L) 01/08/2024   GFRAA >60 09/20/2016   Lipid Panel: No results found for: "LDLCALC" HgbA1c:  Lab Results  Component Value Date   HGBA1C 8.6 (H) 09/23/2020   Urine Drug Screen: No results found for: "LABOPIA", "COCAINSCRNUR", "LABBENZ", "AMPHETMU", "THCU", "LABBARB"  Alcohol Level No results found for: "ETH" INR  Lab Results  Component Value Date   INR 1.4 (H) 01/05/2024   APTT  Lab Results  Component Value Date   APTT 87 (H) 01/05/2024    AED levels: No results found for: "PHENYTOIN", "ZONISAMIDE", "LAMOTRIGINE", "LEVETIRACETA"  CT Head without contrast(Personally reviewed): No ICH or other acute findings   Ceribell Myoclonic seizures q 1-2 min  Assessment   This is a 63 year old woman with past medical history significant CAD status post PCI, PAD status post right BKA, left carotid endarterectomy, prior CVA in 2021, morbid obesity with OSA on CPAP, COPD, left-sided diabetic Hemi chorea who was admitted overnight with new onset A-fib with RVR, NSTEMI, acute decompensated heart failure, acute hypoxic respiratory failure requiring oxygen supplementation who suffered inpatient cardiac arrest today after brief bout of severe bradycardia/ Time to ROSC  was 5 minutes. Immediately after ROSC she developed persistent eyelid myoclonus which has now resolved. On exam off sedation she has a GCS of 3 with some brainstem reflexes intact (pupils, corneals, cough) but no response to commands and no motor response to noxious stimuli in any extremity. She was on heparin gtt for a fib but head CT showed no e/o ICH. Her exam today is unchanged. She is having myoclonic seizures both clinically and on EEG q 1-2 min which is a poor prognostic sign however it remains too early for formal neuro prognostication.  Recommendations   - Increase keppra to 1000mg  bid for myoclonus - rEEG Mon - Will continue to follow for prognostication, recommend deferring MRI brain until day 3 after cardiac arrest   This patient is critically ill and at significant risk of neurological worsening, death and care requires constant monitoring of vital signs, hemodynamics,respiratory and cardiac monitoring, neurological assessment, discussion with family, other specialists and medical decision making of high complexity. I spent 45 minutes of neurocritical care time  in the care of  this patient. This was time spent independent of any time provided by nurse practitioner or PA.    Greg Leaks, MD [facility] [phone]   If 7pm- 7am, please page neurology on call as listed in AMION.   ______________________________________________________________________   Signed, Eleni Griffin, MD [facility]

## 2024-01-08 NOTE — Progress Notes (Signed)
 NEUROLOGY CONSULT FOLLOW UP NOTE   Date of service: January 08, 2024 Patient Name: [name] MRN:  [id number] DOB:  [birth year]  Interval Hx/subjective  Patient seen and examined.  Was on propofol 10 mcg/kg/min last night which was turned off about 15 minutes prior to the exam this morning.  Vitals   Vitals:   01/08/24 0600 01/08/24 0700 01/08/24 0755 01/08/24 0800  BP: 135/77 138/86  (!) 150/91  Pulse: 94 95    Resp: (!) 25 20  (!) 22  Temp: 98.8 F (37.1 C) 99 F (37.2 C)  (P) 98.8 F (37.1 C)  TempSrc:    (P) Bladder  SpO2: 97% 99% 97% (P) 98%  Weight:      Height:         Body mass index is 45.96 kg/m.  Physical Exam  General: Sedated intubated, sedation held for the exam HEENT: Normocephalic atraumatic Lungs: Vented Cardiovascular: Irregularly irregular Neurological exam Comatose, no response to voice. To noxious stimulation, eye-opening but does not follow commands or track. Nonverbal Cranial nerves: Pupils 2 mm, round, sluggishly reactive, present corneals, present cough and gag, oculocephalics difficult to elicit-mildly disconjugate gaze. Motor: No spontaneous movement Sensory exam: No withdrawal or attempted localize on noxious stimulation Coordination cannot be assessed  Medications  Current Facility-Administered Medications:    acetaminophen (TYLENOL) tablet 650 mg, 650 mg, Oral, Q4H PRN, Duncan, Hazel V, MD   aspirin chewable tablet 81 mg, 81 mg, Per Tube, Daily, Page Boast, RPH, 81 mg at 01/08/24 0836   atorvastatin (LIPITOR) tablet 40 mg, 40 mg, Per Tube, Daily, Chappell, Alex B, RPH, 40 mg at 01/08/24 1308   buPROPion (WELLBUTRIN XL) 24 hr tablet 300 mg, 300 mg, Oral, Daily, Brion Cancel V, MD, 300 mg at 01/07/24 1125   Chlorhexidine Gluconate Cloth 2 % PADS 6 each, 6 each, Topical, Daily, Nelson, Dana G, NP, 6 each at 01/08/24 0837   dextrose 50 % solution 50 mL, 1 ampule, Intravenous, PRN, Lanetta Pion, MD, 50 mL at 01/05/24 2304    docusate (COLACE) 50 MG/5ML liquid 100 mg, 100 mg, Per Tube, BID, Nelson, Dana G, NP, 100 mg at 01/08/24 0836   DULoxetine (CYMBALTA) DR capsule 60 mg, 60 mg, Oral, Daily, Brion Cancel V, MD, 60 mg at 01/07/24 1127   feeding supplement (PROSource TF20) liquid 60 mL, 60 mL, Per Tube, BID, Kasa, Kurian, MD, 60 mL at 01/08/24 0841   feeding supplement (VITAL AF 1.2 CAL) liquid 1,000 mL, 1,000 mL, Per Tube, Continuous, Kasa, Kurian, MD, Last Rate: 55 mL/hr at 01/08/24 0700, Infusion Verify at 01/08/24 0700   furosemide (LASIX) injection 20 mg, 20 mg, Intravenous, Q12H, Brion Cancel V, MD, 20 mg at 01/08/24 0836   heparin ADULT infusion 100 units/mL (25000 units/250mL), 1,650 Units/hr, Intravenous, Continuous, Kasa, Kurian, MD, Last Rate: 16.5 mL/hr at 01/08/24 0700, 1,650 Units/hr at 01/08/24 0700   hydrALAZINE (APRESOLINE) injection 5 mg, 5 mg, Intravenous, Q4H PRN, Tukov-Yual, Magdalene S, NP, 5 mg at 01/07/24 0445   influenza vac split trivalent PF (FLULAVAL) injection 0.5 mL, 0.5 mL, Intramuscular, Tomorrow-1000, Kasa, Kurian, MD   insulin aspart (novoLOG) injection 0-15 Units, 0-15 Units, Subcutaneous, Q4H, Nelson, Dana G, NP, 5 Units at 01/08/24 0836   ipratropium-albuterol (DUONEB) 0.5-2.5 (3) MG/3ML nebulizer solution 3 mL, 3 mL, Nebulization, Q6H PRN, Nelson, Dana G, NP   levETIRAcetam (KEPPRA) IVPB 1000 mg/100 mL premix, 1,000 mg, Intravenous, Q12H, Eleni Griffin, MD   midazolam (VERSED) injection 2-4 mg,  2-4 mg, Intravenous, Q1H PRN, Tukov-Yual, Magdalene S, NP, 2 mg at 01/08/24 0102   nitroGLYCERIN (NITROSTAT) SL tablet 0.4 mg, 0.4 mg, Sublingual, Q5 Min x 3 PRN, Lanetta Pion, MD   ondansetron (ZOFRAN) injection 4 mg, 4 mg, Intravenous, Q6H PRN, Lanetta Pion, MD   Oral care mouth rinse, 15 mL, Mouth Rinse, Q2H, Kasa, Kurian, MD, 15 mL at 01/08/24 0840   Oral care mouth rinse, 15 mL, Mouth Rinse, PRN, Cleve Dale, MD, 15 mL at 01/06/24 2200   pantoprazole (PROTONIX) injection 40  mg, 40 mg, Intravenous, QHS, Adalberto Acton, RPH, 40 mg at 01/07/24 2106   piperacillin-tazobactam (ZOSYN) IVPB 3.375 g, 3.375 g, Intravenous, Q8H, Kasa, Sundra Engel, MD, Last Rate: 12.5 mL/hr at 01/08/24 0700, Infusion Verify at 01/08/24 0700   pneumococcal 20-valent conjugate vaccine (PREVNAR 20) injection 0.5 mL, 0.5 mL, Intramuscular, Tomorrow-1000, Kasa, Kurian, MD   polyethylene glycol (MIRALAX / GLYCOLAX) packet 17 g, 17 g, Per Tube, Daily, Domingo Friend, NP, 17 g at 01/08/24 0836   potassium chloride (KLOR-CON) packet 40 mEq, 40 mEq, Per Tube, Once, Anderson Banana B, RPH   propofol (DIPRIVAN) 1000 MG/100ML infusion, 5-80 mcg/kg/min, Intravenous, Titrated, Domingo Friend, NP, Last Rate: 7.48 mL/hr at 01/08/24 0700, 10 mcg/kg/min at 01/08/24 0700   sodium bicarbonate 150 mEq in sterile water 1,150 mL infusion, , Intravenous, Continuous, Domingo Friend, NP, Last Rate: 100 mL/hr at 01/08/24 0700, Infusion Verify at 01/08/24 0700   thiamine (VITAMIN B1) tablet 100 mg, 100 mg, Per Tube, Daily, Cleve Dale, MD, 100 mg at 01/08/24 0836  Labs and Diagnostic Imaging   CBC:  Recent Labs  Lab 01/05/24 1547 01/06/24 0431 01/07/24 0516 01/08/24 0412  WBC 11.9*   < > 25.5* 26.2*  NEUTROABS 7.9*  --   --   --   HGB 9.7*   < > 9.9* 9.3*  HCT 35.5*   < > 36.0 33.7*  MCV 77.9*   < > 75.2* 74.9*  PLT 450*   < > 442* 317   < > = values in this interval not displayed.    Basic Metabolic Panel:  Lab Results  Component Value Date   NA 140 01/08/2024   K 3.4 (L) 01/08/2024   CO2 31 01/08/2024   GLUCOSE 229 (H) 01/08/2024   BUN 45 (H) 01/08/2024   CREATININE 1.58 (H) 01/08/2024   CALCIUM 7.9 (L) 01/08/2024   GFRNONAA 37 (L) 01/08/2024   GFRAA >60 09/20/2016   Lipid Panel: No results found for: "LDLCALC" HgbA1c:  Lab Results  Component Value Date   HGBA1C 8.6 (H) 09/23/2020    INR  Lab Results  Component Value Date   INR 1.4 (H) 01/05/2024   APTT  Lab Results  Component Value Date    APTT 87 (H) 01/05/2024   CT Head without contrast(Personally reviewed): 01/06/2024-no acute intracranial process  Assessment   [name] is a 63 y.o. female past medical history of CAD status post PCI, PAD status post right BKA, left carotid endarterectomy, prior stroke in 2021, morbid obesity with sleep apnea on CPAP, COPD, left-sided diabetic hemichorea who was admitted with new onset atrial fibrillation with RVR, NSTEMI and acute decompensated heart failure, acute respiratory failure requiring oxygen supplementation who suffered an inpatient cardiac arrest on the evening of 01/06/2024 after a brief bout of severe bradycardia. Time to ROSC was 5 minutes.  Immediately after ROSC developed, she had persistent eyelid myoclonus which then resolved. Remained comatose on exam  over the last 2 days for the outgoing neurologist who handed off her care to me. She was a GCS of 3, now may be a GCS of 4 with eye-opening to noxious stimulation. No meaningful response on exam Suspect hypoxic/anoxic brain injury but will wait full 72 hours prior to formal prognostication and MRI imaging.  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 Consider MRI after 3 PM tomorrow. Frequent neurochecks Supportive care per PCCM as you are Plan discussed with Dr. Lucina Sabal, PCCM in the unit Plan also discussed with the husband at bedside ______________________________________________________________________   Signed, Tona Francis, MD [facility]   CRITICAL CARE ATTESTATION Performed by: Tona Francis, MD Total critical care time: 35 minutes Critical care time was exclusive of separately billable procedures and treating other patients and/or supervising APPs/Residents/Students Critical care was necessary to treat  or prevent imminent or life-threatening deterioration. This patient is critically ill and at significant risk for neurological worsening and/or death and care requires constant monitoring. Critical care was time spent personally by me on the following activities: development of treatment plan with patient and/or surrogate as well as nursing, discussions with consultants, evaluation of patient's response to treatment, examination of patient, obtaining history from patient or surrogate, ordering and performing treatments and interventions, ordering and review of laboratory studies, ordering and review of radiographic studies, pulse oximetry, re-evaluation of patient's condition, participation in multidisciplinary rounds and medical decision making of high complexity in the care of this patient.

## 2024-01-08 NOTE — Consult Note (Signed)
 PHARMACY - ANTICOAGULATION CONSULT NOTE  Pharmacy Consult for IV Heparin Indication: chest pain/ACS  Patient Measurements: Height: 5' 5.5" (166.4 cm) Weight: 124.7 kg (274 lb 14.6 oz) IBW/kg (Calculated) : 58.15 HEPARIN DW (KG): 88  Labs: Recent Labs    01/05/24 1839 01/05/24 2256 01/06/24 1028 01/06/24 1549 01/06/24 2327 01/07/24 0258 01/07/24 0516 01/07/24 1106 01/08/24 0412  HGB  --    < >  --   --   --  10.0* 9.9*  --  9.3*  HCT  --    < >  --   --   --  35.8* 36.0  --  33.7*  PLT  --    < >  --   --   --  485* 442*  --  317  APTT 87*  --   --   --   --   --   --   --   --   LABPROT 17.7*  --   --   --   --   --   --   --   --   INR 1.4*  --   --   --   --   --   --   --   --   HEPARINUNFRC  --    < >  --   --    < >  --  0.41 0.51 0.30  CREATININE  --   --   --  1.86*  --  1.93* 2.05*  --  1.58*  TROPONINIHS  --    < > 1,654* 1,467*  --   --  847*  --   --    < > = values in this interval not displayed.    Estimated Creatinine Clearance: 48.8 mL/min (A) (by C-G formula based on SCr of 1.58 mg/dL (H)).   Medical History: Past Medical History:  Diagnosis Date   Anemia    Anxiety    Arthritis    CHF (congestive heart failure) (HCC)    COPD (chronic obstructive pulmonary disease) (HCC)    Coronary artery disease    Diabetes mellitus without complication (HCC)    Fibromyalgia    GERD (gastroesophageal reflux disease)    Hypertension    Peripheral vascular disease (HCC)    Sleep apnea    Stroke (HCC)     Medications:  No anticoagulation prior to admission per my chart review  Assessment: 63 y/o F with medical history as above presenting to the ED 4/11 with weakness, SOB. Troponin elevated. Pharmacy consulted to manage heparin infusion for ACS.  4/11:  HL @ 2256 = < 0.1, SUBtherapeutic 4/12 0854 HL 0.15   SUBtherapeutic  1400 >> 1650 4/12 2327 HL < 0.1  SUBtherapeutic  1650 units/hr  4/13 0516 HL 0.41   Therapeutic X 1  4/13 1106 HL 0.51   Therapeutic X  2  4/14 0412 HL 0.30   Therapeutic X 3   Goal of Therapy:  Heparin level 0.3-0.7 units/ml Monitor platelets by anticoagulation protocol: Yes   Plan: heparin level therapeutic X 3 --continue heparin infusion at 1650 units/hr --recheck next heparin level in am 04/15 - Daily CBC per protocol while on IV heparin  Korra Christine D Clinical Pharmacist 01/08/2024

## 2024-01-08 NOTE — Progress Notes (Signed)
 Heart Failure Navigator Progress Note  Assessed for Heart & Vascular TOC clinic readiness.  Does not meet criteria due to current [facility] & Dr. Meredeth Stallion patient.   Navigator will sign off at this time.  Celedonio Coil, RN, BSN [facility] Heart Failure Navigator Secure Chat Only

## 2024-01-09 ENCOUNTER — Inpatient Hospital Stay

## 2024-01-09 DIAGNOSIS — I469 Cardiac arrest, cause unspecified: Secondary | ICD-10-CM | POA: Diagnosis not present

## 2024-01-09 DIAGNOSIS — G9341 Metabolic encephalopathy: Secondary | ICD-10-CM | POA: Diagnosis not present

## 2024-01-09 DIAGNOSIS — I4891 Unspecified atrial fibrillation: Secondary | ICD-10-CM | POA: Diagnosis not present

## 2024-01-09 DIAGNOSIS — R569 Unspecified convulsions: Secondary | ICD-10-CM | POA: Diagnosis not present

## 2024-01-09 DIAGNOSIS — I5041 Acute combined systolic (congestive) and diastolic (congestive) heart failure: Secondary | ICD-10-CM | POA: Diagnosis not present

## 2024-01-09 LAB — CBC
HCT: 35.1 % — ABNORMAL LOW (ref 36.0–46.0)
Hemoglobin: 9.4 g/dL — ABNORMAL LOW (ref 12.0–15.0)
MCH: 20.9 pg — ABNORMAL LOW (ref 26.0–34.0)
MCHC: 26.8 g/dL — ABNORMAL LOW (ref 30.0–36.0)
MCV: 78 fL — ABNORMAL LOW (ref 80.0–100.0)
Platelets: 366 K/uL (ref 150–400)
RBC: 4.5 MIL/uL (ref 3.87–5.11)
RDW: 17.6 % — ABNORMAL HIGH (ref 11.5–15.5)
WBC: 21.2 K/uL — ABNORMAL HIGH (ref 4.0–10.5)
nRBC: 0.5 % — ABNORMAL HIGH (ref 0.0–0.2)

## 2024-01-09 LAB — MAGNESIUM: Magnesium: 2.2 mg/dL (ref 1.7–2.4)

## 2024-01-09 LAB — BASIC METABOLIC PANEL WITH GFR
Anion gap: 11 (ref 5–15)
BUN: 42 mg/dL — ABNORMAL HIGH (ref 8–23)
CO2: 31 mmol/L (ref 22–32)
Calcium: 7.7 mg/dL — ABNORMAL LOW (ref 8.9–10.3)
Chloride: 98 mmol/L (ref 98–111)
Creatinine, Ser: 1.17 mg/dL — ABNORMAL HIGH (ref 0.44–1.00)
GFR, Estimated: 52 mL/min — ABNORMAL LOW
Glucose, Bld: 253 mg/dL — ABNORMAL HIGH (ref 70–99)
Potassium: 3.9 mmol/L (ref 3.5–5.1)
Sodium: 140 mmol/L (ref 135–145)

## 2024-01-09 LAB — GLUCOSE, CAPILLARY
Glucose-Capillary: 147 mg/dL — ABNORMAL HIGH (ref 70–99)
Glucose-Capillary: 150 mg/dL — ABNORMAL HIGH (ref 70–99)
Glucose-Capillary: 218 mg/dL — ABNORMAL HIGH (ref 70–99)
Glucose-Capillary: 221 mg/dL — ABNORMAL HIGH (ref 70–99)
Glucose-Capillary: 240 mg/dL — ABNORMAL HIGH (ref 70–99)
Glucose-Capillary: 240 mg/dL — ABNORMAL HIGH (ref 70–99)
Glucose-Capillary: 242 mg/dL — ABNORMAL HIGH (ref 70–99)

## 2024-01-09 LAB — HEPARIN LEVEL (UNFRACTIONATED): Heparin Unfractionated: 0.41 [IU]/mL (ref 0.30–0.70)

## 2024-01-09 LAB — HEPATIC FUNCTION PANEL
ALT: 150 U/L — ABNORMAL HIGH (ref 0–44)
AST: 75 U/L — ABNORMAL HIGH (ref 15–41)
Albumin: 2.3 g/dL — ABNORMAL LOW (ref 3.5–5.0)
Alkaline Phosphatase: 94 U/L (ref 38–126)
Bilirubin, Direct: 0.3 mg/dL — ABNORMAL HIGH (ref 0.0–0.2)
Indirect Bilirubin: 0.7 mg/dL (ref 0.3–0.9)
Total Bilirubin: 1 mg/dL (ref 0.0–1.2)
Total Protein: 5.6 g/dL — ABNORMAL LOW (ref 6.5–8.1)

## 2024-01-09 LAB — PHOSPHORUS: Phosphorus: 2.7 mg/dL (ref 2.5–4.6)

## 2024-01-09 MED ORDER — METOPROLOL TARTRATE 25 MG PO TABS
12.5000 mg | ORAL_TABLET | Freq: Two times a day (BID) | ORAL | Status: DC
  Administered 2024-01-09: 12.5 mg via ORAL
  Filled 2024-01-09: qty 1

## 2024-01-09 MED ORDER — DIGOXIN 0.25 MG/ML IJ SOLN
0.2500 mg | Freq: Once | INTRAMUSCULAR | Status: AC
  Administered 2024-01-09: 0.25 mg via INTRAVENOUS
  Filled 2024-01-09: qty 2

## 2024-01-09 MED ORDER — INSULIN ASPART 100 UNIT/ML IJ SOLN: 4.0000 [IU] | INTRAMUSCULAR | Status: DC

## 2024-01-09 MED ORDER — METOPROLOL TARTRATE 5 MG/5ML IV SOLN
2.5000 mg | Freq: Once | INTRAVENOUS | Status: AC
  Administered 2024-01-09: 2.5 mg via INTRAVENOUS
  Filled 2024-01-09: qty 5

## 2024-01-09 MED ORDER — INSULIN GLARGINE-YFGN 100 UNIT/ML ~~LOC~~ SOLN
12.0000 [IU] | Freq: Two times a day (BID) | SUBCUTANEOUS | Status: DC
  Administered 2024-01-09 – 2024-01-11 (×6): 12 [IU] via SUBCUTANEOUS
  Filled 2024-01-09 (×8): qty 0.12

## 2024-01-09 MED ORDER — METOPROLOL TARTRATE 25 MG PO TABS
12.5000 mg | ORAL_TABLET | Freq: Two times a day (BID) | ORAL | Status: DC
  Administered 2024-01-09 – 2024-01-11 (×5): 12.5 mg
  Filled 2024-01-09 (×5): qty 1

## 2024-01-09 MED ORDER — FAMOTIDINE 20 MG PO TABS
20.0000 mg | ORAL_TABLET | Freq: Two times a day (BID) | ORAL | Status: DC
  Administered 2024-01-09 – 2024-01-11 (×5): 20 mg
  Filled 2024-01-09 (×5): qty 1

## 2024-01-09 MED ORDER — PROPRANOLOL HCL 1 MG/ML IV SOLN
1.0000 mg | Freq: Once | INTRAVENOUS | Status: AC
Start: 2024-01-09 — End: 2024-01-09
  Administered 2024-01-09: 1 mg via INTRAVENOUS
  Filled 2024-01-09: qty 1

## 2024-01-09 MED ORDER — INSULIN ASPART 100 UNIT/ML IJ SOLN
6.0000 [IU] | INTRAMUSCULAR | Status: DC
  Administered 2024-01-09 (×2): 6 [IU] via SUBCUTANEOUS
  Filled 2024-01-09 (×2): qty 1

## 2024-01-09 MED ORDER — AMIODARONE HCL IN DEXTROSE 360-4.14 MG/200ML-% IV SOLN
60.0000 mg/h | INTRAVENOUS | Status: DC
  Filled 2024-01-09: qty 200

## 2024-01-09 MED ORDER — METOPROLOL TARTRATE 5 MG/5ML IV SOLN
5.0000 mg | Freq: Four times a day (QID) | INTRAVENOUS | Status: DC | PRN
  Administered 2024-01-09 – 2024-01-10 (×3): 5 mg via INTRAVENOUS
  Filled 2024-01-09 (×4): qty 5

## 2024-01-09 MED ORDER — SODIUM CHLORIDE 0.9 % IV SOLN
250.0000 mL | INTRAVENOUS | Status: AC
  Administered 2024-01-09: 250 mL via INTRAVENOUS

## 2024-01-09 MED ORDER — AMIODARONE LOAD VIA INFUSION
150.0000 mg | Freq: Once | INTRAVENOUS | Status: AC
  Administered 2024-01-09: 150 mg via INTRAVENOUS
  Filled 2024-01-09: qty 83.34

## 2024-01-09 MED ORDER — PHENYLEPHRINE HCL-NACL 20-0.9 MG/250ML-% IV SOLN
INTRAVENOUS | Status: AC
  Administered 2024-01-09: 20 ug/min via INTRAVENOUS
  Filled 2024-01-09: qty 250

## 2024-01-09 MED ORDER — NOREPINEPHRINE 4 MG/250ML-% IV SOLN
2.0000 ug/min | INTRAVENOUS | Status: DC
  Administered 2024-01-09: 2 ug/min via INTRAVENOUS
  Filled 2024-01-09: qty 250

## 2024-01-09 MED ORDER — PHENYLEPHRINE HCL-NACL 20-0.9 MG/250ML-% IV SOLN
0.0000 ug/min | INTRAVENOUS | Status: DC
  Administered 2024-01-09: 20 ug/min via INTRAVENOUS
  Administered 2024-01-09: 40 ug/min via INTRAVENOUS
  Filled 2024-01-09: qty 250

## 2024-01-09 MED ORDER — LABETALOL HCL 5 MG/ML IV SOLN
0.5000 mg/min | Status: DC
  Administered 2024-01-09: 0.5 mg/min via INTRAVENOUS
  Filled 2024-01-09: qty 80

## 2024-01-09 MED ORDER — MIDAZOLAM HCL 2 MG/2ML IJ SOLN
1.0000 mg | INTRAMUSCULAR | Status: DC | PRN
  Administered 2024-01-11: 2 mg via INTRAVENOUS
  Administered 2024-01-11: 1 mg via INTRAVENOUS
  Administered 2024-01-11: 2 mg via INTRAVENOUS
  Filled 2024-01-09 (×3): qty 2

## 2024-01-09 MED ORDER — LACTULOSE 10 GM/15ML PO SOLN
20.0000 g | Freq: Two times a day (BID) | ORAL | Status: AC
  Administered 2024-01-09 (×2): 20 g
  Filled 2024-01-09 (×2): qty 30

## 2024-01-09 MED ORDER — INSULIN ASPART 100 UNIT/ML IJ SOLN
0.0000 [IU] | INTRAMUSCULAR | Status: DC
  Administered 2024-01-10: 3 [IU] via SUBCUTANEOUS
  Administered 2024-01-10 (×2): 4 [IU] via SUBCUTANEOUS
  Administered 2024-01-10: 3 [IU] via SUBCUTANEOUS
  Administered 2024-01-10: 4 [IU] via SUBCUTANEOUS
  Administered 2024-01-11: 3 [IU] via SUBCUTANEOUS
  Administered 2024-01-11 (×4): 4 [IU] via SUBCUTANEOUS
  Administered 2024-01-12 (×2): 7 [IU] via SUBCUTANEOUS
  Filled 2024-01-09 (×12): qty 1

## 2024-01-09 MED ORDER — AMIODARONE HCL IN DEXTROSE 360-4.14 MG/200ML-% IV SOLN: 30.0000 mg/h | INTRAVENOUS | Status: DC

## 2024-01-09 MED ORDER — INSULIN ASPART 100 UNIT/ML IJ SOLN: 0.0000 [IU] | INTRAMUSCULAR | Status: DC

## 2024-01-09 MED ORDER — INSULIN ASPART 100 UNIT/ML IJ SOLN
4.0000 [IU] | INTRAMUSCULAR | Status: DC
  Administered 2024-01-09 – 2024-01-12 (×15): 4 [IU] via SUBCUTANEOUS
  Filled 2024-01-09 (×15): qty 1

## 2024-01-09 MED ORDER — SODIUM CHLORIDE 0.9 % IV SOLN
2.0000 g | INTRAVENOUS | Status: AC
  Administered 2024-01-10 – 2024-01-11 (×2): 2 g via INTRAVENOUS
  Filled 2024-01-09 (×2): qty 20

## 2024-01-09 NOTE — Progress Notes (Signed)
 Patient transported from ICU to MRI and from MRI back to ICU. No issues with transport.

## 2024-01-09 NOTE — Progress Notes (Signed)
 PHARMACY CONSULT NOTE  Pharmacy Consult for Electrolyte Monitoring and Replacement   Recent Labs: Potassium (mmol/L)  Date Value  01/09/2024 3.9  01/22/2015 4.2   Magnesium (mg/dL)  Date Value  29/52/8413 2.2  08/09/2014 1.2 (L)   Calcium (mg/dL)  Date Value  24/40/1027 7.7 (L)   Calcium, Total (mg/dL)  Date Value  25/36/6440 9.2   Albumin (g/dL)  Date Value  34/74/2595 2.3 (L)  08/08/2014 3.1 (L)   Phosphorus (mg/dL)  Date Value  63/87/5643 2.7   Sodium (mmol/L)  Date Value  01/09/2024 140  01/22/2015 137   Assessment: 63 yo female who presented to [facility] ER on 04/11 via EMS from home with c/o weakness and shortness of breath onset 3 days prior to presentation. Pharmacy is asked to follow and replace electrolytes while in CCU  Diuretics: Lasix 20 mg IV q12h  Goal of Therapy:  Potassium 4.0 - 5.1 mmol/L Magnesium 20 - 2.4 mg/dL All Other Electrolytes WNL  Plan:  --No electrolyte replacement indicated at this time --Re-check electrolytes in AM  Page Boast 01/09/2024 7:48 AM

## 2024-01-09 NOTE — Progress Notes (Signed)
   01/09/24 1300  Spiritual Encounters  Type of Visit Initial  Care provided to: Family (Pt in MRI; Significant other and daughter in waiting room)  Referral source Chaplain assessment  Reason for visit Urgent spiritual support  OnCall Visit No  Spiritual Framework  Presenting Themes Meaning/purpose/sources of inspiration;Goals in life/care;Significant life change;Impactful experiences and emotions;Other (comment) (for Family)  Family Stress Factors Other (Comment) (Daughter stated that she also lost her dad a few years ago and gets panic attacks under extreme stress)  Interventions  Spiritual Care Interventions Made Established relationship of care and support;Compassionate presence;Reflective listening;Normalization of emotions;Explored values/beliefs/practices/strengths;Meaning making;Prayer;Encouragement;Other (comment) (for Family)  Intervention Outcomes  Outcomes Connection to spiritual care;Awareness around self/spiritual resourses;Awareness of support;Reduced anxiety;Other (comment) (for Family)

## 2024-01-09 NOTE — Consult Note (Signed)
 PHARMACY - ANTICOAGULATION CONSULT NOTE  Pharmacy Consult for IV Heparin Indication: chest pain/ACS  Patient Measurements: Height: 5' 5.5" (166.4 cm) Weight: 128.8 kg (283 lb 15.2 oz) IBW/kg (Calculated) : 58.15 HEPARIN DW (KG): 88  Labs: Recent Labs    01/06/24 1028 01/06/24 1549 01/06/24 2327 01/07/24 0516 01/07/24 1106 01/08/24 0412 01/09/24 0539  HGB  --   --    < > 9.9*  --  9.3* 9.4*  HCT  --   --    < > 36.0  --  33.7* 35.1*  PLT  --   --    < > 442*  --  317 366  HEPARINUNFRC  --   --    < > 0.41 0.51 0.30 0.41  CREATININE  --  1.86*   < > 2.05*  --  1.58* 1.17*  TROPONINIHS 1,654* 1,467*  --  847*  --   --   --    < > = values in this interval not displayed.    Estimated Creatinine Clearance: 67.1 mL/min (A) (by C-G formula based on SCr of 1.17 mg/dL (H)).   Medical History: Past Medical History:  Diagnosis Date   Anemia    Anxiety    Arthritis    CHF (congestive heart failure) (HCC)    COPD (chronic obstructive pulmonary disease) (HCC)    Coronary artery disease    Diabetes mellitus without complication (HCC)    Fibromyalgia    GERD (gastroesophageal reflux disease)    Hypertension    Peripheral vascular disease (HCC)    Sleep apnea    Stroke (HCC)     Medications:  No anticoagulation prior to admission per my chart review  Assessment: 63 y/o F with medical history as above presenting to the ED 4/11 with weakness, SOB. Troponin elevated. Pharmacy consulted to manage heparin infusion for ACS.  4/11:  HL @ 2256 = < 0.1, SUBtherapeutic 4/12 0854 HL 0.15   SUBtherapeutic  1400 >> 1650 4/12 2327 HL < 0.1  SUBtherapeutic  1650 units/hr  4/13 0516 HL 0.41   Therapeutic X 1  4/13 1106 HL 0.51   Therapeutic X 2  4/14 0412 HL 0.30   Therapeutic X 3 4/15 0539 HL 0.41   Therapeutic x 4  Goal of Therapy:  Heparin level 0.3-0.7 units/ml Monitor platelets by anticoagulation protocol: Yes   Plan: heparin level therapeutic X 4 --continue heparin infusion  at 1650 units/hr --recheck next heparin level in am 04/16 - Daily CBC per protocol while on IV heparin  Coretta Dexter, PharmD, [facility] 01/09/2024 7:10 AM

## 2024-01-09 NOTE — Progress Notes (Signed)
 Eeg done

## 2024-01-09 NOTE — Plan of Care (Signed)
  Problem: Metabolic: Goal: Ability to maintain appropriate glucose levels will improve Outcome: Progressing   Problem: Nutritional: Goal: Maintenance of adequate nutrition will improve Outcome: Progressing   Problem: Skin Integrity: Goal: Risk for impaired skin integrity will decrease Outcome: Progressing   Problem: Elimination: Goal: Will not experience complications related to bowel motility Outcome: Progressing Pt had BM today

## 2024-01-09 NOTE — Inpatient Diabetes Management (Addendum)
 Inpatient Diabetes Program Recommendations  AACE/ADA: New Consensus Statement on Inpatient Glycemic Control (2015)  Target Ranges:  Prepandial:   less than 140 mg/dL      Peak postprandial:   less than 180 mg/dL (1-2 hours)      Critically ill patients:  140 - 180 mg/dL    Latest Reference Range & Units 01/07/24 23:43 01/08/24 04:57 01/08/24 07:37 01/08/24 11:24 01/08/24 16:36 01/08/24 19:49  Glucose-Capillary 70 - 99 mg/dL 725 (H)  5 units Novolog  215 (H)  5 units Novolog  247 (H)  5 units Novolog  258 (H)  8 units Novolog  260 (H)  12 units Novolog  263 (H)  12 units Novolog   (H): Data is abnormally high  Latest Reference Range & Units 01/09/24 00:11 01/09/24 03:30 01/09/24 07:26  Glucose-Capillary 70 - 99 mg/dL 366 (H)  9 units Novolog  218 (H)  9 units Novolog  240 (H)  (H): Data is abnormally high   Home DM Meds:  Trulicity 0.75 mg Qweek (NOT taking) Humulin R U-500 Insulin 80 units AM/ 40 units PM Metformin 500 mg BID   Current Orders:  Novolog Moderate Correction Scale/ SSI (0-15 units) Q4 hours Novolog 4 units Q4 hours for TF coverage     MD- Note Novolog Tube Feed Coverage started yest afternoon  Pt takes Concentrated U500 Insulin at home--Not appropriate to start at this time  CBGs remain >200  Please consider:  1. Start weight based dose basal insulin: Can split dose for safety Semglee 12 units BID (0.2 units/kg split)  2. Increase Novolog Tube Feed coverage to 6 units Q4 hours    --Will follow patient during hospitalization--  Langston Pippins RN, MSN, CDCES Diabetes Coordinator Inpatient Glycemic Control Team Team Pager: [phone] (8a-5p)

## 2024-01-09 NOTE — Progress Notes (Addendum)
 [facility] CARDIOLOGY PROGRESS NOTE   Patient ID: [name] MRN: [id number] DOB/AGE: December 27, 1960 63 y.o.  Admit date: 01/05/2024 Referring Physician Dr. Para March Primary Physician Sandrea Hughs, NP  Primary Cardiologist None Reason for Consultation AF RVR  HPI: [name] is a 63 y.o. female with a past medical history of coronary artery disease s/p PCI, OSA on CPAP, COPD, PAD s/p right BKA, left carotid endarterectomy , prior CVA (2021) who presented to the ED on 01/05/2024 for shortness of breath and fatigue. Patient was found to have new onset atrial fibrillation with RVR. Cardiology was consulted.  Interval History:  -Patient is intubated and sedated s/p cardiac arrest (04/13) with ROSC in 13 minutes.  -Elevated HR this AM, appears to be in atrial flutter on tele.  -Cr continues to improve today. Hgb stable today.  -BP also stable.   Review of systems complete and found to be negative unless listed above    Vitals:   01/09/24 0700 01/09/24 0800 01/09/24 0806 01/09/24 0900  BP: 106/81     Pulse:      Resp: 20     Temp: 99.5 F (37.5 C) 99.5 F (37.5 C)  99.7 F (37.6 C)  TempSrc:      SpO2:   99%   Weight:      Height:         Intake/Output Summary (Last 24 hours) at 01/09/2024 1008 Last data filed at 01/09/2024 0930 Gross per 24 hour  Intake 3326.42 ml  Output 2165 ml  Net 1161.42 ml     PHYSICAL EXAM General: Chronically ill appearing female, well nourished, in no acute distress. HEENT: Normocephalic and atraumatic. Neck: No JVD.  Lungs: Intubated. Mechanical breath sounds. Heart: HRRR. Normal S1 and S2 without gallops or murmurs. Radial & DP pulses 2+ bilaterally. Abdomen: Non-distended appearing.  Msk: Normal strength and tone for age. Extremities: No clubbing, cyanosis or edema. Right BKA. Neuro: Alert and oriented X 3. Psych: Mood appropriate, affect congruent.    LABS: Basic Metabolic Panel: Recent Labs    01/08/24 0412 01/09/24 0539   NA 140 140  K 3.4* 3.9  CL 98 98  CO2 31 31  GLUCOSE 229* 253*  BUN 45* 42*  CREATININE 1.58* 1.17*  CALCIUM 7.9* 7.7*  MG 2.3 2.2  PHOS 3.7 2.7   Liver Function Tests: Recent Labs    01/07/24 0516 01/09/24 0539  AST 90* 75*  ALT 110* 150*  ALKPHOS 101 94  BILITOT 0.9 1.0  PROT 6.4* 5.6*  ALBUMIN 2.8* 2.3*   No results for input(s): "LIPASE", "AMYLASE" in the last 72 hours.  CBC: Recent Labs    01/08/24 0412 01/09/24 0539  WBC 26.2* 21.2*  HGB 9.3* 9.4*  HCT 33.7* 35.1*  MCV 74.9* 78.0*  PLT 317 366   Cardiac Enzymes: Recent Labs    01/06/24 1028 01/06/24 1549 01/07/24 0516  TROPONINIHS 1,654* 1,467* 847*   BNP: Recent Labs    01/06/24 1549  BNP 750.8*   D-Dimer: No results for input(s): "DDIMER" in the last 72 hours. Hemoglobin A1C: No results for input(s): "HGBA1C" in the last 72 hours.  Fasting Lipid Panel: Recent Labs    01/08/24 0412  TRIG 154*   Thyroid Function Tests: Recent Labs    01/06/24 1658  T3FREE 2.4   Anemia Panel: No results for input(s): "VITAMINB12", "FOLATE", "FERRITIN", "TIBC", "IRON", "RETICCTPCT" in the last 72 hours.  EEG adult Result Date: 01/08/2024 Charlsie Quest, MD  01/08/2024  1:05 PM Patient Name: [name] MRN: [id number] Epilepsy Attending: Charlsie Quest Referring Physician/Provider: Ezequiel Essex, NP Date: 01/08/2024 Duration: 29.28 mins Patient history:  63 yo F s/p cardiac arrest developed persistent eyelid myoclonus which has now resolved. EEG to evaluate for seizure Level of alertness:  comatose AEDs during EEG study: LEV, propofol Technical aspects: This EEG study was done with scalp electrodes positioned according to the 10-20 International system of electrode placement. Electrical activity was reviewed with band pass filter of 1-70Hz , sensitivity of 7 uV/mm, display speed of 33mm/sec with a 60Hz  notched filter applied as appropriate. EEG data were recorded continuously and digitally stored.   Video monitoring was available and reviewed as appropriate. Description: EEG showed continuous generalized and maximal bifrontal polymorphic sharply contoured 3 to 6 Hz theta-delta slowing. Hyperventilation and photic stimulation were not performed.   ABNORMALITY - Continuous slow, generalized and maximal bifrontal IMPRESSION: This study is suggestive of cortical dysfunction arising from bifrontal region likely secondary to underlying structural abnormality. Additionally there is moderate to severe diffuse encephalopathy. No seizures or epileptiform discharges were seen throughout the recording. [name]     ECHO as above  TELEMETRY reviewed by me 01/09/24: atrial flutter rate 130s  EKG reviewed by me 01/09/24: atrial fibrillations with RBBB, rate 139 bpm  DATA reviewed by me 01/09/24: last 24h vitals tele labs imaging I/O, hospitalist, critical care, neurology progress notes  Principal Problem:   Atrial fibrillation with rapid ventricular response (HCC) Active Problems:   Essential hypertension, benign   Obesity, Class III, BMI 40-49.9 (morbid obesity) (HCC)   PAD (peripheral artery disease) (HCC)   Uncontrolled type 2 diabetes mellitus with hypoglycemia, with long-term current use of insulin (HCC)   NSTEMI (non-ST elevated myocardial infarction) (HCC)   CAD S/P percutaneous coronary angioplasty   OSA on CPAP   Hx of right BKA (HCC)   History of left-sided carotid endarterectomy   Acute CHF (congestive heart failure) (HCC)   Acute hypercapnic respiratory failure (HCC)   Cardiac arrest (HCC)    ASSESSMENT AND PLAN:  [name] is a 63 y.o. female with a past medical history of coronary artery disease s/p PCI, OSA on CPAP, COPD, PAD s/p right BKA, left carotid endarterectomy , prior CVA (2021) who presented to the ED on 01/05/2024 for shortness of breath and fatigue. Patient was found to have new onset atrial fibrillation with RVR. Cardiology was consulted.  # Cardiac  arrest # Acute respiratory failure  # New onset atrial fibrillation RVR  # Acute HFrEF with grade II diastolic dysfunction # NSTEMI, Hx Coronary artery disease s/p PCI # Hx of CVA (2021) Patient presented to the ED on 04/11 with shortness of breath, fatigue, palpitations and EKG revealed new onset atrial fibrillation with RVR. CT revealed right pleural effusion, pulmonary edema and no evidence of pulmonary embolism. CXR showed cardiomegaly. Trops elevated and trending 1,367 > 1,686 > 1,827 > 1,654 > 1,467 > 847. BNP elevated 1,263 > 750. Echo this admission shows EF 35-40%, global hypokinesis, borderline LVH, grade II diastolic dysfunction. IV metoprolol x2 given with no significant improvement in HR. IV dilt infusion started for tachycardia in 130s then stopped due to reduced EF. IV amio infusion started for atrial fibrillation on 04/13 and about 20 minutes later patient became bradycardia then pulseless asystole Code blue was called for Cardiac arrest with ROSC obtained 13 minutes. Patient remains intubated and on propofol. AFL on tele this AM with HR 130s.  -  Given concern for reaction to amiodarone on 4/13 with arrest shortly after initiation of IV amio infusion, will start IV labetalol for HR. Monitor BP closely. -Will continue heparin gtt given atrial flutter.  -Continue ASA 81 mg, atorvastatin 40 mg per tube. -Continue IV lasix 40 mg BID. Closely monitor renal function and UOP. -Defer GDMT until patient is extubated and able to take pills, renal function stabilizes. -No plan for further cardiac diagnostics at this time as patient is intubated. -PCCM has consulted neurology who are assessing neurologic function.  This patient's case was discussed and created with Dr. Custovic and she is in agreement.  Signed:  Hamp Levine, PA-C  01/09/2024, 10:08 AM [facility] Card[facility]

## 2024-01-09 NOTE — Progress Notes (Signed)
 NAME:  [name], MRN:  [id number], DOB:  23-Jul-1961, LOS: 4 ADMISSION DATE:  01/05/2024, CONSULTATION DATE: 01/06/2024 REFERRING MD: Dr. Joylene Igo, CHIEF COMPLAINT: Cardiac Arrest    Brief Pt Description / Synopsis:  63 y.o. female admitted with new onset A.fib with RVR, NSTEMI, and decompensated HFrEF.  On 01/06/24, Hospital course complicated by bradycardia with progression to Asystole Cardiac Arrest (ROSC after 5 minutes).  Post arrest with development of eyelid myoclonus and concern for anoxic brain injury.   History of Present Illness:  This is a 63 yo female who presented to [facility] ER on 04/11 via EMS from home with c/o weakness and shortness of breath onset 3 days prior to presentation.   At baseline she wears CPAP at night, but is not on chronic O2.  Per EMS pt found to be in atrial fibrillation with rvr.  She does not have a hx of atrial fibrillation.  She was also hypoglycemic CBG 46, EMS administered glucagon.  However, she remained hypoglycemic and received 1 amp of D50.  CBG stabilized.    ED Course  Upon arrival to the ER initial EKG revealed atrial fibrillation with right bundle branch block, hr 139 but no acute ST elevation or depression present.  She received 1L iv fluid bolus.  Significant lab results were: glucose 58/BUN 27/creatinine 1.21/albumin 3.1/BNP 1,263.5/troponin 1,367/wbc 11.9/hgb 9.7/platelet count 450.  CTA Chest negative for PE, but concerning for interstitial edema.  She received 5 mg of iv metoprolol x2 with no significant improvement in heart rate.  Heparin bolus followed by heparin gtt administered for possible NSTEMI.  She was subsequently admitted to the progressive care unit per hospitalist team for additional workup and treatment, however she remained in the ER pending bed availability.  See detailed hospital course below under significant events.   CTA Chest PE:  No evidence acute pulmonary embolism. RIGHT pleural effusion with RIGHT basilar atelectasis. Mild  interstitial thickening at the lung bases suggest interstitial edema. Ill-defined ground-glass densities in the upper lobes. Findings suggest mild pulmonary edema. Benign LEFT adrenal adenoma.  CXR: Detail limited exam without definite plain film evidence of acute process. Enlarged heart.  Pertinent  Medical History  Anemia  Anxiety  Arthritis  COPD CAD Type II Diabetes Mellitus  Fibromyalgia  GERD  HTN  PVD OSA Stroke  CHF   Micro Data:  4/11: SARS-CoV-2/flu/RSV PCR>>Negative 04/11: HIV screen>> nonreactive 4/12: MRSA PCR>> negative 4/12: Blood culture x 2>> no growth to date  Antimicrobials:   Anti-infectives (From admission, onward)    Start     Dose/Rate Route Frequency Ordered Stop   01/07/24 1100  piperacillin-tazobactam (ZOSYN) IVPB 3.375 g        3.375 g 12.5 mL/hr over 240 Minutes Intravenous Every 8 hours 01/07/24 1001        Significant Hospital Events: Including procedures, antibiotic start and stop dates in addition to other pertinent events   04/11: Pt admitted to the progressive care unit with new onset atrial fibrillation with rvr, acute respiratory failure secondary to acute CHF exacerbation, and NSTEMI.   04/12: Pt remained in atrial fibrillation with rvr amiodarone bolus administered followed by amiodarone gtt at 60 mg/hr.  Following administration she became bradycardic and pulseless ACLS protocol initiated with ROSC 13 minutes following initiation of ACLS protocol.  Pt mechanically intubated during cardiac arrest.  Post cardiac arrest pt comatose, unable to follow commands, with no gag/oculocephalic reflexes present despite NOT receiving RSI medications and intermittent eyelid myoclonus present neurology consulted  and cerebell initiated 04/13: Pt with worsening myoclonus, cerebell remains in place and noted to have myoclonic seizures every 1-2 minutes suggestive of anoxic/hypoxic brain injury.  Propofol gtt ordered for suppression.  04/14: Plan for  repeat EEG today.  On Neuro exam, currently off propofol, no myoclonus noted.  Does have + brainstem reflexes, somewhat opening eyes to noxious stimulation.  Allowing time for neuro prognostication, plan for MRI Brain tomorrow. 04/15: Overnight/this morning developed A.flutter w/ RVR.  Afebrile, requiring low dose Neosynephrine. Currently sedated on Propofol, will wean for Neuro exam. Repeating EEG, and plan for MRI Brain today.  Interim History / Subjective:  As outlined above under significant events   Objective   Blood pressure 106/81, pulse (!) 51, temperature 99.1 F (37.3 C), resp. rate 20, height 5' 5.5" (1.664 m), weight 128.8 kg, SpO2 98%.    Vent Mode: PRVC FiO2 (%):  [28 %-30 %] 28 % Set Rate:  [20 bmp] 20 bmp Vt Set:  [450 mL] 450 mL PEEP:  [5 cmH20] 5 cmH20 Plateau Pressure:  [18 cmH20] 18 cmH20   Intake/Output Summary (Last 24 hours) at 01/09/2024 0744 Last data filed at 01/09/2024 1610 Gross per 24 hour  Intake 3044.3 ml  Output 2665 ml  Net 379.3 ml   Filed Weights   01/07/24 0400 01/08/24 0508 01/09/24 0500  Weight: 124.7 kg 127.2 kg 128.8 kg   Examination: General: Acutely-ill appearing obese female, NAD mechanically intubated  HENT: Supple, unable to assess for JVD due to body habitus, orally intubated Lungs: Faint rhonchi throughout, even, non labored, overbreathing the vent Cardiovascular: NSR, s1s2, no m/r/g, 2+ radial/1+ left distal pulse, 1+ right femoral pulse, no edema  Abdomen: +BS x4, obese, soft, non distended  Extremities: Right BKA  Neuro: On Propofol, not following commands or withdrawing from stimulation, + cough/gag/corneal reflexes, bilateral pupils very sluggish, no myoclonus GU: Indwelling foley catheter present draining yellow urine   Resolved Hospital Problem list     Assessment & Plan:   #Acute metabolic encephalopathy  #Intermittent eyelid myoclonus concerning for possible anoxic brain injury post cardiac arrest Hx: Anxiety  CT Head  01/06/24: No evidence of acute intracranial abnormality. Severe chronic small vessel ischemic disease EEG 4/14 negative for myoclonus  - Treatment of metabolic derangements as outlined below - Maintain RASS goal of 0 to -1 - Propofol as needed for suppression of myoclonus per neuro recommendations  - Avoid sedating medications as able  - Continue scheduled keppra 1g q12rs for myoclonus  - Repeat EEG 04/15 - Neurology following, appreciate input: recommending deferring MRI Brain until 72 hrs post cardiac arrest on 4/15 for prognostication - Seizure precautions  - Normothermia protocol  - WUA daily   #Cardiac arrest, suspect secondary to amiodarone (asystole) #NSTEMI  #New-onset atrial fibrillation with rvr / A-flutter w/ RVR #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  Hx: HTN, PVD, stroke, and CAD  Echo 01/06/2024: EF 35 to 40%; grade II diastolic dysfunction  - Continuous telemetry monitoring  - Will hold antiarrhythmics for now  - Continue heparin gtt: dosing per pharmacy  - Diurese as bp and renal function permits  - Hold antihypertensives for now  - Maintain map 65 or higher  - Continue aspirin and atorvastatin  - Cardiology following, appreciate input  - Troponin peaked at 1,827 - TSH normal: 2.6  #Acute hypoxic respiratory failure  #Mechanical intubation  Hx: COPD and OSA  -Full vent support, implement lung protective strategies -Plateau pressures less than 30 cm H20 -Wean  FiO2 & PEEP as tolerated to maintain O2 sats 88 to 92% -Follow intermittent Chest X-ray & ABG as needed -Spontaneous Breathing Trials when respiratory parameters met and mental status permits ~ MENTAL STATUS CURRENTLY PRECLUDING EXTUBATION - Implement VAP Bundle - Prn Bronchodilators - Diuresis as BP and renal function permits ~ currently on 20 mg IV Lasix BID - ABX as above  #Acute kidney injury secondary to ATN ~ improving #Severe metabolic acidosis ~ resolved #Lactic acidosis  #Mild  Hypokalemia - Monitor I&O's / urinary output - Follow BMP - Ensure adequate renal perfusion - Avoid nephrotoxic agents as able - Replace electrolytes as indicated ~ Pharmacy following for assistance with electrolyte replacement - D/c bicarb gtt 4/14  #Transamanitis  - Trend hepatic function panel  - Avoid hepatotoxic agents as able   #Anemia without obvious signs of bleeding  - Trend CBC  - Monitor for s/sx of bleeding  - Transfuse for hgb <7   #Type II diabetes mellitus  #Hypoglycemia~resolved  - CBG's q4hrs  - SSI  - Follow hypo/hyperglycemic protocol  - Target CBG range 140 to 180    Pt with worsening myoclonus cerebell showing myoclonic seizures every 1-2 minutes suggestive of anoxic/hypoxic brain injury.  Pt with poor prognosis, recommend changing code status to DO NOT RESUSCITATE.  Allowing time for Neuro prognostication. Depending on clinical course, may need to consult Palliative Care.  Best Practice (right click and "Reselect all SmartList Selections" daily)   Diet/type: NPO; dietitian consulted for TF's  DVT prophylaxis systemic heparin Pressure ulcer(s): N/A GI prophylaxis: PPI Lines: N/A Foley:  Yes, and it is still needed Code Status:  full code Last date of multidisciplinary goals of care discussion [01/09/2024]  04/15: Pts husband updated at bedside on plan of care.  Labs   CBC: Recent Labs  Lab 01/05/24 1547 01/06/24 0431 01/07/24 0258 01/07/24 0516 01/08/24 0412 01/09/24 0539  WBC 11.9* 12.6* 25.2* 25.5* 26.2* 21.2*  NEUTROABS 7.9*  --   --   --   --   --   HGB 9.7* 8.9* 10.0* 9.9* 9.3* 9.4*  HCT 35.5* 32.9* 35.8* 36.0 33.7* 35.1*  MCV 77.9* 76.0* 75.8* 75.2* 74.9* 78.0*  PLT 450* 417* 485* 442* 317 366    Basic Metabolic Panel: Recent Labs  Lab 01/06/24 1549 01/07/24 0258 01/07/24 0516 01/08/24 0412 01/09/24 0539  NA 137 136 136 140 140  K 5.0 4.6 4.5 3.4* 3.9  CL 103 100 101 98 98  CO2 19* 22 23 31 31   GLUCOSE 160* 197* 198*  229* 253*  BUN 38* 44* 43* 45* 42*  CREATININE 1.86* 1.93* 2.05* 1.58* 1.17*  CALCIUM 8.4* 8.3* 8.4* 7.9* 7.7*  MG 2.5* 2.7* 2.3 2.3 2.2  PHOS 7.0* 6.0* 5.7* 3.7 2.7   GFR: Estimated Creatinine Clearance: 67.1 mL/min (A) (by C-G formula based on SCr of 1.17 mg/dL (H)). Recent Labs  Lab 01/06/24 1658 01/07/24 0258 01/07/24 0516 01/08/24 0412 01/09/24 0539  WBC  --  25.2* 25.5* 26.2* 21.2*  LATICACIDVEN 4.9*  --  2.7*  --   --     Liver Function Tests: Recent Labs  Lab 01/05/24 1547 01/06/24 1549 01/07/24 0258 01/07/24 0516 01/09/24 0539  AST 24 101*  --  90* 75*  ALT 26 84*  --  110* 150*  ALKPHOS 95 99  --  101 94  BILITOT 0.6 1.0  --  0.9 1.0  PROT 7.2 6.2*  --  6.4* 5.6*  ALBUMIN 3.1* 2.7* 2.9* 2.8* 2.3*  Recent Labs  Lab 01/05/24 1547  LIPASE 21   No results for input(s): "AMMONIA" in the last 168 hours.  ABG    Component Value Date/Time   PHART 7.04 (LL) 01/06/2024 1354   PCO2ART 62 (H) 01/06/2024 1354   PO2ART 116 (H) 01/06/2024 1354   HCO3 24.4 01/06/2024 1925   ACIDBASEDEF 3.8 (H) 01/06/2024 1925   O2SAT 76 01/06/2024 1925     Coagulation Profile: Recent Labs  Lab 01/05/24 1839  INR 1.4*    Cardiac Enzymes: No results for input(s): "CKTOTAL", "CKMB", "CKMBINDEX", "TROPONINI" in the last 168 hours.  HbA1C: Hgb A1c MFr Bld  Date/Time Value Ref Range Status  01/05/2024 10:56 PM 7.2 (H) 4.8 - 5.6 % Final    Comment:    (NOTE)         Prediabetes: 5.7 - 6.4         Diabetes: >6.4         Glycemic control for adults with diabetes: <7.0   09/23/2020 07:10 AM 8.6 (H) 4.8 - 5.6 % Final    Comment:    (NOTE) Pre diabetes:          5.7%-6.4%  Diabetes:              >6.4%  Glycemic control for   <7.0% adults with diabetes     CBG: Recent Labs  Lab 01/08/24 1636 01/08/24 1949 01/09/24 0011 01/09/24 0330 01/09/24 0726  GLUCAP 260* 263* 240* 218* 240*    Review of Systems:   Unable to assess pt mechanically  intubated/sedation/critically ill  Past Medical History:  She,  has a past medical history of Anemia, Anxiety, Arthritis, CHF (congestive heart failure) (HCC), COPD (chronic obstructive pulmonary disease) (HCC), Coronary artery disease, Diabetes mellitus without complication (HCC), Fibromyalgia, GERD (gastroesophageal reflux disease), Hypertension, Peripheral vascular disease (HCC), Sleep apnea, and Stroke (HCC).   Surgical History:   Past Surgical History:  Procedure Laterality Date   CESAREAN SECTION     COLONOSCOPY WITH PROPOFOL N/A 08/02/2022   Procedure: COLONOSCOPY WITH PROPOFOL;  Surgeon: Marnee Sink, MD;  Location: [facility] ENDOSCOPY;  Service: Endoscopy;  Laterality: N/A;   CORONARY ANGIOPLASTY WITH STENT PLACEMENT Left    DILATION AND CURETTAGE OF UTERUS     ENDARTERECTOMY Left 01/29/2015   Procedure: ENDARTERECTOMY CAROTID;  Surgeon: Celso College, MD;  Location: [facility] ORS;  Service: Vascular;  Laterality: Left;   LOWER EXTREMITY ANGIOGRAPHY Left 09/24/2020   Procedure: Lower Extremity Angiography;  Surgeon: Celso College, MD;  Location: [facility] INVASIVE CV LAB;  Service: Cardiovascular;  Laterality: Left;   PERIPHERAL VASCULAR CATHETERIZATION Right 05/27/2015   Procedure: Lower Extremity Angiography;  Surgeon: Celso College, MD;  Location: [facility] INVASIVE CV LAB;  Service: Cardiovascular;  Laterality: Right;   PERIPHERAL VASCULAR CATHETERIZATION  05/27/2015   Procedure: Lower Extremity Intervention;  Surgeon: Celso College, MD;  Location: [facility] INVASIVE CV LAB;  Service: Cardiovascular;;   PERIPHERAL VASCULAR CATHETERIZATION Right 09/22/2016   Procedure: Lower Extremity Angiography;  Surgeon: Celso College, MD;  Location: [facility] INVASIVE CV LAB;  Service: Cardiovascular;  Laterality: Right;   TUBAL LIGATION       Social History:   reports that she quit smoking about 7 years ago. Her smoking use included cigarettes. She started smoking about 45 years ago. She has a 9.5 pack-year smoking history. She  uses smokeless tobacco. She reports that she does not drink alcohol and does not use drugs.   Family History:  Her family history  is not on file.   Allergies Allergies  Allergen Reactions   Niacin And Related Nausea And Vomiting     Home Medications  Prior to Admission medications   Medication Sig Start Date End Date Taking? Authorizing Provider  atorvastatin (LIPITOR) 40 MG tablet Take 40 mg by mouth every evening.   Yes [provider]  buPROPion (WELLBUTRIN XL) 300 MG 24 hr tablet Take 300 mg by mouth daily. Pt taking 300 mg by mouth daily   Yes [provider]  cetirizine (ZYRTEC) 10 MG tablet Take 10 mg by mouth daily.   Yes [provider]  DULoxetine (CYMBALTA) 60 MG capsule Take 60 mg by mouth daily.   Yes [provider]  enalapril (VASOTEC) 5 MG tablet Take 5 mg by mouth daily.   Yes [provider]  HUMULIN R U-500 KWIKPEN 500 UNIT/ML kwikpen Inject 40-80 Units into the skin See admin instructions. Inject 80u under the skin every morning before breakfast and inject 40u under the skin every evening before supper   Yes [provider]  metFORMIN (GLUCOPHAGE-XR) 500 MG 24 hr tablet Take 500 mg by mouth 2 (two) times daily with a meal.   Yes [provider]  OLANZapine (ZYPREXA) 5 MG tablet Take 5 mg by mouth daily. 02/11/21  Yes [provider]  oxybutynin (DITROPAN-XL) 10 MG 24 hr tablet Take 10 mg by mouth daily.   Yes [provider]  pantoprazole (PROTONIX) 40 MG tablet Take 40 mg by mouth daily.   Yes [provider]  traZODone (DESYREL) 50 MG tablet Take 100 mg by mouth at bedtime.   Yes [provider]  collagenase (SANTYL) ointment Apply topically daily. Patient not taking: Reported on 01/05/2024 09/26/20   Alphonsus Jeans, MD  Dulaglutide (TRULICITY) 0.75 MG/0.5ML SOPN Inject 0.75 mg into the skin every Tuesday. Patient not taking: Reported on 06/24/2022    [provider]  gabapentin (NEURONTIN) 300 MG capsule Take 300-600 mg by mouth at bedtime. Patient not taking: Reported on 01/05/2024    [provider]     Critical care time: 40 minutes     Cherylann Corpus, AGACNP-BC [facility] Prefer epic messenger for cross cover needs If after hours, please call E-link

## 2024-01-09 NOTE — Progress Notes (Signed)
 NEUROLOGY CONSULT FOLLOW UP NOTE   Date of service: January 09, 2024 Patient Name: [name] MRN:  [id number] DOB:  11-Apr-1961  Interval Hx/subjective  Seen and examined.  Sedation off since this morning.  Vitals   Vitals:   01/09/24 1015 01/09/24 1030 01/09/24 1045 01/09/24 1145  BP: (!) 126/102 (!) 132/104 121/86   Pulse:  90    Resp: (!) 23 (!) 24 (!) 23   Temp:    100 F (37.8 C)  TempSrc:      SpO2:  92%    Weight:      Height:         Body mass index is 46.53 kg/m.  Physical Exam  General: Not been on sedation this morning HEENT: Normocephalic atraumatic Lungs: Vented Cardiovascular: Irregularly irregular Neurological exam Comatose, no response to voice. To noxious stimulation, there was some delayed eye-opening, but still does not track.. Nonverbal, does not follow any commands Cranial nerves: Pupils 3 mm, round, sluggishly reactive, gaze somewhat disconjugate and downward today, positive corneals, positive cough and gag,  Motor: No spontaneous movement Sensory exam: No withdrawal or attempted localize on noxious stimulation Coordination cannot be assessed  Medications  Current Facility-Administered Medications:    0.9 %  sodium chloride infusion, 250 mL, Intravenous, Continuous, Assaker, Jean-Pierre, MD   acetaminophen (TYLENOL) tablet 650 mg, 650 mg, Oral, Q4H PRN, Andris Baumann, MD   aspirin chewable tablet 81 mg, 81 mg, Per Tube, Daily, Tressie Ellis, RPH, 81 mg at 01/09/24 1042   atorvastatin (LIPITOR) tablet 40 mg, 40 mg, Per Tube, Daily, Tressie Ellis, RPH, 40 mg at 01/09/24 1042   [START ON 01/10/2024] cefTRIAXone (ROCEPHIN) 2 g in sodium chloride 0.9 % 100 mL IVPB, 2 g, Intravenous, Q24H, Assaker, [place], MD   Chlorhexidine Gluconate Cloth 2 % PADS 6 each, 6 each, Topical, Daily, Ezequiel Essex, NP, 6 each at 01/08/24 0837   dextrose 50 % solution 50 mL, 1 ampule, Intravenous, PRN, Andris Baumann, MD, 50 mL at 01/05/24 2304   docusate  (COLACE) 50 MG/5ML liquid 100 mg, 100 mg, Per Tube, BID, Ezequiel Essex, NP, 100 mg at 01/09/24 1041   feeding supplement (PROSource TF20) liquid 60 mL, 60 mL, Per Tube, BID, Kasa, Kurian, MD, 60 mL at 01/09/24 1043   feeding supplement (VITAL AF 1.2 CAL) liquid 1,000 mL, 1,000 mL, Per Tube, Continuous, Assaker, Jean-Pierre, MD, Last Rate: 50 mL/hr at 01/09/24 1052, Infusion Verify at 01/09/24 1052   fentaNYL (SUBLIMAZE) injection 50-200 mcg, 50-200 mcg, Intravenous, Q30 min PRN, Harlon Ditty D, NP, 100 mcg at 01/09/24 0217   free water 30 mL, 30 mL, Per Tube, Q4H, Assaker, Jean-Pierre, MD, 30 mL at 01/09/24 1104   furosemide (LASIX) injection 20 mg, 20 mg, Intravenous, Q12H, Lindajo Royal V, MD, 20 mg at 01/09/24 0841   heparin ADULT infusion 100 units/mL (25000 units/252mL), 1,650 Units/hr, Intravenous, Continuous, Kasa, Kurian, MD, Last Rate: 16.5 mL/hr at 01/09/24 1052, 1,650 Units/hr at 01/09/24 1052   hydrALAZINE (APRESOLINE) injection 5 mg, 5 mg, Intravenous, Q4H PRN, Tukov-Yual, Magdalene S, NP, 5 mg at 01/07/24 0445   influenza vac split trivalent PF (FLULAVAL) injection 0.5 mL, 0.5 mL, Intramuscular, Tomorrow-1000, Kasa, Kurian, MD   insulin aspart (novoLOG) injection 0-15 Units, 0-15 Units, Subcutaneous, Q4H, Ezequiel Essex, NP, 5 Units at 01/09/24 1139   insulin aspart (novoLOG) injection 6 Units, 6 Units, Subcutaneous, Q4H, Assaker, [place], MD, 6 Units at 01/09/24 1139   insulin glargine-yfgn (  SEMGLEE) injection 12 Units, 12 Units, Subcutaneous, BID, Assaker, Jean-Pierre, MD, 12 Units at 01/09/24 1140   ipratropium-albuterol (DUONEB) 0.5-2.5 (3) MG/3ML nebulizer solution 3 mL, 3 mL, Nebulization, Q6H PRN, Ezequiel Essex, NP   lactulose (CHRONULAC) 10 GM/15ML solution 20 g, 20 g, Per Tube, BID, Assaker, [place], MD, 20 g at 01/09/24 1041   levETIRAcetam (KEPPRA) IVPB 1000 mg/100 mL premix, 1,000 mg, Intravenous, Q12H, Jefferson Fuel, MD, Stopped at 01/09/24 0410   metoprolol  tartrate (LOPRESSOR) injection 5 mg, 5 mg, Intravenous, Q6H PRN, Hudson, Caralyn, PA-C   midazolam (VERSED) injection 2-4 mg, 2-4 mg, Intravenous, Q1H PRN, Tukov-Yual, Magdalene S, NP, 2 mg at 01/08/24 0102   nitroGLYCERIN (NITROSTAT) SL tablet 0.4 mg, 0.4 mg, Sublingual, Q5 Min x 3 PRN, Andris Baumann, MD   norepinephrine (LEVOPHED) 4mg  in (0.016 mg/mL) premix infusion, 2-10 mcg/min, Intravenous, Titrated, Assaker, Jean-Pierre, MD, Last Rate: 7.5 mL/hr at 01/09/24 1052, 2 mcg/min at 01/09/24 1052   ondansetron (ZOFRAN) injection 4 mg, 4 mg, Intravenous, Q6H PRN, Andris Baumann, MD   Oral care mouth rinse, 15 mL, Mouth Rinse, Q2H, Kasa, Kurian, MD, 15 mL at 01/09/24 1141   Oral care mouth rinse, 15 mL, Mouth Rinse, PRN, Erin Fulling, MD, 15 mL at 01/06/24 2200   pantoprazole (PROTONIX) injection 40 mg, 40 mg, Intravenous, QHS, Lowella Bandy, RPH, 40 mg at 01/08/24 2147   piperacillin-tazobactam (ZOSYN) IVPB 3.375 g, 3.375 g, Intravenous, Q8H, Assaker, [place], MD, Stopped at 01/09/24 0934   pneumococcal 20-valent conjugate vaccine (PREVNAR 20) injection 0.5 mL, 0.5 mL, Intramuscular, Tomorrow-1000, Kasa, Kurian, MD   polyethylene glycol (MIRALAX / GLYCOLAX) packet 17 g, 17 g, Per Tube, Daily, Ezequiel Essex, NP, 17 g at 01/09/24 1042   propofol (DIPRIVAN) 1000 MG/100ML infusion, 5-80 mcg/kg/min, Intravenous, Titrated, Ezequiel Essex, NP, Last Rate: 3.74 mL/hr at 01/09/24 1105, 5 mcg/kg/min at 01/09/24 1105   thiamine (VITAMIN B1) tablet 100 mg, 100 mg, Per Tube, Daily, Erin Fulling, MD, 100 mg at 01/09/24 1042  Labs and Diagnostic Imaging   CBC:  Recent Labs  Lab 01/05/24 1547 01/06/24 0431 01/08/24 0412 01/09/24 0539  WBC 11.9*   < > 26.2* 21.2*  NEUTROABS 7.9*  --   --   --   HGB 9.7*   < > 9.3* 9.4*  HCT 35.5*   < > 33.7* 35.1*  MCV 77.9*   < > 74.9* 78.0*  PLT 450*   < > 317 366   < > = values in this interval not displayed.    Basic Metabolic Panel:  Lab Results   Component Value Date   NA 140 01/09/2024   K 3.9 01/09/2024   CO2 31 01/09/2024   GLUCOSE 253 (H) 01/09/2024   BUN 42 (H) 01/09/2024   CREATININE 1.17 (H) 01/09/2024   CALCIUM 7.7 (L) 01/09/2024   GFRNONAA 52 (L) 01/09/2024   GFRAA >60 09/20/2016   Lipid Panel: No results found for: "LDLCALC" HgbA1c:  Lab Results  Component Value Date   HGBA1C 7.2 (H) 01/05/2024    INR  Lab Results  Component Value Date   INR 1.4 (H) 01/05/2024   APTT  Lab Results  Component Value Date   APTT 87 (H) 01/05/2024   CT Head without contrast(Personally reviewed): 01/06/2024-no acute intracranial process  Assessment   ALLIS QUIRARTE is a 63 y.o. female past medical history of CAD status post PCI, PAD status post right BKA, left carotid endarterectomy, prior stroke in  2021, morbid obesity with sleep apnea on CPAP, COPD, left-sided diabetic hemichorea who was admitted with new onset atrial fibrillation with RVR, NSTEMI and acute decompensated heart failure, acute respiratory failure requiring oxygen supplementation who suffered an inpatient cardiac arrest on the evening of 01/06/2024 after a brief bout of severe bradycardia. Time to ROSC was 5 minutes.  Immediately after ROSC developed, she had persistent eyelid myoclonus which then resolved. Remained comatose on exam over the last 2 days for the outgoing neurologist who handed off her care to me. She was a GCS of 3, now may be a GCS of 4 with delayed eye-opening to noxious stimulation today. 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 Frequent neurochecks Supportive care per PCCM as you are Plan  discussed with the PCCM team Plan also discussed with the husband and daughter at bedside ______________________________________________________________________   Signed, Tona Francis, MD [facility]   CRITICAL CARE ATTESTATION Performed by: Tona Francis, MD Total critical care time: 30 minutes Critical care time was exclusive of separately billable procedures and treating other patients and/or supervising APPs/Residents/Students Critical care was necessary to treat or prevent imminent or life-threatening deterioration. This patient is critically ill and at significant risk for neurological worsening and/or death and care requires constant monitoring. Critical care was time spent personally by me on the following activities: development of treatment plan with patient and/or surrogate as well as nursing, discussions with consultants, evaluation of patient's response to treatment, examination of patient, obtaining history from patient or surrogate, ordering and performing treatments and interventions, ordering and review of laboratory studies, ordering and review of radiographic studies, pulse oximetry, re-evaluation of patient's condition, participation in multidisciplinary rounds and medical decision making of high complexity in the care of this patient.

## 2024-01-09 NOTE — Procedures (Signed)
 Patient Name: [name]  MRN: [id number]  Epilepsy Attending: Arleene Lack  Referring Physician/Provider: Tona Francis, MD  Date: 01/09/2024 Duration: 31.53 mins   Patient history:  63 yo F s/p cardiac arrest developed persistent eyelid myoclonus which has now resolved. EEG to evaluate for seizure   Level of alertness:  comatose   AEDs during EEG study: LEV, propofol   Technical aspects: This EEG study was done with scalp electrodes positioned according to the 10-20 International system of electrode placement. Electrical activity was reviewed with band pass filter of 1-70Hz , sensitivity of 7 uV/mm, display speed of 85mm/sec with a 60Hz  notched filter applied as appropriate. EEG data were recorded continuously and digitally stored.  Video monitoring was available and reviewed as appropriate.   Description: EEG showed continuous generalized background suppression. Hyperventilation and photic stimulation were not performed.      ABNORMALITY - Background suppression, generalized   IMPRESSION: This study is suggestive of profound diffuse encephalopathy. No seizures or epileptiform discharges were seen throughout the recording.   Alrick Cubbage O Verity Gilcrest

## 2024-01-09 NOTE — Plan of Care (Signed)
  Problem: Fluid Volume: Goal: Ability to maintain a balanced intake and output will improve Outcome: Progressing   Problem: Nutritional: Goal: Maintenance of adequate nutrition will improve Outcome: Progressing   Problem: Skin Integrity: Goal: Risk for impaired skin integrity will decrease Outcome: Progressing   Problem: Tissue Perfusion: Goal: Adequacy of tissue perfusion will improve Outcome: Progressing   Problem: Cardiac: Goal: Ability to achieve and maintain adequate cardiovascular perfusion will improve Outcome: Progressing   Problem: Cardiac: Goal: Ability to achieve and maintain adequate cardiopulmonary perfusion will improve Outcome: Progressing

## 2024-01-10 DIAGNOSIS — I469 Cardiac arrest, cause unspecified: Secondary | ICD-10-CM | POA: Diagnosis not present

## 2024-01-10 DIAGNOSIS — G4089 Other seizures: Secondary | ICD-10-CM

## 2024-01-10 DIAGNOSIS — G931 Anoxic brain damage, not elsewhere classified: Secondary | ICD-10-CM | POA: Diagnosis not present

## 2024-01-10 DIAGNOSIS — I5041 Acute combined systolic (congestive) and diastolic (congestive) heart failure: Secondary | ICD-10-CM | POA: Diagnosis not present

## 2024-01-10 DIAGNOSIS — G9341 Metabolic encephalopathy: Secondary | ICD-10-CM | POA: Diagnosis not present

## 2024-01-10 DIAGNOSIS — I4891 Unspecified atrial fibrillation: Secondary | ICD-10-CM | POA: Diagnosis not present

## 2024-01-10 LAB — CBC
HCT: 36.4 % (ref 36.0–46.0)
Hemoglobin: 9.8 g/dL — ABNORMAL LOW (ref 12.0–15.0)
MCH: 20.9 pg — ABNORMAL LOW (ref 26.0–34.0)
MCHC: 26.9 g/dL — ABNORMAL LOW (ref 30.0–36.0)
MCV: 77.4 fL — ABNORMAL LOW (ref 80.0–100.0)
Platelets: 363 10*3/uL (ref 150–400)
RBC: 4.7 MIL/uL (ref 3.87–5.11)
RDW: 17.7 % — ABNORMAL HIGH (ref 11.5–15.5)
WBC: 22.1 10*3/uL — ABNORMAL HIGH (ref 4.0–10.5)
nRBC: 1.1 % — ABNORMAL HIGH (ref 0.0–0.2)

## 2024-01-10 LAB — BASIC METABOLIC PANEL WITH GFR
Anion gap: 12 (ref 5–15)
BUN: 52 mg/dL — ABNORMAL HIGH (ref 8–23)
CO2: 30 mmol/L (ref 22–32)
Calcium: 7.9 mg/dL — ABNORMAL LOW (ref 8.9–10.3)
Chloride: 96 mmol/L — ABNORMAL LOW (ref 98–111)
Creatinine, Ser: 1.2 mg/dL — ABNORMAL HIGH (ref 0.44–1.00)
GFR, Estimated: 51 mL/min — ABNORMAL LOW (ref >60)
Glucose, Bld: 150 mg/dL — ABNORMAL HIGH (ref 70–99)
Potassium: 4.3 mmol/L (ref 3.5–5.1)
Sodium: 138 mmol/L (ref 135–145)

## 2024-01-10 LAB — GLUCOSE, CAPILLARY
Glucose-Capillary: 152 mg/dL — ABNORMAL HIGH (ref 70–99)
Glucose-Capillary: 136 mg/dL — ABNORMAL HIGH (ref 70–99)
Glucose-Capillary: 164 mg/dL — ABNORMAL HIGH (ref 70–99)
Glucose-Capillary: 104 mg/dL — ABNORMAL HIGH (ref 70–99)
Glucose-Capillary: 120 mg/dL — ABNORMAL HIGH (ref 70–99)
Glucose-Capillary: 159 mg/dL — ABNORMAL HIGH (ref 70–99)

## 2024-01-10 LAB — MAGNESIUM: Magnesium: 2.4 mg/dL (ref 1.7–2.4)

## 2024-01-10 LAB — PHOSPHORUS: Phosphorus: 3.2 mg/dL (ref 2.5–4.6)

## 2024-01-10 LAB — HEPARIN LEVEL (UNFRACTIONATED): Heparin Unfractionated: 0.31 [IU]/mL (ref 0.30–0.70)

## 2024-01-10 MED ORDER — ACETAMINOPHEN 325 MG PO TABS
650.0000 mg | ORAL_TABLET | Freq: Four times a day (QID) | ORAL | Status: DC | PRN
  Administered 2024-01-10 (×2): 650 mg
  Filled 2024-01-10 (×3): qty 2

## 2024-01-10 MED ORDER — LEVETIRACETAM 100 MG/ML PO SOLN
1000.0000 mg | Freq: Two times a day (BID) | ORAL | Status: DC
Start: 2024-01-10 — End: 2024-01-13
  Administered 2024-01-10 – 2024-01-11 (×3): 1000 mg
  Filled 2024-01-10 (×5): qty 10

## 2024-01-10 MED ORDER — IBUPROFEN 400 MG PO TABS
600.0000 mg | ORAL_TABLET | Freq: Once | ORAL | Status: AC
  Administered 2024-01-10: 600 mg
  Filled 2024-01-10: qty 2

## 2024-01-10 NOTE — Progress Notes (Signed)
 PT PLACED ON CPAP FOR TRIAL-TV 303, RR 24 AND SATS DROPPED FROM 96% TO 88%. PT SWITCHED BACK TO PREVIOUS SETTINGS AT THIS TIME

## 2024-01-10 NOTE — Progress Notes (Signed)
 [facility] CLINIC CARDIOLOGY PROGRESS NOTE   Patient ID: [name] MRN: [id number] DOB/AGE: [birth year] 63 y.o.  Admit date: 01/05/2024 Referring Physician Dr. Para March Primary Physician Sandrea Hughs, NP  Primary Cardiologist None Reason for Consultation AF RVR  HPI: [name] is a 63 y.o. female with a past medical history of coronary artery disease s/p PCI, OSA on CPAP, COPD, PAD s/p right BKA, left carotid endarterectomy , prior CVA (2021) who presented to the ED on 01/05/2024 for shortness of breath and fatigue. Patient was found to have new onset atrial fibrillation with RVR. Cardiology was consulted.  Interval History:  -Patient is intubated and sedated s/p cardiac arrest (04/13) with ROSC in 13 minutes.  -HR improved this AM, in atrial fibrillation on tele. -Cr stable at 1.20 today. Hgb stable today.  -IV Neo gtt stopped at 1023 on 04/15. BP elevated this AM.  Review of systems complete and found to be negative unless listed above    Vitals:   01/10/24 0818 01/10/24 0830 01/10/24 0900 01/10/24 0930  BP:  132/83 (!) 148/76 (!) 153/95  Pulse:      Resp:  (!) 22 (!) 24 (!) 23  Temp:  (!) 100.9 F (38.3 C) (!) 101.1 F (38.4 C) (!) 101.1 F (38.4 C)  TempSrc:      SpO2: 100%     Weight:      Height:         Intake/Output Summary (Last 24 hours) at 01/10/2024 0947 Last data filed at 01/10/2024 0800 Gross per 24 hour  Intake 1785.72 ml  Output 1660 ml  Net 125.72 ml     PHYSICAL EXAM General: Chronically ill appearing female, well nourished, in no acute distress. HEENT: Normocephalic and atraumatic. Neck: No JVD.  Lungs: Intubated. Mechanical breath sounds. Heart: HRRR. Normal S1 and S2 without gallops or murmurs. Radial & DP pulses 2+ bilaterally. Abdomen: Non-distended appearing.  Msk: Normal strength and tone for age. Extremities: No clubbing, cyanosis or edema. Right BKA.   LABS: Basic Metabolic Panel: Recent Labs    01/09/24 0539 01/10/24 0447  NA  140 138  K 3.9 4.3  CL 98 96*  CO2 31 30  GLUCOSE 253* 150*  BUN 42* 52*  CREATININE 1.17* 1.20*  CALCIUM 7.7* 7.9*  MG 2.2 2.4  PHOS 2.7 3.2   Liver Function Tests: Recent Labs    01/09/24 0539  AST 75*  ALT 150*  ALKPHOS 94  BILITOT 1.0  PROT 5.6*  ALBUMIN 2.3*   No results for input(s): "LIPASE", "AMYLASE" in the last 72 hours.  CBC: Recent Labs    01/09/24 0539 01/10/24 0447  WBC 21.2* 22.1*  HGB 9.4* 9.8*  HCT 35.1* 36.4  MCV 78.0* 77.4*  PLT 366 363   Cardiac Enzymes: No results for input(s): "CKTOTAL", "CKMB", "CKMBINDEX", "TROPONINIHS" in the last 72 hours.  BNP: No results for input(s): "BNP" in the last 72 hours.  D-Dimer: No results for input(s): "DDIMER" in the last 72 hours. Hemoglobin A1C: No results for input(s): "HGBA1C" in the last 72 hours.  Fasting Lipid Panel: Recent Labs    01/08/24 0412  TRIG 154*   Thyroid Function Tests: No results for input(s): "TSH", "T4TOTAL", "T3FREE", "THYROIDAB" in the last 72 hours.  Invalid input(s): "FREET3"  Anemia Panel: No results for input(s): "VITAMINB12", "FOLATE", "FERRITIN", "TIBC", "IRON", "RETICCTPCT" in the last 72 hours.  MR BRAIN WO CONTRAST Result Date: 01/09/2024 CLINICAL DATA:  Mental status change, unknown cause; cardiac arrest April 12  EXAM: MRI HEAD WITHOUT CONTRAST TECHNIQUE: Multiplanar, multiecho pulse sequences of the brain and surrounding structures were obtained without intravenous contrast. COMPARISON:  November 2023 FINDINGS: Brain: There is relatively diffuse cortical mildly reduced diffusion. Basal ganglia also demonstrate increased diffusion and T2 signal bilaterally. There is somewhat patchy cerebellar mildly reduced diffusion. Prominence of the ventricles and sulci reflecting stable parenchymal volume loss. Other confluent areas of T2 hyperintensity in the supratentorial and pontine white matter probably reflects stable advanced chronic microvascular ischemic changes. Chronic  small vessel infarcts of the central white matter bilaterally and left cerebellum. Vascular: Major vessel flow voids at the skull base are preserved. Skull and upper cervical spine: Normal marrow signal is preserved. Sinuses/Orbits: Paranasal sinuses are aerated. Orbits are unremarkable. Other: Sella is unremarkable. Patchy bilateral mastoid fluid opacification. IMPRESSION: Cerebral and cerebellar abnormal signal is described likely reflecting sequelae of hypoxic/ischemic injury in the setting of reported cardiac arrest. Electronically Signed   By: Guadlupe Spanish M.D.   On: 01/09/2024 19:58   EEG adult Result Date: 01/09/2024 Charlsie Quest, MD     01/09/2024  2:11 PM Patient Name: [name] MRN: [id number] Epilepsy Attending: Charlsie Quest Referring Physician/Provider: Milon Dikes, MD Date: 01/09/2024 Duration: 31.53 mins  Patient history:  63 yo F s/p cardiac arrest developed persistent eyelid myoclonus which has now resolved. EEG to evaluate for seizure  Level of alertness:  comatose  AEDs during EEG study: LEV, propofol  Technical aspects: This EEG study was done with scalp electrodes positioned according to the 10-20 International system of electrode placement. Electrical activity was reviewed with band pass filter of 1-70Hz , sensitivity of 7 uV/mm, display speed of 11mm/sec with a 60Hz  notched filter applied as appropriate. EEG data were recorded continuously and digitally stored.  Video monitoring was available and reviewed as appropriate.  Description: EEG showed continuous generalized background suppression. Hyperventilation and photic stimulation were not performed.    ABNORMALITY - Background suppression, generalized  IMPRESSION: This study is suggestive of profound diffuse encephalopathy. No seizures or epileptiform discharges were seen throughout the recording.  Charlsie Quest   EEG adult Result Date: 01/08/2024 Charlsie Quest, MD     01/08/2024  1:05 PM Patient Name: [name]  MRN: [id number] Epilepsy Attending: Charlsie Quest Referring Physician/Provider: Ezequiel Essex, NP Date: 01/08/2024 Duration: 29.28 mins Patient history:  63 yo F s/p cardiac arrest developed persistent eyelid myoclonus which has now resolved. EEG to evaluate for seizure Level of alertness:  comatose AEDs during EEG study: LEV, propofol Technical aspects: This EEG study was done with scalp electrodes positioned according to the 10-20 International system of electrode placement. Electrical activity was reviewed with band pass filter of 1-70Hz , sensitivity of 7 uV/mm, display speed of 24mm/sec with a 60Hz  notched filter applied as appropriate. EEG data were recorded continuously and digitally stored.  Video monitoring was available and reviewed as appropriate. Description: EEG showed continuous generalized and maximal bifrontal polymorphic sharply contoured 3 to 6 Hz theta-delta slowing. Hyperventilation and photic stimulation were not performed.   ABNORMALITY - Continuous slow, generalized and maximal bifrontal IMPRESSION: This study is suggestive of cortical dysfunction arising from bifrontal region likely secondary to underlying structural abnormality. Additionally there is moderate to severe diffuse encephalopathy. No seizures or epileptiform discharges were seen throughout the recording. [name]     ECHO as above  TELEMETRY reviewed by me 01/10/24: atrial fibrillation rate 100s  EKG reviewed by me 01/10/24: atrial fibrillations with RBBB, rate  139 bpm  DATA reviewed by me 01/10/24: last 24h vitals tele labs imaging I/O, hospitalist, critical care, neurology progress notes  Principal Problem:   Atrial fibrillation with rapid ventricular response (HCC) Active Problems:   Essential hypertension, benign   Obesity, Class III, BMI 40-49.9 (morbid obesity) (HCC)   PAD (peripheral artery disease) (HCC)   Uncontrolled type 2 diabetes mellitus with hypoglycemia, with long-term current use of  insulin (HCC)   NSTEMI (non-ST elevated myocardial infarction) (HCC)   CAD S/P percutaneous coronary angioplasty   OSA on CPAP   Hx of right BKA (HCC)   History of left-sided carotid endarterectomy   Acute CHF (congestive heart failure) (HCC)   Acute hypercapnic respiratory failure (HCC)   Cardiac arrest (HCC)    ASSESSMENT AND PLAN:  AVIYANA SONNTAG is a 63 y.o. female with a past medical history of coronary artery disease s/p PCI, OSA on CPAP, COPD, PAD s/p right BKA, left carotid endarterectomy , prior CVA (2021) who presented to the ED on 01/05/2024 for shortness of breath and fatigue. Patient was found to have new onset atrial fibrillation with RVR. Cardiology was consulted.  # Cardiac arrest # Acute respiratory failure  # New onset atrial fibrillation RVR  # Acute HFrEF with grade II diastolic dysfunction # NSTEMI, Hx Coronary artery disease s/p PCI # Hx of CVA (2021) Patient presented to the ED on 04/11 with shortness of breath, fatigue, palpitations and EKG revealed new onset atrial fibrillation with RVR. CT revealed right pleural effusion, pulmonary edema and no evidence of pulmonary embolism. CXR showed cardiomegaly. Trops elevated and trending 1,367 > 1,686 > 1,827 > 1,654 > 1,467 > 847. BNP elevated 1,263 > 750. Echo this admission shows EF 35-40%, global hypokinesis, borderline LVH, grade II diastolic dysfunction. IV metoprolol x2 given with no significant improvement in HR. IV dilt infusion started for tachycardia in 130s then stopped due to reduced EF. IV amio infusion started for atrial fibrillation on 04/13 and about 20 minutes later patient became bradycardia then pulseless asystole Code blue was called for Cardiac arrest with ROSC obtained 13 minutes. Patient remains intubated and sedated. IV Neo gtt stopped at 1023 on 04/15. BP is elevated this AM. S/p IV metoprolol tartrate HR improved. HR 100s on tele in atrial fibrillation this AM. -Continue metoprolol tartrate 12.5 mg twice  daily per tube. -Continue heparin gtt given atrial fibrillation.  -Continue ASA 81 mg, atorvastatin 40 mg per tube. -Continue IV lasix 40 mg BID. Closely monitor renal function and UOP. -Defer GDMT until patient is extubated and able to take pills, renal function stabilizes. -No plan for further cardiac diagnostics at this time as patient is intubated. -PCCM has consulted neurology who are assessing neurologic function.  Patient is critically ill with multiorgan failure and multiple comorbidities. Prognosis is guarded.   This patient's case was discussed and created with Dr. Custovic and she is in agreement.  Signed:  Hamp Levine, PA-C  01/10/2024, 9:47 AM [facility]

## 2024-01-10 NOTE — Plan of Care (Signed)
 Palliative:  No family at bedside when I first visit. Notified that PCCM NP has spoken with family decision for DNR and extubation to comfort care on Friday. Discussed with PCCM NP Bernabe Brew and no palliative needs at this time. Will be available as needed.   No charge  Vila Grayer, NP Palliative Medicine Team Pager [phone] (Please see [email or web address] for schedule) Team Phone [phone]

## 2024-01-10 NOTE — Progress Notes (Signed)
 NAME:  [name], MRN:  [id number], DOB:  Dec 17, 1960, LOS: 5 ADMISSION DATE:  01/05/2024, CONSULTATION DATE: 01/06/2024 REFERRING MD: Dr. Joylene Igo, CHIEF COMPLAINT: Cardiac Arrest    Brief Pt Description / Synopsis:  63 y.o. female admitted with new onset A.fib with RVR, NSTEMI, and decompensated HFrEF.  On 01/06/24, Hospital course complicated by bradycardia with progression to Asystole Cardiac Arrest (ROSC after 5 minutes).  Post arrest with development of eyelid myoclonus and concern for anoxic brain injury.   History of Present Illness:  This is a 63 yo female who presented to [facility] ER on 04/11 via EMS from home with c/o weakness and shortness of breath onset 3 days prior to presentation.   At baseline she wears CPAP at night, but is not on chronic O2.  Per EMS pt found to be in atrial fibrillation with rvr.  She does not have a hx of atrial fibrillation.  She was also hypoglycemic CBG 46, EMS administered glucagon.  However, she remained hypoglycemic and received 1 amp of D50.  CBG stabilized.    ED Course  Upon arrival to the ER initial EKG revealed atrial fibrillation with right bundle branch block, hr 139 but no acute ST elevation or depression present.  She received 1L iv fluid bolus.  Significant lab results were: glucose 58/BUN 27/creatinine 1.21/albumin 3.1/BNP 1,263.5/troponin 1,367/wbc 11.9/hgb 9.7/platelet count 450.  CTA Chest negative for PE, but concerning for interstitial edema.  She received 5 mg of iv metoprolol x2 with no significant improvement in heart rate.  Heparin bolus followed by heparin gtt administered for possible NSTEMI.  She was subsequently admitted to the progressive care unit per hospitalist team for additional workup and treatment, however she remained in the ER pending bed availability.  See detailed hospital course below under significant events.   CTA Chest PE:  No evidence acute pulmonary embolism. RIGHT pleural effusion with RIGHT basilar atelectasis. Mild  interstitial thickening at the lung bases suggest interstitial edema. Ill-defined ground-glass densities in the upper lobes. Findings suggest mild pulmonary edema. Benign LEFT adrenal adenoma.  CXR: Detail limited exam without definite plain film evidence of acute process. Enlarged heart.  Pertinent  Medical History  Anemia  Anxiety  Arthritis  COPD CAD Type II Diabetes Mellitus  Fibromyalgia  GERD  HTN  PVD OSA Stroke  CHF   Micro Data:  4/11: SARS-CoV-2/flu/RSV PCR>>negative 04/11: HIV screen>>non reactive 4/12: MRSA PCR>>negative 4/12: Blood culture x 2>> no growth to date  Antimicrobials:   Anti-infectives (From admission, onward)    Start     Dose/Rate Route Frequency Ordered Stop   01/10/24 1000  cefTRIAXone (ROCEPHIN) 2 g in sodium chloride 0.9 % 100 mL IVPB        2 g 200 mL/hr over 30 Minutes Intravenous Every 24 hours 01/09/24 1028 01/01/2024 0959   01/07/24 1100  piperacillin-tazobactam (ZOSYN) IVPB 3.375 g        3.375 g 12.5 mL/hr over 240 Minutes Intravenous Every 8 hours 01/07/24 1001 01/09/24 2359      Significant Hospital Events: Including procedures, antibiotic start and stop dates in addition to other pertinent events   04/11: Pt admitted to the progressive care unit with new onset atrial fibrillation with rvr, acute respiratory failure secondary to acute CHF exacerbation, and NSTEMI.   04/12: Pt remained in atrial fibrillation with rvr amiodarone bolus administered followed by amiodarone gtt at 60 mg/hr.  Following administration she became bradycardic and pulseless ACLS protocol initiated with ROSC 13 minutes following  initiation of ACLS protocol.  Pt mechanically intubated during cardiac arrest.  Post cardiac arrest pt comatose, unable to follow commands, with no gag/oculocephalic reflexes present despite NOT receiving RSI medications and intermittent eyelid myoclonus present neurology consulted and cerebell initiated 04/13: Pt with worsening myoclonus,  cerebell remains in place and noted to have myoclonic seizures every 1-2 minutes suggestive of anoxic/hypoxic brain injury.  Propofol gtt ordered for suppression.  04/14: Plan for repeat EEG today.  On Neuro exam, currently off propofol, no myoclonus noted.  Does have + brainstem reflexes, somewhat opening eyes to noxious stimulation.  Allowing time for neuro prognostication, plan for MRI Brain tomorrow. 04/15: Overnight/this morning developed A.flutter w/ RVR.  Afebrile, requiring low dose Neosynephrine. Currently sedated on Propofol, will wean for Neuro exam. Repeating EEG, and plan for MRI Brain today. 04/16: Pt remains mechanically intubated on minimal settings, however neuro exam remains the same and MRI Brain suggestive of diffuse hypoxic/anoxic brain injury   Interim History / Subjective:  As outlined above under significant events   Objective   Blood pressure 125/69, pulse (!) 151, temperature 98.1 F (36.7 C), temperature source Oral, resp. rate 20, height 5' 5.51" (1.664 m), weight 126.4 kg, SpO2 100%.    Vent Mode: PRVC FiO2 (%):  [28 %] 28 % Set Rate:  [20 bmp] 20 bmp Vt Set:  [450 mL] 450 mL PEEP:  [5 cmH20] 5 cmH20 Plateau Pressure:  [21 cmH20] 21 cmH20   Intake/Output Summary (Last 24 hours) at 01/10/2024 1610 Last data filed at 01/10/2024 0600 Gross per 24 hour  Intake 2130.31 ml  Output 1710 ml  Net 420.31 ml   Filed Weights   01/08/24 0508 01/09/24 0500 01/10/24 0500  Weight: 127.2 kg 128.8 kg 126.4 kg   Examination: General: Acutely-ill appearing obese female, NAD mechanically intubated  HENT: Supple, unable to assess for JVD due to body habitus, orally intubated Lungs: Faint rhonchi throughout, even, non labored, overbreathing the vent Cardiovascular: NSR, s1s2, no m/r/g, 2+ radial/1+ left distal pulse, 1+ right femoral pulse, no edema  Abdomen: +BS x4, obese, soft, non distended  Extremities: Right BKA  Neuro: On low dose propofol, not following commands or  withdraws from stimulation, + cough/gag/corneal reflexes, bilateral pupils very sluggish, no myoclonus GU: Indwelling foley catheter present draining yellow urine   Resolved Hospital Problem list     Assessment & Plan:   #Acute metabolic encephalopathy  #Myoclonic seizures post cardiac arrest~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 Severe chronic small vessel ischemic disease EEG 4/14 negative for myoclonus  MRI Brain 04/15: Cerebral and cerebellar abnormal signal is described likely reflecting sequelae of hypoxic/ischemic injury in the setting of reported cardiac arrest - Treatment of metabolic derangements as outlined below - Maintain RASS goal of 0  - Propofol as needed for suppression of myoclonus per neuro recommendations  - Avoid sedating medications as able  - Continue scheduled keppra 1g q12rs for myoclonus  - Neurology following, appreciate input - Seizure precautions  - Normothermia protocol completed  - WUA daily   #Cardiac arrest, suspect secondary to amiodarone (asystole) #NSTEMI  #New-onset atrial fibrillation with rvr / A-flutter w/ RVR #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  Hx: HTN, PVD, stroke, and CAD  Echo 01/06/2024: EF 35 to 40%; grade II diastolic dysfunction  - Continuous telemetry monitoring  - Continue scheduled metoprolol for rate control  - Continue heparin gtt: dosing per pharmacy  - Diurese as  bp and renal function permits  - Hold antihypertensives for now  - Maintain map 65 or higher  - Continue aspirin and atorvastatin  - Cardiology following, appreciate input  - Troponin peaked at 1,827 - TSH normal: 2.6  #Acute hypoxic respiratory failure  #Mechanical intubation  Hx: COPD and OSA  - Full vent support, implement lung protective strategies - Plateau pressures less than 30 cm H20 - Wean FiO2 & PEEP as tolerated to maintain O2 sats 88 to 92% - Follow  intermittent Chest X-ray & ABG as needed -Spontaneous Breathing Trials when respiratory parameters met and mental status permits ~ MENTAL STATUS CURRENTLY PRECLUDING EXTUBATION - Implement VAP Bundle - Prn Bronchodilators - Diuresis as BP and renal function permits ~ currently on 20 mg IV Lasix BID - ABX as above  #Acute kidney injury secondary to ATN ~ improving #Severe metabolic acidosis ~ resolved #Lactic acidosis  #Mild Hypokalemia - Monitor I&O's / urinary output - Follow BMP - Ensure adequate renal perfusion - Avoid nephrotoxic agents as able - Replace electrolytes as indicated ~ Pharmacy following for assistance with electrolyte replacement  #Transamanitis  - Trend hepatic function panel  - Avoid hepatotoxic agents as able   #Anemia without obvious signs of bleeding  - Trend CBC  - Monitor for s/sx of bleeding  - Transfuse for hgb <7   #Type II diabetes mellitus  #Hypoglycemia~resolved  - CBG's q4hrs  - SSI  - Follow hypo/hyperglycemic protocol  - Target CBG range 140 to 180  Pt with worsening myoclonus cerebell showing myoclonic seizures every 1-2 minutes suggestive of anoxic/hypoxic brain injury.  Pt with poor prognosis, recommend changing code status to DO NOT RESUSCITATE.  MRI Brain suggestive of anoxic brain injury.  Palliative Care consulted to assist with goals of care conversations  Best Practice (right click and "Reselect all SmartList Selections" daily)   Diet/type: NPO; TF's DVT prophylaxis systemic heparin Pressure ulcer(s): N/A GI prophylaxis: PPI Lines: N/A Foley:  Yes, and it is still needed Code Status:  full code Last date of multidisciplinary goals of care discussion [01/10/2024]  04/16: Will update pts family when they arrive at bedside  Labs   CBC: Recent Labs  Lab 01/05/24 1547 01/06/24 0431 01/07/24 0258 01/07/24 0516 01/08/24 0412 01/09/24 0539 01/10/24 0447  WBC 11.9*   < > 25.2* 25.5* 26.2* 21.2* 22.1*  NEUTROABS 7.9*  --   --    --   --   --   --   HGB 9.7*   < > 10.0* 9.9* 9.3* 9.4* 9.8*  HCT 35.5*   < > 35.8* 36.0 33.7* 35.1* 36.4  MCV 77.9*   < > 75.8* 75.2* 74.9* 78.0* 77.4*  PLT 450*   < > 485* 442* 317 366 363   < > = values in this interval not displayed.    Basic Metabolic Panel: Recent Labs  Lab 01/07/24 0258 01/07/24 0516 01/08/24 0412 01/09/24 0539 01/10/24 0447  NA 136 136 140 140 138  K 4.6 4.5 3.4* 3.9 4.3  CL 100 101 98 98 96*  CO2 22 23 31 31 30   GLUCOSE 197* 198* 229* 253* 150*  BUN 44* 43* 45* 42* 52*  CREATININE 1.93* 2.05* 1.58* 1.17* 1.20*  CALCIUM 8.3* 8.4* 7.9* 7.7* 7.9*  MG 2.7* 2.3 2.3 2.2 2.4  PHOS 6.0* 5.7* 3.7 2.7 3.2   GFR: Estimated Creatinine Clearance: 64.8 mL/min (A) (by C-G formula based on SCr of 1.2 mg/dL (H)). Recent Labs  Lab 01/06/24 1658 01/07/24  1610 01/07/24 0516 01/08/24 0412 01/09/24 0539 01/10/24 0447  WBC  --    < > 25.5* 26.2* 21.2* 22.1*  LATICACIDVEN 4.9*  --  2.7*  --   --   --    < > = values in this interval not displayed.    Liver Function Tests: Recent Labs  Lab 01/05/24 1547 01/06/24 1549 01/07/24 0258 01/07/24 0516 01/09/24 0539  AST 24 101*  --  90* 75*  ALT 26 84*  --  110* 150*  ALKPHOS 95 99  --  101 94  BILITOT 0.6 1.0  --  0.9 1.0  PROT 7.2 6.2*  --  6.4* 5.6*  ALBUMIN 3.1* 2.7* 2.9* 2.8* 2.3*   Recent Labs  Lab 01/05/24 1547  LIPASE 21   No results for input(s): "AMMONIA" in the last 168 hours.  ABG    Component Value Date/Time   PHART 7.04 (LL) 01/06/2024 1354   PCO2ART 62 (H) 01/06/2024 1354   PO2ART 116 (H) 01/06/2024 1354   HCO3 24.4 01/06/2024 1925   ACIDBASEDEF 3.8 (H) 01/06/2024 1925   O2SAT 76 01/06/2024 1925     Coagulation Profile: Recent Labs  Lab 01/05/24 1839  INR 1.4*    Cardiac Enzymes: No results for input(s): "CKTOTAL", "CKMB", "CKMBINDEX", "TROPONINI" in the last 168 hours.  HbA1C: Hgb A1c MFr Bld  Date/Time Value Ref Range Status  01/05/2024 10:56 PM 7.2 (H) 4.8 - 5.6 %  Final    Comment:    (NOTE)         Prediabetes: 5.7 - 6.4         Diabetes: >6.4         Glycemic control for adults with diabetes: <7.0   09/23/2020 07:10 AM 8.6 (H) 4.8 - 5.6 % Final    Comment:    (NOTE) Pre diabetes:          5.7%-6.4%  Diabetes:              >6.4%  Glycemic control for   <7.0% adults with diabetes     CBG: Recent Labs  Lab 01/09/24 1115 01/09/24 1547 01/09/24 2026 01/09/24 2340 01/10/24 0403  GLUCAP 242* 221* 147* 150* 152*    Review of Systems:   Unable to assess pt mechanically intubated/sedation/critically ill  Past Medical History:  She,  has a past medical history of Anemia, Anxiety, Arthritis, CHF (congestive heart failure) (HCC), COPD (chronic obstructive pulmonary disease) (HCC), Coronary artery disease, Diabetes mellitus without complication (HCC), Fibromyalgia, GERD (gastroesophageal reflux disease), Hypertension, Peripheral vascular disease (HCC), Sleep apnea, and Stroke (HCC).   Surgical History:   Past Surgical History:  Procedure Laterality Date   CESAREAN SECTION     COLONOSCOPY WITH PROPOFOL N/A 08/02/2022   Procedure: COLONOSCOPY WITH PROPOFOL;  Surgeon: Midge Minium, MD;  Location: [facility];  Service: Endoscopy;  Laterality: N/A;   CORONARY ANGIOPLASTY WITH STENT PLACEMENT Left    DILATION AND CURETTAGE OF UTERUS     ENDARTERECTOMY Left 01/29/2015   Procedure: ENDARTERECTOMY CAROTID;  Surgeon: Annice Needy, MD;  Location: [facility];  Service: Vascular;  Laterality: Left;   LOWER EXTREMITY ANGIOGRAPHY Left 09/24/2020   Procedure: Lower Extremity Angiography;  Surgeon: Annice Needy, MD;  Location: [facility];  Service: Cardiovascular;  Laterality: Left;   PERIPHERAL VASCULAR CATHETERIZATION Right 05/27/2015   Procedure: Lower Extremity Angiography;  Surgeon: Annice Needy, MD;  Location: [facility];  Service: Cardiovascular;  Laterality: Right;   PERIPHERAL VASCULAR  CATHETERIZATION  05/27/2015   Procedure:  Lower Extremity Intervention;  Surgeon: Celso College, MD;  Location: [facility];  Service: Cardiovascular;;   PERIPHERAL VASCULAR CATHETERIZATION Right 09/22/2016   Procedure: Lower Extremity Angiography;  Surgeon: Celso College, MD;  Location: [facility];  Service: Cardiovascular;  Laterality: Right;   TUBAL LIGATION       Social History:   reports that she quit smoking about 7 years ago. Her smoking use included cigarettes. She started smoking about 45 years ago. She has a 9.5 pack-year smoking history. She uses smokeless tobacco. She reports that she does not drink alcohol and does not use drugs.   Family History:  Her family history is not on file.   Allergies Allergies  Allergen Reactions   Niacin And Related Nausea And Vomiting     Home Medications  Prior to Admission medications   Medication Sig Start Date End Date Taking? Authorizing Provider  atorvastatin (LIPITOR) 40 MG tablet Take 40 mg by mouth every evening.   Yes [provider]  buPROPion (WELLBUTRIN XL) 300 MG 24 hr tablet Take 300 mg by mouth daily. Pt taking 300 mg by mouth daily   Yes [provider]  cetirizine (ZYRTEC) 10 MG tablet Take 10 mg by mouth daily.   Yes [provider]  DULoxetine (CYMBALTA) 60 MG capsule Take 60 mg by mouth daily.   Yes [provider]  enalapril (VASOTEC) 5 MG tablet Take 5 mg by mouth daily.   Yes [provider]  HUMULIN R U-500 KWIKPEN 500 UNIT/ML kwikpen Inject 40-80 Units into the skin See admin instructions. Inject 80u under the skin every morning before breakfast and inject 40u under the skin every evening before supper   Yes [provider]  metFORMIN (GLUCOPHAGE-XR) 500 MG 24 hr tablet Take 500 mg by mouth 2 (two) times daily with a meal.   Yes [provider]  OLANZapine (ZYPREXA) 5 MG tablet Take 5 mg by mouth daily. 02/11/21  Yes [provider]  oxybutynin (DITROPAN-XL) 10 MG 24 hr tablet  Take 10 mg by mouth daily.   Yes [provider]  pantoprazole (PROTONIX) 40 MG tablet Take 40 mg by mouth daily.   Yes [provider]  traZODone (DESYREL) 50 MG tablet Take 100 mg by mouth at bedtime.   Yes [provider]  collagenase (SANTYL) ointment Apply topically daily. Patient not taking: Reported on 01/05/2024 09/26/20   Alphonsus Jeans, MD  Dulaglutide (TRULICITY) 0.75 MG/0.5ML SOPN Inject 0.75 mg into the skin every Tuesday. Patient not taking: Reported on 06/24/2022    [provider]  gabapentin (NEURONTIN) 300 MG capsule Take 300-600 mg by mouth at bedtime. Patient not taking: Reported on 01/05/2024    [provider]     Critical care time: 40 minutes     Janey Meek, AGNP  Pulmonary/Critical Care Pager [phone] (please enter 7 digits) PCCM Consult Pager [phone] (please enter 7 digits)

## 2024-01-10 NOTE — Progress Notes (Signed)
 PHARMACY CONSULT NOTE  Pharmacy Consult for Electrolyte Monitoring and Replacement   Recent Labs: Potassium (mmol/L)  Date Value  01/10/2024 4.3  01/22/2015 4.2   Magnesium (mg/dL)  Date Value  16/06/9603 2.4  08/09/2014 1.2 (L)   Calcium (mg/dL)  Date Value  54/05/8118 7.9 (L)   Calcium, Total (mg/dL)  Date Value  14/78/2956 9.2   Albumin (g/dL)  Date Value  21/30/8657 2.3 (L)  08/08/2014 3.1 (L)   Phosphorus (mg/dL)  Date Value  84/69/6295 3.2   Sodium (mmol/L)  Date Value  01/10/2024 138  01/22/2015 137   Assessment: 63 yo female who presented to [facility] ER on 04/11 via EMS from home with c/o weakness and shortness of breath onset 3 days prior to presentation. Pharmacy is asked to follow and replace electrolytes while in CCU  Diuretics: Lasix 20 mg IV q12h  Goal of Therapy:  Potassium 4.0 - 5.1 mmol/L Magnesium 20 - 2.4 mg/dL All Other Electrolytes WNL  Plan:  --No electrolyte replacement indicated at this time --Re-check electrolytes in AM  Page Boast 01/10/2024 7:51 AM

## 2024-01-10 NOTE — Progress Notes (Signed)
   01/10/24 0930  Spiritual Encounters  Type of Visit Follow up  Care provided to: Family (Daughter crying in ICU hallway)  Referral source Chaplain assessment  Reason for visit Urgent spiritual support  OnCall Visit No  Spiritual Framework  Presenting Themes Goals in life/care;Impactful experiences and emotions;Other (comment) (Daughter knows decisions will have to be made soon on the behalf of her mother.)  Family Stress Factors Exhausted;Loss;Major life changes;Other (Comment) (for Daughter)  Interventions  Spiritual Care Interventions Made Compassionate presence;Reflective listening;Decision-making support/facilitation;Encouragement  Intervention Outcomes  Outcomes Awareness around self/spiritual resourses;Connection to values and goals of care;Awareness of support;Other (comment) (for Daughter)

## 2024-01-10 NOTE — Consult Note (Signed)
 PHARMACY - ANTICOAGULATION CONSULT NOTE  Pharmacy Consult for IV Heparin Indication: chest pain/ACS  Patient Measurements: Height: 5' 5.51" (166.4 cm) Weight: 128.8 kg (283 lb 15.2 oz) IBW/kg (Calculated) : 58.18 HEPARIN DW (KG): 88  Labs: Recent Labs    01/08/24 0412 01/09/24 0539 01/10/24 0447  HGB 9.3* 9.4* 9.8*  HCT 33.7* 35.1* 36.4  PLT 317 366 363  HEPARINUNFRC 0.30 0.41 0.31  CREATININE 1.58* 1.17* 1.20*    Estimated Creatinine Clearance: 65.5 mL/min (A) (by C-G formula based on SCr of 1.2 mg/dL (H)).   Medical History: Past Medical History:  Diagnosis Date   Anemia    Anxiety    Arthritis    CHF (congestive heart failure) (HCC)    COPD (chronic obstructive pulmonary disease) (HCC)    Coronary artery disease    Diabetes mellitus without complication (HCC)    Fibromyalgia    GERD (gastroesophageal reflux disease)    Hypertension    Peripheral vascular disease (HCC)    Sleep apnea    Stroke (HCC)     Medications:  No anticoagulation prior to admission per my chart review  Assessment: 63 y/o F with medical history as above presenting to the ED 4/11 with weakness, SOB. Troponin elevated. Pharmacy consulted to manage heparin infusion for ACS.  4/11:  HL @ 2256 = < 0.1, SUBtherapeutic 4/12 0854 HL 0.15   SUBtherapeutic  1400 >> 1650 4/12 2327 HL < 0.1  SUBtherapeutic  1650 units/hr  4/13 0516 HL 0.41   Therapeutic X 1  4/13 1106 HL 0.51   Therapeutic X 2  4/14 0412 HL 0.30   Therapeutic X 3 4/15 0539 HL 0.41   Therapeutic x 4 4/16 0447 HL 0.31    Therapeutic x 5  Goal of Therapy:  Heparin level 0.3-0.7 units/ml Monitor platelets by anticoagulation protocol: Yes   Plan: heparin level therapeutic X 5 --continue heparin infusion at 1650 units/hr --recheck next heparin level in am 04/17 - Daily CBC per protocol while on IV heparin  Coretta Dexter, PharmD, [facility] 01/10/2024 6:04 AM

## 2024-01-10 NOTE — Progress Notes (Signed)
 SLP Cancellation Note  Patient Details Name: [name] MRN: [id number] DOB: [birth year]   Cancelled treatment:       Reason Eval/Treat Not Completed: Medical issues which prohibited therapy (Per chart review, pt "unresponsive" and intubated with family considering compassionate extubation. Will defer cognitive-linguistic evaluation at this time.)  Dia Forget, M.S., CCC-SLP Speech-Language Pathologist [facility] [phone] [place])   Adin Honour 01/10/2024, 12:40 PM

## 2024-01-10 NOTE — Plan of Care (Signed)
  Problem: Education: Goal: Ability to describe self-care measures that may prevent or decrease complications (Diabetes Survival Skills Education) will improve Outcome: Progressing   Problem: Metabolic: Goal: Ability to maintain appropriate glucose levels will improve Outcome: Progressing   Problem: Nutritional: Goal: Maintenance of adequate nutrition will improve Outcome: Progressing Goal: Progress toward achieving an optimal weight will improve Outcome: Progressing   Problem: Skin Integrity: Goal: Risk for impaired skin integrity will decrease Outcome: Progressing   Problem: Tissue Perfusion: Goal: Adequacy of tissue perfusion will improve Outcome: Progressing   Problem: Cardiac: Goal: Ability to achieve and maintain adequate cardiovascular perfusion will improve Outcome: Progressing   Problem: Cardiac: Goal: Ability to achieve and maintain adequate cardiopulmonary perfusion will improve Outcome: Progressing   Problem: Clinical Measurements: Goal: Will remain free from infection Outcome: Progressing Goal: Diagnostic test results will improve Outcome: Progressing Goal: Cardiovascular complication will be avoided Outcome: Progressing   Problem: Nutrition: Goal: Adequate nutrition will be maintained Outcome: Progressing   Problem: Safety: Goal: Ability to remain free from injury will improve Outcome: Progressing   Problem: Skin Integrity: Goal: Risk for impaired skin integrity will decrease Outcome: Progressing

## 2024-01-10 NOTE — Progress Notes (Signed)
   01/10/24 1400  Spiritual Encounters  Type of Visit Follow up  Care provided to: Family (Family in waiting room)  Conversation partners present during encounter Nurse  Referral source Chaplain assessment  Reason for visit Routine spiritual support  OnCall Visit No  Spiritual Framework  Presenting Themes Goals in life/care;Impactful experiences and emotions;Other (comment) (Family decisions about patients goals of care)  Interventions  Spiritual Care Interventions Made Established relationship of care and support;Compassionate presence;Reflective listening;Decision-making support/facilitation;Supported grief process;Other (comment) (for Family)  Intervention Outcomes  Outcomes Connection to spiritual care;Connection to values and goals of care;Awareness of health;Awareness of support;Other (comment) (for Family)

## 2024-01-10 NOTE — Progress Notes (Signed)
 Pt's temperatures consistently elevated.  Acetaminophen given, with no decrease in temp.  Ice packs applied.

## 2024-01-10 NOTE — Progress Notes (Signed)
   01/10/24 1600  Spiritual Encounters  Type of Visit Follow up  Care provided to: Family;Significant other  Referral source Chaplain assessment  Reason for visit Routine spiritual support  OnCall Visit No  Spiritual Framework  Presenting Themes Goals in life/care;Other (comment) (Daughter and Significant Other stated that they have made some decisions for the Pt and will disclose those to the staff on Friday.)  Interventions  Spiritual Care Interventions Made Compassionate presence;Reflective listening  Intervention Outcomes  Outcomes Awareness of support;Connection to spiritual care

## 2024-01-10 NOTE — IPAL (Signed)
  Interdisciplinary Goals of Care Family Meeting   Date carried out: 01/10/2024  Location of the meeting: Conference Room   Member's involved: Nurse Practitioner and Family Member or next of kin  Durable Power of Attorney or acting medical decision maker: Pts daughter [name] and significant other [name]     Discussion: We discussed goals of care for [name] .  Following discussions this morning with Neurologist Dr. Bonnita Buttner regarding MRI Brain results and extremely poor if any chance of neurological meaningful recovery they have decided to change pts code status to DNR.  They have also decided they will likely transition pt to Comfort Measures Only on Friday 01/12/24 to allow family time to visit pt.   Code status: DNR   Disposition: Continue current acute care  Time spent for the meeting: 15 minutes     [name], AGNP  Pulmonary/Critical Care Pager [phone] (please enter 7 digits) PCCM Consult Pager [phone] (please enter 7 digits)  ;

## 2024-01-10 NOTE — Plan of Care (Signed)
  Problem: Metabolic: Goal: Ability to maintain appropriate glucose levels will improve 01/10/2024 1727 by Curtiss Dowdy, Student-RN Outcome: Progressing 01/10/2024 1702 by Curtiss Dowdy, Student-RN Outcome: Progressing   Problem: Nutritional: Goal: Maintenance of adequate nutrition will improve 01/10/2024 1727 by Curtiss Dowdy, Student-RN Outcome: Progressing 01/10/2024 1702 by Curtiss Dowdy, Student-RN Outcome: Progressing   Problem: Skin Integrity: Goal: Risk for impaired skin integrity will decrease Outcome: Progressing   Problem: Activity: Goal: Ability to tolerate increased activity will improve Outcome: Progressing   Problem: Activity: Goal: Ability to tolerate increased activity will improve Outcome: Progressing   Problem: Clinical Measurements: Goal: Diagnostic test results will improve Outcome: Progressing   Problem: Elimination: Goal: Will not experience complications related to bowel motility Outcome: Progressing   Problem: Pain Managment: Goal: General experience of comfort will improve and/or be controlled Outcome: Progressing

## 2024-01-10 NOTE — TOC Progression Note (Signed)
 Transition of Care [employer]) - Progression Note    Patient Details  Name: [name] MRN: [id number] Date of Birth: 06-18-1961  [facility] CM/SW Contact  Levenia Skalicky A Ranald Alessio, RN Phone Number: 01/10/2024, 12:14 PM  Clinical Narrative:    Chart reviewed.  Noted that patient was admitted with new onset a-fib with RVR and NStemi.  Noted that patient could likely have anoxic brain injury.  Neurology has been consulted.   Noted that family considering compassionate extubation and possibly transition to comfort care.    TOC will continue to follow and provide support for family.          Expected Discharge Plan and Services                                               Social Determinants of Health (SDOH) Interventions SDOH Screenings   Food Insecurity: Patient Unable To Answer (01/06/2024)  Housing: Patient Unable To Answer (01/06/2024)  Transportation Needs: Patient Unable To Answer (01/06/2024)  Utilities: Patient Unable To Answer (01/06/2024)  Tobacco Use: High Risk (01/05/2024)    Readmission Risk Interventions     No data to display

## 2024-01-10 NOTE — Progress Notes (Signed)
 NEUROLOGY CONSULT FOLLOW UP NOTE   Date of service: January 10, 2024 Patient Name: [name] MRN:  [id number] DOB:  [birth year]  Interval Hx/subjective  Seen and examined.  MRI completed and suggested of diffuse hypoxic/anoxic injury  Vitals   Vitals:   01/10/24 0322 01/10/24 0400 01/10/24 0500 01/10/24 0700  BP:  125/69  (!) 135/104  Pulse:      Resp:  20  (!) 21  Temp:  98.1 F (36.7 C)    TempSrc:  Oral    SpO2: 100%     Weight:   126.4 kg   Height:         Body mass index is 45.65 kg/m.  Physical Exam  General: Not been on sedation this morning HEENT: Normocephalic atraumatic Lungs: Vented Cardiovascular: Irregularly irregular Neurological exam Comatose, no response to voice. To noxious stimulation, there was no to minimal delayed eye-opening, but still does not track.. Nonverbal, does not follow any commands Cranial nerves: Pupils 3 mm, round, sluggishly reactive, gaze somewhat mild disconjugate and midline today, positive corneals, positive cough and gag,  Motor: No spontaneous movement Sensory exam: No withdrawal or attempted localize on noxious stimulation Coordination cannot be assessed  Medications  Current Facility-Administered Medications:    0.9 %  sodium chloride infusion, 250 mL, Intravenous, Continuous, Assaker, Jean-Pierre, MD, Last Rate: 10 mL/hr at 01/10/24 0600, Infusion Verify at 01/10/24 0600   aspirin chewable tablet 81 mg, 81 mg, Per Tube, Daily, Tressie Ellis, RPH, 81 mg at 01/09/24 1042   atorvastatin (LIPITOR) tablet 40 mg, 40 mg, Per Tube, Daily, Tressie Ellis, RPH, 40 mg at 01/09/24 1042   cefTRIAXone (ROCEPHIN) 2 g in sodium chloride 0.9 % 100 mL IVPB, 2 g, Intravenous, Q24H, Assaker, [place], MD   Chlorhexidine Gluconate Cloth 2 % PADS 6 each, 6 each, Topical, Daily, Ezequiel Essex, NP, 6 each at 01/09/24 2153   dextrose 50 % solution 50 mL, 1 ampule, Intravenous, PRN, Andris Baumann, MD, 50 mL at 01/05/24 2304   docusate  (COLACE) 50 MG/5ML liquid 100 mg, 100 mg, Per Tube, BID, Ezequiel Essex, NP, 100 mg at 01/09/24 2107   famotidine (PEPCID) tablet 20 mg, 20 mg, Per Tube, BID, Rust-Chester, Micheline Rough L, NP, 20 mg at 01/09/24 2107   feeding supplement (PROSource TF20) liquid 60 mL, 60 mL, Per Tube, BID, Kasa, Kurian, MD, 60 mL at 01/09/24 2054   feeding supplement (VITAL AF 1.2 CAL) liquid 1,000 mL, 1,000 mL, Per Tube, Continuous, Assaker, Jean-Pierre, MD, Last Rate: 50 mL/hr at 01/10/24 0600, Infusion Verify at 01/10/24 0600   fentaNYL (SUBLIMAZE) injection 50-200 mcg, 50-200 mcg, Intravenous, Q30 min PRN, Harlon Ditty D, NP, 100 mcg at 01/09/24 0217   free water 30 mL, 30 mL, Per Tube, Q4H, Assaker, Jean-Pierre, MD, 30 mL at 01/10/24 0750   furosemide (LASIX) injection 20 mg, 20 mg, Intravenous, Q12H, Lindajo Royal V, MD, 20 mg at 01/09/24 2100   heparin ADULT infusion 100 units/mL (25000 units/27mL), 1,650 Units/hr, Intravenous, Continuous, Kasa, Kurian, MD, Last Rate: 16.5 mL/hr at 01/10/24 0600, 1,650 Units/hr at 01/10/24 0600   hydrALAZINE (APRESOLINE) injection 5 mg, 5 mg, Intravenous, Q4H PRN, Tukov-Yual, Magdalene S, NP, 5 mg at 01/07/24 0445   influenza vac split trivalent PF (FLULAVAL) injection 0.5 mL, 0.5 mL, Intramuscular, Tomorrow-1000, Kasa, Kurian, MD   insulin aspart (novoLOG) injection 0-20 Units, 0-20 Units, Subcutaneous, Q4H, Nazari, Walid A, RPH, 3 Units at 01/10/24 0738   insulin aspart (novoLOG) injection  4 Units, 4 Units, Subcutaneous, Q4H, Nazari, Walid A, RPH, 4 Units at 01/10/24 0739   insulin glargine-yfgn (SEMGLEE) injection 12 Units, 12 Units, Subcutaneous, BID, Assaker, Jean-Pierre, MD, 12 Units at 01/09/24 2151   ipratropium-albuterol (DUONEB) 0.5-2.5 (3) MG/3ML nebulizer solution 3 mL, 3 mL, Nebulization, Q6H PRN, Nelson, Dana G, NP   levETIRAcetam (KEPPRA) IVPB 1000 mg/100 mL premix, 1,000 mg, Intravenous, Q12H, Eleni Griffin, MD, Stopped at 01/10/24 [phone]   metoprolol tartrate  (LOPRESSOR) injection 5 mg, 5 mg, Intravenous, Q6H PRN, Hudson, Caralyn, PA-C, 5 mg at 01/09/24 1817   metoprolol tartrate (LOPRESSOR) tablet 12.5 mg, 12.5 mg, Per Tube, BID, Rust-Chester, Jenni Mody L, NP, 12.5 mg at 01/09/24 2105   midazolam (VERSED) injection 1-2 mg, 1-2 mg, Intravenous, Q1H PRN, Rust-Chester, Jenni Mody L, NP   nitroGLYCERIN (NITROSTAT) SL tablet 0.4 mg, 0.4 mg, Sublingual, Q5 Min x 3 PRN, Lanetta Pion, MD   ondansetron (ZOFRAN) injection 4 mg, 4 mg, Intravenous, Q6H PRN, Lanetta Pion, MD   Oral care mouth rinse, 15 mL, Mouth Rinse, Q2H, Cleve Dale, MD, 15 mL at 01/10/24 0745   Oral care mouth rinse, 15 mL, Mouth Rinse, PRN, Cleve Dale, MD, 15 mL at 01/06/24 2200   pneumococcal 20-valent conjugate vaccine (PREVNAR 20) injection 0.5 mL, 0.5 mL, Intramuscular, Tomorrow-1000, Kasa, Kurian, MD   polyethylene glycol (MIRALAX / GLYCOLAX) packet 17 g, 17 g, Per Tube, Daily, Domingo Friend, NP, 17 g at 01/09/24 1042   propofol (DIPRIVAN) 1000 MG/100ML infusion, 0-50 mcg/kg/min, Intravenous, Titrated, Rust-Chester, Gara July, NP, Last Rate: 3.74 mL/hr at 01/10/24 0801, 5 mcg/kg/min at 01/10/24 0801   thiamine (VITAMIN B1) tablet 100 mg, 100 mg, Per Tube, Daily, Cleve Dale, MD, 100 mg at 01/09/24 1042  Labs and Diagnostic Imaging   CBC:  Recent Labs  Lab 01/05/24 1547 01/06/24 0431 01/09/24 0539 01/10/24 0447  WBC 11.9*   < > 21.2* 22.1*  NEUTROABS 7.9*  --   --   --   HGB 9.7*   < > 9.4* 9.8*  HCT 35.5*   < > 35.1* 36.4  MCV 77.9*   < > 78.0* 77.4*  PLT 450*   < > 366 363   < > = values in this interval not displayed.    Basic Metabolic Panel:  Lab Results  Component Value Date   NA 138 01/10/2024   K 4.3 01/10/2024   CO2 30 01/10/2024   GLUCOSE 150 (H) 01/10/2024   BUN 52 (H) 01/10/2024   CREATININE 1.20 (H) 01/10/2024   CALCIUM 7.9 (L) 01/10/2024   GFRNONAA 51 (L) 01/10/2024   GFRAA >60 09/20/2016   HgbA1c:  Lab Results  Component Value Date   HGBA1C  7.2 (H) 01/05/2024    INR  Lab Results  Component Value Date   INR 1.4 (H) 01/05/2024   APTT  Lab Results  Component Value Date   APTT 87 (H) 01/05/2024   CT Head without contrast(Personally reviewed): 01/06/2024-no acute intracranial process  MRI brain without contrast 01/09/2024 reviewed personally-cerebral and cerebellar abnormal signal likely reflecting sequela of hypoxic ischemic injury in the setting of reported cardiac arrest.  Routine EEG with no seizures  Assessment   ELISABET GUTZMER is a 63 y.o. female past medical history of CAD status post PCI, PAD status post right BKA, left carotid endarterectomy, prior stroke in 2021, morbid obesity with sleep apnea on CPAP, COPD, left-sided diabetic hemichorea who was admitted with new onset atrial fibrillation with RVR, NSTEMI and  acute decompensated heart failure, acute respiratory failure requiring oxygen supplementation who suffered an inpatient cardiac arrest on the evening of 01/06/2024 after a brief bout of severe bradycardia. Time to ROSC was 5 minutes.  Immediately after ROSC developed, she had persistent eyelid myoclonus which then resolved. Remained comatose on exam over the last 2 days for the outgoing neurologist who handed off her care to me. She was a GCS of 3, now may be a GCS of 4 with delayed eye-opening to noxious stimulation today. 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 Imaging in the form of MRI also suggestive of diffuse hypoxic/anoxic brain injury  Impression Myoclonic seizures postcardiac arrest-likely hypoxic/anoxic brain injury Anoxic brain injury  Recommendations  Continue Keppra 1000 twice daily Continue supportive care Discussed in detail with family that the chances of neurologically meaningful recovery, with the current history, exam findings and imaging findings portend extremely poor if any chance of neurologically meaningful recovery. Daughter and boyfriend and father were  at bedside.  I answered all their questions. The family did say that she would not want to live like this and are contemplating compassionate extubation and possibly transitioning to comfort measures. I relayed my plan to Janey Meek NP from the [facility] service I will be available with questions as needed.  ______________________________________________________________________   Leala Prince, MD [facility]   CRITICAL CARE ATTESTATION Performed by: Tona Francis, MD Total critical care time: 50 minutes Critical care time was exclusive of separately billable procedures and treating other patients and/or supervising APPs/Residents/Students Critical care was necessary to treat or prevent imminent or life-threatening deterioration. This patient is critically ill and at significant risk for neurological worsening and/or death and care requires constant monitoring. Critical care was time spent personally by me on the following activities: development of treatment plan with patient and/or surrogate as well as nursing, discussions with consultants, evaluation of patient's response to treatment, examination of patient, obtaining history from patient or surrogate, ordering and performing treatments and interventions, ordering and review of laboratory studies, ordering and review of radiographic studies, pulse oximetry, re-evaluation of patient's condition, participation in multidisciplinary rounds and medical decision making of high complexity in the care of this patient.

## 2024-01-11 DIAGNOSIS — G9341 Metabolic encephalopathy: Secondary | ICD-10-CM | POA: Diagnosis not present

## 2024-01-11 DIAGNOSIS — I5041 Acute combined systolic (congestive) and diastolic (congestive) heart failure: Secondary | ICD-10-CM | POA: Diagnosis not present

## 2024-01-11 DIAGNOSIS — I4891 Unspecified atrial fibrillation: Secondary | ICD-10-CM | POA: Diagnosis not present

## 2024-01-11 DIAGNOSIS — I469 Cardiac arrest, cause unspecified: Secondary | ICD-10-CM | POA: Diagnosis not present

## 2024-01-11 LAB — CBC
HCT: 34.6 % — ABNORMAL LOW (ref 36.0–46.0)
Hemoglobin: 9.4 g/dL — ABNORMAL LOW (ref 12.0–15.0)
MCH: 20.9 pg — ABNORMAL LOW (ref 26.0–34.0)
MCHC: 27.2 g/dL — ABNORMAL LOW (ref 30.0–36.0)
MCV: 77.1 fL — ABNORMAL LOW (ref 80.0–100.0)
Platelets: 281 10*3/uL (ref 150–400)
RBC: 4.49 MIL/uL (ref 3.87–5.11)
RDW: 17.6 % — ABNORMAL HIGH (ref 11.5–15.5)
WBC: 21.2 10*3/uL — ABNORMAL HIGH (ref 4.0–10.5)
nRBC: 0.9 % — ABNORMAL HIGH (ref 0.0–0.2)

## 2024-01-11 LAB — BASIC METABOLIC PANEL WITH GFR
Anion gap: 11 (ref 5–15)
BUN: 62 mg/dL — ABNORMAL HIGH (ref 8–23)
CO2: 31 mmol/L (ref 22–32)
Calcium: 8 mg/dL — ABNORMAL LOW (ref 8.9–10.3)
Chloride: 99 mmol/L (ref 98–111)
Creatinine, Ser: 1.43 mg/dL — ABNORMAL HIGH (ref 0.44–1.00)
GFR, Estimated: 41 mL/min — ABNORMAL LOW (ref >60)
Glucose, Bld: 158 mg/dL — ABNORMAL HIGH (ref 70–99)
Potassium: 4.1 mmol/L (ref 3.5–5.1)
Sodium: 141 mmol/L (ref 135–145)

## 2024-01-11 LAB — GLUCOSE, CAPILLARY
Glucose-Capillary: 146 mg/dL — ABNORMAL HIGH (ref 70–99)
Glucose-Capillary: 159 mg/dL — ABNORMAL HIGH (ref 70–99)
Glucose-Capillary: 168 mg/dL — ABNORMAL HIGH (ref 70–99)
Glucose-Capillary: 184 mg/dL — ABNORMAL HIGH (ref 70–99)
Glucose-Capillary: 197 mg/dL — ABNORMAL HIGH (ref 70–99)
Glucose-Capillary: 201 mg/dL — ABNORMAL HIGH (ref 70–99)

## 2024-01-11 LAB — HEPARIN LEVEL (UNFRACTIONATED)
Heparin Unfractionated: 0.72 [IU]/mL — ABNORMAL HIGH (ref 0.30–0.70)
Heparin Unfractionated: 0.77 [IU]/mL — ABNORMAL HIGH (ref 0.30–0.70)

## 2024-01-11 LAB — CULTURE, BLOOD (ROUTINE X 2)
Culture: NO GROWTH
Culture: NO GROWTH
Special Requests: ADEQUATE

## 2024-01-11 LAB — MAGNESIUM: Magnesium: 2.5 mg/dL — ABNORMAL HIGH (ref 1.7–2.4)

## 2024-01-11 LAB — PHOSPHORUS: Phosphorus: 3.5 mg/dL (ref 2.5–4.6)

## 2024-01-11 LAB — TRIGLYCERIDES: Triglycerides: 141 mg/dL (ref <150)

## 2024-01-11 MED ORDER — ACETAMINOPHEN 325 MG PO TABS
650.0000 mg | ORAL_TABLET | ORAL | Status: DC | PRN
  Administered 2024-01-11: 650 mg

## 2024-01-11 NOTE — Progress Notes (Signed)
   01/11/24 1300  Spiritual Encounters  Type of Visit Initial  Care provided to: Family  Referral source Family  Reason for visit Routine spiritual support  OnCall Visit No   Patients friend stopped chaplain in the hallway and asked to speak with me. She shared that she and the patient have been friends for over 28 years and seeing her this way has been very hard. The friend talked about the grief she feels around her friend being moved to comfort care but said that she knows that the patient is in Gods hands. Chaplain offered words of comfort and hope to her and informed her that chaplain services is available for spiritual and emotional support whenever she needs.

## 2024-01-11 NOTE — Consult Note (Signed)
 PHARMACY - ANTICOAGULATION CONSULT NOTE  Pharmacy Consult for IV Heparin Indication: chest pain/ACS  Patient Measurements: Height: 5' 5.51" (166.4 cm) Weight: 126.5 kg (278 lb 14.1 oz) IBW/kg (Calculated) : 58.18 HEPARIN DW (KG): 88  Labs: Recent Labs    01/09/24 0539 01/10/24 0447 01/11/24 0423 01/11/24 0616 01/11/24 1544  HGB 9.4* 9.8* 9.4*  --   --   HCT 35.1* 36.4 34.6*  --   --   PLT 366 363 281  --   --   HEPARINUNFRC 0.41 0.31  --  0.72* 0.77*  CREATININE 1.17* 1.20* 1.43*  --   --    Estimated Creatinine Clearance: 54.4 mL/min (A) (by C-G formula based on SCr of 1.43 mg/dL (H)).  Medical History: Past Medical History:  Diagnosis Date   Anemia    Anxiety    Arthritis    CHF (congestive heart failure) (HCC)    COPD (chronic obstructive pulmonary disease) (HCC)    Coronary artery disease    Diabetes mellitus without complication (HCC)    Fibromyalgia    GERD (gastroesophageal reflux disease)    Hypertension    Peripheral vascular disease (HCC)    Sleep apnea    Stroke (HCC)    Medications:  No anticoagulation prior to admission per my chart review  Assessment: 63 y/o F with medical history as above presenting to the ED 4/11 with weakness, SOB. Troponin elevated. Pharmacy consulted to manage heparin infusion for ACS.  4/11:  HL @ 2256 = < 0.1, SUBtherapeutic 4/12 0854 HL 0.15   SUBtherapeutic  1400 >> 1650 4/12 2327 HL < 0.1  SUBtherapeutic  1650 units/hr  4/13 0516 HL 0.41   Therapeutic X 1  4/13 1106 HL 0.51   Therapeutic X 2  4/14 0412 HL 0.30   Therapeutic X 3 4/15 0539 HL 0.41   Therapeutic x 4 4/16 0447 HL 0.31    Therapeutic x 5 4/17 0423 HL 0.72    Supratherapeutic 4/17 1544 HL 0.77    Supratherapeutic   Goal of Therapy:  Heparin level 0.3-0.7 units/ml Monitor platelets by anticoagulation protocol: Yes   Plan:  -- HL remain supratherapeutic  -- Decrease heparin infusion to 1450 units/hr -- Recheck HL 8 hours after rate change  -- Daily  CBC per protocol while on IV heparin -- Family has decided to transition patient to comfort care measures tomorrow at 12 PM   Gunnard Dorrance, PharmD Pharmacy Resident  01/11/2024 4:24 PM

## 2024-01-11 NOTE — Progress Notes (Addendum)
 [facility] CARDIOLOGY PROGRESS NOTE   Patient ID: [name] MRN: [id number] DOB/AGE: [birth year] 63 y.o.  Admit date: 01/05/2024 Referring Physician Dr. Para March Primary Physician Sandrea Hughs, NP  Primary Cardiologist None Reason for Consultation AF RVR  HPI: [name] is a 63 y.o. female with a past medical history of coronary artery disease s/p PCI, OSA on CPAP, COPD, PAD s/p right BKA, left carotid endarterectomy , prior CVA (2021) who presented to the ED on 01/05/2024 for shortness of breath and fatigue. Patient was found to have new onset atrial fibrillation with RVR. Cardiology was consulted.  Interval History:  -Patient remains intubated and sedated s/p cardiac arrest (04/13). MRI suggestive of severe brain injury, family currently deciding on organ procurement.  -HR improved today. BP stable. -Renal function slightly up today.   Review of systems complete and found to be negative unless listed above    Vitals:   01/11/24 0630 01/11/24 0700 01/11/24 0746 01/11/24 0800  BP: 126/64 117/66  110/70  Pulse: 83 (!) 59  (!) 50  Resp: (!) 21 20  20   Temp:   97.9 F (36.6 C)   TempSrc:   Bladder   SpO2: 95% 98%  100%  Weight:      Height:         Intake/Output Summary (Last 24 hours) at 01/11/2024 0920 Last data filed at 01/11/2024 0841 Gross per 24 hour  Intake 1719.55 ml  Output 2850 ml  Net -1130.45 ml     PHYSICAL EXAM General: Chronically ill appearing female, well nourished, in no acute distress. HEENT: Normocephalic and atraumatic. Neck: No JVD.  Lungs: Intubated. Mechanical breath sounds. Heart: HRRR. Normal S1 and S2 without gallops or murmurs. Radial & DP pulses 2+ bilaterally. Abdomen: Non-distended appearing.  Msk: Normal strength and tone for age. Extremities: No clubbing, cyanosis or edema. Right BKA.   LABS: Basic Metabolic Panel: Recent Labs    01/10/24 0447 01/11/24 0423  NA 138 141  K 4.3 4.1  CL 96* 99  CO2 30 31  GLUCOSE 150*  158*  BUN 52* 62*  CREATININE 1.20* 1.43*  CALCIUM 7.9* 8.0*  MG 2.4 2.5*  PHOS 3.2 3.5   Liver Function Tests: Recent Labs    01/09/24 0539  AST 75*  ALT 150*  ALKPHOS 94  BILITOT 1.0  PROT 5.6*  ALBUMIN 2.3*   No results for input(s): "LIPASE", "AMYLASE" in the last 72 hours.  CBC: Recent Labs    01/10/24 0447 01/11/24 0423  WBC 22.1* 21.2*  HGB 9.8* 9.4*  HCT 36.4 34.6*  MCV 77.4* 77.1*  PLT 363 281   Cardiac Enzymes: No results for input(s): "CKTOTAL", "CKMB", "CKMBINDEX", "TROPONINIHS" in the last 72 hours.  BNP: No results for input(s): "BNP" in the last 72 hours.  D-Dimer: No results for input(s): "DDIMER" in the last 72 hours. Hemoglobin A1C: No results for input(s): "HGBA1C" in the last 72 hours.  Fasting Lipid Panel: Recent Labs    01/11/24 0423  TRIG 141   Thyroid Function Tests: No results for input(s): "TSH", "T4TOTAL", "T3FREE", "THYROIDAB" in the last 72 hours.  Invalid input(s): "FREET3"  Anemia Panel: No results for input(s): "VITAMINB12", "FOLATE", "FERRITIN", "TIBC", "IRON", "RETICCTPCT" in the last 72 hours.  MR BRAIN WO CONTRAST Result Date: 01/09/2024 CLINICAL DATA:  Mental status change, unknown cause; cardiac arrest April 12 EXAM: MRI HEAD WITHOUT CONTRAST TECHNIQUE: Multiplanar, multiecho pulse sequences of the brain and surrounding structures were obtained without intravenous contrast. COMPARISON:  November 2023 FINDINGS: Brain: There is relatively diffuse cortical mildly reduced diffusion. Basal ganglia also demonstrate increased diffusion and T2 signal bilaterally. There is somewhat patchy cerebellar mildly reduced diffusion. Prominence of the ventricles and sulci reflecting stable parenchymal volume loss. Other confluent areas of T2 hyperintensity in the supratentorial and pontine white matter probably reflects stable advanced chronic microvascular ischemic changes. Chronic small vessel infarcts of the central white matter  bilaterally and left cerebellum. Vascular: Major vessel flow voids at the skull base are preserved. Skull and upper cervical spine: Normal marrow signal is preserved. Sinuses/Orbits: Paranasal sinuses are aerated. Orbits are unremarkable. Other: Sella is unremarkable. Patchy bilateral mastoid fluid opacification. IMPRESSION: Cerebral and cerebellar abnormal signal is described likely reflecting sequelae of hypoxic/ischemic injury in the setting of reported cardiac arrest. Electronically Signed   By: Guadlupe Spanish M.D.   On: 01/09/2024 19:58   EEG adult Result Date: 01/09/2024 Charlsie Quest, MD     01/09/2024  2:11 PM Patient Name: [name] MRN: [id number] Epilepsy Attending: Charlsie Quest Referring Physician/Provider: Milon Dikes, MD Date: 01/09/2024 Duration: 31.53 mins  Patient history:  63 yo F s/p cardiac arrest developed persistent eyelid myoclonus which has now resolved. EEG to evaluate for seizure  Level of alertness:  comatose  AEDs during EEG study: LEV, propofol  Technical aspects: This EEG study was done with scalp electrodes positioned according to the 10-20 International system of electrode placement. Electrical activity was reviewed with band pass filter of 1-70Hz , sensitivity of 7 uV/mm, display speed of 24mm/sec with a 60Hz  notched filter applied as appropriate. EEG data were recorded continuously and digitally stored.  Video monitoring was available and reviewed as appropriate.  Description: EEG showed continuous generalized background suppression. Hyperventilation and photic stimulation were not performed.    ABNORMALITY - Background suppression, generalized  IMPRESSION: This study is suggestive of profound diffuse encephalopathy. No seizures or epileptiform discharges were seen throughout the recording.  [name]     ECHO as above  TELEMETRY reviewed by me 01/11/24: SR 70s, frequent ectopy  EKG reviewed by me 01/11/24: atrial fibrillations with RBBB, rate 139  bpm  DATA reviewed by me 01/11/24: last 24h vitals tele labs imaging I/O, hospitalist, critical care, neurology progress notes  Principal Problem:   Atrial fibrillation with rapid ventricular response (HCC) Active Problems:   Essential hypertension, benign   Obesity, Class III, BMI 40-49.9 (morbid obesity) (HCC)   PAD (peripheral artery disease) (HCC)   Uncontrolled type 2 diabetes mellitus with hypoglycemia, with long-term current use of insulin (HCC)   NSTEMI (non-ST elevated myocardial infarction) (HCC)   CAD S/P percutaneous coronary angioplasty   OSA on CPAP   Hx of right BKA (HCC)   History of left-sided carotid endarterectomy   Acute CHF (congestive heart failure) (HCC)   Acute hypercapnic respiratory failure (HCC)   Cardiac arrest (HCC)    ASSESSMENT AND PLAN:  NAILAH LUEPKE is a 63 y.o. female with a past medical history of coronary artery disease s/p PCI, OSA on CPAP, COPD, PAD s/p right BKA, left carotid endarterectomy , prior CVA (2021) who presented to the ED on 01/05/2024 for shortness of breath and fatigue. Patient was found to have new onset atrial fibrillation with RVR. Cardiology was consulted.  # Cardiac arrest # Acute respiratory failure  # New onset atrial fibrillation RVR  # Acute HFrEF with grade II diastolic dysfunction # NSTEMI, Hx Coronary artery disease s/p PCI # Hx of CVA (2021) Patient presented  to the ED on 04/11 with shortness of breath, fatigue, palpitations and EKG revealed new onset atrial fibrillation with RVR. CT revealed right pleural effusion, pulmonary edema and no evidence of pulmonary embolism. CXR showed cardiomegaly. Trops elevated and trending 1,367 > 1,686 > 1,827 > 1,654 > 1,467 > 847. BNP elevated 1,263 > 750. Echo this admission shows EF 35-40%, global hypokinesis, borderline LVH, grade II diastolic dysfunction. IV metoprolol x2 given with no significant improvement in HR. IV dilt infusion started for tachycardia in 130s then stopped due to  reduced EF. IV amio infusion started for atrial fibrillation on 04/13 and about 20 minutes later patient became bradycardia then pulseless asystole Code blue was called for Cardiac arrest with ROSC obtained 13 minutes. Patient remains intubated and sedated. IV Neo gtt stopped at 1023 on 04/15.  -Continue metoprolol tartrate 12.5 mg twice daily per tube. -Continue heparin gtt given atrial fibrillation.  -Continue ASA 81 mg, atorvastatin 40 mg per tube. -Continue IV lasix 40 mg BID. Closely monitor renal function and UOP. -No plan for further cardiac diagnostics at this time as patient is intubated. -Appreciate PCCM and neurology.  Patient is critically ill with multiorgan failure and multiple comorbidities. Prognosis is guarded. Family is deciding on organ procurement, if they decide not to pursue this then will likely transition to Comfort Measures tomorrow.   ADDENDUM: Family has decided to make patient DNR today and will transition to comfort measures tomorrow. Cardiology will sign off but please Haiku with questions or other needs.   This patient's case was discussed and created with Dr. Custovic and she is in agreement.  Signed:  Hamp Levine, PA-C  01/11/2024, 9:20 AM [facility]

## 2024-01-11 NOTE — Progress Notes (Signed)
 Updated pts significant other and daughter at bedside and discussed plan of care.  They DO NOT want to proceed with organ procurement and would like to transition pt to Comfort Measures Only on 01/12/24 at 12:00 pm.  Will continue to monitor and assess pt   Janey Meek, AGNP  Pulmonary/Critical Care Pager [phone] (please enter 7 digits) PCCM Consult Pager [phone] (please enter 7 digits)

## 2024-01-11 NOTE — Plan of Care (Signed)
  Problem: Fluid Volume: Goal: Ability to maintain a balanced intake and output will improve Outcome: Progressing   Problem: Metabolic: Goal: Ability to maintain appropriate glucose levels will improve Outcome: Progressing   Problem: Education: Goal: Knowledge of disease or condition will improve Outcome: Not Met (add Reason) Note: Pt. now DNR   Problem: Health Behavior/Discharge Planning: Goal: Ability to safely manage health-related needs after discharge will improve Outcome: Not Met (add Reason) Note: Pt. Condition not improving, pt. Is now a DNR

## 2024-01-11 NOTE — Consult Note (Signed)
 PHARMACY - ANTICOAGULATION CONSULT NOTE  Pharmacy Consult for IV Heparin Indication: chest pain/ACS  Patient Measurements: Height: 5' 5.51" (166.4 cm) Weight: 126.5 kg (278 lb 14.1 oz) IBW/kg (Calculated) : 58.18 HEPARIN DW (KG): 88  Labs: Recent Labs    01/09/24 0539 01/10/24 0447 01/11/24 0423 01/11/24 0616  HGB 9.4* 9.8* 9.4*  --   HCT 35.1* 36.4 34.6*  --   PLT 366 363 281  --   HEPARINUNFRC 0.41 0.31  --  0.72*  CREATININE 1.17* 1.20* 1.43*  --     Estimated Creatinine Clearance: 54.4 mL/min (A) (by C-G formula based on SCr of 1.43 mg/dL (H)).   Medical History: Past Medical History:  Diagnosis Date   Anemia    Anxiety    Arthritis    CHF (congestive heart failure) (HCC)    COPD (chronic obstructive pulmonary disease) (HCC)    Coronary artery disease    Diabetes mellitus without complication (HCC)    Fibromyalgia    GERD (gastroesophageal reflux disease)    Hypertension    Peripheral vascular disease (HCC)    Sleep apnea    Stroke (HCC)     Medications:  No anticoagulation prior to admission per my chart review  Assessment: 63 y/o F with medical history as above presenting to the ED 4/11 with weakness, SOB. Troponin elevated. Pharmacy consulted to manage heparin infusion for ACS.  4/11:  HL @ 2256 = < 0.1, SUBtherapeutic 4/12 0854 HL 0.15   SUBtherapeutic  1400 >> 1650 4/12 2327 HL < 0.1  SUBtherapeutic  1650 units/hr  4/13 0516 HL 0.41   Therapeutic X 1  4/13 1106 HL 0.51   Therapeutic X 2  4/14 0412 HL 0.30   Therapeutic X 3 4/15 0539 HL 0.41   Therapeutic x 4 4/16 0447 HL 0.31    Therapeutic x 5 4/17 0423 HL 0.72 Supratherapeutic  Goal of Therapy:  Heparin level 0.3-0.7 units/ml Monitor platelets by anticoagulation protocol: Yes   Plan:  --Decrease heparin infusion to 1550 units/hr --Recheck HL in 8 hrs after rate change --Daily CBC per protocol while on IV heparin --Family likely to decided to transition pt to comfort care today  Coretta Dexter, PharmD, [facility] 01/11/2024 6:56 AM

## 2024-01-11 NOTE — Progress Notes (Signed)
 SLP Cancellation Note  Patient Details Name: [name] MRN: [id number] DOB: 04/26/1961   Cancelled treatment:       Reason Eval/Treat Not Completed: Medical issues which prohibited therapy (Pt continues to be unresponsive and intubated. Per chart" extremely poor if any chance of neurological meaningful recovery..." Pt now DNR, and "will likely transition pt to Comfort Measures Only on Friday 01/12/24 to allow family time to visit pt." SLP to s/o at this time. Please reconsult should pt's medical status and/or GOC warrant.)  Dia Forget, M.S., CCC-SLP Speech-Language Pathologist [facility] [phone] [place])  Adin Honour 01/11/2024, 7:46 AM

## 2024-01-11 NOTE — Progress Notes (Signed)
 PHARMACY CONSULT NOTE  Pharmacy Consult for Electrolyte Monitoring and Replacement   Recent Labs: Potassium (mmol/L)  Date Value  01/11/2024 4.1  01/22/2015 4.2   Magnesium (mg/dL)  Date Value  40/98/1191 2.5 (H)  08/09/2014 1.2 (L)   Calcium (mg/dL)  Date Value  47/82/9562 8.0 (L)   Calcium, Total (mg/dL)  Date Value  13/04/6577 9.2   Albumin (g/dL)  Date Value  46/96/2952 2.3 (L)  08/08/2014 3.1 (L)   Phosphorus (mg/dL)  Date Value  84/13/2440 3.5   Sodium (mmol/L)  Date Value  01/11/2024 141  01/22/2015 137   Assessment: 63 yo female who [facility] ER on 04/11 via EMS from home with c/o weakness and shortness of breath onset 3 days prior to presentation. Pharmacy is asked to follow and replace electrolytes while in CCU  Diuretics: Lasix 20 mg IV q12h  Goal of Therapy:  Potassium 4.0 - 5.1 mmol/L Magnesium 20 - 2.4 mg/dL All Other Electrolytes WNL  Plan:  --No electrolyte replacement indicated at this time --Sign off on electrolyte consult given stability  Page Boast 01/11/2024 7:21 AM

## 2024-01-11 NOTE — Progress Notes (Signed)
 Pt. Temp 103.3 just started just started on cooling blanket ice pack place on groin ,underarm and neck to help to cool pt. down sedation was increase per provider order Recardo Canal NP ) @ bedside to prevent pt. from shivering and closely monitored

## 2024-01-11 NOTE — Plan of Care (Signed)
 Problem: Education: Goal: Ability to describe self-care measures that may prevent or decrease complications (Diabetes Survival Skills Education) will improve Outcome: Not Progressing Goal: Individualized Educational Video(s) Outcome: Not Progressing   Problem: Coping: Goal: Ability to adjust to condition or change in health will improve Outcome: Not Progressing   Problem: Fluid Volume: Goal: Ability to maintain a balanced intake and output will improve Outcome: Not Progressing   Problem: Health Behavior/Discharge Planning: Goal: Ability to identify and utilize available resources and services will improve Outcome: Not Progressing Goal: Ability to manage health-related needs will improve Outcome: Not Progressing   Problem: Metabolic: Goal: Ability to maintain appropriate glucose levels will improve Outcome: Not Progressing   Problem: Nutritional: Goal: Maintenance of adequate nutrition will improve Outcome: Not Progressing Goal: Progress toward achieving an optimal weight will improve Outcome: Not Progressing   Problem: Skin Integrity: Goal: Risk for impaired skin integrity will decrease Outcome: Not Progressing   Problem: Tissue Perfusion: Goal: Adequacy of tissue perfusion will improve Outcome: Not Progressing   Problem: Education: Goal: Understanding of cardiac disease, CV risk reduction, and recovery process will improve Outcome: Not Progressing Goal: Individualized Educational Video(s) Outcome: Not Progressing   Problem: Activity: Goal: Ability to tolerate increased activity will improve Outcome: Not Progressing   Problem: Cardiac: Goal: Ability to achieve and maintain adequate cardiovascular perfusion will improve Outcome: Not Progressing   Problem: Health Behavior/Discharge Planning: Goal: Ability to safely manage health-related needs after discharge will improve Outcome: Not Progressing   Problem: Education: Goal: Knowledge of disease or condition  will improve Outcome: Not Progressing Goal: Understanding of medication regimen will improve Outcome: Not Progressing Goal: Individualized Educational Video(s) Outcome: Not Progressing   Problem: Activity: Goal: Ability to tolerate increased activity will improve Outcome: Not Progressing   Problem: Cardiac: Goal: Ability to achieve and maintain adequate cardiopulmonary perfusion will improve Outcome: Not Progressing   Problem: Health Behavior/Discharge Planning: Goal: Ability to safely manage health-related needs after discharge will improve Outcome: Not Progressing   Problem: Education: Goal: Knowledge of General Education information will improve Description: Including pain rating scale, medication(s)/side effects and non-pharmacologic comfort measures Outcome: Not Progressing   Problem: Health Behavior/Discharge Planning: Goal: Ability to manage health-related needs will improve Outcome: Not Progressing   Problem: Clinical Measurements: Goal: Ability to maintain clinical measurements within normal limits will improve Outcome: Not Progressing Goal: Will remain free from infection Outcome: Not Progressing Goal: Diagnostic test results will improve Outcome: Not Progressing Goal: Respiratory complications will improve Outcome: Not Progressing Goal: Cardiovascular complication will be avoided Outcome: Not Progressing   Problem: Activity: Goal: Risk for activity intolerance will decrease Outcome: Not Progressing   Problem: Nutrition: Goal: Adequate nutrition will be maintained Outcome: Not Progressing   Problem: Coping: Goal: Level of anxiety will decrease Outcome: Not Progressing   Problem: Elimination: Goal: Will not experience complications related to bowel motility Outcome: Not Progressing Goal: Will not experience complications related to urinary retention Outcome: Not Progressing   Problem: Pain Managment: Goal: General experience of comfort will improve  and/or be controlled Outcome: Not Progressing   Problem: Safety: Goal: Ability to remain free from injury will improve Outcome: Not Progressing   Problem: Skin Integrity: Goal: Risk for impaired skin integrity will decrease Outcome: Not Progressing   Problem: Activity: Goal: Ability to tolerate increased activity will improve Outcome: Not Progressing   Problem: Respiratory: Goal: Ability to maintain a clear airway and adequate ventilation will improve Outcome: Not Progressing   Problem: Role Relationship: Goal: Method of communication will improve  Outcome: Not Progressing   Problem: Education: Goal: Ability to demonstrate management of disease process will improve Outcome: Not Progressing Goal: Ability to verbalize understanding of medication therapies will improve Outcome: Not Progressing Goal: Individualized Educational Video(s) Outcome: Not Progressing   Problem: Activity: Goal: Capacity to carry out activities will improve Outcome: Not Progressing

## 2024-01-11 NOTE — Progress Notes (Signed)
 NAME:  [name], MRN:  [id number], DOB:  Apr 11, 1961, LOS: 6 ADMISSION DATE:  01/05/2024, CONSULTATION DATE: 01/06/2024 REFERRING MD: Dr. Joylene Igo, CHIEF COMPLAINT: Cardiac Arrest    Brief Pt Description / Synopsis:  63 y.o. female admitted with new onset A.fib with RVR, NSTEMI, and decompensated HFrEF.  On 01/06/24, Hospital course complicated by bradycardia with progression to Asystole Cardiac Arrest (ROSC after 5 minutes).  Post arrest with development of eyelid myoclonus and concern for anoxic brain injury.   History of Present Illness:  This is a 63 yo female who presented to [facility] ER on 04/11 via EMS from home with c/o weakness and shortness of breath onset 3 days prior to presentation.   At baseline she wears CPAP at night, but is not on chronic O2.  Per EMS pt found to be in atrial fibrillation with rvr.  She does not have a hx of atrial fibrillation.  She was also hypoglycemic CBG 46, EMS administered glucagon.  However, she remained hypoglycemic and received 1 amp of D50.  CBG stabilized.    ED Course  Upon arrival to the ER initial EKG revealed atrial fibrillation with right bundle branch block, hr 139 but no acute ST elevation or depression present.  She received 1L iv fluid bolus.  Significant lab results were: glucose 58/BUN 27/creatinine 1.21/albumin 3.1/BNP 1,263.5/troponin 1,367/wbc 11.9/hgb 9.7/platelet count 450.  CTA Chest negative for PE, but concerning for interstitial edema.  She received 5 mg of iv metoprolol x2 with no significant improvement in heart rate.  Heparin bolus followed by heparin gtt administered for possible NSTEMI.  She was subsequently admitted to the progressive care unit per hospitalist team for additional workup and treatment, however she remained in the ER pending bed availability.  See detailed hospital course below under significant events.   CTA Chest PE:  No evidence acute pulmonary embolism. RIGHT pleural effusion with RIGHT basilar atelectasis. Mild  interstitial thickening at the lung bases suggest interstitial edema. Ill-defined ground-glass densities in the upper lobes. Findings suggest mild pulmonary edema. Benign LEFT adrenal adenoma.  CXR: Detail limited exam without definite plain film evidence of acute process. Enlarged heart.  Pertinent  Medical History  Anemia  Anxiety  Arthritis  COPD CAD Type II Diabetes Mellitus  Fibromyalgia  GERD  HTN  PVD OSA Stroke  CHF   Micro Data:  4/11: SARS-CoV-2/flu/RSV PCR>>negative 04/11: HIV screen>>non reactive 4/12: MRSA PCR>>negative 4/12: Blood culture x 2>> no growth to date  Antimicrobials:   Anti-infectives (From admission, onward)    Start     Dose/Rate Route Frequency Ordered Stop   01/10/24 1000  cefTRIAXone (ROCEPHIN) 2 g in sodium chloride 0.9 % 100 mL IVPB        2 g 200 mL/hr over 30 Minutes Intravenous Every 24 hours 01/09/24 1028 01/10/2024 0959   01/07/24 1100  piperacillin-tazobactam (ZOSYN) IVPB 3.375 g        3.375 g 12.5 mL/hr over 240 Minutes Intravenous Every 8 hours 01/07/24 1001 01/09/24 2359      Significant Hospital Events: Including procedures, antibiotic start and stop dates in addition to other pertinent events   04/11: Pt admitted to the progressive care unit with new onset atrial fibrillation with rvr, acute respiratory failure secondary to acute CHF exacerbation, and NSTEMI.   04/12: Pt remained in atrial fibrillation with rvr amiodarone bolus administered followed by amiodarone gtt at 60 mg/hr.  Following administration she became bradycardic and pulseless ACLS protocol initiated with ROSC 13 minutes following  initiation of ACLS protocol.  Pt mechanically intubated during cardiac arrest.  Post cardiac arrest pt comatose, unable to follow commands, with no gag/oculocephalic reflexes present despite NOT receiving RSI medications and intermittent eyelid myoclonus present neurology consulted and cerebell initiated 04/13: Pt with worsening myoclonus,  cerebell remains in place and noted to have myoclonic seizures every 1-2 minutes suggestive of anoxic/hypoxic brain injury.  Propofol gtt ordered for suppression.  04/14: Plan for repeat EEG today.  On Neuro exam, currently off propofol, no myoclonus noted.  Does have + brainstem reflexes, somewhat opening eyes to noxious stimulation.  Allowing time for neuro prognostication, plan for MRI Brain tomorrow. 04/15: Overnight/this morning developed A.flutter w/ RVR.  Afebrile, requiring low dose Neosynephrine. Currently sedated on Propofol, will wean for Neuro exam. Repeating EEG, and plan for MRI Brain today. 04/16: Pt remains mechanically intubated on minimal settings, however neuro exam remains the same and MRI Brain suggestive of diffuse hypoxic/anoxic brain injury.  Code status changed to DNR per family request  04/17: Family deciding if they would like to proceed with organ procurement.  Neuro exam remains the same.  Pt febrile overnight tmax 103.1 F requiring cooling blanket to maintain normothermia   Interim History / Subjective:  As outlined above under significant events   Objective   Blood pressure 117/66, pulse (!) 59, temperature 97.9 F (36.6 C), temperature source Bladder, resp. rate 20, height 5' 5.51" (1.664 m), weight 126.5 kg, SpO2 98%.    Vent Mode: PRVC FiO2 (%):  [28 %] 28 % Set Rate:  [20 bmp] 20 bmp Vt Set:  [450 mL] 450 mL PEEP:  [5 cmH20] 5 cmH20 Plateau Pressure:  [21 cmH20-27 cmH20] 27 cmH20   Intake/Output Summary (Last 24 hours) at 01/11/2024 0816 Last data filed at 01/11/2024 0700 Gross per 24 hour  Intake 1714.15 ml  Output 2600 ml  Net -885.85 ml   Filed Weights   01/09/24 0500 01/10/24 0500 01/11/24 0500  Weight: 128.8 kg 126.4 kg 126.5 kg   Examination: General: Acutely-ill appearing obese female, NAD mechanically intubated  HENT: Supple, unable to assess for JVD due to body habitus, orally intubated Lungs: Faint rhonchi throughout, even, non labored,  overbreathing the vent Cardiovascular: NSR, s1s2, no m/r/g, 2+ radial/1+ left distal pulse, 1+ right femoral pulse, no edema  Abdomen: +BS x4, obese, soft, non distended  Extremities: Right BKA  Neuro: On low dose propofol, not following commands or withdraws from stimulation, + cough/gag/corneal reflexes, bilateral pupils very sluggish, no myoclonus GU: Indwelling foley catheter present draining yellow urine   Resolved Hospital Problem list     Assessment & Plan:   #Acute metabolic encephalopathy  #Myoclonic seizures post cardiac arrest~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 Severe chronic small vessel ischemic disease EEG 4/14 negative for myoclonus  MRI Brain 04/15: Cerebral and cerebellar abnormal signal is described likely reflecting sequelae of hypoxic/ischemic injury in the setting of reported cardiac arrest - Treatment of metabolic derangements as outlined below - Maintain RASS goal of 0  - Propofol as needed for suppression of myoclonus per neuro recommendations  - Avoid sedating medications as able  - Continue scheduled keppra 1g q12rs for myoclonus  - Neurology following, appreciate input - Seizure precautions  - Normothermia protocol completed  - WUA daily   #Cardiac arrest, suspect secondary to amiodarone (asystole) #NSTEMI  #New-onset atrial fibrillation with rvr / A-flutter w/ RVR #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  Hx: HTN, PVD, stroke, and CAD  Echo 01/06/2024: EF 35 to 40%; grade II diastolic dysfunction  - Continuous telemetry monitoring  - Continue scheduled metoprolol for rate control  - Continue heparin gtt: dosing per pharmacy  - Diurese as bp and renal function permits  - Hold antihypertensives for now  - Maintain map 65 or higher  - Continue aspirin and atorvastatin  - Cardiology following, appreciate input  - Troponin peaked at 1,827 - TSH normal:  2.6  #Acute hypoxic respiratory failure  #Mechanical intubation  Hx: COPD and OSA  - Full vent support, implement lung protective strategies - Plateau pressures less than 30 cm H20 - Wean FiO2 & PEEP as tolerated to maintain O2 sats 88 to 92% - Follow intermittent Chest X-ray & ABG as needed -Spontaneous Breathing Trials when respiratory parameters met and mental status permits ~ MENTAL STATUS CURRENTLY PRECLUDING EXTUBATION - Implement VAP Bundle - Prn Bronchodilators - Diuresis as BP and renal function permits ~ currently on 20 mg IV Lasix BID - ABX as above  #Acute kidney injury secondary to ATN ~ improving #Severe metabolic acidosis ~ resolved #Lactic acidosis  #Mild Hypokalemia - Monitor I&O's / urinary output - Follow BMP - Ensure adequate renal perfusion - Avoid nephrotoxic agents as able - Replace electrolytes as indicated ~ Pharmacy following for assistance with electrolyte replacement  #Transamanitis  - Trend hepatic function panel  - Avoid hepatotoxic agents as able   #Anemia without obvious signs of bleeding  - Trend CBC  - Monitor for s/sx of bleeding  - Transfuse for hgb <7   #Type II diabetes mellitus  #Hypoglycemia~resolved  - CBG's q4hrs  - SSI  - Follow hypo/hyperglycemic protocol  - Target CBG range 140 to 180  Pt with worsening myoclonus cerebell showing myoclonic seizures every 1-2 minutes suggestive of anoxic/hypoxic brain injury.  Pt with poor prognosis, code status changed to DNR.  Best Practice (right click and "Reselect all SmartList Selections" daily)   Diet/type: NPO; TF's DVT prophylaxis systemic heparin Pressure ulcer(s): N/A GI prophylaxis: PPI Lines: N/A Foley:  Yes, and it is still needed Code Status:  full code Last date of multidisciplinary goals of care discussion [01/11/2024]  04/17: Will update pts family when they arrive at bedside  Labs   CBC: Recent Labs  Lab 01/05/24 1547 01/06/24 0431 01/07/24 0516 01/08/24 0412  01/09/24 0539 01/10/24 0447 01/11/24 0423  WBC 11.9*   < > 25.5* 26.2* 21.2* 22.1* 21.2*  NEUTROABS 7.9*  --   --   --   --   --   --   HGB 9.7*   < > 9.9* 9.3* 9.4* 9.8* 9.4*  HCT 35.5*   < > 36.0 33.7* 35.1* 36.4 34.6*  MCV 77.9*   < > 75.2* 74.9* 78.0* 77.4* 77.1*  PLT 450*   < > 442* 317 366 363 281   < > = values in this interval not displayed.    Basic Metabolic Panel: Recent Labs  Lab 01/07/24 0516 01/08/24 0412 01/09/24 0539 01/10/24 0447 01/11/24 0423  NA 136 140 140 138 141  K 4.5 3.4* 3.9 4.3 4.1  CL 101 98 98 96* 99  CO2 23 31 31 30 31   GLUCOSE 198* 229* 253* 150* 158*  BUN 43* 45* 42* 52* 62*  CREATININE 2.05* 1.58* 1.17* 1.20* 1.43*  CALCIUM 8.4* 7.9* 7.7* 7.9* 8.0*  MG 2.3 2.3 2.2 2.4 2.5*  PHOS 5.7* 3.7 2.7 3.2 3.5   GFR: Estimated Creatinine Clearance:  54.4 mL/min (A) (by C-G formula based on SCr of 1.43 mg/dL (H)). Recent Labs  Lab 01/06/24 1658 01/07/24 0258 01/07/24 0516 01/08/24 0412 01/09/24 0539 01/10/24 0447 01/11/24 0423  WBC  --    < > 25.5* 26.2* 21.2* 22.1* 21.2*  LATICACIDVEN 4.9*  --  2.7*  --   --   --   --    < > = values in this interval not displayed.    Liver Function Tests: Recent Labs  Lab 01/05/24 1547 01/06/24 1549 01/07/24 0258 01/07/24 0516 01/09/24 0539  AST 24 101*  --  90* 75*  ALT 26 84*  --  110* 150*  ALKPHOS 95 99  --  101 94  BILITOT 0.6 1.0  --  0.9 1.0  PROT 7.2 6.2*  --  6.4* 5.6*  ALBUMIN 3.1* 2.7* 2.9* 2.8* 2.3*   Recent Labs  Lab 01/05/24 1547  LIPASE 21   No results for input(s): "AMMONIA" in the last 168 hours.  ABG    Component Value Date/Time   PHART 7.04 (LL) 01/06/2024 1354   PCO2ART 62 (H) 01/06/2024 1354   PO2ART 116 (H) 01/06/2024 1354   HCO3 24.4 01/06/2024 1925   ACIDBASEDEF 3.8 (H) 01/06/2024 1925   O2SAT 76 01/06/2024 1925     Coagulation Profile: Recent Labs  Lab 01/05/24 1839  INR 1.4*    Cardiac Enzymes: No results for input(s): "CKTOTAL", "CKMB",  "CKMBINDEX", "TROPONINI" in the last 168 hours.  HbA1C: Hgb A1c MFr Bld  Date/Time Value Ref Range Status  01/05/2024 10:56 PM 7.2 (H) 4.8 - 5.6 % Final    Comment:    (NOTE)         Prediabetes: 5.7 - 6.4         Diabetes: >6.4         Glycemic control for adults with diabetes: <7.0   09/23/2020 07:10 AM 8.6 (H) 4.8 - 5.6 % Final    Comment:    (NOTE) Pre diabetes:          5.7%-6.4%  Diabetes:              >6.4%  Glycemic control for   <7.0% adults with diabetes     CBG: Recent Labs  Lab 01/10/24 1555 01/10/24 1922 01/10/24 2327 01/11/24 0335 01/11/24 0747  GLUCAP 104* 120* 159* 168* 197*    Review of Systems:   Unable to assess pt mechanically intubated/sedation/critically ill  Past Medical History:  She,  has a past medical history of Anemia, Anxiety, Arthritis, CHF (congestive heart failure) (HCC), COPD (chronic obstructive pulmonary disease) (HCC), Coronary artery disease, Diabetes mellitus without complication (HCC), Fibromyalgia, GERD (gastroesophageal reflux disease), Hypertension, Peripheral vascular disease (HCC), Sleep apnea, and Stroke (HCC).   Surgical History:   Past Surgical History:  Procedure Laterality Date   CESAREAN SECTION     COLONOSCOPY WITH PROPOFOL N/A 08/02/2022   Procedure: COLONOSCOPY WITH PROPOFOL;  Surgeon: Midge Minium, MD;  Location: [facility] ENDOSCOPY;  Service: Endoscopy;  Laterality: N/A;   CORONARY ANGIOPLASTY WITH STENT PLACEMENT Left    DILATION AND CURETTAGE OF UTERUS     ENDARTERECTOMY Left 01/29/2015   Procedure: ENDARTERECTOMY CAROTID;  Surgeon: Annice Needy, MD;  Location: [facility] ORS;  Service: Vascular;  Laterality: Left;   LOWER EXTREMITY ANGIOGRAPHY Left 09/24/2020   Procedure: Lower Extremity Angiography;  Surgeon: Annice Needy, MD;  Location: [facility] INVASIVE CV LAB;  Service: Cardiovascular;  Laterality: Left;   PERIPHERAL VASCULAR CATHETERIZATION Right 05/27/2015  Procedure: Lower Extremity Angiography;  Surgeon: Celso College, MD;  Location: [facility] INVASIVE CV LAB;  Service: Cardiovascular;  Laterality: Right;   PERIPHERAL VASCULAR CATHETERIZATION  05/27/2015   Procedure: Lower Extremity Intervention;  Surgeon: Celso College, MD;  Location: [facility] INVASIVE CV LAB;  Service: Cardiovascular;;   PERIPHERAL VASCULAR CATHETERIZATION Right 09/22/2016   Procedure: Lower Extremity Angiography;  Surgeon: Celso College, MD;  Location: [facility] INVASIVE CV LAB;  Service: Cardiovascular;  Laterality: Right;   TUBAL LIGATION       Social History:   reports that she quit smoking about 7 years ago. Her smoking use included cigarettes. She started smoking about 45 years ago. She has a 9.5 pack-year smoking history. She uses smokeless tobacco. She reports that she does not drink alcohol and does not use drugs.   Family History:  Her family history is not on file.   Allergies Allergies  Allergen Reactions   Niacin And Related Nausea And Vomiting     Home Medications  Prior to Admission medications   Medication Sig Start Date End Date Taking? Authorizing Provider  atorvastatin (LIPITOR) 40 MG tablet Take 40 mg by mouth every evening.   Yes [provider]  buPROPion (WELLBUTRIN XL) 300 MG 24 hr tablet Take 300 mg by mouth daily. Pt taking 300 mg by mouth daily   Yes [provider]  cetirizine (ZYRTEC) 10 MG tablet Take 10 mg by mouth daily.   Yes [provider]  DULoxetine (CYMBALTA) 60 MG capsule Take 60 mg by mouth daily.   Yes [provider]  enalapril (VASOTEC) 5 MG tablet Take 5 mg by mouth daily.   Yes [provider]  HUMULIN R U-500 KWIKPEN 500 UNIT/ML kwikpen Inject 40-80 Units into the skin See admin instructions. Inject 80u under the skin every morning before breakfast and inject 40u under the skin every evening before supper   Yes [provider]  metFORMIN (GLUCOPHAGE-XR) 500 MG 24 hr tablet Take 500 mg by mouth 2 (two) times daily with a meal.   Yes [provider]  OLANZapine (ZYPREXA) 5 MG tablet Take 5 mg by mouth daily. 02/11/21  Yes [provider]  oxybutynin (DITROPAN-XL) 10 MG 24 hr tablet Take 10 mg by mouth daily.   Yes [provider]  pantoprazole (PROTONIX) 40 MG tablet Take 40 mg by mouth daily.   Yes [provider]  traZODone (DESYREL) 50 MG tablet Take 100 mg by mouth at bedtime.   Yes [provider]  collagenase (SANTYL) ointment Apply topically daily. Patient not taking: Reported on 01/05/2024 09/26/20   Alphonsus Jeans, MD  Dulaglutide (TRULICITY) 0.75 MG/0.5ML SOPN Inject 0.75 mg into the skin every Tuesday. Patient not taking: Reported on 06/24/2022    [provider]  gabapentin (NEURONTIN) 300 MG capsule Take 300-600 mg by mouth at bedtime. Patient not taking: Reported on 01/05/2024    [provider]     Critical care time: 35 minutes     Janey Meek, AGNP  Pulmonary/Critical Care Pager [phone] (please enter 7 digits) PCCM Consult Pager [phone] (please enter 7 digits)

## 2024-01-11 NOTE — Progress Notes (Signed)
   01/11/24 1115  Spiritual Encounters  Type of Visit Follow up  Care provided to: Family (Daughter in ICU waiting room)  Referral source Chaplain assessment  Reason for visit Routine spiritual support  OnCall Visit No  Spiritual Framework  Presenting Themes Meaning/purpose/sources of inspiration;Coping tools;Impactful experiences and emotions;Courage hope and growth;Other (comment) (for Daughter)  Interventions  Spiritual Care Interventions Made Compassionate presence;Reflective listening;Normalization of emotions;Decision-making support/facilitation;Explored values/beliefs/practices/strengths;Meaning making;Encouragement;Supported grief process;Other (comment) (for Daughter)  Intervention Outcomes  Outcomes Connection to spiritual care;Awareness around self/spiritual resourses;Awareness of support;Other (comment) (for Daughter)

## 2024-01-11 NOTE — TOC Progression Note (Signed)
 Transition of Care [facility]) - Progression Note    Patient Details  Name: [name] MRN: [id number] Date of Birth: 16-Aug-1961  [facility]) CM/SW Contact  Mazi Brailsford A Landon Truax, RN Phone Number: 01/11/2024, 3:41 PM  Clinical Narrative:    Chart reviewed.  Noted that family has decided to make patient a DNR and will withdrawal care on 01/12/2024.  TOC will continue to follow for support.          Expected Discharge Plan and Services                                               Social Determinants of Health (SDOH) Interventions SDOH Screenings   Food Insecurity: Patient Unable To Answer (01/06/2024)  Housing: Patient Unable To Answer (01/06/2024)  Transportation Needs: Patient Unable To Answer (01/06/2024)  Utilities: Patient Unable To Answer (01/06/2024)  Tobacco Use: High Risk (01/05/2024)    Readmission Risk Interventions     No data to display

## 2024-01-12 DIAGNOSIS — I469 Cardiac arrest, cause unspecified: Secondary | ICD-10-CM | POA: Diagnosis not present

## 2024-01-12 DIAGNOSIS — G9341 Metabolic encephalopathy: Secondary | ICD-10-CM | POA: Diagnosis not present

## 2024-01-12 DIAGNOSIS — I5041 Acute combined systolic (congestive) and diastolic (congestive) heart failure: Secondary | ICD-10-CM | POA: Diagnosis not present

## 2024-01-12 DIAGNOSIS — I4891 Unspecified atrial fibrillation: Secondary | ICD-10-CM | POA: Diagnosis not present

## 2024-01-12 LAB — GLUCOSE, CAPILLARY
Glucose-Capillary: 205 mg/dL — ABNORMAL HIGH (ref 70–99)
Glucose-Capillary: 128 mg/dL — ABNORMAL HIGH (ref 70–99)
Glucose-Capillary: 68 mg/dL — ABNORMAL LOW (ref 70–99)
Glucose-Capillary: 106 mg/dL — ABNORMAL HIGH (ref 70–99)

## 2024-01-12 LAB — HEPARIN LEVEL (UNFRACTIONATED): Heparin Unfractionated: 0.49 [IU]/mL (ref 0.30–0.70)

## 2024-01-12 LAB — BASIC METABOLIC PANEL WITH GFR
Anion gap: 10 (ref 5–15)
BUN: 59 mg/dL — ABNORMAL HIGH (ref 8–23)
CO2: 32 mmol/L (ref 22–32)
Calcium: 8 mg/dL — ABNORMAL LOW (ref 8.9–10.3)
Chloride: 97 mmol/L — ABNORMAL LOW (ref 98–111)
Creatinine, Ser: 1.35 mg/dL — ABNORMAL HIGH (ref 0.44–1.00)
GFR, Estimated: 44 mL/min — ABNORMAL LOW (ref >60)
Glucose, Bld: 182 mg/dL — ABNORMAL HIGH (ref 70–99)
Potassium: 3.5 mmol/L (ref 3.5–5.1)
Sodium: 139 mmol/L (ref 135–145)

## 2024-01-12 LAB — CBC
HCT: 33 % — ABNORMAL LOW (ref 36.0–46.0)
Hemoglobin: 9.1 g/dL — ABNORMAL LOW (ref 12.0–15.0)
MCH: 21.1 pg — ABNORMAL LOW (ref 26.0–34.0)
MCHC: 27.6 g/dL — ABNORMAL LOW (ref 30.0–36.0)
MCV: 76.4 fL — ABNORMAL LOW (ref 80.0–100.0)
Platelets: 238 10*3/uL (ref 150–400)
RBC: 4.32 MIL/uL (ref 3.87–5.11)
RDW: 17.6 % — ABNORMAL HIGH (ref 11.5–15.5)
WBC: 11.3 10*3/uL — ABNORMAL HIGH (ref 4.0–10.5)
nRBC: 0.8 % — ABNORMAL HIGH (ref 0.0–0.2)

## 2024-01-12 LAB — PHOSPHORUS: Phosphorus: 2.8 mg/dL (ref 2.5–4.6)

## 2024-01-12 LAB — MAGNESIUM: Magnesium: 2.3 mg/dL (ref 1.7–2.4)

## 2024-01-12 MED ORDER — ONDANSETRON HCL 4 MG/2ML IJ SOLN: 4.0000 mg | Freq: Four times a day (QID) | INTRAMUSCULAR | Status: DC | PRN

## 2024-01-12 MED ORDER — POLYVINYL ALCOHOL 1.4 % OP SOLN
1.0000 [drp] | Freq: Four times a day (QID) | OPHTHALMIC | Status: DC | PRN
  Filled 2024-01-12: qty 15

## 2024-01-12 MED ORDER — MORPHINE SULFATE (PF) 2 MG/ML IV SOLN
2.0000 mg | INTRAVENOUS | Status: DC | PRN
  Administered 2024-01-12 (×2): 4 mg via INTRAVENOUS
  Filled 2024-01-12 (×2): qty 2

## 2024-01-12 MED ORDER — GLYCOPYRROLATE 0.2 MG/ML IJ SOLN: 0.2000 mg | INTRAMUSCULAR | Status: DC | PRN

## 2024-01-12 MED ORDER — SODIUM CHLORIDE 0.9 % IV SOLN: INTRAVENOUS | Status: DC

## 2024-01-12 MED ORDER — ONDANSETRON 4 MG PO TBDP
4.0000 mg | ORAL_TABLET | Freq: Four times a day (QID) | ORAL | Status: DC | PRN
  Filled 2024-01-12: qty 1

## 2024-01-12 MED ORDER — LORAZEPAM 2 MG/ML IJ SOLN: 2.0000 mg | INTRAMUSCULAR | Status: DC | PRN

## 2024-01-12 MED ORDER — DIPHENHYDRAMINE HCL 50 MG/ML IJ SOLN: 25.0000 mg | INTRAMUSCULAR | Status: DC | PRN

## 2024-01-12 MED ORDER — SODIUM CHLORIDE 0.9% FLUSH: 3.0000 mL | INTRAVENOUS | Status: DC | PRN

## 2024-01-12 MED ORDER — HYDROMORPHONE BOLUS VIA INFUSION: 1.0000 mg | INTRAVENOUS | Status: DC | PRN

## 2024-01-12 MED ORDER — ACETAMINOPHEN 325 MG PO TABS: 650.0000 mg | ORAL_TABLET | Freq: Four times a day (QID) | ORAL | Status: DC | PRN

## 2024-01-12 MED ORDER — SODIUM CHLORIDE 0.9% FLUSH: 3.0000 mL | Freq: Two times a day (BID) | INTRAVENOUS | Status: DC

## 2024-01-12 MED ORDER — MIDAZOLAM HCL 2 MG/2ML IJ SOLN
4.0000 mg | Freq: Once | INTRAMUSCULAR | Status: AC
  Administered 2024-01-12: 4 mg via INTRAVENOUS
  Filled 2024-01-12: qty 4

## 2024-01-12 MED ORDER — HYDROMORPHONE HCL-NACL 50-0.9 MG/50ML-% IV SOLN
2.0000 mg/h | INTRAVENOUS | Status: DC
  Administered 2024-01-12: 2 mg/h via INTRAVENOUS
  Filled 2024-01-12: qty 50

## 2024-01-12 MED ORDER — ACETAMINOPHEN 650 MG RE SUPP: 650.0000 mg | Freq: Four times a day (QID) | RECTAL | Status: DC | PRN

## 2024-01-12 MED ORDER — GLYCOPYRROLATE 1 MG PO TABS: 1.0000 mg | ORAL_TABLET | ORAL | Status: DC | PRN

## 2024-01-12 MED ORDER — HALOPERIDOL LACTATE 5 MG/ML IJ SOLN: 2.5000 mg | INTRAMUSCULAR | Status: DC | PRN

## 2024-01-25 NOTE — Progress Notes (Signed)
   2024/01/13 1300  Spiritual Encounters  Type of Visit Follow up  Care provided to: Pt and family  Referral source Family  Reason for visit End-of-life  OnCall Visit No  Spiritual Framework  Presenting Themes Meaning/purpose/sources of inspiration;Values and beliefs;Coping tools;Other (comment) (Family)  Interventions  Spiritual Care Interventions Made Compassionate presence;Reflective listening;Meaning making;Supported grief process;Other (comment) (for Family)  Intervention Outcomes  Outcomes Connection to spiritual care;Awareness of support;Other (comment) (for Family)

## 2024-01-25 NOTE — IPAL (Addendum)
  Interdisciplinary Goals of Care Family Meeting   Date carried out: January 13, 2024  Location of the meeting: Bedside  Member's involved: Nurse Practitioner, Chaplain, and Family Member or next of kin  Durable Power of Attorney or acting medical decision maker: pt's Daughter [name], pt's spouse [name] pt's niece [name]  Discussion: We discussed goals of care for [name] .  Followed up on goals of care converstation from yesterday.  Discussed that Neuro exam has remained unchanged, and that neuro exam along with MRI Brain indicative of severe anoxic brain injury, and that chances for meaningful recovery are exceedingly slim/poor.  They would like to withdraw all life sustaining measures and proceed with transition to COMFORT MEASURES ONLY.  DNR/DNI status.  Discussed that timing of death is unknown (it could be minutes, to hours, to days), however our focus has shifted to ensure that she is comfortable and does not suffer during the dying process.  Code status:   Code Status: Limited: Do not attempt resuscitation (DNR) -DNR-LIMITED -Do Not Intubate/DNI    Disposition: In-patient comfort care  Time spent for the meeting: 15 minutes     Cherylann Corpus, AGACNP-BC [facility] Prefer epic messenger for cross cover needs If after hours, please call E-link  Delanna Fears, NP  January 13, 2024, 1:16 PM

## 2024-01-25 NOTE — Progress Notes (Signed)
 Patient passed away at 2100 01/24/2024 ,family at bedside , notified attending Dr Vallarie Gauze

## 2024-01-25 NOTE — Progress Notes (Signed)
 Patient noted to be absent of heart rate and respirations.  Pronounced dead at 2099/02/20 2024/02/10.  Family at bedside.  Dr Vallarie Gauze notified by secure chat by patient nurse.

## 2024-01-25 NOTE — Progress Notes (Signed)
   2024/01/25 1515  Spiritual Encounters  Type of Visit Follow up  Care provided to: Pt and family  Conversation partners present during encounter Nurse  Referral source Chaplain assessment  Reason for visit End-of-life (Family and Friends in room and waiting room; Pt. moved to comfort measures.)  OnCall Visit No  Spiritual Framework  Presenting Themes Meaning/purpose/sources of inspiration;Values and beliefs;Coping tools;Impactful experiences and emotions;Courage hope and growth;Other (comment) (For Family)  Interventions  Spiritual Care Interventions Made Established relationship of care and support;Compassionate presence;Reflective listening;Normalization of emotions;Explored values/beliefs/practices/strengths;Meaning making;Supported grief process;Other (comment) (for Family and Friends)  Intervention Outcomes  Outcomes Connection to spiritual care;Awareness around self/spiritual resourses;Awareness of support;Reduced anxiety;Reduced fear;Other (comment) (for Family and Friends)

## 2024-01-25 NOTE — Progress Notes (Signed)
 NAME:  [name], MRN:  [id number], DOB:  October 20, 1960, LOS: 7 ADMISSION DATE:  01/05/2024, CONSULTATION DATE: 01/06/2024 REFERRING MD: Dr. Meyer Ada, CHIEF COMPLAINT: Cardiac Arrest    Brief Pt Description / Synopsis:  63 y.o. female admitted with new onset A.fib with RVR, NSTEMI, and decompensated HFrEF.  On 01/06/24, Hospital course complicated by bradycardia with progression to Asystole Cardiac Arrest (ROSC after 5 minutes).  Post arrest with development of eyelid myoclonus and concern for anoxic brain injury.   History of Present Illness:  This is a 63 yo female who presented to [facility] ER on 04/11 via EMS from home with c/o weakness and shortness of breath onset 3 days prior to presentation.   At baseline she wears CPAP at night, but is not on chronic O2.  Per EMS pt found to be in atrial fibrillation with rvr.  She does not have a hx of atrial fibrillation.  She was also hypoglycemic CBG 46, EMS administered glucagon.  However, she remained hypoglycemic and received 1 amp of D50.  CBG stabilized.    ED Course  Upon arrival to the ER initial EKG revealed atrial fibrillation with right bundle branch block, hr 139 but no acute ST elevation or depression present.  She received 1L iv fluid bolus.  Significant lab results were: glucose 58/BUN 27/creatinine 1.21/albumin 3.1/BNP 1,263.5/troponin 1,367/wbc 11.9/hgb 9.7/platelet count 450.  CTA Chest negative for PE, but concerning for interstitial edema.  She received 5 mg of iv metoprolol  x2 with no significant improvement in heart rate.  Heparin  bolus followed by heparin  gtt administered for possible NSTEMI.  She was subsequently admitted to the progressive care unit per hospitalist team for additional workup and treatment, however she remained in the ER pending bed availability.  See detailed hospital course below under significant events.   CTA Chest PE:  No evidence acute pulmonary embolism. RIGHT pleural effusion with RIGHT basilar atelectasis. Mild  interstitial thickening at the lung bases suggest interstitial edema. Ill-defined ground-glass densities in the upper lobes. Findings suggest mild pulmonary edema. Benign LEFT adrenal adenoma.  CXR: Detail limited exam without definite plain film evidence of acute process. Enlarged heart.  Pertinent  Medical History  Anemia  Anxiety  Arthritis  COPD CAD Type II Diabetes Mellitus  Fibromyalgia  GERD  HTN  PVD OSA Stroke  CHF   Micro Data:  4/11: SARS-CoV-2/flu/RSV PCR>>negative 04/11: HIV screen>>non reactive 4/12: MRSA PCR>>negative 4/12: Blood culture x 2>> no growth   Antimicrobials:   Anti-infectives (From admission, onward)    Start     Dose/Rate Route Frequency Ordered Stop   01/10/24 1000  cefTRIAXone  (ROCEPHIN ) 2 g in sodium chloride  0.9 % 100 mL IVPB        2 g 200 mL/hr over 30 Minutes Intravenous Every 24 hours 01/09/24 1028 01/11/24 1015   01/07/24 1100  piperacillin -tazobactam (ZOSYN ) IVPB 3.375 g        3.375 g 12.5 mL/hr over 240 Minutes Intravenous Every 8 hours 01/07/24 1001 01/09/24 2359      Significant Hospital Events: Including procedures, antibiotic start and stop dates in addition to other pertinent events   04/11: Pt admitted to the progressive care unit with new onset atrial fibrillation with rvr, acute respiratory failure secondary to acute CHF exacerbation, and NSTEMI.   04/12: Pt remained in atrial fibrillation with rvr amiodarone  bolus administered followed by amiodarone  gtt at 60 mg/hr.  Following administration she became bradycardic and pulseless ACLS protocol initiated with ROSC 13 minutes following initiation  of ACLS protocol.  Pt mechanically intubated during cardiac arrest.  Post cardiac arrest pt comatose, unable to follow commands, with no gag/oculocephalic reflexes present despite NOT receiving RSI medications and intermittent eyelid myoclonus present neurology consulted and cerebell initiated 04/13: Pt with worsening myoclonus,  cerebell remains in place and noted to have myoclonic seizures every 1-2 minutes suggestive of anoxic/hypoxic brain injury.  Propofol  gtt ordered for suppression.  04/14: Plan for repeat EEG today.  On Neuro exam, currently off propofol , no myoclonus noted.  Does have + brainstem reflexes, somewhat opening eyes to noxious stimulation.  Allowing time for neuro prognostication, plan for MRI Brain tomorrow. 04/15: Overnight/this morning developed A.flutter w/ RVR.  Afebrile, requiring low dose Neosynephrine. Currently sedated on Propofol , will wean for Neuro exam. Repeating EEG, and plan for MRI Brain today. 04/16: Pt remains mechanically intubated on minimal settings, however neuro exam remains the same and MRI Brain suggestive of diffuse hypoxic/anoxic brain injury.  Code status changed to DNR per family request  04/17: Family deciding if they would like to proceed with organ procurement.  Neuro exam remains the same.  Pt febrile overnight tmax 103.1 F requiring cooling blanket to maintain normothermia  2024/01/18: No significant events noted overnight.  Neuro exam unchanged.  Tentative plan to transition to comfort measures at 12:00 today.  Interim History / Subjective:  As outlined above under significant events   Objective   Blood pressure (!) 139/91, pulse 64, temperature 100.3 F (37.9 C), temperature source Bladder, resp. rate 20, height 5' 5.51" (1.664 m), weight 126.5 kg, SpO2 98%.    Vent Mode: PRVC FiO2 (%):  [28 %] 28 % Set Rate:  [20 bmp] 20 bmp Vt Set:  [450 mL] 450 mL PEEP:  [5 cmH20] 5 cmH20 Plateau Pressure:  [20 cmH20-22 cmH20] 20 cmH20   Intake/Output Summary (Last 24 hours) at January 18, 2024 1610 Last data filed at Jan 18, 2024 9604 Gross per 24 hour  Intake 2385.08 ml  Output 3236 ml  Net -850.92 ml   Filed Weights   01/09/24 0500 01/10/24 0500 01/11/24 0500  Weight: 128.8 kg 126.4 kg 126.5 kg   Examination: General: Acutely-ill appearing obese female, NAD mechanically  intubated  HENT: Supple, unable to assess for JVD due to body habitus, orally intubated Lungs: Faint rhonchi throughout, even, non labored, overbreathing the vent Cardiovascular: NSR, s1s2, no m/r/g, 2+ radial/1+ left distal pulse, 1+ right femoral pulse, no edema  Abdomen: +BS x4, obese, soft, non distended  Extremities: Right BKA  Neuro: On low dose propofol , not following commands or withdrawing from stimulation, + cough/gag/corneal reflexes, bilateral pupils very sluggish, no myoclonus GU: Indwelling foley catheter present draining yellow urine   Resolved Hospital Problem list     Assessment & Plan:   #Acute metabolic encephalopathy  #Myoclonic seizures post cardiac arrest~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 Severe chronic small vessel ischemic disease EEG 4/14 negative for myoclonus  MRI Brain 04/15: Cerebral and cerebellar abnormal signal is described likely reflecting sequelae of hypoxic/ischemic injury in the setting of reported cardiac arrest - Treatment of metabolic derangements as outlined below - Maintain RASS goal of 0  - Propofol  as needed for suppression of myoclonus per neuro recommendations  - Avoid sedating medications as able  - Continue scheduled keppra  1g q12rs for myoclonus  - Neurology following, appreciate input - Seizure precautions  - Normothermia protocol completed  - WUA daily   #Cardiac arrest, suspect secondary to amiodarone  (  asystole) #NSTEMI  #New-onset atrial fibrillation with rvr / A-flutter w/ RVR #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  Hx: HTN, PVD, stroke, and CAD  Echo 01/06/2024: EF 35 to 40%; grade II diastolic dysfunction  - Continuous telemetry monitoring  - Continue scheduled metoprolol  for rate control  - Continue heparin  gtt: dosing per pharmacy  - Diurese as bp and renal function permits  - Hold antihypertensives for now  - Maintain map  65 or higher  - Continue aspirin  and atorvastatin   - Cardiology following, appreciate input  - Troponin peaked at 1,827 - TSH normal: 2.6  #Acute hypoxic respiratory failure  #Mechanical intubation  Hx: COPD and OSA  - Full vent support, implement lung protective strategies - Plateau pressures less than 30 cm H20 - Wean FiO2 & PEEP as tolerated to maintain O2 sats 88 to 92% - Follow intermittent Chest X-ray & ABG as needed -Spontaneous Breathing Trials when respiratory parameters met and mental status permits ~ MENTAL STATUS CURRENTLY PRECLUDING EXTUBATION - Implement VAP Bundle - Prn Bronchodilators - Diuresis as BP and renal function permits ~ currently on 20 mg IV Lasix  BID - ABX as above  #Acute kidney injury secondary to ATN ~ improving #Severe metabolic acidosis ~ resolved #Lactic acidosis  #Mild Hypokalemia - Monitor I&O's / urinary output - Follow BMP - Ensure adequate renal perfusion - Avoid nephrotoxic agents as able - Replace electrolytes as indicated ~ Pharmacy following for assistance with electrolyte replacement  #Transamanitis  - Trend hepatic function panel  - Avoid hepatotoxic agents as able   #Anemia without obvious signs of bleeding  - Trend CBC  - Monitor for s/sx of bleeding  - Transfuse for hgb <7   #Type II diabetes mellitus  #Hypoglycemia~resolved  - CBG's q4hrs  - SSI  - Follow hypo/hyperglycemic protocol  - Target CBG range 140 to 180  Pt with worsening myoclonus cerebell showing myoclonic seizures every 1-2 minutes suggestive of anoxic/hypoxic brain injury.  Pt with poor prognosis, code status changed to DNR.   Best Practice (right click and "Reselect all SmartList Selections" daily)   Diet/type: NPO; TF's DVT prophylaxis systemic heparin  Pressure ulcer(s): N/A GI prophylaxis: PPI Lines: N/A Foley:  Yes, and it is still needed Code Status:  full code Last date of multidisciplinary goals of care discussion [01/18/2024]  January 18, 2024: Will  update pts family when they arrive at bedside   Labs   CBC: Recent Labs  Lab 01/05/24 1547 01/06/24 0431 01/08/24 0412 01/09/24 0539 01/10/24 0447 01/11/24 0423 January 18, 2024 0520  WBC 11.9*   < > 26.2* 21.2* 22.1* 21.2* 11.3*  NEUTROABS 7.9*  --   --   --   --   --   --   HGB 9.7*   < > 9.3* 9.4* 9.8* 9.4* 9.1*  HCT 35.5*   < > 33.7* 35.1* 36.4 34.6* 33.0*  MCV 77.9*   < > 74.9* 78.0* 77.4* 77.1* 76.4*  PLT 450*   < > 317 366 363 281 238   < > = values in this interval not displayed.    Basic Metabolic Panel: Recent Labs  Lab 01/08/24 0412 01/09/24 0539 01/10/24 0447 01/11/24 0423 18-Jan-2024 0520  NA 140 140 138 141 139  K 3.4* 3.9 4.3 4.1 3.5  CL 98 98 96* 99 97*  CO2 31 31 30 31  32  GLUCOSE 229* 253* 150* 158* 182*  BUN 45* 42* 52* 62* 59*  CREATININE 1.58* 1.17* 1.20* 1.43* 1.35*  CALCIUM  7.9* 7.7* 7.9*  8.0* 8.0*  MG 2.3 2.2 2.4 2.5* 2.3  PHOS 3.7 2.7 3.2 3.5 2.8   GFR: Estimated Creatinine Clearance: 57.6 mL/min (A) (by C-G formula based on SCr of 1.35 mg/dL (H)). Recent Labs  Lab 01/06/24 1658 01/07/24 0258 01/07/24 0516 01/08/24 0412 01/09/24 0539 01/10/24 0447 01/11/24 0423 Feb 08, 2024 0520  WBC  --    < > 25.5*   < > 21.2* 22.1* 21.2* 11.3*  LATICACIDVEN 4.9*  --  2.7*  --   --   --   --   --    < > = values in this interval not displayed.    Liver Function Tests: Recent Labs  Lab 01/05/24 1547 01/06/24 1549 01/07/24 0258 01/07/24 0516 01/09/24 0539  AST 24 101*  --  90* 75*  ALT 26 84*  --  110* 150*  ALKPHOS 95 99  --  101 94  BILITOT 0.6 1.0  --  0.9 1.0  PROT 7.2 6.2*  --  6.4* 5.6*  ALBUMIN 3.1* 2.7* 2.9* 2.8* 2.3*   Recent Labs  Lab 01/05/24 1547  LIPASE 21   No results for input(s): "AMMONIA" in the last 168 hours.  ABG    Component Value Date/Time   PHART 7.04 (LL) 01/06/2024 1354   PCO2ART 62 (H) 01/06/2024 1354   PO2ART 116 (H) 01/06/2024 1354   HCO3 24.4 01/06/2024 1925   ACIDBASEDEF 3.8 (H) 01/06/2024 1925   O2SAT 76  01/06/2024 1925     Coagulation Profile: Recent Labs  Lab 01/05/24 1839  INR 1.4*    Cardiac Enzymes: No results for input(s): "CKTOTAL", "CKMB", "CKMBINDEX", "TROPONINI" in the last 168 hours.  HbA1C: Hgb A1c MFr Bld  Date/Time Value Ref Range Status  01/05/2024 10:56 PM 7.2 (H) 4.8 - 5.6 % Final    Comment:    (NOTE)         Prediabetes: 5.7 - 6.4         Diabetes: >6.4         Glycemic control for adults with diabetes: <7.0   09/23/2020 07:10 AM 8.6 (H) 4.8 - 5.6 % Final    Comment:    (NOTE) Pre diabetes:          5.7%-6.4%  Diabetes:              >6.4%  Glycemic control for   <7.0% adults with diabetes     CBG: Recent Labs  Lab 01/11/24 1617 01/11/24 1927 01/11/24 2342 Feb 08, 2024 0340 02/08/2024 0735  GLUCAP 146* 159* 201* 205* 128*    Review of Systems:   Unable to assess pt mechanically intubated/sedation/critically ill  Past Medical History:  She,  has a past medical history of Anemia, Anxiety, Arthritis, CHF (congestive heart failure) (HCC), COPD (chronic obstructive pulmonary disease) (HCC), Coronary artery disease, Diabetes mellitus without complication (HCC), Fibromyalgia, GERD (gastroesophageal reflux disease), Hypertension, Peripheral vascular disease (HCC), Sleep apnea, and Stroke (HCC).   Surgical History:   Past Surgical History:  Procedure Laterality Date   CESAREAN SECTION     COLONOSCOPY WITH PROPOFOL  N/A 08/02/2022   Procedure: COLONOSCOPY WITH PROPOFOL ;  Surgeon: Marnee Sink, MD;  Location: [facility] ENDOSCOPY;  Service: Endoscopy;  Laterality: N/A;   CORONARY ANGIOPLASTY WITH STENT PLACEMENT Left    DILATION AND CURETTAGE OF UTERUS     ENDARTERECTOMY Left 01/29/2015   Procedure: ENDARTERECTOMY CAROTID;  Surgeon: Celso College, MD;  Location: [facility] ORS;  Service: Vascular;  Laterality: Left;   LOWER EXTREMITY ANGIOGRAPHY Left 09/24/2020  Procedure: Lower Extremity Angiography;  Surgeon: Celso College, MD;  Location: [facility] INVASIVE CV LAB;   Service: Cardiovascular;  Laterality: Left;   PERIPHERAL VASCULAR CATHETERIZATION Right 05/27/2015   Procedure: Lower Extremity Angiography;  Surgeon: Celso College, MD;  Location: [facility] INVASIVE CV LAB;  Service: Cardiovascular;  Laterality: Right;   PERIPHERAL VASCULAR CATHETERIZATION  05/27/2015   Procedure: Lower Extremity Intervention;  Surgeon: Celso College, MD;  Location: [facility] INVASIVE CV LAB;  Service: Cardiovascular;;   PERIPHERAL VASCULAR CATHETERIZATION Right 09/22/2016   Procedure: Lower Extremity Angiography;  Surgeon: Celso College, MD;  Location: [facility] INVASIVE CV LAB;  Service: Cardiovascular;  Laterality: Right;   TUBAL LIGATION       Social History:   reports that she quit smoking about 7 years ago. Her smoking use included cigarettes. She started smoking about 45 years ago. She has a 9.5 pack-year smoking history. She uses smokeless tobacco. She reports that she does not drink alcohol  and does not use drugs.   Family History:  Her family history is not on file.   Allergies Allergies  Allergen Reactions   Niacin And Related Nausea And Vomiting     Home Medications  Prior to Admission medications   Medication Sig Start Date End Date Taking? Authorizing Provider  atorvastatin  (LIPITOR) 40 MG tablet Take 40 mg by mouth every evening.   Yes [provider]  buPROPion  (WELLBUTRIN  XL) 300 MG 24 hr tablet Take 300 mg by mouth daily. Pt taking 300 mg by mouth daily   Yes [provider]  cetirizine (ZYRTEC) 10 MG tablet Take 10 mg by mouth daily.   Yes [provider]  DULoxetine  (CYMBALTA ) 60 MG capsule Take 60 mg by mouth daily.   Yes [provider]  enalapril  (VASOTEC ) 5 MG tablet Take 5 mg by mouth daily.   Yes [provider]  HUMULIN R  U-500 KWIKPEN 500 UNIT/ML kwikpen Inject 40-80 Units into the skin See admin instructions. Inject 80u under the skin every morning before breakfast and inject 40u under the skin every evening before  supper   Yes [provider]  metFORMIN (GLUCOPHAGE-XR) 500 MG 24 hr tablet Take 500 mg by mouth 2 (two) times daily with a meal.   Yes [provider]  OLANZapine  (ZYPREXA ) 5 MG tablet Take 5 mg by mouth daily. 02/11/21  Yes [provider]  oxybutynin  (DITROPAN -XL) 10 MG 24 hr tablet Take 10 mg by mouth daily.   Yes [provider]  pantoprazole  (PROTONIX ) 40 MG tablet Take 40 mg by mouth daily.   Yes [provider]  traZODone  (DESYREL ) 50 MG tablet Take 100 mg by mouth at bedtime.   Yes [provider]  collagenase  (SANTYL ) ointment Apply topically daily. Patient not taking: Reported on 01/05/2024 09/26/20   Alphonsus Jeans, MD  Dulaglutide (TRULICITY) 0.75 MG/0.5ML SOPN Inject 0.75 mg into the skin every Tuesday. Patient not taking: Reported on 06/24/2022    [provider]  gabapentin  (NEURONTIN ) 300 MG capsule Take 300-600 mg by mouth at bedtime. Patient not taking: Reported on 01/05/2024    [provider]     Critical care time: 40 minutes     Cherylann Corpus, AGACNP-BC [facility] Prefer epic messenger for cross cover needs If after hours, please call E-link

## 2024-01-25 NOTE — Progress Notes (Signed)
 Nutrition Brief Note  Chart reviewed. Case discussed with RN, MD, and during ICU rounds. Plan for comfort care today.  No further nutrition interventions planned at this time.  Please re-consult as needed.   Herschel Lords, RD, LDN, CDCES Registered Dietitian III Certified Diabetes Care and Education Specialist If unable to reach this RD, please use "RD Inpatient" group chat on secure chat between hours of 8am-4 pm daily

## 2024-01-25 NOTE — Consult Note (Signed)
 PHARMACY - ANTICOAGULATION CONSULT NOTE  Pharmacy Consult for IV Heparin  Indication: chest pain/ACS  Patient Measurements: Height: 5' 5.51" (166.4 cm) Weight: 126.5 kg (278 lb 14.1 oz) IBW/kg (Calculated) : 58.18 HEPARIN  DW (KG): 88  Labs: Recent Labs    01/09/24 0539 01/10/24 0447 01/11/24 0423 01/11/24 0616 01/11/24 1544 January 20, 2024 0041  HGB 9.4* 9.8* 9.4*  --   --   --   HCT 35.1* 36.4 34.6*  --   --   --   PLT 366 363 281  --   --   --   HEPARINUNFRC 0.41 0.31  --  0.72* 0.77* 0.49  CREATININE 1.17* 1.20* 1.43*  --   --   --    Estimated Creatinine Clearance: 54.4 mL/min (A) (by C-G formula based on SCr of 1.43 mg/dL (H)).  Medical History: Past Medical History:  Diagnosis Date   Anemia    Anxiety    Arthritis    CHF (congestive heart failure) (HCC)    COPD (chronic obstructive pulmonary disease) (HCC)    Coronary artery disease    Diabetes mellitus without complication (HCC)    Fibromyalgia    GERD (gastroesophageal reflux disease)    Hypertension    Peripheral vascular disease (HCC)    Sleep apnea    Stroke (HCC)    Medications:  No anticoagulation prior to admission per my chart review  Assessment: 63 y/o F with medical history as above presenting to the ED 4/11 with weakness, SOB. Troponin elevated. Pharmacy consulted to manage heparin  infusion for ACS.  4/11:  HL @ 2256 = < 0.1, SUBtherapeutic 4/12 0854 HL 0.15   SUBtherapeutic  1400 >> 1650 4/12 2327 HL < 0.1  SUBtherapeutic  1650 units/hr  4/13 0516 HL 0.41   Therapeutic X 1  4/13 1106 HL 0.51   Therapeutic X 2  4/14 0412 HL 0.30   Therapeutic X 3 4/15 0539 HL 0.41   Therapeutic x 4 4/16 0447 HL 0.31    Therapeutic x 5 4/17 0423 HL 0.72    Supratherapeutic 4/17 1544 HL 0.77    Supratherapeutic  2024-01-20 0041 HL 0.49 Therapeutic x 1  Goal of Therapy:  Heparin  level 0.3-0.7 units/ml Monitor platelets by anticoagulation protocol: Yes   Plan:  -- Continue heparin  infusion at 1450 units/hr --  Family has decided to transition patient to comfort care measures tomorrow at 12 PM -- Will schedule recheck HL for conformation at 1200  -- Daily CBC per protocol while on IV heparin   Coretta Dexter, PharmD, [facility] January 20, 2024 3:02 AM

## 2024-01-25 NOTE — Death Summary Note (Signed)
 DEATH SUMMARY   Patient Details  Name: [name] MRN: [id number] DOB: Feb 24, 1961  Admission/Discharge Information   Admit Date:  Jan 24, 2024  Date of Death: Date of Death: 01/31/2024  Time of Death: Time of Death: Feb 10, 2099  Length of Stay: 7  Referring Physician: Ardia Kraft, NP   Reason(s) for Hospitalization  Shortness of breath  Diagnoses  Preliminary cause of death: PEA Cardiac arrest Secondary Diagnoses (including complications and co-morbidities):  Principal Problem:   Atrial fibrillation with rapid ventricular response (HCC) Active Problems:   Essential hypertension, benign   Obesity, Class III, BMI 40-49.9 (morbid obesity) (HCC)   PAD (peripheral artery disease) (HCC)   Uncontrolled type 2 diabetes mellitus with hypoglycemia, with long-term current use of insulin  (HCC)   NSTEMI (non-ST elevated myocardial infarction) (HCC)   CAD S/P percutaneous coronary angioplasty   OSA on CPAP   Hx of right BKA (HCC)   History of left-sided carotid endarterectomy   Acute CHF (congestive heart failure) (HCC)   Acute hypercapnic respiratory failure (HCC)   Cardiac arrest [facility])   Brief Hospital Course (including significant findings, care, treatment, and services provided and events leading to death)  [name] is a 63 y.o. year old female  who was admitted to [facility] service on 2024-01-24 with new onset A-fib with RVR, NSTEMI, acute decompensated heart failure, acute hypoxic respiratory failure requiring oxygen supplementation.  She was started on a heparin  drip as well as IV Cardizem  and IV furosemide  for CHF. On 04/12, Pt remained in atrial fibrillation with rvr amiodarone  bolus was administered followed by amiodarone  gtt at 60 mg/hr.  Following administration of Amiodarone  Nurse noted that she developed bradycardia with heart rate in the 30s then 20s then it went into cardiac arrest.  CODE BLUE was activated at 1323 and ACLS of the call was initiated by ED staff.  Initial rhythm was  asystole.  She received 4 doses of epi and achieved ROSC 1336.  She also received a dose of magnesium  2 g.  Post cardiac arrest pt remained comatose, unable to follow commands, with no gag/oculocephalic reflexes present despite NOT receiving RSI medications and intermittent eyelid myoclonus present. Neurology was consulted and cerebell initiated. Neurologist noted on exam after cardiac arrest off sedation with no RSI drugs on board  that patient was comatose and does not respond to commands, pupils were 2mm bilat and responsive to light, (+) corneals and cough, (-) oculocephalics and gag. No response to pain in any extremity. 04/13: Pt with worsening myoclonus, cerebell remains in place and noted to have myoclonic seizures every 1-2 minutes suggestive of anoxic/hypoxic brain injury.  Propofol  gtt ordered for suppression. 04/14: Plan for repeat EEG today.  On Neuro exam, currently off propofol , no myoclonus noted.  Does have + brainstem reflexes, somewhat opening eyes to noxious stimulation.  Allowing time for neuro prognostication, plan for MRI Brain tomorrow. 04/15: Overnight/this morning developed A.flutter w/ RVR.  Afebrile, requiring low dose Neosynephrine. Currently sedated on Propofol , will wean for Neuro exam. Repeating EEG, and plan for MRI Brain today. 04/16: Pt remains mechanically intubated on minimal settings, however neuro exam remains the same and MRI Brain suggestive of diffuse hypoxic/anoxic brain injury.  Code status changed to DNR per family request . 04/17: Family deciding if they would like to proceed with organ procurement.  Neuro exam remains the same.  Pt febrile overnight tmax 103.1 F requiring cooling blanket to maintain normothermia. 2024/01/31: Patient transitioned to full comfort care and was terminally extubated at  3 pm.Patient passed away shortly at 2099/02/20 10-Feb-2024 with family at bedside.  Pertinent Labs and Studies  Significant Diagnostic Studies MR BRAIN WO CONTRAST Result Date:  01/09/2024 CLINICAL DATA:  Mental status change, unknown cause; cardiac arrest April 12 EXAM: MRI HEAD WITHOUT CONTRAST TECHNIQUE: Multiplanar, multiecho pulse sequences of the brain and surrounding structures were obtained without intravenous contrast. COMPARISON:  November 2023 FINDINGS: Brain: There is relatively diffuse cortical mildly reduced diffusion. Basal ganglia also demonstrate increased diffusion and T2 signal bilaterally. There is somewhat patchy cerebellar mildly reduced diffusion. Prominence of the ventricles and sulci reflecting stable parenchymal volume loss. Other confluent areas of T2 hyperintensity in the supratentorial and pontine white matter probably reflects stable advanced chronic microvascular ischemic changes. Chronic small vessel infarcts of the central white matter bilaterally and left cerebellum. Vascular: Major vessel flow voids at the skull base are preserved. Skull and upper cervical spine: Normal marrow signal is preserved. Sinuses/Orbits: Paranasal sinuses are aerated. Orbits are unremarkable. Other: Sella is unremarkable. Patchy bilateral mastoid fluid opacification. IMPRESSION: Cerebral and cerebellar abnormal signal is described likely reflecting sequelae of hypoxic/ischemic injury in the setting of reported cardiac arrest. Electronically Signed   By: Geannie Keener M.D.   On: 01/09/2024 19:58   EEG adult Result Date: 01/09/2024 Arleene Lack, MD     01/09/2024  2:11 PM Patient Name: [name] MRN: [id number] Epilepsy Attending: Arleene Lack Referring Physician/Provider: Tona Francis, MD Date: 01/09/2024 Duration: 31.53 mins  Patient history:  63 yo F s/p cardiac arrest developed persistent eyelid myoclonus which has now resolved. EEG to evaluate for seizure  Level of alertness:  comatose  AEDs during EEG study: LEV, propofol   Technical aspects: This EEG study was done with scalp electrodes positioned according to the 10-20 International system of electrode  placement. Electrical activity was reviewed with band pass filter of 1-70Hz , sensitivity of 7 uV/mm, display speed of 69mm/sec with a 60Hz  notched filter applied as appropriate. EEG data were recorded continuously and digitally stored.  Video monitoring was available and reviewed as appropriate.  Description: EEG showed continuous generalized background suppression. Hyperventilation and photic stimulation were not performed.    ABNORMALITY - Background suppression, generalized  IMPRESSION: This study is suggestive of profound diffuse encephalopathy. No seizures or epileptiform discharges were seen throughout the recording.  Arleene Lack   EEG adult Result Date: 01/08/2024 Arleene Lack, MD     01/08/2024  1:05 PM Patient Name: [name] MRN: [id number] Epilepsy Attending: Arleene Lack Referring Physician/Provider: Domingo Friend, NP Date: 01/08/2024 Duration: 29.28 mins Patient history:  63 yo F s/p cardiac arrest developed persistent eyelid myoclonus which has now resolved. EEG to evaluate for seizure Level of alertness:  comatose AEDs during EEG study: LEV, propofol  Technical aspects: This EEG study was done with scalp electrodes positioned according to the 10-20 International system of electrode placement. Electrical activity was reviewed with band pass filter of 1-70Hz , sensitivity of 7 uV/mm, display speed of 58mm/sec with a 60Hz  notched filter applied as appropriate. EEG data were recorded continuously and digitally stored.  Video monitoring was available and reviewed as appropriate. Description: EEG showed continuous generalized and maximal bifrontal polymorphic sharply contoured 3 to 6 Hz theta-delta slowing. Hyperventilation and photic stimulation were not performed.   ABNORMALITY - Continuous slow, generalized and maximal bifrontal IMPRESSION: This study is suggestive of cortical dysfunction arising from bifrontal region likely secondary to underlying structural abnormality. Additionally  there is moderate to severe diffuse encephalopathy.  No seizures or epileptiform discharges were seen throughout the recording. Priyanka Suzanne Erps   Rapid EEG Result Date: 01/07/2024 Arleene Lack, MD     01/08/2024 10:11 AM Patient Name: [name] MRN: [id number] Epilepsy Attending: Arleene Lack Referring Physician/Provider: Domingo Friend, NP Duration: 01/06/2024 1552 to 01/07/2024 1128 Patient history: 63 yo F s/p cardiac arrest developed persistent eyelid myoclonus which has now resolved. EEG to evaluate for seizure Level of alertness: comatose AEDs during EEG study: LEV Technical aspects: This EEG was obtained using a 10 lead EEG system positioned circumferentially without any parasagittal coverage (rapid EEG). Computer selected EEG is reviewed as  well as background features and all clinically significant events. Description: Per ICU team, patient was noted to have episodes of brief whole body jerking every 1-2 minutes.  Concomitant EEG showed generalized polyspikes consistent with myoclonic seizures.  In between seizures EEG showed generalized background suppression. Hyperventilation and photic stimulation were not performed.   ABNORMALITY - Myoclonic seizure, generalized - Background suppression, generalized IMPRESSION: Patient was noted to have myoclonic seizures every 1-2 minutes.  Additionally there was evidence of severe to profound diffuse encephalopathy.  In the setting of cardiac arrest, this EEG pattern is suggestive of anoxic/hypoxic brain injury. Can consider video eeg for more detailed evaluation if needed. Janey Meek NP was notified. Priyanka Suzanne Erps   CT HEAD WO CONTRAST ( ) Result Date: 01/06/2024 CLINICAL DATA:  Mental status change, unknown cause. EXAM: CT HEAD WITHOUT CONTRAST TECHNIQUE: Contiguous axial images were obtained from the base of the skull through the vertex without intravenous contrast. RADIATION DOSE REDUCTION: This exam was performed according to the departmental  dose-optimization program which includes automated exposure control, adjustment of the mA and/or kV according to patient size and/or use of iterative reconstruction technique. COMPARISON:  Head CT and MRI 08/11/2022 FINDINGS: Brain: There is no evidence of an acute infarct, intracranial hemorrhage, mass, midline shift, or extra-axial fluid collection. Confluent hypodensities in the cerebral white matter bilaterally are similar to the prior CT and are nonspecific but compatible with severe chronic small vessel ischemic disease. Small chronic infarcts are again seen in the deep cerebral white matter and left cerebellar hemisphere. There is moderate cerebral atrophy. Vascular: Calcified atherosclerosis at the skull base. No hyperdense vessel. Skull: No acute fracture or suspicious lesion. Sinuses/Orbits: Visualized paranasal sinuses are clear. Small chronic left mastoid effusion. Unremarkable orbits. Other: None. IMPRESSION: 1. No evidence of acute intracranial abnormality. 2. Severe chronic small vessel ischemic disease. Electronically Signed   By: Aundra Lee M.D.   On: 01/06/2024 16:22   DG Chest Portable 1 View Result Date: 01/06/2024 CLINICAL DATA:  Post intubation EXAM: PORTABLE CHEST 1 VIEW COMPARISON:  None Available. FINDINGS: Detail limited by overlying leads. An endotracheal tube is present which likely terminates approximately 2 cm above the carina. Bibasilar hypoventilatory changes. No pleural effusion or pneumothorax. IMPRESSION: 1. Satisfactory appearance of endotracheal tube. 2. No pneumothorax. Electronically Signed   By: Reagan Camera M.D.   On: 01/06/2024 14:11   DG Abdomen 1 View Result Date: 01/06/2024 CLINICAL DATA:  G-tube placement EXAM: ABDOMEN - 1 VIEW COMPARISON:  None Available. FINDINGS: An enteric tube is present which terminates into spit location the stomach. Numerous leads overlie the patient. Nonspecific bowel gas pattern. IMPRESSION: 1. An enteric tube is present which likely  terminates in the stomach. Electronically Signed   By: Reagan Camera M.D.   On: 01/06/2024 14:10   ECHOCARDIOGRAM COMPLETE Result Date: 01/06/2024  ECHOCARDIOGRAM REPORT   Patient Name:   [name] Date of Exam: 01/06/2024 Medical Rec #:  [id number]     Height:       65.5 in Accession #:    [id number]    Weight:       260.0 lb Date of Birth:  1961-01-28      BSA:          2.224 m Patient Age:    62 years      BP:           148/85 mmHg Patient Gender: F             HR:           83 bpm. Exam Location:  [facility] Procedure: 2D Echo, Cardiac Doppler and Color Doppler (Both Spectral and Color            Flow Doppler were utilized during procedure). Indications:     CHF I50.31  History:         Patient has no prior history of Echocardiogram examinations.  Sonographer:     Clenton Czech RDCS, FASE Referring Phys:  [id number] Lanetta Pion Diagnosing Phys: Debborah Fairly  Sonographer Comments: Technically challenging study due to limited acoustic windows, suboptimal parasternal window, suboptimal apical window, no subcostal window and patient is obese. Image acquisition challenging due to patient body habitus and Image acquisition challenging due to respiratory motion. Very challenging and difficult study due to several factors: chest wall/ lung interference, patient postioning (performed supine) and body habitus. IMPRESSIONS  1. Left ventricular ejection fraction, by estimation, is 35 to 40%. The left ventricle has moderately decreased function. The left ventricle demonstrates global hypokinesis. The left ventricular internal cavity size was moderately to severely dilated. Left ventricular diastolic parameters are consistent with Grade II diastolic dysfunction (pseudonormalization). The global longitudinal strain is normal.  2. Right ventricular systolic function is mildly reduced. The right ventricular size is moderately enlarged.  3. Left atrial size was moderately dilated.  4. Right atrial size was moderately dilated.   5. The mitral valve was not well visualized. No evidence of mitral valve regurgitation.  6. The aortic valve was not assessed. Aortic valve regurgitation is not visualized. Conclusion(s)/Recommendation(s): Poor windows for evaluation of left ventricular function by transthoracic echocardiography. Would recommend an alternative means of evaluation. FINDINGS  Left Ventricle: Left ventricular ejection fraction, by estimation, is 35 to 40%. The left ventricle has moderately decreased function. The left ventricle demonstrates global hypokinesis. Strain was performed and the global longitudinal strain is normal.  The left ventricular internal cavity size was moderately to severely dilated. There is borderline left ventricular hypertrophy. Left ventricular diastolic parameters are consistent with Grade II diastolic dysfunction (pseudonormalization). Right Ventricle: The right ventricular size is moderately enlarged. Right vetricular wall thickness was not assessed. Right ventricular systolic function is mildly reduced. Left Atrium: Left atrial size was moderately dilated. Right Atrium: Right atrial size was moderately dilated. Pericardium: The pericardium was not assessed. Mitral Valve: The mitral valve was not well visualized. No evidence of mitral valve regurgitation. Tricuspid Valve: The tricuspid valve is not well visualized. Tricuspid valve regurgitation is not demonstrated. Aortic Valve: The aortic valve was not assessed. Aortic valve regurgitation is not visualized. Pulmonic Valve: The pulmonic valve was not well visualized. Pulmonic valve regurgitation is not visualized. Aorta: The aortic root, ascending aorta and aortic arch are all structurally normal, with no evidence of dilitation or obstruction. IAS/Shunts: The interatrial septum was not assessed. Additional  Comments: 3D was performed not requiring image post processing on an independent workstation and was indeterminate. Debborah Fairly Electronically signed by  Debborah Fairly Signature Date/Time: 01/06/2024/12:59:22 PM    Final    CT Angio Chest PE W and/or Wo Contrast Result Date: 01/05/2024 CLINICAL DATA:  Short of breath for 3 days. EXAM: CT ANGIOGRAPHY CHEST WITH CONTRAST TECHNIQUE: Multidetector CT imaging of the chest was performed using the standard protocol during bolus administration of intravenous contrast. Multiplanar CT image reconstructions and MIPs were obtained to evaluate the vascular anatomy. RADIATION DOSE REDUCTION: This exam was performed according to the departmental dose-optimization program which includes automated exposure control, adjustment of the mA and/or kV according to patient size and/or use of iterative reconstruction technique. CONTRAST:  75mL OMNIPAQUE  IOHEXOL  350 MG/ML SOLN COMPARISON:  None Available. FINDINGS: Cardiovascular: No filling defects within the pulmonary arteries to suggest acute pulmonary embolism. No pericardial fluid. Mediastinum/Nodes: No axillary or supraclavicular adenopathy. No mediastinal or hilar adenopathy. No pericardial fluid. Esophagus normal. Lungs/Pleura: Ill-defined ground-glass densities in the upper lobes. RIGHT pleural effusion. Small amount fluid along the RIGHT oblique fissure. RIGHT basilar atelectasis. No pulmonary infarction identified. No pneumonia. Mild interstitial thickening at the lung bases. Upper Abdomen: Enlargement of the LEFT adrenal gland to 25 mm mm. No change from comparison exam where adrenal gland demonstrated density consistent with benign adenoma. Findings consistent benign adenoma. Musculoskeletal: No chest wall abnormality. No acute or significant osseous findings. Review of the MIP images confirms the above findings. IMPRESSION: 1. No evidence acute pulmonary embolism. 2. RIGHT pleural effusion with RIGHT basilar atelectasis. 3. Mild interstitial thickening at the lung bases suggest interstitial edema. 4. Ill-defined ground-glass densities in the upper lobes. Findings suggest mild  pulmonary edema. 5. Benign LEFT adrenal adenoma. Electronically Signed   By: Deboraha Fallow M.D.   On: 01/05/2024 19:10   DG Chest Portable 1 View Result Date: 01/05/2024 CLINICAL DATA:  Shortness of breath EXAM: PORTABLE CHEST 1 VIEW COMPARISON:  None Available. FINDINGS: Some detail limited by body habitus. Lung bases are obscured by overlying soft tissue. Heart is enlarged. IMPRESSION: 1. Detail limited exam without definite plain film evidence of acute process. 2. Enlarged heart. Electronically Signed   By: Reagan Camera M.D.   On: 01/05/2024 16:33    Microbiology Recent Results (from the past 240 hours)  Resp panel by RT-PCR (RSV, Flu A&B, Covid) Anterior Nasal Swab     Status: None   Collection Time: 01/05/24  3:46 PM   Specimen: Anterior Nasal Swab  Result Value Ref Range Status   SARS Coronavirus 2 by RT PCR NEGATIVE NEGATIVE Final    Comment: (NOTE) SARS-CoV-2 target nucleic acids are NOT DETECTED.  The SARS-CoV-2 RNA is generally detectable in upper respiratory specimens during the acute phase of infection. The lowest concentration of SARS-CoV-2 viral copies this assay can detect is 138 copies/mL. A negative result does not preclude SARS-Cov-2 infection and should not be used as the sole basis for treatment or other patient management decisions. A negative result may occur with  improper specimen collection/handling, submission of specimen other than nasopharyngeal swab, presence of viral mutation(s) within the areas targeted by this assay, and inadequate number of viral copies(<138 copies/mL). A negative result must be combined with clinical observations, patient history, and epidemiological information. The expected result is Negative.  Fact Sheet for Patients:  BloggerCourse.com  Fact Sheet for Healthcare Providers:  SeriousBroker.it  This test is no t yet approved or cleared by the [place]  FDA  and  has been  authorized for detection and/or diagnosis of SARS-CoV-2 by FDA under an Emergency Use Authorization (EUA). This EUA will remain  in effect (meaning this test can be used) for the duration of the COVID-19 declaration under Section 564(b)(1) of the Act, 21 U.S.C.section 360bbb-3(b)(1), unless the authorization is terminated  or revoked sooner.       Influenza A by PCR NEGATIVE NEGATIVE Final   Influenza B by PCR NEGATIVE NEGATIVE Final    Comment: (NOTE) The Xpert Xpress SARS-CoV-2/FLU/RSV plus assay is intended as an aid in the diagnosis of influenza from Nasopharyngeal swab specimens and should not be used as a sole basis for treatment. Nasal washings and aspirates are unacceptable for Xpert Xpress SARS-CoV-2/FLU/RSV testing.  Fact Sheet for Patients: BloggerCourse.com  Fact Sheet for Healthcare Providers: SeriousBroker.it  This test is not yet approved or cleared by the [place]  FDA and has been authorized for detection and/or diagnosis of SARS-CoV-2 by FDA under an Emergency Use Authorization (EUA). This EUA will remain in effect (meaning this test can be used) for the duration of the COVID-19 declaration under Section 564(b)(1) of the Act, 21 U.S.C. section 360bbb-3(b)(1), unless the authorization is terminated or revoked.     Resp Syncytial Virus by PCR NEGATIVE NEGATIVE Final    Comment: (NOTE) Fact Sheet for Patients: BloggerCourse.com  Fact Sheet for Healthcare Providers: SeriousBroker.it  This test is not yet approved or cleared by the [place]  FDA and has been authorized for detection and/or diagnosis of SARS-CoV-2 by FDA under an Emergency Use Authorization (EUA). This EUA will remain in effect (meaning this test can be used) for the duration of the COVID-19 declaration under Section 564(b)(1) of the Act, 21 U.S.C. section 360bbb-3(b)(1), unless the  authorization is terminated or revoked.  Performed at [facility], [address] [place], [place] [address]   MRSA Next Gen by PCR, Nasal     Status: None   Collection Time: 01/06/24  3:39 PM   Specimen: Nasal Mucosa; Nasal Swab  Result Value Ref Range Status   MRSA by PCR Next Gen NOT DETECTED NOT DETECTED Final    Comment: (NOTE) The GeneXpert MRSA Assay (FDA approved for NASAL specimens only), is one component of a comprehensive MRSA colonization surveillance program. It is not intended to diagnose MRSA infection nor to guide or monitor treatment for MRSA infections. Test performance is not FDA approved in patients less than 62 years old. Performed at [facility], [address] [place], [place] [address]   Culture, blood (Routine X 2) w Reflex to ID Panel     Status: None   Collection Time: 01/06/24  4:58 PM   Specimen: BLOOD LEFT HAND  Result Value Ref Range Status   Specimen Description BLOOD LEFT HAND  Final   Special Requests   Final    BOTTLES DRAWN AEROBIC AND ANAEROBIC Blood Culture adequate volume   Culture   Final    NO GROWTH 5 DAYS Performed at [facility], [address] [place], [place] [address]    Report Status 01/11/2024 FINAL  Final  Culture, blood (Routine X 2) w Reflex to ID Panel     Status: None   Collection Time: 01/06/24  7:25 PM   Specimen: BLOOD  Result Value Ref Range Status   Specimen Description BLOOD BLOOD LEFT ARM  Final   Special Requests   Final    BOTTLES DRAWN AEROBIC AND ANAEROBIC Blood Culture results may not be optimal  due to an excessive volume of blood received in culture bottles   Culture   Final    NO GROWTH 5 DAYS Performed at [facility], [address] [place] [place], [place] [address]    Report Status 01/11/2024 FINAL  Final    Lab Basic Metabolic Panel: Recent Labs  Lab 01/08/24 0412 01/09/24 0539 01/10/24 0447 01/11/24 0423 Jan 24, 2024 0520  NA 140 140 138 141 139  K 3.4*  3.9 4.3 4.1 3.5  CL 98 98 96* 99 97*  CO2 31 31 30 31  32  GLUCOSE 229* 253* 150* 158* 182*  BUN 45* 42* 52* 62* 59*  CREATININE 1.58* 1.17* 1.20* 1.43* 1.35*  CALCIUM  7.9* 7.7* 7.9* 8.0* 8.0*  MG 2.3 2.2 2.4 2.5* 2.3  PHOS 3.7 2.7 3.2 3.5 2.8   Liver Function Tests: Recent Labs  Lab 01/06/24 1549 01/07/24 0258 01/07/24 0516 01/09/24 0539  AST 101*  --  90* 75*  ALT 84*  --  110* 150*  ALKPHOS 99  --  101 94  BILITOT 1.0  --  0.9 1.0  PROT 6.2*  --  6.4* 5.6*  ALBUMIN 2.7* 2.9* 2.8* 2.3*   No results for input(s): "LIPASE", "AMYLASE" in the last 168 hours. No results for input(s): "AMMONIA" in the last 168 hours. CBC: Recent Labs  Lab 01/08/24 0412 01/09/24 0539 01/10/24 0447 01/11/24 0423 Jan 24, 2024 0520  WBC 26.2* 21.2* 22.1* 21.2* 11.3*  HGB 9.3* 9.4* 9.8* 9.4* 9.1*  HCT 33.7* 35.1* 36.4 34.6* 33.0*  MCV 74.9* 78.0* 77.4* 77.1* 76.4*  PLT 317 366 363 281 238   Cardiac Enzymes: No results for input(s): "CKTOTAL", "CKMB", "CKMBINDEX", "TROPONINI" in the last 168 hours. Sepsis Labs: Recent Labs  Lab 01/06/24 1658 01/07/24 0258 01/07/24 0516 01/08/24 0412 01/09/24 0539 01/10/24 0447 01/11/24 0423 Jan 24, 2024 0520  WBC  --    < > 25.5*   < > 21.2* 22.1* 21.2* 11.3*  LATICACIDVEN 4.9*  --  2.7*  --   --   --   --   --    < > = values in this interval not displayed.    Procedures/Operations  See procedure notes   Alonza Arthurs, DNP, CCRN, FNP-C, AGACNP-BC Acute Care & Family Nurse Practitioner  [facility]  See Amion for personal pager PCCM on call pager [phone] until 7 am

## 2024-01-25 NOTE — Progress Notes (Signed)
Patient extubated to comfort care. Family at bedside.

## 2024-01-25 DEATH — deceased
